# Patient Record
Sex: Male | Born: 1965
Health system: Southern US, Community
[De-identification: ages and names within clinical notes are randomized; demographics above are authoritative.]

## PROBLEM LIST (undated history)

## (undated) DIAGNOSIS — E785 Hyperlipidemia, unspecified: Secondary | ICD-10-CM

## (undated) DIAGNOSIS — E291 Testicular hypofunction: Secondary | ICD-10-CM

## (undated) DIAGNOSIS — M5090 Cervical disc disorder, unspecified, unspecified cervical region: Secondary | ICD-10-CM

## (undated) DIAGNOSIS — I209 Angina pectoris, unspecified: Secondary | ICD-10-CM

## (undated) DIAGNOSIS — R519 Headache, unspecified: Secondary | ICD-10-CM

## (undated) DIAGNOSIS — K219 Gastro-esophageal reflux disease without esophagitis: Secondary | ICD-10-CM

## (undated) DIAGNOSIS — R51 Headache: Secondary | ICD-10-CM

## (undated) DIAGNOSIS — M1990 Unspecified osteoarthritis, unspecified site: Secondary | ICD-10-CM

## (undated) DIAGNOSIS — I499 Cardiac arrhythmia, unspecified: Secondary | ICD-10-CM

## (undated) DIAGNOSIS — G43909 Migraine, unspecified, not intractable, without status migrainosus: Secondary | ICD-10-CM

## (undated) DIAGNOSIS — I10 Essential (primary) hypertension: Secondary | ICD-10-CM

## (undated) DIAGNOSIS — J449 Chronic obstructive pulmonary disease, unspecified: Secondary | ICD-10-CM

## (undated) DIAGNOSIS — R0602 Shortness of breath: Secondary | ICD-10-CM

## (undated) DIAGNOSIS — J45909 Unspecified asthma, uncomplicated: Secondary | ICD-10-CM

## (undated) DIAGNOSIS — E119 Type 2 diabetes mellitus without complications: Secondary | ICD-10-CM

## (undated) DIAGNOSIS — I4892 Unspecified atrial flutter: Secondary | ICD-10-CM

## (undated) HISTORY — DX: Essential (primary) hypertension: I10

## (undated) HISTORY — PX: WISDOM TOOTH EXTRACTION: SHX21

## (undated) HISTORY — DX: Hyperlipidemia, unspecified: E78.5

## (undated) HISTORY — PX: TREATMENT FISTULA ANAL: SUR1390

## (undated) HISTORY — DX: Testicular hypofunction: E29.1

## (undated) HISTORY — DX: Unspecified atrial flutter: I48.92

## (undated) HISTORY — DX: Type 2 diabetes mellitus without complications: E11.9

---
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
## 2000-10-09 ENCOUNTER — Ambulatory Visit (HOSPITAL_COMMUNITY): Admission: RE | Admit: 2000-10-09 | Discharge: 2000-10-09 | Payer: Self-pay | Admitting: *Deleted

## 2000-10-09 ENCOUNTER — Encounter: Payer: Self-pay | Admitting: *Deleted

## 2001-02-22 ENCOUNTER — Ambulatory Visit (HOSPITAL_COMMUNITY): Admission: RE | Admit: 2001-02-22 | Discharge: 2001-02-22 | Payer: Self-pay | Admitting: Family Medicine

## 2001-02-22 ENCOUNTER — Encounter: Payer: Self-pay | Admitting: Family Medicine

## 2003-10-11 ENCOUNTER — Ambulatory Visit (HOSPITAL_COMMUNITY): Admission: RE | Admit: 2003-10-11 | Discharge: 2003-10-11 | Payer: Self-pay | Admitting: Gastroenterology

## 2003-10-27 ENCOUNTER — Ambulatory Visit (HOSPITAL_BASED_OUTPATIENT_CLINIC_OR_DEPARTMENT_OTHER): Admission: RE | Admit: 2003-10-27 | Discharge: 2003-10-27 | Payer: Self-pay | Admitting: Surgery

## 2012-08-17 ENCOUNTER — Encounter (HOSPITAL_COMMUNITY): Payer: Self-pay | Admitting: Pharmacy Technician

## 2012-08-17 ENCOUNTER — Other Ambulatory Visit: Payer: Self-pay | Admitting: Neurosurgery

## 2012-08-23 ENCOUNTER — Encounter (HOSPITAL_COMMUNITY)
Admission: RE | Admit: 2012-08-23 | Discharge: 2012-08-23 | Disposition: A | Discharge status: Home / Self Care | Payer: 59 | Source: Ambulatory Visit | Attending: Neurosurgery | Admitting: Neurosurgery

## 2012-08-25 ENCOUNTER — Encounter (HOSPITAL_COMMUNITY): Admission: RE | Payer: Self-pay | Source: Ambulatory Visit

## 2012-08-25 ENCOUNTER — Ambulatory Visit (HOSPITAL_COMMUNITY): Admission: RE | Admit: 2012-08-25 | Payer: 59 | Source: Ambulatory Visit | Admitting: Neurosurgery

## 2012-08-25 SURGERY — ANTERIOR CERVICAL DECOMPRESSION/DISCECTOMY FUSION 2 LEVELS
Anesthesia: General | Site: Neck

## 2012-08-27 ENCOUNTER — Other Ambulatory Visit: Payer: Self-pay | Admitting: Neurosurgery

## 2012-09-07 ENCOUNTER — Encounter (HOSPITAL_COMMUNITY): Payer: Self-pay | Admitting: Respiratory Therapy

## 2012-09-15 ENCOUNTER — Encounter (HOSPITAL_COMMUNITY): Payer: Self-pay

## 2012-09-15 ENCOUNTER — Encounter (HOSPITAL_COMMUNITY)
Admission: RE | Admit: 2012-09-15 | Discharge: 2012-09-15 | Disposition: A | Discharge status: Home / Self Care | Payer: 59 | Source: Ambulatory Visit | Attending: Neurosurgery | Admitting: Neurosurgery

## 2012-09-15 ENCOUNTER — Ambulatory Visit (HOSPITAL_COMMUNITY)
Admission: RE | Admit: 2012-09-15 | Discharge: 2012-09-15 | Disposition: A | Discharge status: Home / Self Care | Payer: 59 | Source: Ambulatory Visit | Attending: Anesthesiology | Admitting: Anesthesiology

## 2012-09-15 DIAGNOSIS — R9431 Abnormal electrocardiogram [ECG] [EKG]: Secondary | ICD-10-CM | POA: Insufficient documentation

## 2012-09-15 DIAGNOSIS — J4489 Other specified chronic obstructive pulmonary disease: Secondary | ICD-10-CM | POA: Insufficient documentation

## 2012-09-15 DIAGNOSIS — J449 Chronic obstructive pulmonary disease, unspecified: Secondary | ICD-10-CM | POA: Insufficient documentation

## 2012-09-15 DIAGNOSIS — Z01812 Encounter for preprocedural laboratory examination: Secondary | ICD-10-CM | POA: Insufficient documentation

## 2012-09-15 DIAGNOSIS — Z01810 Encounter for preprocedural cardiovascular examination: Secondary | ICD-10-CM | POA: Insufficient documentation

## 2012-09-15 DIAGNOSIS — Z01818 Encounter for other preprocedural examination: Secondary | ICD-10-CM | POA: Insufficient documentation

## 2012-09-15 HISTORY — DX: Chronic obstructive pulmonary disease, unspecified: J44.9

## 2012-09-15 HISTORY — DX: Gastro-esophageal reflux disease without esophagitis: K21.9

## 2012-09-15 LAB — BASIC METABOLIC PANEL
BUN: 14 mg/dL (ref 6–23)
Calcium: 8.8 mg/dL (ref 8.4–10.5)
GFR calc Af Amer: >90 mL/min (ref >90)
GFR calc non Af Amer: >90 mL/min (ref >90)
Potassium: 4 mEq/L (ref 3.5–5.1)

## 2012-09-15 LAB — CBC
Hemoglobin: 15.6 g/dL (ref 13.0–17.0)
MCH: 29.9 pg (ref 26.0–34.0)
MCHC: 37.3 g/dL — ABNORMAL HIGH (ref 30.0–36.0)
Platelets: 228 10*3/uL (ref 150–400)
RDW: 12.4 % (ref 11.5–15.5)

## 2012-09-15 LAB — SURGICAL PCR SCREEN: MRSA, PCR: NEGATIVE

## 2012-09-15 NOTE — Pre-Procedure Instructions (Signed)
BRADIE LACOCK  09/15/2012   Your procedure is scheduled on:  Wednesday, April 9th   Report to [facility] Stay Center at  6:30 AM.  Call this number if you have problems the morning of surgery: [phone]   Remember:   Do not eat food or drink liquids after midnight Tuesday.   Take these medicines the morning of surgery with A SIP OF WATER:  Omeprazole, Inhaler   Do not wear jewelry, no rings, watches, piercings  Do not wear lotions, powders, or colognes. You may NOT wear deodorant.   Men may shave face and neck.   Do not bring valuables to the hospital.  Contacts, dentures or bridgework may not be worn into surgery.   Leave suitcase in the car. After surgery it may be brought to your room.  For patients admitted to the hospital, checkout time is 11:00 AM the day of discharge.   Name and phone number of your driver:    Special Instructions: Shower using CHG 2 nights before surgery and the night before surgery.  If you shower the day of surgery use CHG.  Use special wash - you have one bottle of CHG for all showers.  You should use approximately 1/3 of the bottle for each shower.   Please read over the following fact sheets that you were given: Pain Booklet, Coughing and Deep Breathing, MRSA Information and Surgical Site Infection Prevention

## 2012-09-15 NOTE — Progress Notes (Signed)
Primary Physician - [facility] Does not have a cardiologist  No recent cardiac testing

## 2012-09-15 NOTE — Progress Notes (Signed)
1715  Spoke with Dr. Michelle Piper concerning pt's blood sugar being 212 and no hx of dm...."its ok..we'll check it in the am and run with it....."da

## 2012-09-16 NOTE — Progress Notes (Signed)
Anesthesia chart review: Patient is a 47 year old male scheduled for C5-6, C6-7 ACDF by Dr. Jeral Fruit on 09/22/2012.  History includes obesity, former smoker, COPD, GERD, headaches, anal fistula, wisdom teeth extraction. BP was elevated at PAT (168/98), but there is no documented history of HTN--it was not rechecked at PAT. PCP is with [facility].  EKG on 09/15/12 showed NSR, non-specific T wave abnormality.  CXR on 09/15/12 showed no active cardiopulmonary disease.  Preoperative labs noted.  Non-fasting glucose is 212.  There is no documented history of DM.  Anesthesiologist Dr. Michelle Piper already notified by PAT RN.  Plan to check fasting CBG on arrival and treat if indicated.    Velna Ochs [facility] Stay [facility]/Anesthesiology Phone [phone] 09/16/2012 1:50 PM

## 2012-09-21 MED ORDER — CEFAZOLIN SODIUM-DEXTROSE 2-3 GM-% IV SOLR
2.0000 g | INTRAVENOUS | Status: AC
  Administered 2012-09-22: 2 g via INTRAVENOUS
  Filled 2012-09-21: qty 50

## 2012-09-22 ENCOUNTER — Encounter (HOSPITAL_COMMUNITY): Payer: Self-pay | Admitting: Certified Registered Nurse Anesthetist

## 2012-09-22 ENCOUNTER — Other Ambulatory Visit: Payer: Self-pay | Admitting: Neurosurgery

## 2012-09-22 ENCOUNTER — Encounter (HOSPITAL_COMMUNITY): Payer: Self-pay | Admitting: Vascular Surgery

## 2012-09-22 ENCOUNTER — Ambulatory Visit (HOSPITAL_COMMUNITY): Payer: 59

## 2012-09-22 ENCOUNTER — Observation Stay (HOSPITAL_COMMUNITY)
Admission: RE | Admit: 2012-09-22 | Discharge: 2012-09-23 | DRG: 472 | Disposition: A | Discharge status: Skilled Nursing Facility | Payer: 59 | Source: Ambulatory Visit | Attending: Neurosurgery | Admitting: Neurosurgery

## 2012-09-22 ENCOUNTER — Ambulatory Visit (HOSPITAL_COMMUNITY): Payer: 59 | Admitting: Certified Registered Nurse Anesthetist

## 2012-09-22 ENCOUNTER — Encounter (HOSPITAL_COMMUNITY)
Admission: RE | Disposition: A | Discharge status: Skilled Nursing Facility | Payer: Self-pay | Source: Ambulatory Visit | Attending: Neurosurgery

## 2012-09-22 DIAGNOSIS — Y9210 Unspecified residential institution as the place of occurrence of the external cause: Secondary | ICD-10-CM | POA: Insufficient documentation

## 2012-09-22 DIAGNOSIS — J449 Chronic obstructive pulmonary disease, unspecified: Secondary | ICD-10-CM | POA: Insufficient documentation

## 2012-09-22 DIAGNOSIS — J4489 Other specified chronic obstructive pulmonary disease: Secondary | ICD-10-CM | POA: Insufficient documentation

## 2012-09-22 DIAGNOSIS — Y832 Surgical operation with anastomosis, bypass or graft as the cause of abnormal reaction of the patient, or of later complication, without mention of misadventure at the time of the procedure: Secondary | ICD-10-CM | POA: Insufficient documentation

## 2012-09-22 DIAGNOSIS — M47812 Spondylosis without myelopathy or radiculopathy, cervical region: Principal | ICD-10-CM | POA: Insufficient documentation

## 2012-09-22 DIAGNOSIS — I4892 Unspecified atrial flutter: Secondary | ICD-10-CM

## 2012-09-22 DIAGNOSIS — Z87891 Personal history of nicotine dependence: Secondary | ICD-10-CM | POA: Insufficient documentation

## 2012-09-22 DIAGNOSIS — M4802 Spinal stenosis, cervical region: Secondary | ICD-10-CM | POA: Insufficient documentation

## 2012-09-22 DIAGNOSIS — G43909 Migraine, unspecified, not intractable, without status migrainosus: Secondary | ICD-10-CM | POA: Insufficient documentation

## 2012-09-22 DIAGNOSIS — K219 Gastro-esophageal reflux disease without esophagitis: Secondary | ICD-10-CM | POA: Insufficient documentation

## 2012-09-22 DIAGNOSIS — I519 Heart disease, unspecified: Secondary | ICD-10-CM | POA: Insufficient documentation

## 2012-09-22 DIAGNOSIS — M5020 Other cervical disc displacement, unspecified cervical region: Secondary | ICD-10-CM | POA: Insufficient documentation

## 2012-09-22 HISTORY — DX: Cervical disc disorder, unspecified, unspecified cervical region: M50.90

## 2012-09-22 HISTORY — PX: ANTERIOR CERVICAL DECOMP/DISCECTOMY FUSION: SHX1161

## 2012-09-22 LAB — BASIC METABOLIC PANEL
CO2: 25 mEq/L (ref 19–32)
Calcium: 8.8 mg/dL (ref 8.4–10.5)
GFR calc Af Amer: >90 mL/min (ref >90)
Sodium: 135 mEq/L (ref 135–145)

## 2012-09-22 LAB — GLUCOSE, CAPILLARY: Glucose-Capillary: 172 mg/dL — ABNORMAL HIGH (ref 70–99)

## 2012-09-22 LAB — MAGNESIUM: Magnesium: 1.2 mg/dL — ABNORMAL LOW (ref 1.5–2.5)

## 2012-09-22 LAB — TSH: TSH: 0.51 u[IU]/mL (ref 0.350–4.500)

## 2012-09-22 SURGERY — ANTERIOR CERVICAL DECOMPRESSION/DISCECTOMY FUSION 2 LEVELS
Anesthesia: General | Wound class: Clean

## 2012-09-22 MED ORDER — THROMBIN 20000 UNITS EX SOLR
CUTANEOUS | Status: DC | PRN
  Administered 2012-09-22: 08:00:00 via TOPICAL

## 2012-09-22 MED ORDER — SODIUM CHLORIDE 0.9 % IV SOLN
INTRAVENOUS | Status: DC
  Administered 2012-09-22: 17:00:00 via INTRAVENOUS

## 2012-09-22 MED ORDER — METOPROLOL TARTRATE 25 MG PO TABS
25.0000 mg | ORAL_TABLET | Freq: Four times a day (QID) | ORAL | Status: DC
  Administered 2012-09-22 – 2012-09-23 (×2): 25 mg via ORAL
  Filled 2012-09-22 (×6): qty 1

## 2012-09-22 MED ORDER — MAGNESIUM SULFATE 40 MG/ML IJ SOLN
2.0000 g | Freq: Once | INTRAMUSCULAR | Status: AC
  Administered 2012-09-22: 2 g via INTRAVENOUS
  Filled 2012-09-22: qty 50

## 2012-09-22 MED ORDER — SODIUM CHLORIDE 0.9 % IV SOLN: 250.0000 mL | INTRAVENOUS | Status: DC

## 2012-09-22 MED ORDER — NEOSTIGMINE METHYLSULFATE 1 MG/ML IJ SOLN
INTRAMUSCULAR | Status: DC | PRN
  Administered 2012-09-22: 4 mg via INTRAVENOUS

## 2012-09-22 MED ORDER — PROPOFOL 10 MG/ML IV BOLUS
INTRAVENOUS | Status: DC | PRN
  Administered 2012-09-22: 200 mg via INTRAVENOUS

## 2012-09-22 MED ORDER — MENTHOL 3 MG MT LOZG
1.0000 | LOZENGE | OROMUCOSAL | Status: DC | PRN
  Filled 2012-09-22: qty 9

## 2012-09-22 MED ORDER — MORPHINE SULFATE 2 MG/ML IJ SOLN: 1.0000 mg | INTRAMUSCULAR | Status: DC | PRN

## 2012-09-22 MED ORDER — ESMOLOL HCL 10 MG/ML IV SOLN
INTRAVENOUS | Status: DC | PRN
  Administered 2012-09-22: 30 mg via INTRAVENOUS

## 2012-09-22 MED ORDER — LABETALOL HCL 5 MG/ML IV SOLN
INTRAVENOUS | Status: AC
  Administered 2012-09-22: 10 mg
  Filled 2012-09-22: qty 4

## 2012-09-22 MED ORDER — CEFAZOLIN SODIUM 1-5 GM-% IV SOLN
1.0000 g | Freq: Three times a day (TID) | INTRAVENOUS | Status: AC
  Administered 2012-09-22 – 2012-09-23 (×2): 1 g via INTRAVENOUS
  Filled 2012-09-22 (×2): qty 50

## 2012-09-22 MED ORDER — DILTIAZEM LOAD VIA INFUSION
10.0000 mg | Freq: Once | INTRAVENOUS | Status: AC
  Administered 2012-09-22: 10 mg via INTRAVENOUS

## 2012-09-22 MED ORDER — HYDROMORPHONE HCL PF 1 MG/ML IJ SOLN: 0.2500 mg | INTRAMUSCULAR | Status: DC | PRN

## 2012-09-22 MED ORDER — LABETALOL HCL 5 MG/ML IV SOLN
5.0000 mg | INTRAVENOUS | Status: DC | PRN
  Administered 2012-09-22: 20 mg via INTRAVENOUS
  Administered 2012-09-22 (×2): 10 mg via INTRAVENOUS

## 2012-09-22 MED ORDER — OXYCODONE HCL 5 MG/5ML PO SOLN: 5.0000 mg | Freq: Once | ORAL | Status: DC | PRN

## 2012-09-22 MED ORDER — LIDOCAINE HCL 4 % MT SOLN
OROMUCOSAL | Status: DC | PRN
  Administered 2012-09-22: 4 mL via TOPICAL

## 2012-09-22 MED ORDER — LIDOCAINE HCL (CARDIAC) 20 MG/ML IV SOLN
INTRAVENOUS | Status: DC | PRN
  Administered 2012-09-22: 100 mg via INTRAVENOUS

## 2012-09-22 MED ORDER — DIAZEPAM 5 MG PO TABS
5.0000 mg | ORAL_TABLET | Freq: Four times a day (QID) | ORAL | Status: DC | PRN
  Administered 2012-09-22 (×2): 5 mg via ORAL
  Filled 2012-09-22: qty 1

## 2012-09-22 MED ORDER — FENTANYL CITRATE 0.05 MG/ML IJ SOLN
INTRAMUSCULAR | Status: DC | PRN
  Administered 2012-09-22 (×6): 50 ug via INTRAVENOUS

## 2012-09-22 MED ORDER — ACETAMINOPHEN 325 MG PO TABS: 650.0000 mg | ORAL_TABLET | ORAL | Status: DC | PRN

## 2012-09-22 MED ORDER — DEXAMETHASONE SODIUM PHOSPHATE 4 MG/ML IJ SOLN
4.0000 mg | Freq: Four times a day (QID) | INTRAMUSCULAR | Status: DC
  Filled 2012-09-22 (×7): qty 1

## 2012-09-22 MED ORDER — PHENOL 1.4 % MT LIQD
1.0000 | OROMUCOSAL | Status: DC | PRN
  Filled 2012-09-22: qty 177

## 2012-09-22 MED ORDER — 0.9 % SODIUM CHLORIDE (POUR BTL) OPTIME
TOPICAL | Status: DC | PRN
  Administered 2012-09-22: 1000 mL

## 2012-09-22 MED ORDER — OXYCODONE-ACETAMINOPHEN 5-325 MG PO TABS
ORAL_TABLET | ORAL | Status: AC
  Filled 2012-09-22: qty 2

## 2012-09-22 MED ORDER — SODIUM CHLORIDE 0.9 % IJ SOLN: 3.0000 mL | Freq: Two times a day (BID) | INTRAMUSCULAR | Status: DC

## 2012-09-22 MED ORDER — ONDANSETRON HCL 4 MG/2ML IJ SOLN: 4.0000 mg | INTRAMUSCULAR | Status: DC | PRN

## 2012-09-22 MED ORDER — DIAZEPAM 5 MG PO TABS
ORAL_TABLET | ORAL | Status: AC
  Filled 2012-09-22: qty 1

## 2012-09-22 MED ORDER — DEXAMETHASONE 4 MG PO TABS
4.0000 mg | ORAL_TABLET | Freq: Four times a day (QID) | ORAL | Status: DC
  Administered 2012-09-22 – 2012-09-23 (×4): 4 mg via ORAL
  Filled 2012-09-22 (×7): qty 1

## 2012-09-22 MED ORDER — ROCURONIUM BROMIDE 100 MG/10ML IV SOLN
INTRAVENOUS | Status: DC | PRN
  Administered 2012-09-22: 50 mg via INTRAVENOUS

## 2012-09-22 MED ORDER — OXYCODONE-ACETAMINOPHEN 5-325 MG PO TABS
1.0000 | ORAL_TABLET | ORAL | Status: DC | PRN
  Administered 2012-09-22 (×3): 2 via ORAL
  Filled 2012-09-22 (×2): qty 1
  Filled 2012-09-22: qty 2

## 2012-09-22 MED ORDER — LACTATED RINGERS IV SOLN
INTRAVENOUS | Status: DC | PRN
  Administered 2012-09-22 (×2): via INTRAVENOUS

## 2012-09-22 MED ORDER — METOCLOPRAMIDE HCL 5 MG/ML IJ SOLN: 10.0000 mg | Freq: Once | INTRAMUSCULAR | Status: DC | PRN

## 2012-09-22 MED ORDER — SODIUM CHLORIDE 0.9 % IJ SOLN: 3.0000 mL | INTRAMUSCULAR | Status: DC | PRN

## 2012-09-22 MED ORDER — HEMOSTATIC AGENTS (NO CHARGE) OPTIME
TOPICAL | Status: DC | PRN
  Administered 2012-09-22: 1 via TOPICAL

## 2012-09-22 MED ORDER — ONDANSETRON HCL 4 MG/2ML IJ SOLN
INTRAMUSCULAR | Status: DC | PRN
  Administered 2012-09-22: 4 mg via INTRAVENOUS

## 2012-09-22 MED ORDER — DILTIAZEM HCL 100 MG IV SOLR
5.0000 mg/h | INTRAVENOUS | Status: DC
  Administered 2012-09-22: 5 mg/h via INTRAVENOUS
  Administered 2012-09-22 – 2012-09-23 (×3): 15 mg/h via INTRAVENOUS
  Filled 2012-09-22 (×3): qty 100

## 2012-09-22 MED ORDER — GLYCOPYRROLATE 0.2 MG/ML IJ SOLN
INTRAMUSCULAR | Status: DC | PRN
  Administered 2012-09-22: .6 mg via INTRAVENOUS

## 2012-09-22 MED ORDER — MIDAZOLAM HCL 5 MG/5ML IJ SOLN
INTRAMUSCULAR | Status: DC | PRN
  Administered 2012-09-22: 2 mg via INTRAVENOUS

## 2012-09-22 MED ORDER — THROMBIN 5000 UNITS EX SOLR
CUTANEOUS | Status: DC | PRN
  Administered 2012-09-22: 5000 [IU] via TOPICAL

## 2012-09-22 MED ORDER — VECURONIUM BROMIDE 10 MG IV SOLR
INTRAVENOUS | Status: DC | PRN
  Administered 2012-09-22 (×2): 2 mg via INTRAVENOUS

## 2012-09-22 MED ORDER — ACETAMINOPHEN 650 MG RE SUPP: 650.0000 mg | RECTAL | Status: DC | PRN

## 2012-09-22 MED ORDER — OXYCODONE HCL 5 MG PO TABS: 5.0000 mg | ORAL_TABLET | Freq: Once | ORAL | Status: DC | PRN

## 2012-09-22 SURGICAL SUPPLY — 53 items
BANDAGE GAUZE ELAST BULKY 4 IN (GAUZE/BANDAGES/DRESSINGS) ×4 IMPLANT
BENZOIN TINCTURE PRP APPL 2/3 (GAUZE/BANDAGES/DRESSINGS) ×2 IMPLANT
BIT DRILL SM SPINE QC 14 (BIT) ×2 IMPLANT
BLADE ULTRA TIP 2M (BLADE) ×2 IMPLANT
BUR BARREL STRAIGHT FLUTE 4.0 (BURR) IMPLANT
BUR MATCHSTICK NEURO 3.0 LAGG (BURR) ×2 IMPLANT
CANISTER SUCTION 2500CC (MISCELLANEOUS) ×2 IMPLANT
CLOTH BEACON ORANGE TIMEOUT ST (SAFETY) ×2 IMPLANT
CONT SPEC 4OZ CLIKSEAL STRL BL (MISCELLANEOUS) ×2 IMPLANT
COVER MAYO STAND STRL (DRAPES) ×2 IMPLANT
DRAPE C-ARM 42X72 X-RAY (DRAPES) ×4 IMPLANT
DRAPE LAPAROTOMY 100X72 PEDS (DRAPES) ×2 IMPLANT
DRAPE MICROSCOPE LEICA (MISCELLANEOUS) ×2 IMPLANT
DRAPE POUCH INSTRU U-SHP 10X18 (DRAPES) ×2 IMPLANT
DRAPE PROXIMA HALF (DRAPES) ×2 IMPLANT
DURAPREP 6ML APPLICATOR 50/CS (WOUND CARE) ×2 IMPLANT
ELECT BLADE 4.0 EZ CLEAN MEGAD (MISCELLANEOUS) ×2
ELECT REM PT RETURN 9FT ADLT (ELECTROSURGICAL) ×2
ELECTRODE BLDE 4.0 EZ CLN MEGD (MISCELLANEOUS) ×1 IMPLANT
ELECTRODE REM PT RTRN 9FT ADLT (ELECTROSURGICAL) ×1 IMPLANT
GAUZE SPONGE 4X4 16PLY XRAY LF (GAUZE/BANDAGES/DRESSINGS) IMPLANT
GLOVE BIOGEL M 8.0 STRL (GLOVE) ×2 IMPLANT
GLOVE ECLIPSE 6.5 STRL STRAW (GLOVE) ×2 IMPLANT
GLOVE ECLIPSE 7.5 STRL STRAW (GLOVE) ×6 IMPLANT
GLOVE EXAM NITRILE LRG STRL (GLOVE) IMPLANT
GLOVE EXAM NITRILE MD LF STRL (GLOVE) IMPLANT
GLOVE EXAM NITRILE XL STR (GLOVE) IMPLANT
GLOVE EXAM NITRILE XS STR PU (GLOVE) IMPLANT
GLOVE INDICATOR 8.0 STRL GRN (GLOVE) ×2 IMPLANT
GOWN BRE IMP SLV AUR LG STRL (GOWN DISPOSABLE) ×2 IMPLANT
GOWN BRE IMP SLV AUR XL STRL (GOWN DISPOSABLE) ×2 IMPLANT
GOWN STRL REIN 2XL LVL4 (GOWN DISPOSABLE) IMPLANT
HEAD HALTER (SOFTGOODS) ×2 IMPLANT
HEMOSTAT POWDER KIT SURGIFOAM (HEMOSTASIS) ×2 IMPLANT
KIT BASIN OR (CUSTOM PROCEDURE TRAY) ×2 IMPLANT
KIT ROOM TURNOVER OR (KITS) ×2 IMPLANT
NEEDLE SPNL 22GX3.5 QUINCKE BK (NEEDLE) ×4 IMPLANT
NS IRRIG 1000ML POUR BTL (IV SOLUTION) ×2 IMPLANT
PACK LAMINECTOMY NEURO (CUSTOM PROCEDURE TRAY) ×2 IMPLANT
PATTIES SURGICAL .5 X1 (DISPOSABLE) ×2 IMPLANT
PUTTY BONE GRAFT KIT 2.5ML (Bone Implant) ×2 IMPLANT
RUBBERBAND STERILE (MISCELLANEOUS) ×4 IMPLANT
SCREW XTD VAR 4.2 SELF TAP (Screw) ×12 IMPLANT
SPACER ACDF SM LORDOTIC 7 (Spacer) ×4 IMPLANT
SPONGE GAUZE 4X4 12PLY (GAUZE/BANDAGES/DRESSINGS) ×2 IMPLANT
SPONGE INTESTINAL PEANUT (DISPOSABLE) ×2 IMPLANT
SPONGE SURGIFOAM ABS GEL SZ50 (HEMOSTASIS) ×2 IMPLANT
STRIP CLOSURE SKIN 1/2X4 (GAUZE/BANDAGES/DRESSINGS) ×2 IMPLANT
SUT VIC AB 3-0 SH 8-18 (SUTURE) ×4 IMPLANT
SYR 20ML ECCENTRIC (SYRINGE) ×2 IMPLANT
TOWEL OR 17X24 6PK STRL BLUE (TOWEL DISPOSABLE) ×2 IMPLANT
TOWEL OR 17X26 10 PK STRL BLUE (TOWEL DISPOSABLE) ×2 IMPLANT
WATER STERILE IRR 1000ML POUR (IV SOLUTION) ×2 IMPLANT

## 2012-09-22 NOTE — Transfer of Care (Signed)
Immediate Anesthesia Transfer of Care Note  Patient: [name]  Procedure(s) Performed: Procedure(s) with comments: ANTERIOR CERVICAL DECOMPRESSION/DISCECTOMY FUSION 2 LEVELS (N/A) - Cervical five-six Cervical six-seven Anterior cervical decompression/diskectomy/fusion  Patient Location: PACU  Anesthesia Type:General  Level of Consciousness: awake, alert  and patient cooperative, denies pain.   Airway & Oxygen Therapy: Patient Spontanous Breathing and Patient connected to nasal cannula oxygen  Post-op Assessment: Report given to PACU RN, Post -op Vital signs reviewed and stable and Patient moving all extremities X 4  Post vital signs: Reviewed and stable, pt in stable SVT 120's.  Complications: No apparent anesthesia complications

## 2012-09-22 NOTE — Anesthesia Preprocedure Evaluation (Signed)
Anesthesia Evaluation  Patient identified by MRN, date of birth, ID band Patient awake    Reviewed: Allergy & Precautions, H&P , NPO status , Patient's Chart, lab work & pertinent test results, reviewed documented beta blocker date and time   Airway Mallampati: II TM Distance: >3 FB Neck ROM: full    Dental   Pulmonary COPD COPD inhaler, former smoker,  breath sounds clear to auscultation        Cardiovascular negative cardio ROS  Rhythm:regular     Neuro/Psych  Headaches, negative psych ROS   GI/Hepatic Neg liver ROS, GERD-  Medicated and Controlled,  Endo/Other  negative endocrine ROS  Renal/GU negative Renal ROS  negative genitourinary   Musculoskeletal   Abdominal   Peds  Hematology negative hematology ROS (+)   Anesthesia Other Findings See surgeon's H&P   Reproductive/Obstetrics negative OB ROS                           Anesthesia Physical Anesthesia Plan  ASA: II  Anesthesia Plan: General   Post-op Pain Management:    Induction: Intravenous  Airway Management Planned: Oral ETT  Additional Equipment:   Intra-op Plan:   Post-operative Plan: Extubation in OR  Informed Consent: I have reviewed the patients History and Physical, chart, labs and discussed the procedure including the risks, benefits and alternatives for the proposed anesthesia with the patient or authorized representative who has indicated his/her understanding and acceptance.   Dental Advisory Given  Plan Discussed with: CRNA and Surgeon  Anesthesia Plan Comments:         Anesthesia Quick Evaluation

## 2012-09-22 NOTE — H&P (Signed)
Benjamin Hale is an 47 y.o. male.   Chief Complaint: pain and tingling in both hands as well as migraines.neurontin has beenome help. Mri showed a large spondylosis at c67 with cord displacement and foraminal narrowing at c56. HPI: in view of no improvement he wants to go ahead with surgery. emg was negative.  Past Medical History  Diagnosis Date  . COPD (chronic obstructive pulmonary disease)   . GERD (gastroesophageal reflux disease)   . Headache     migraines    Past Surgical History  Procedure Laterality Date  . Treatment fistula anal    . Wisdom tooth extraction      No family history on file. Social History:  reports that he has quit smoking. He does not have any smokeless tobacco history on file. He reports that he does not drink alcohol or use illicit drugs.  Allergies: No Known Allergies  Medications Prior to Admission  Medication Sig Dispense Refill  . albuterol (PROVENTIL HFA;VENTOLIN HFA) 108 (90 BASE) MCG/ACT inhaler Inhale 2 puffs into the lungs every 6 (six) hours as needed for wheezing.      Marland Kitchen ibuprofen (ADVIL,MOTRIN) 200 MG tablet Take 800 mg by mouth every 6 (six) hours as needed for pain.      Marland Kitchen omeprazole (PRILOSEC) 20 MG capsule Take 20 mg by mouth daily as needed. Acid reflux.        Results for orders placed during the hospital encounter of 09/22/12 (from the past 48 hour(s))  GLUCOSE, CAPILLARY     Status: Abnormal   Collection Time    09/22/12  7:45 AM      Result Value Range   Glucose-Capillary 172 (*) 70 - 99 mg/dL   No results found.  Review of Systems  Constitutional: Negative.   HENT: Positive for neck pain.   Eyes: Negative.   Respiratory: Negative.   Cardiovascular: Negative.        Cancer of lung  Gastrointestinal: Negative.   Genitourinary: Negative.   Skin: Negative.   Neurological: Positive for sensory change and focal weakness.  Endo/Heme/Allergies: Negative.   Psychiatric/Behavioral: Negative.     Blood pressure 149/89,  pulse 73, temperature 97.8 F (36.6 C), temperature source Oral, resp. rate 20, SpO2 98.00%. Physical Exam hent, nl. Neck, pain with mobility.cv, nl. Lungs, clear. Abdomen,soft. extremies nl NEUROWEAKNESS OF RIGHT biceps and wrist extensor.also weakness of hypothenar. tenderneassin elbow with sensory changes in the 4 -5 fingers. Assessment/Plan Patient to have anterior cervical decompression and fusion at 56, 67. Aware of risks and benefits [name] M 09/22/2012, 8:10 AM

## 2012-09-22 NOTE — Anesthesia Procedure Notes (Signed)
Procedure Name: Intubation Date/Time: 09/22/2012 6:39 PM Performed by: Angelica Pou Pre-anesthesia Checklist: Patient identified, Timeout performed, Emergency Drugs available, Suction available and Patient being monitored Patient Re-evaluated:Patient Re-evaluated prior to inductionOxygen Delivery Method: Circle system utilized Preoxygenation: Pre-oxygenation with 100% oxygen Intubation Type: IV induction Ventilation: Mask ventilation without difficulty and Oral airway inserted - appropriate to patient size Laryngoscope Size: Mac and 3 Grade View: Grade I Tube type: Oral Tube size: 7.5 mm Number of attempts: 1 Airway Equipment and Method: Stylet and Oral airway Placement Confirmation: ETT inserted through vocal cords under direct vision,  breath sounds checked- equal and bilateral and positive ETCO2 Secured at: 23 cm Tube secured with: Tape Dental Injury: Teeth and Oropharynx as per pre-operative assessment

## 2012-09-22 NOTE — Anesthesia Postprocedure Evaluation (Signed)
Anesthesia Post Note  Patient: Benjamin Hale  Procedure(s) Performed: Procedure(s) (LRB): ANTERIOR CERVICAL DECOMPRESSION/DISCECTOMY FUSION 2 LEVELS (N/A)  Anesthesia type: General  Patient location: PACU  Post pain: Pain level controlled  Post assessment: Patient's Cardiovascular Status- see below  Last Vitals:  Filed Vitals:   09/22/12 1310  BP: 136/81  Pulse: 125  Temp:   Resp: 17    Post vital signs: Reviewed, Patient referred for treatment of persistent atrial flutter. Post-op follow up per cardiology.  Level of consciousness: alert  Complications: No apparent anesthesia complications

## 2012-09-22 NOTE — Progress Notes (Signed)
Utilization review completed.  

## 2012-09-22 NOTE — Preoperative (Signed)
Beta Blockers   Reason not to administer Beta Blockers:Not Applicable 

## 2012-09-22 NOTE — Progress Notes (Signed)
Dr Justin Mend at bedside, aware of pts HRate  Up and in SVT, will watch pt. For 30 minutes per order

## 2012-09-22 NOTE — Progress Notes (Signed)
Dr.Fredrick aware pt still with increased heart rate , Dr. Jens Som paged to see pt from cardiology

## 2012-09-22 NOTE — Consult Note (Signed)
CARDIOLOGY CONSULT NOTE  Patient ID: Benjamin Hale MRN: [id number], DOB/AGE: 11-29-1965   Admit date: 09/22/2012 Date of Consult: 09/22/2012  Primary Physician: D. Christell Constant, MD - [place] Primary Cardiologist: New to [facility] - seen by B. Crenshaw, MD - pt lives in [place]  Pt. Profile  47 y/o male without prior cardiac history whom we've been asked to eval 2/2 aflutter with rvr.  Problem List  Past Medical History  Diagnosis Date  . COPD (chronic obstructive pulmonary disease)   . GERD (gastroesophageal reflux disease)   . Headache     migraines  . Cervical disc disease     Past Surgical History  Procedure Laterality Date  . Treatment fistula anal    . Wisdom tooth extraction    . Anterior cervical decompression/discectomy fusion 2 levels  09/2012     Allergies  No Known Allergies  HPI   47 y/o male with the above problem list.  He has no cardiac history.  He previously smoked and drank heavily but quit both within the past 7-8 yrs.  He has been told that his BP has been running on the high side by his PCP but has not been started on medication.  He has no h/o chest pain, doe, pnd, orthopnea, palpitations, presyncope, or syncope.  He does have a h/o neck pain and has been eval by Dr. Jeral Fruit for cervical spondylosis.  Pt underwent cervical decompression and discectomy this AM and post-op was noted to be tachycardic on the monitor.  He was asymptomatic.  12 lead ecg shows 2:1 atrial flutter.  Labs are pending.  Inpatient Medications  . diazepam      . oxyCODONE-acetaminophen       Family History Family History  Problem Relation Age of Onset  . Lung cancer Mother     died @ 98  . Brain cancer Father     died @ 50    Social History History   Social History  . Marital Status: Married    Spouse Name: N/A    Number of Children: N/A  . Years of Education: N/A   Occupational History  . Not on file.   Social History Main Topics  . Smoking status: Former Smoker --  2.00 packs/day for 20 years    Quit date: 06/16/2005  . Smokeless tobacco: Not on file  . Alcohol Use: No     Comment: previously drank heavily - quit 8 yrs ago.  . Drug Use: No  . Sexually Active: Yes   Other Topics Concern  . Not on file   Social History Narrative   Lives in [place] with his wife and 73 yr old son.  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    Review of Systems  General:  No chills, fever, night sweats or weight changes.  Cardiovascular:  No chest pain, dyspnea on exertion, edema, orthopnea, palpitations, paroxysmal nocturnal dyspnea. Dermatological: No rash, lesions/masses Respiratory: No cough, dyspnea Urologic: No hematuria, dysuria Abdominal:   No nausea, vomiting, diarrhea, bright red blood per rectum, melena, or hematemesis Neurologic:  In setting of disc dzs, he has had migraines and paresthesias in his hands.  No visual changes, wkns, changes in mental status. MSK:  Acutely, neck hurts/"feels stretched" in post-op setting. All other systems reviewed and are otherwise negative except as noted above.  Physical Exam  Blood pressure 136/81, pulse 125, temperature 98 F (36.7 C), temperature source Oral, resp. rate 17, SpO2 95.00%.  General: Pleasant,  NAD Psych: Normal affect. Neuro: Alert and oriented X 3. Moves all extremities spontaneously. HEENT: Normal  Neck: Supple without bruits or JVD. Lungs:  Resp regular and unlabored, CTA. Heart: RRR, tachy, no s3, s4, or murmurs. Abdomen: Soft, non-tender, non-distended, BS + x 4.  Extremities: No clubbing, cyanosis or edema. DP/PT/Radials 2+ and equal bilaterally.  Labs  Bmet, Mg, TSH pending.  Radiology/Studies  Dg Chest 2 View  09/15/2012  *RADIOLOGY REPORT*  Clinical Data: Preop neck surgery  CHEST - 2 VIEW   IMPRESSION: No active cardiopulmonary disease.   Original Report Authenticated By: Janeece Riggers, M.D.    Dg Cervical Spine Complete  09/22/2012  *RADIOLOGY REPORT*  Clinical  Data: ACDF C5 - C6; C6 - C7  CERVICAL SPINE - COMPLETE 4+ VIEW  Comparison: Cervical spine radiographs - 07/26/2012; cervical spine MRI - 08/05/2012  Findings:   IMPRESSION: Post C5 - C6 and C6 - C7 ACDF and intervertebral disc space replacement.   Original Report Authenticated By: Tacey Ruiz, MD    ECG  Aflutter, 126, no acute st/t changes.  ASSESSMENT AND PLAN  1.  Atrial flutter w/ rvr:  Currently asymptomatic with rate of 126 in post-op setting.  He will require a tele bed and we will check lytes, Mg, TSH, and echo.  Add IV dilt for rate control and hopefully he will convert this afternoon.  If not, we will pursue cardioversion in the AM.  Being s/p cervical disc surgery, he is not an ideal candidate for anticoagulation at this time.  CHA2DS2VASc = 0 (though he says BP has been trending up recently).  2.  Cervical disc dzs s/p decompression/discectomy:  Per NSU.  Signed, Nicolasa Ducking, NP 09/22/2012, 1:45 PM  As above, patient seen and examined. Briefly he is a 47 year old male with past medical history of borderline hypertension, COPD and now status post neck surgery who I last evaluated for new-onset atrial flutter. No cardiac history. Had cervical disc surgery today and postoperatively developed atrial flutter with a rapid ventricular response. Patient denies chest pain, palpitations or dyspnea. Plan echocardiogram and TSH. Atrial flutter most likely related to hyperadrenergic state postoperatively. Begin Cardizem for rate control. Hopefully he will convert on his own. If he doesn't we will most likely proceed with cardioversion tomorrow morning. I will not add anticoagulant at this point given his recent surgery. If he converts and holds sinus rhythm I do not think he will require aspirin or anticoagulation long-term.  Olga Millers 2:09 PM

## 2012-09-22 NOTE — Progress Notes (Unsigned)
Op note [phone]

## 2012-09-23 ENCOUNTER — Encounter (HOSPITAL_COMMUNITY): Payer: Self-pay | Admitting: Anesthesiology

## 2012-09-23 ENCOUNTER — Inpatient Hospital Stay (HOSPITAL_COMMUNITY): Payer: 59 | Admitting: Anesthesiology

## 2012-09-23 ENCOUNTER — Encounter (HOSPITAL_COMMUNITY)
Admission: RE | Disposition: A | Discharge status: Skilled Nursing Facility | Payer: Self-pay | Source: Ambulatory Visit | Attending: Neurosurgery

## 2012-09-23 DIAGNOSIS — I517 Cardiomegaly: Secondary | ICD-10-CM

## 2012-09-23 DIAGNOSIS — I4892 Unspecified atrial flutter: Secondary | ICD-10-CM

## 2012-09-23 HISTORY — PX: CARDIOVERSION: SHX1299

## 2012-09-23 LAB — MAGNESIUM: Magnesium: 2.1 mg/dL (ref 1.5–2.5)

## 2012-09-23 SURGERY — CARDIOVERSION
Anesthesia: General | Wound class: Clean

## 2012-09-23 MED ORDER — SODIUM CHLORIDE 0.9 % IJ SOLN: 3.0000 mL | INTRAMUSCULAR | Status: DC | PRN

## 2012-09-23 MED ORDER — PROPOFOL 10 MG/ML IV BOLUS
INTRAVENOUS | Status: DC | PRN
  Administered 2012-09-23: 100 mg via INTRAVENOUS

## 2012-09-23 MED ORDER — DILTIAZEM HCL ER COATED BEADS 180 MG PO CP24
180.0000 mg | ORAL_CAPSULE | Freq: Every day | ORAL | Status: DC
  Administered 2012-09-23: 180 mg via ORAL
  Filled 2012-09-23: qty 1

## 2012-09-23 MED ORDER — SODIUM CHLORIDE 0.9 % IV SOLN
250.0000 mL | INTRAVENOUS | Status: DC
  Administered 2012-09-23: 250 mL via INTRAVENOUS

## 2012-09-23 MED ORDER — OFF THE BEAT BOOK
Freq: Once | Status: AC
  Administered 2012-09-23: 08:00:00
  Filled 2012-09-23: qty 1

## 2012-09-23 MED ORDER — SODIUM CHLORIDE 0.9 % IJ SOLN: 3.0000 mL | Freq: Two times a day (BID) | INTRAMUSCULAR | Status: DC

## 2012-09-23 MED ORDER — DILTIAZEM HCL ER COATED BEADS 180 MG PO CP24: 180.0000 mg | ORAL_CAPSULE | Freq: Every day | ORAL | Status: DC

## 2012-09-23 NOTE — Progress Notes (Signed)
Reviewed discharge instructions with patient and wife, they stated their understanding.  Patient discharged home with wife.  Benjamin Hale Danielle  

## 2012-09-23 NOTE — Progress Notes (Signed)
Patient ID: Benjamin Hale, male   DOB: 12-Jun-1966, 47 y.o.   MRN: [id number] Doing wel,l from the standpoint of cervical spine. No weakness. Wound dry. For cardioversion this am

## 2012-09-23 NOTE — Progress Notes (Signed)
Pt. HR sustaining Aflutter 130's-140's. Hurman Horn PA notified.  Order received for 10 mg IV cardizem bolus and to continue cardizem drip @15mg /hr.  Pt. Mg. level also noted to be 1.2 and PA notified of this as well.  Order received for IV magnesium.  BP 152/62 HR 132 O2 sat 94%RA RR 20.  Pt. Denies any SOB, CP or palpitations at this time.  After cardizem bolus patient's HR decreased to 90's.  Will continue to monitor patient.

## 2012-09-23 NOTE — Progress Notes (Signed)
Pt HR maintaining 90-100's after metoprolol given.  BP 143/72 O2 92%RA RR 18.  Will continue to monitor.

## 2012-09-23 NOTE — Brief Op Note (Signed)
09/22/2012 - 09/23/2012  12:20 PM  PATIENT:  Benjamin Hale  47 y.o. male  PRE-OPERATIVE DIAGNOSIS:  Atrial flutter   Patient sedated by anesthesia with propofol  With pads in AP position patient cardioverted to SR with 150J synchronized biphasic energy. Procedure without complication.

## 2012-09-23 NOTE — Progress Notes (Signed)
  Echocardiogram 2D Echocardiogram has been performed.  Benjamin Hale FRANCES 09/23/2012, 3:00 PM

## 2012-09-23 NOTE — Op Note (Signed)
Benjamin Hale, Benjamin Hale NO.:  [id number]  MEDICAL RECORD NO.:  [id number]  LOCATION:  3W22C                        FACILITY:  [facility]  PHYSICIAN:  Hilda Lias, M.D.   DATE OF BIRTH:  03/26/1966  DATE OF PROCEDURE:  09/22/2012 DATE OF DISCHARGE:                              OPERATIVE REPORT   PREOPERATIVE DIAGNOSIS:  C5-6, C6-7 spondylosis with radiculopathy going to the left arm associated with weakness.  POSTOPERATIVE DIAGNOSIS:  C5-6, C6-7 spondylosis with radiculopathy going to the left arm associated with weakness.  PROCEDURE:  Anterior 5-6, 6-7 diskectomy, decompression of the spinal cord, bilateral foraminotomy, interbody fusion with cages, plate, microscope.  SURGEON:  Hilda Lias, M.D.  ASSISTANT:  _cABBELL_________  CLINICAL HISTORY:  The patient is a 47 year old gentleman complaining of neck pain worsened to the left upper extremity.  He has failed conservative treatment.  X-rays showed spondylosis with a herniated disk at L5-6, 6-7 with displacement of spinal cord.  Surgery was advised. The risks were fully explained to him and his family.  PROCEDURE:  The patient was taken to the OR, and after intubation, the left side of the neck was cleaned with DuraPrep and drapes were applied. Then, midline incision was made through the skin, subcutaneous tissue, platysma, straight down to the cervical spine.  X-rays showed that indeed we were right at the level of 5-6.  From then on, we found that the patient has quite a bit of soft anterior ligament, which were removed at those 2 levels.  We entered the disk space and the disk was quite soft and the bone also were quite soft and quite vascularized. Total diskectomy was accomplished with opening of the posterior ligament and decompression of not only the cord, but both C6 nerve root.  At the level of 5-6, 6-7, what we found mostly in the left side was fracture of the endplate with compromise of the  lateral aspect of the cord as well as the nerve root.  Decompression was achieved at those 2 levels.  Then, the endplate were drilled and 2 cages of 7 mm, lordotic with autograft and bone extensor were inserted followed by a plate using 6 screws. Lateral C-spine showed good position of the cages and the plate.  The area was irrigated.  We waited 10 minutes just to be sure that we had good hemostasis.  Once this was accomplished, it was closed with Vicryl.         ______________________________ Hilda Lias, M.D.    EB/MEDQ  D:  09/22/2012  T:  09/22/2012  Job:  [id number]

## 2012-09-23 NOTE — Progress Notes (Signed)
Referral received today for SNF placement. Full assessment to follow.   Sherald Barge, LCSW-A Clinical Social Worker [phone]

## 2012-09-23 NOTE — Progress Notes (Signed)
Pt c/o difficulty voiding.  Pt. Voided 50cc urine and c/o feeling urinary retention.  Pt. Bladder scanned after voiding and noted patient to have 200 cc urine in bladder.  Pt. In and out cath'd per order using aseptic technique.  Pt. Tolerated well.  950cc straw colored urine obtained.  Will continue to monitor at this time.

## 2012-09-23 NOTE — Anesthesia Postprocedure Evaluation (Signed)
  Anesthesia Post-op Note  Patient: Benjamin Hale  Procedure(s) Performed: Procedure(s): CARDIOVERSION (N/A)  Patient Location: PACU and Nursing Unit  Anesthesia Type:General  Level of Consciousness: awake and alert   Airway and Oxygen Therapy: Patient Spontanous Breathing and Patient connected to nasal cannula oxygen  Post-op Pain: none  Post-op Assessment: Post-op Vital signs reviewed, Patient's Cardiovascular Status Stable and Respiratory Function Stable  Post-op Vital Signs: Reviewed and stable  Complications: No apparent anesthesia complications

## 2012-09-23 NOTE — Progress Notes (Addendum)
OK for discharge from cardiology standpoint - Dr. Cassandria Santee office made aware.   - Would recommend to continue diltiazem at discharge as he is on now (was switched this afternoon - does not need both metoprolol and diltiazem so we discontinued metoprolol) - Follow up 10/07/12 at 9:50am Tereso Newcomer PA-C (appt put in Epic) - please call with questions  Jamaurie Bernier PA-C

## 2012-09-23 NOTE — Discharge Summary (Signed)
Physician Discharge Summary  Patient ID: Benjamin Hale MRN: [id number] DOB/AGE: Apr 16, 1966 47 y.o.  Admit date: 09/22/2012 Discharge date: 09/23/2012  Admission Diagnoses:cervical 56,67 stenosis  Discharge Diagnoses: same, plus Active Problems:   Atrial flutter   Discharged Condition:stable  Hospital Course: cervical fusion,developed atrial flutter postop  Consultscardiloly  Significant Diagnostic Studies:mri   Treatments:surgical fusion. cardioversion  Discharge Exam: Blood pressure 133/67, pulse 82, temperature 97.9 F (36.6 C), temperature source Oral, resp. rate 16, height 5\' 10"  (1.778 m), weight 112.946 kg (249 lb), SpO2 95.00%. No weakness. Wound dry  Dispositionok to be dc as per cardiology   Future Appointments Provider Department Dept Phone   10/07/2012 9:50 AM Beatrice Lecher, PA-C [facility] Main [facility] [place]) [phone]       Medication List    ASK your doctor about these medications       albuterol 108 (90 BASE) MCG/ACT inhaler  Commonly known as:  PROVENTIL HFA;VENTOLIN HFA  Inhale 2 puffs into the lungs every 6 (six) hours as needed for wheezing.     ibuprofen 200 MG tablet  Commonly known as:  ADVIL,MOTRIN  Take 800 mg by mouth every 6 (six) hours as needed for pain.     omeprazole 20 MG capsule  Commonly known as:  PRILOSEC  Take 20 mg by mouth daily as needed. Acid reflux.           Follow-up Information   Follow up with Tereso Newcomer, PA-C. (10/07/12 at 9:50am)    Contact information:   [address] [place] [address] [phone] [facility]       Signed: Karn Cassis 09/23/2012, 4:14 PM

## 2012-09-23 NOTE — Anesthesia Preprocedure Evaluation (Addendum)
Anesthesia Evaluation  Patient identified by MRN, date of birth, ID band Patient awake    Reviewed: Allergy & Precautions, H&P , NPO status , Patient's Chart, lab work & pertinent test results  Airway Mallampati: II TM Distance: >3 FB Neck ROM: Limited   Comment: S/p ACDF yesterday Dental  (+) Teeth Intact   Pulmonary COPDformer smoker,          Cardiovascular + dysrhythmias Atrial Fibrillation Rhythm:Irregular     Neuro/Psych    GI/Hepatic GERD-  Controlled,  Endo/Other    Renal/GU      Musculoskeletal   Abdominal   Peds  Hematology   Anesthesia Other Findings   Reproductive/Obstetrics                           Anesthesia Physical Anesthesia Plan  ASA: II  Anesthesia Plan: General   Post-op Pain Management:    Induction: Intravenous  Airway Management Planned: Mask  Additional Equipment:   Intra-op Plan:   Post-operative Plan:   Informed Consent:   Plan Discussed with:   Anesthesia Plan Comments:         Anesthesia Quick Evaluation

## 2012-09-23 NOTE — Transfer of Care (Signed)
Immediate Anesthesia Transfer of Care Note  Patient: Benjamin Hale  Procedure(s) Performed: Procedure(s): CARDIOVERSION (N/A)  Patient Location: PACU and Nursing Unit  Anesthesia Type:General  Level of Consciousness: awake and alert   Airway & Oxygen Therapy: Patient connected to nasal cannula oxygen  Post-op Assessment: Report given to PACU RN  Post vital signs: Reviewed and stable  Complications: No apparent anesthesia complications

## 2012-09-23 NOTE — Progress Notes (Signed)
Referral received today for SNF placement. Met with pt to discuss this referral and the pt and his wife stated that he will not be going to a SNF, he will be going home. No further CSW needs.  Sherald Barge, LCSW-A Clinical Social Worker [phone]

## 2012-09-23 NOTE — Evaluation (Addendum)
Occupational Therapy Evaluation Patient Details Name: Benjamin Hale MRN: [id number] DOB: 1965-07-21 Today's Date: 09/23/2012 Time: 6440-3474 OT Time Calculation (min): 11 min  OT Assessment / Plan / Recommendation Clinical Impression  47yo male s/p ACDF that does not require OT at this time. OT to sign off    OT Assessment  Patient does not need any further OT services    Follow Up Recommendations  No OT follow up    Barriers to Discharge      Equipment Recommendations  None recommended by OT    Recommendations for Other Services    Frequency       Precautions / Restrictions Precautions Precautions: Cervical Precaution Comments: handout provided to wife   Pertinent Vitals/Pain No pain reported    ADL  Grooming: Wash/dry hands;Independent Where Assessed - Grooming: Unsupported standing Lower Body Dressing: Independent Where Assessed - Lower Body Dressing: Unsupported sit to stand Toilet Transfer: Independent Toilet Transfer Method: Sit to stand Toilet Transfer Equipment: Regular height toilet Transfers/Ambulation Related to ADLs: Pt ambulated independent ADL Comments: pt with no numbness or tingling in bil Ue. Pt educated on daily task and rountines that could be completed that improves hand strength and coordination. Pt is near or close to baseline.    OT Diagnosis:    OT Problem List:   OT Treatment Interventions:     OT Goals    Visit Information  Last OT Received On: 09/23/12 Assistance Needed: +1    Subjective Data  Subjective: " I was hoping someone come talk to me about this" Patient Stated Goal: to go home soon   Prior Functioning     Home Living Lives With: Spouse;Other (Comment) (kids) Available Help at Discharge: Family Type of Home: House Bathroom Toilet: Standard Prior Function Level of Independence: Independent Able to Take Stairs?: Yes Communication Communication: No difficulties Dominant Hand: Right          Vision/Perception Vision - History Baseline Vision: No visual deficits Patient Visual Report: No change from baseline   Cognition  Cognition Overall Cognitive Status: Appears within functional limits for tasks assessed/performed Arousal/Alertness: Awake/alert Orientation Level: Appears intact for tasks assessed Behavior During Session: [facility] for tasks performed    Extremity/Trunk Assessment Right Upper Extremity Assessment RUE ROM/Strength/Tone: Within functional levels RUE Sensation: WFL - Light Touch RUE Coordination: WFL - gross/fine motor Left Upper Extremity Assessment LUE ROM/Strength/Tone: Within functional levels LUE Sensation: WFL - Light Touch LUE Coordination: WFL - gross/fine motor Trunk Assessment Trunk Assessment: Normal     Mobility Bed Mobility Bed Mobility: Supine to Sit;Sitting - Scoot to Edge of Bed;Sit to Supine Supine to Sit: 7: Independent Sitting - Scoot to Edge of Bed: 7: Independent Sit to Supine: 7: Independent Details for Bed Mobility Assistance: educated on log rolling Transfers Transfers: Sit to Stand;Stand to Sit Sit to Stand: 7: Independent Stand to Sit: 7: Independent     Exercise     Balance     End of Session OT - End of Session Activity Tolerance: Patient tolerated treatment well Patient left: Other (comment) (w/c to transport to echo) Nurse Communication: Mobility status;Precautions  GO     Lucile Shutters 09/23/2012, 4:41 PM Pager: [phone]

## 2012-09-23 NOTE — Progress Notes (Signed)
PT Evaluation Cancellation Note--@@@Sign  off from PT@@@  Patient Details Name: Benjamin Hale MRN: [id number] DOB: 20-Sep-1965   Cancelled Treatment:    Reason Eval/Treat Not Completed: Other (comment) (All education completed by OT, no needs for PT) 09/23/2012  [place] Bing, PT [phone] (pager)  Veleta Yamamoto, Eliseo Gum 09/23/2012, 4:20 PM

## 2012-09-23 NOTE — Preoperative (Signed)
Beta Blockers   Reason not to administer Beta Blockers:Not Applicable 

## 2012-09-23 NOTE — Progress Notes (Signed)
Inpatient Diabetes Program Recommendations  AACE/ADA: New Consensus Statement on Inpatient Glycemic Control (2013)  Target Ranges:  Prepandial:   less than 140 mg/dL      Peak postprandial:   less than 180 mg/dL (1-2 hours)      Critically ill patients:  140 - 180 mg/dL    Results for BERNON, ARVISO (MRN [id number]) as of 09/23/2012 11:14  Ref. Range 09/22/2012 14:10  Glucose Latest Range: 70-99 mg/dL 409 (H)    Lab glucose elevated.  Patient receiving Decadron steroids.  MD- Please check CBGs and cover with Novolog Sensitive correction scale (SSI) tid ac + HS if elevated.  Will follow. Ambrose Finland RN, MSN, CDE Diabetes Coordinator Inpatient Diabetes Program [phone]

## 2012-09-23 NOTE — Progress Notes (Signed)
Occupational Therapy Discharge Patient Details Name: Benjamin Hale MRN: [id number] DOB: 05-25-1966 Today's Date: 09/23/2012 Time:  -     Patient discharged from OT services secondary to goals met and no further OT needs identified.  Please see latest therapy progress note for current level of functioning and progress toward goals.    Progress and discharge plan discussed with patient and/or caregiver: Patient/Caregiver agrees with plan  GO    Lucile Shutters   OTR/L Pager: [phone] Office: [phone] .   Harrel Carina [facility] 09/23/2012, 4:18 PM

## 2012-09-23 NOTE — Progress Notes (Signed)
   Subjective:  Denies CP or dyspnea   Objective:  Filed Vitals:   09/22/12 2102 09/22/12 2336 09/23/12 0000 09/23/12 0400  BP: 154/92 152/90 156/86 117/76  Pulse: 111 130 136 84  Temp: 99.2 F (37.3 C)  98.9 F (37.2 C) 98.5 F (36.9 C)  TempSrc: Oral   Oral  Resp:   16   Height:      Weight:    249 lb (112.946 kg)  SpO2: 95%  94% 92%    Intake/Output from previous day:  Intake/Output Summary (Last 24 hours) at 09/23/12 0711 Last data filed at 09/22/12 1734  Gross per 24 hour  Intake   1880 ml  Output    150 ml  Net   1730 ml    Physical Exam: Physical exam: Well-developed well-nourished in no acute distress.  Skin is warm and dry.  HEENT is normal.  Neck cervical collar in place; s/p surgery Chest is clear to auscultation with normal expansion.  Cardiovascular exam is irregular Abdominal exam nontender or distended. No masses palpated. Extremities show no edema. neuro grossly intact    Lab Results: Basic Metabolic Panel:  Recent Labs  16/10/96 1410 09/22/12 1716 09/23/12 0529  NA 135  --   --   K 3.8  --   --   CL 97  --   --   CO2 25  --   --   GLUCOSE 246*  --   --   BUN 14  --   --   CREATININE 0.84  --   --   CALCIUM 8.8  --   --   MG  --  1.2* 2.1     Assessment/Plan:  1 postoperative atrial flutter-patient remains in flutter this a.m. Duration is less than 24 hours. Plan to proceed with cardioversion. TSH is normal. Await echocardiogram. Patient can be discharged following cardioversion if he holds sinus rhythm. Would not anticoagulate given recent surgery. Patient's Chads score question 1 for borderline hypertension. 2 status post cervical disc surgery-management per surgery.  Olga Millers 09/23/2012, 7:11 AM

## 2012-09-23 NOTE — Progress Notes (Signed)
Pt. HR sustaining 140's.  BP 158/72 O2 93% RA RR 18.  Pt. Denies SOB, CP, palpitations.  States "I'm tired."  MD notified.  Order received for metoprolol 25 mg q 6 hrs. Dose given per order.  Will continue to monitor patient

## 2012-09-24 ENCOUNTER — Encounter (HOSPITAL_COMMUNITY): Payer: Self-pay | Admitting: Cardiology

## 2012-09-24 NOTE — Care Management Note (Signed)
    Page 1 of 1   09/24/2012     10:14:26 AM   CARE MANAGEMENT NOTE 09/24/2012  Patient:  Benjamin Hale, Benjamin Hale   Account Number:  [id number]  Date Initiated:  09/23/2012  Documentation initiated by:  Donn Pierini  Subjective/Objective Assessment:   Pt admitted s/p cervical surg. and afib post op- now s/p cardioversion     Action/Plan:   PTA pt lived at home   Anticipated DC Date:  09/23/2012   Anticipated DC Plan:  HOME/SELF CARE         Choice offered to / List presented to:             Status of service:  Completed, signed off Medicare Important Message given?   (If response is "NO", the following Medicare IM given date fields will be blank) Date Medicare IM given:   Date Additional Medicare IM given:    Discharge Disposition:  HOME/SELF CARE  Per UR Regulation:  Reviewed for med. necessity/level of care/duration of stay  If discussed at Long Length of Stay Meetings, dates discussed:    Comments:  09/23/12 1700- Donn Pierini RN, BSN [phone] Referral received for [facility] needs- pt to d/c home today with no HH needs noted. Per OT notes no recommendations made and pt states that he does not need any f/u at home at discharge.

## 2012-09-27 ENCOUNTER — Telehealth: Payer: Self-pay | Admitting: *Deleted

## 2012-09-27 NOTE — Telephone Encounter (Signed)
TCM Patient:  Pt states he is doing fine, picked up his meds.  Aware of follow up appt.

## 2012-10-07 ENCOUNTER — Ambulatory Visit (INDEPENDENT_AMBULATORY_CARE_PROVIDER_SITE_OTHER): Payer: 59 | Admitting: Physician Assistant

## 2012-10-07 ENCOUNTER — Encounter: Payer: Self-pay | Admitting: Physician Assistant

## 2012-10-07 VITALS — BP 152/90 | HR 57 | Ht 70.0 in | Wt 232.8 lb

## 2012-10-07 DIAGNOSIS — I4892 Unspecified atrial flutter: Secondary | ICD-10-CM

## 2012-10-07 DIAGNOSIS — I10 Essential (primary) hypertension: Secondary | ICD-10-CM

## 2012-10-07 DIAGNOSIS — M47812 Spondylosis without myelopathy or radiculopathy, cervical region: Secondary | ICD-10-CM | POA: Insufficient documentation

## 2012-10-07 MED ORDER — ASPIRIN EC 81 MG PO TBEC: 81.0000 mg | DELAYED_RELEASE_TABLET | Freq: Every day | ORAL | Status: DC

## 2012-10-07 MED ORDER — LISINOPRIL 10 MG PO TABS: 10.0000 mg | ORAL_TABLET | Freq: Every day | ORAL | Status: DC

## 2012-10-07 NOTE — Progress Notes (Signed)
[address] [place], [place]  [address] Phone: [phone] Fax:  [phone]  Date:  10/07/2012   ID:  Benjamin Hale, DOB 10-09-1965, MRN [id number]  PCP:  Pcp Not In System  Primary Cardiologist:  Dr. Olga Millers     History of Present Illness: Benjamin Hale is a 47 y.o. male who returns for f/u after a recent admission to the hospital.  He has a hx of COPD and GERD. He developed AFlutter with RVR after cervical decompression and discectomy for cervical spondylosis.  He was rate controlled with IV diltiazem.  CHADS2-VASc=0.  He did not convert on his own and underwent DCCV within 24 hours of onset.  Echo 09/2012:  Mild LVH, EF 60-65%, Gr 2 DD.  Since d/c, he is doing well.  His neck and upper chest is sore since his surgery.  He sees the neurosurgeon in 2 weeks.  No significant dyspnea.  No syncope.  No palpitations.  No orthopnea, PND, edema.  BPs at home have been ranging 140-160s systolic.  He notes this was what his BP was prior to his surgery as well.    Labs (4/14):  K 3.8, Cr 0.84, Hgb 15.6, TSH 0.510  Wt Readings from Last 3 Encounters:  10/07/12 232 lb 12.8 oz (105.597 kg)  09/23/12 249 lb (112.946 kg)  09/23/12 249 lb (112.946 kg)     Past Medical History  Diagnosis Date  . COPD (chronic obstructive pulmonary disease)   . GERD (gastroesophageal reflux disease)   . Headache     migraines  . Cervical disc disease     s/p cervical spine surgery 4/14  . Atrial flutter     a. s/p cervical spine surgery 4/14 => s/p DCCV;  b.  Echo 09/2012:  Mild LVH, EF 60-65%, Gr 2 DD.  Marland Kitchen HTN (hypertension)     Current Outpatient Prescriptions  Medication Sig Dispense Refill  . albuterol (PROVENTIL HFA;VENTOLIN HFA) 108 (90 BASE) MCG/ACT inhaler Inhale 2 puffs into the lungs every 6 (six) hours as needed for wheezing.      . diazepam (VALIUM) 5 MG tablet Take 5 mg by mouth every 6 (six) hours as needed.       . diltiazem (CARDIZEM CD) 180 MG 24 hr  capsule Take 1 capsule (180 mg total) by mouth daily.  30 capsule  6  . ENDOCET 7.5-325 MG per tablet Take 1 tablet by mouth at bedtime.       Marland Kitchen omeprazole (PRILOSEC) 20 MG capsule Take 20 mg by mouth daily as needed. Acid reflux.       No current facility-administered medications for this visit.    Allergies:   No Known Allergies  Social History:  The patient  reports that he quit smoking about 7 years ago. He does not have any smokeless tobacco history on file. He reports that he does not drink alcohol or use illicit drugs.   ROS:  Please see the history of present illness.  Denies snoring.   All other systems reviewed and negative.   PHYSICAL EXAM: VS:  BP 152/90  Pulse 57  Ht 5\' 10"  (1.778 m)  Wt 232 lb 12.8 oz (105.597 kg)  BMI 33.4 kg/m2 Well nourished, well developed, in no acute distress HEENT: normal Neck: C-collar in place Cardiac:  normal S1, S2; RRR; no murmur Lungs:  clear to auscultation bilaterally, no wheezing, rhonchi or rales Abd: soft, nontender, no hepatomegaly Ext: no edema Skin: warm and dry  Neuro:  CNs 2-12 intact, no focal abnormalities noted  EKG:  Sinus brady, HR 56, NSSTTW changes     ASSESSMENT AND PLAN:  1. Atrial Flutter:  Maintaining NSR.  BPs have been consistently high.  He has LVH and diastolic dysfunction on echo.  He has HTN.  CHADS2=1.  But his AFlutter was in the setting of surgery.  Would not start chronic anticoagulation unless he has recurrent atrial arrhythmias.  He sees neurosurgery in 2 weeks.  If OK with NS at that time, start ASA 81 mg QD. 2. Hypertension:  Start Lisinopril 10 mg QD.  Check BMET in 1 week. 3. Cervical Spondylosis, s/p Surgery:  F/u with NS as planned. 4. Disposition:  F/u with Dr. Olga Millers 2 mos.  Signed, Tereso Newcomer, PA-C  10:02 AM 10/07/2012

## 2012-10-07 NOTE — Patient Instructions (Addendum)
START LISINOPRIL 10 MG DAILY; RX SENT IN TODAY  ASK SURGEON IF OK TO START ASPIRIN 81 MG IF OK PLEASE START  LAB BMET IN 1 WEEK 10/14/12  PLEASE FOLLOW UP WITH DR. CRENSHAW IN 2 MONTHS

## 2012-10-14 ENCOUNTER — Other Ambulatory Visit (INDEPENDENT_AMBULATORY_CARE_PROVIDER_SITE_OTHER): Payer: 59

## 2012-10-14 DIAGNOSIS — I10 Essential (primary) hypertension: Secondary | ICD-10-CM

## 2012-10-14 DIAGNOSIS — I4892 Unspecified atrial flutter: Secondary | ICD-10-CM

## 2012-10-14 LAB — BASIC METABOLIC PANEL
GFR: 104.3 mL/min (ref >60.00)
Glucose, Bld: 272 mg/dL — ABNORMAL HIGH (ref 70–99)
Potassium: 3.9 mEq/L (ref 3.5–5.1)
Sodium: 132 mEq/L — ABNORMAL LOW (ref 135–145)

## 2012-10-18 ENCOUNTER — Telehealth: Payer: Self-pay | Admitting: *Deleted

## 2012-10-18 NOTE — Telephone Encounter (Signed)
pt notified about lab results w/verbal understanding today and asked for copy of lab to be mailed to him. I said not a problem and I will put in the mail today, pt said thank you

## 2012-11-10 ENCOUNTER — Emergency Department (HOSPITAL_COMMUNITY)
Admission: EM | Admit: 2012-11-10 | Discharge: 2012-11-10 | Discharge status: Against Medical Advice | Payer: 59 | Attending: Emergency Medicine | Admitting: Emergency Medicine

## 2012-11-10 ENCOUNTER — Encounter (HOSPITAL_COMMUNITY): Payer: Self-pay | Admitting: *Deleted

## 2012-11-10 ENCOUNTER — Other Ambulatory Visit: Payer: Self-pay

## 2012-11-10 ENCOUNTER — Ambulatory Visit (INDEPENDENT_AMBULATORY_CARE_PROVIDER_SITE_OTHER): Payer: 59 | Admitting: Nurse Practitioner

## 2012-11-10 ENCOUNTER — Encounter: Payer: Self-pay | Admitting: Nurse Practitioner

## 2012-11-10 VITALS — BP 137/78 | HR 56 | Temp 99.2°F | Ht 70.0 in | Wt 232.0 lb

## 2012-11-10 DIAGNOSIS — R7309 Other abnormal glucose: Secondary | ICD-10-CM

## 2012-11-10 DIAGNOSIS — J4489 Other specified chronic obstructive pulmonary disease: Secondary | ICD-10-CM | POA: Insufficient documentation

## 2012-11-10 DIAGNOSIS — I10 Essential (primary) hypertension: Secondary | ICD-10-CM | POA: Insufficient documentation

## 2012-11-10 DIAGNOSIS — I4892 Unspecified atrial flutter: Secondary | ICD-10-CM

## 2012-11-10 DIAGNOSIS — J019 Acute sinusitis, unspecified: Secondary | ICD-10-CM

## 2012-11-10 DIAGNOSIS — R002 Palpitations: Secondary | ICD-10-CM | POA: Insufficient documentation

## 2012-11-10 DIAGNOSIS — E785 Hyperlipidemia, unspecified: Secondary | ICD-10-CM

## 2012-11-10 DIAGNOSIS — J449 Chronic obstructive pulmonary disease, unspecified: Secondary | ICD-10-CM | POA: Insufficient documentation

## 2012-11-10 DIAGNOSIS — R509 Fever, unspecified: Secondary | ICD-10-CM

## 2012-11-10 DIAGNOSIS — R7303 Prediabetes: Secondary | ICD-10-CM

## 2012-11-10 LAB — POCT CBC
Hemoglobin: 15.6 g/dL (ref 14.1–18.1)
Lymph, poc: 2.2 (ref 0.6–3.4)
MCH, POC: 30 pg (ref 27–31.2)
MCHC: 35.1 g/dL (ref 31.8–35.4)
MPV: 7.9 fL (ref 0–99.8)
POC Granulocyte: 6 (ref 2–6.9)
POC LYMPH PERCENT: 24.9 %L (ref 10–50)
Platelet Count, POC: 216 10*3/uL (ref 142–424)
RDW, POC: 12.6 %
WBC: 8.9 10*3/uL (ref 4.6–10.2)

## 2012-11-10 LAB — COMPLETE METABOLIC PANEL WITH GFR
ALT: 71 U/L — ABNORMAL HIGH (ref 0–53)
AST: 50 U/L — ABNORMAL HIGH (ref 0–37)
Alkaline Phosphatase: 89 U/L (ref 39–117)
BUN: 13 mg/dL (ref 6–23)
Creat: 0.88 mg/dL (ref 0.50–1.35)
Total Bilirubin: 0.9 mg/dL (ref 0.3–1.2)

## 2012-11-10 LAB — POCT GLYCOSYLATED HEMOGLOBIN (HGB A1C): Hemoglobin A1C: 6.9

## 2012-11-10 MED ORDER — FLUTICASONE PROPIONATE 50 MCG/ACT NA SUSP: 2.0000 | Freq: Every day | NASAL | Status: DC

## 2012-11-10 MED ORDER — AZITHROMYCIN 250 MG PO TABS: ORAL_TABLET | ORAL | Status: DC

## 2012-11-10 NOTE — Patient Instructions (Signed)

## 2012-11-10 NOTE — Progress Notes (Signed)
Subjective:    Patient ID: Benjamin Hale, male    DOB: 18-Sep-1965, 47 y.o.   MRN: [id number]  Hyperlipidemia This is a chronic problem. The current episode started more than 1 year ago. The problem is controlled. Recent lipid tests were reviewed and are normal. Pertinent negatives include no chest pain. Treatments tried: on no meds. The current treatment provides moderate improvement of lipids. There are no compliance problems.  Risk factors for coronary artery disease include hypertension.  Sinusitis This is a new problem. The current episode started in the past 7 days. The problem is unchanged. There has been no fever. Associated symptoms include congestion and sinus pressure. Pertinent negatives include no coughing or ear pain. Past treatments include nothing. The treatment provided mild relief.  Atrial flutter Started after having Cervical spine surgery. Was cardioverted and is now on cardizem. No symptoms- no compliant of palpitations. GERD Omeprazole  Working well for symptoms. Asthma Albuterol HFA- but only uses 1-2X/month    Review of Systems  HENT: Positive for congestion and sinus pressure. Negative for ear pain.   Respiratory: Negative for cough.   Cardiovascular: Negative for chest pain and palpitations.  All other systems reviewed and are negative.       Objective:   Physical Exam  Constitutional: He is oriented to person, place, and time. He appears well-developed and well-nourished.  HENT:  Head: Normocephalic.  Right Ear: Hearing, tympanic membrane, external ear and ear canal normal.  Left Ear: Hearing, tympanic membrane, external ear and ear canal normal.  Nose: Right sinus exhibits maxillary sinus tenderness. Right sinus exhibits no frontal sinus tenderness. Left sinus exhibits maxillary sinus tenderness. Left sinus exhibits no frontal sinus tenderness.  Mouth/Throat: Posterior oropharyngeal erythema present.  Eyes: EOM are normal. Pupils are equal, round,  and reactive to light.  Neck: Normal range of motion. Neck supple. No thyromegaly present.  Cardiovascular: Normal rate, regular rhythm, normal heart sounds and intact distal pulses.   No murmur heard. Pulmonary/Chest: Effort normal and breath sounds normal. He has no wheezes. He has no rales.  Abdominal: Soft. Bowel sounds are normal.  Genitourinary: Prostate normal and penis normal.  Musculoskeletal: Normal range of motion.  Neurological: He is alert and oriented to person, place, and time.  Skin: Skin is warm and dry.  Psychiatric: He has a normal mood and affect. His behavior is normal. Judgment and thought content normal.   BP 137/78  Pulse 56  Temp(Src) 99.2 F (37.3 C) (Oral)  Ht 5\' 10"  (1.778 m)  Wt 232 lb (105.235 kg)  BMI 33.29 kg/m2 Results for orders placed in visit on 11/10/12  POCT CBC      Result Value Range   WBC 8.9  4.6 - 10.2 K/uL   Lymph, poc 2.2  0.6 - 3.4   POC LYMPH PERCENT 24.9  10 - 50 %L   POC Granulocyte 6.0  2 - 6.9   Granulocyte percent 67.8  37 - 80 %G   RBC 5.2  4.69 - 6.13 M/uL   Hemoglobin 15.6  14.1 - 18.1 g/dL   HCT, POC 78.2  95.6 - 53.7 %   MCV 85.4  80 - 97 fL   MCH, POC 30.0  27 - 31.2 pg   MCHC 35.1  31.8 - 35.4 g/dL   RDW, POC 21.3     Platelet Count, POC 216.0  142 - 424 K/uL   MPV 7.9  0 - 99.8 fL  POCT GLYCOSYLATED HEMOGLOBIN (HGB A1C)  Result Value Range   Hemoglobin A1C 6.9%            Assessment & Plan:   1. Pre-diabetes   2. Other and unspecified hyperlipidemia   3. Atrial flutter   4. Fever, unspecified   5. HTN (hypertension)   6. Acute sinusitis    Orders Placed This Encounter  Procedures  . COMPLETE METABOLIC PANEL WITH GFR  . NMR Lipoprofile with Lipids  . POCT CBC  . POCT glycosylated hemoglobin (Hb A1C)   Meds ordered this encounter  Medications  . fluticasone (FLONASE) 50 MCG/ACT nasal spray    Sig: Place 2 sprays into the nose daily.    Dispense:  16 g    Refill:  6    Order Specific  Question:  Supervising Provider    Answer:  Ernestina Penna [[id number]]  . azithromycin (ZITHROMAX Z-PAK) 250 MG tablet    Sig: As directed    Dispense:  6 each    Refill:  0    Order Specific Question:  Supervising Provider    Answer:  Deborra Medina   Force fluids Avoid allergens Low carb diet- Limit of 50carbs per meal and 15 carbs per snack Exercise F/U in 3 months  Mary-Margaret Daphine Deutscher, [name]

## 2012-11-10 NOTE — ED Notes (Addendum)
Pt c/o feeling like his heart is racing. Pt unsure if he took his Cardizem this morning. Heart rate is 82 in triage. So he took another cardizem around 6:30 pm

## 2012-11-11 ENCOUNTER — Telehealth: Payer: Self-pay | Admitting: Nurse Practitioner

## 2012-11-11 ENCOUNTER — Telehealth: Payer: Self-pay | Admitting: Cardiology

## 2012-11-11 LAB — NMR LIPOPROFILE WITH LIPIDS
Cholesterol, Total: 110 mg/dL (ref <200)
HDL Particle Number: 19 umol/L — ABNORMAL LOW (ref >=30.5)
HDL-C: 22 mg/dL — ABNORMAL LOW (ref >=40)
LDL (calc): 38 mg/dL (ref <100)
LDL Particle Number: 986 nmol/L (ref <1000)
LDL Size: 19.7 nm — ABNORMAL LOW (ref >20.5)
LP-IR Score: 61 — ABNORMAL HIGH (ref <=45)
Small LDL Particle Number: 800 nmol/L — ABNORMAL HIGH (ref <=527)
VLDL Size: 51.2 nm — ABNORMAL HIGH (ref <=46.6)

## 2012-11-11 NOTE — Telephone Encounter (Signed)
Continue cardizem and schedule fu ov Benjamin Hale

## 2012-11-11 NOTE — Telephone Encounter (Signed)
Spoke with pt, he frogot to take his cardizem yesterday morning, about 6 pm he went out of rhythm and his heart rate was 140. He took the cardizem and then about 8 pm went to the ER. By the time he got to the ER he was back in sinus rhythm. He also took a baby asa 81 mg last night. He has an appt with the surgeon today to ask about taking asa everyday. This is the first episode he has had since discharge. He has a follow up appt with dr Jens Som 12-13-12 but is concerned with the fact that just missing one dose of med he went out of rhythm. He feels fine today and is back in rhythm. He questions if he needs to be seen sooner. Will forward for dr Jens Som review

## 2012-11-11 NOTE — Telephone Encounter (Signed)
FYI to MMM

## 2012-11-11 NOTE — Telephone Encounter (Signed)
New problem   Pt's went into aflutter last night and went to ER. He want to know if he need to come in today or this week. Please call pt.

## 2012-11-11 NOTE — Telephone Encounter (Signed)
Spoke with pt, he will see dr Jens Som tomorrow.

## 2012-11-11 NOTE — Telephone Encounter (Signed)
ok 

## 2012-11-12 ENCOUNTER — Ambulatory Visit (INDEPENDENT_AMBULATORY_CARE_PROVIDER_SITE_OTHER): Payer: 59 | Admitting: Cardiology

## 2012-11-12 ENCOUNTER — Encounter: Payer: Self-pay | Admitting: Cardiology

## 2012-11-12 VITALS — BP 110/62 | HR 60 | Ht 70.0 in | Wt 231.4 lb

## 2012-11-12 DIAGNOSIS — I4892 Unspecified atrial flutter: Secondary | ICD-10-CM

## 2012-11-12 DIAGNOSIS — I10 Essential (primary) hypertension: Secondary | ICD-10-CM

## 2012-11-12 NOTE — Assessment & Plan Note (Signed)
Patient had atrial flutter following cervical disc surgery. He required cardioversion but we felt this was most likely postoperative atrial arrhythmia. LV function was normal as was TSH. He had recurrent symptoms 2 days ago. He checked his heart rate and it was 140. He did go to the hospital but his symptoms resolved prior to arriving. Although we have no rhythm strip or electrocardiogram to document his most recent episode he did state these were the exact symptoms he had when he had his previous atrial flutter. I will continue his Cardizem. He has embolic risk factors of hypertension. His hemoglobin A1c was recently noted to be 6.9. I will ask one of the electrophysiologists to evaluate him for consideration of atrial flutter ablation as this would potentially decrease the risk of an embolic event in the future. I will continue aspirin for now.

## 2012-11-12 NOTE — Progress Notes (Signed)
HPI: Pleasant male for fu of atrial flutter. He developed AFlutter with RVR after cervical decompression and discectomy for cervical spondylosis in April 2014. He was rate controlled with IV diltiazem. He did not convert on his own and underwent DCCV within 24 hours of onset. Echo 09/2012: Mild LVH, EF 60-65%, Gr 2 DD. TSH 0.510. Patient did well until 2 days ago. He was sitting at home and suddenly felt like something wasn't right in his chest. He checked his pulse and blood pressure. His pulse was 140 and he noticed palpitations. This is similar to the symptoms which he had at the time of his previous atrial flutter. He had not had his Cardizem that day. He took his Cardizem and his heart rate slowed and he ultimately converted prior to arriving to the hospital. He did not have chest pain or dizziness. He did have residual chest soreness. He otherwise denies dyspnea on exertion, orthopnea, PND, pedal edema or exertional chest pain.   Current Outpatient Prescriptions  Medication Sig Dispense Refill  . albuterol (PROVENTIL HFA;VENTOLIN HFA) 108 (90 BASE) MCG/ACT inhaler Inhale 2 puffs into the lungs every 6 (six) hours as needed for wheezing.      Marland Kitchen azithromycin (ZITHROMAX Z-PAK) 250 MG tablet As directed  6 each  0  . diltiazem (CARDIZEM CD) 180 MG 24 hr capsule Take 1 capsule (180 mg total) by mouth daily.  30 capsule  6  . fluticasone (FLONASE) 50 MCG/ACT nasal spray Place 2 sprays into the nose daily.  16 g  6  . lisinopril (PRINIVIL,ZESTRIL) 10 MG tablet Take 1 tablet (10 mg total) by mouth daily.  90 tablet  3  . omeprazole (PRILOSEC) 20 MG capsule Take 20 mg by mouth daily as needed. Acid reflux.       No current facility-administered medications for this visit.     Past Medical History  Diagnosis Date  . COPD (chronic obstructive pulmonary disease)   . GERD (gastroesophageal reflux disease)   . Headache(784.0)     migraines  . Cervical disc disease     s/p cervical spine surgery  4/14  . Atrial flutter     a. s/p cervical spine surgery 4/14 => s/p DCCV;  b.  Echo 09/2012:  Mild LVH, EF 60-65%, Gr 2 DD.  Marland Kitchen HTN (hypertension)   . Hyperlipidemia   . Hypogonadism male     Past Surgical History  Procedure Laterality Date  . Treatment fistula anal    . Wisdom tooth extraction    . Anterior cervical decompression/discectomy fusion 2 levels  09/2012  . Anterior cervical decomp/discectomy fusion N/A 09/22/2012    Procedure: ANTERIOR CERVICAL DECOMPRESSION/DISCECTOMY FUSION 2 LEVELS;  Surgeon: Karn Cassis, MD;  Location: [facility];  Service: Neurosurgery;  Laterality: N/A;  Cervical five-six Cervical six-seven Anterior cervical decompression/diskectomy/fusion  . Cardioversion N/A 09/23/2012    Procedure: CARDIOVERSION;  Surgeon: Gaylord Shih, MD;  Location: [facility] OR;  Service: Cardiovascular;  Laterality: N/A;    History   Social History  . Marital Status: Married    Spouse Name: N/A    Number of Children: N/A  . Years of Education: N/A   Occupational History  . Not on file.   Social History Main Topics  . Smoking status: Former Smoker -- 20 years    Types: Cigarettes    Quit date: 06/16/2005  . Smokeless tobacco: Not on file  . Alcohol Use: No     Comment: previously drank heavily - quit  8 yrs ago.  . Drug Use: No  . Sexually Active: Yes   Other Topics Concern  . Not on file   Social History Narrative   Lives in [place] with his wife and 101 yr old son.  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    ROS: no fevers or chills, productive cough, hemoptysis, dysphasia, odynophagia, melena, hematochezia, dysuria, hematuria, rash, seizure activity, orthopnea, PND, pedal edema, claudication. Remaining systems are negative.  Physical Exam: Well-developed well-nourished in no acute distress.  Skin is warm and dry.  HEENT is normal.  Neck is supple.  Chest is clear to auscultation with normal expansion.  Cardiovascular exam is regular rate  and rhythm.  Abdominal exam nontender or distended. No masses palpated. Extremities show no edema. neuro grossly intact  ECG 11/10/2012-sinus rhythm.

## 2012-11-12 NOTE — Assessment & Plan Note (Signed)
Continue present medications. 

## 2012-11-12 NOTE — Patient Instructions (Addendum)
REFERRAL TO EP FOR CONSIDERATION OF ATRIAL FLUTTER ABLATION= DR Hillis Range 11-26-12 @ 12 NOON  Your physician wants you to follow-up in: 6 MONTHS WITH DR Jens Som You will receive a reminder letter in the mail two months in advance. If you don't receive a letter, please call our office to schedule the follow-up appointment.

## 2012-11-26 ENCOUNTER — Ambulatory Visit (INDEPENDENT_AMBULATORY_CARE_PROVIDER_SITE_OTHER): Payer: 59 | Admitting: Internal Medicine

## 2012-11-26 ENCOUNTER — Encounter: Payer: Self-pay | Admitting: Internal Medicine

## 2012-11-26 ENCOUNTER — Encounter: Payer: Self-pay | Admitting: *Deleted

## 2012-11-26 VITALS — BP 128/80 | HR 58 | Ht 70.0 in | Wt 232.6 lb

## 2012-11-26 DIAGNOSIS — I4892 Unspecified atrial flutter: Secondary | ICD-10-CM

## 2012-11-26 NOTE — Progress Notes (Signed)
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  He reports initially being diagnosed with atrial flutter 09/2012 following cervical surgery.  He was successfully cardioverted.  He did well and was treated with cardizem.  He reports that 2 weeks ago, he developed recurrent tachypalpitations with an uneasiness.  His HR was 140s at that time.  By the time that he arrived at [facility], his symptoms had resolved.  He has been seen by Dr Jens Som and is referred for consideration of ablation. Today, he denies symptoms of chest pain, shortness of breath, orthopnea, PND, lower extremity edema, dizziness, presyncope, syncope, or neurologic sequela. The patient is tolerating medications without difficulties and is otherwise without complaint today.   Past Medical History  Diagnosis Date  . COPD (chronic obstructive pulmonary disease)   . GERD (gastroesophageal reflux disease)   . Headache(784.0)     migraines  . Cervical disc disease     s/p cervical spine surgery 4/14  . Atrial flutter     a. s/p cervical spine surgery 4/14 => s/p DCCV;  b.  Echo 09/2012:  Mild LVH, EF 60-65%, Gr 2 DD.  Marland Kitchen HTN (hypertension)   . Hyperlipidemia   . Hypogonadism male    Past Surgical History  Procedure Laterality Date  . Treatment fistula anal    . Wisdom tooth extraction    . Anterior cervical decompression/discectomy fusion 2 levels  09/2012  . Anterior cervical decomp/discectomy fusion N/A 09/22/2012    Procedure: ANTERIOR CERVICAL DECOMPRESSION/DISCECTOMY FUSION 2 LEVELS;  Surgeon: Karn Cassis, MD;  Location: [facility];  Service: Neurosurgery;  Laterality: N/A;  Cervical five-six Cervical six-seven Anterior cervical decompression/diskectomy/fusion  . Cardioversion N/A 09/23/2012    Procedure: CARDIOVERSION;  Surgeon: Gaylord Shih, MD;  Location: [facility] OR;   Service: Cardiovascular;  Laterality: N/A;    Current Outpatient Prescriptions  Medication Sig Dispense Refill  . albuterol (PROVENTIL HFA;VENTOLIN HFA) 108 (90 BASE) MCG/ACT inhaler Inhale 2 puffs into the lungs every 6 (six) hours as needed for wheezing.      Marland Kitchen aspirin 81 MG tablet Take 81 mg by mouth daily.      Marland Kitchen diltiazem (CARDIZEM CD) 180 MG 24 hr capsule Take 1 capsule (180 mg total) by mouth daily.  30 capsule  6  . fluticasone (FLONASE) 50 MCG/ACT nasal spray Place 2 sprays into the nose daily.  16 g  6  . lisinopril (PRINIVIL,ZESTRIL) 10 MG tablet Take 1 tablet (10 mg total) by mouth daily.  90 tablet  3  . omeprazole (PRILOSEC) 20 MG capsule Take 20 mg by mouth daily as needed. Acid reflux.       No current facility-administered medications for this visit.    No Known Allergies  History   Social History  . Marital Status: Married    Spouse Name: N/A    Number of Children: N/A  . Years of Education: N/A   Occupational History  . Not on file.   Social History Main Topics  . Smoking status: Former Smoker -- 20 years    Types: Cigarettes    Quit date: 06/16/2005  . Smokeless tobacco: Not on file  . Alcohol Use: No     Comment: previously drank heavily - quit 8 yrs ago.  . Drug Use: No  . Sexually Active: Yes   Other Topics  Concern  . Not on file   Social History Narrative   Lives in [place] with his wife and 59 yr old son.  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    Family History  Problem Relation Age of Onset  . Lung cancer Mother     died @ 72  . Brain cancer Father     died @ 86    ROS- All systems are reviewed and negative except as per the HPI above  Physical Exam: Filed Vitals:   11/26/12 1206  BP: 128/80  Pulse: 58  Height: 5\' 10"  (1.778 m)  Weight: 232 lb 9.6 oz (105.507 kg)    GEN- The patient is well appearing, alert and oriented x 3 today.   Head- normocephalic, atraumatic Eyes-  Sclera clear, conjunctiva  pink Ears- hearing intact Oropharynx- clear Neck- supple, no JVP Lymph- no cervical lymphadenopathy Lungs- Clear to ausculation bilaterally, normal work of breathing Heart- Regular rate and rhythm, no murmurs, rubs or gallops, PMI not laterally displaced GI- soft, NT, ND, + BS Extremities- no clubbing, cyanosis, or edema MS- no significant deformity or atrophy Skin- no rash or lesion Psych- euthymic mood, full affect Neuro- strength and sensation are intact  EKG 09/23/12 reveals typical appearing atrial flutter ekg 11/26/12 reveals sinus rhythm 58 bpm, otherwise normal ekg Echo, hospital records, and Dr Ludwig Clarks notes are reviewed  Assessment and Plan:  1. Atrial flutter The patient has symptomatic recurrent typical appearing atrial flutter Therapeutic strategies for atrial flutter including medicine and ablation were discussed in detail with the patient today. Risk, benefits, and alternatives to EP study and radiofrequency ablation were also discussed in detail today. These risks include but are not limited to stroke, bleeding, vascular damage, tamponade, perforation, damage to the heart and other structures, AV block requiring pacemaker, worsening renal function, and death. The patient understands these risk and wishes to proceed.  We will therefore proceed with catheter ablation at the next available time.

## 2012-11-29 ENCOUNTER — Other Ambulatory Visit: Payer: Self-pay | Admitting: *Deleted

## 2012-12-09 ENCOUNTER — Encounter (HOSPITAL_COMMUNITY): Payer: Self-pay | Admitting: Pharmacy Technician

## 2012-12-13 ENCOUNTER — Ambulatory Visit: Payer: 59 | Admitting: Cardiology

## 2012-12-14 ENCOUNTER — Ambulatory Visit (INDEPENDENT_AMBULATORY_CARE_PROVIDER_SITE_OTHER): Payer: 59 | Admitting: *Deleted

## 2012-12-14 DIAGNOSIS — I4892 Unspecified atrial flutter: Secondary | ICD-10-CM

## 2012-12-14 LAB — BASIC METABOLIC PANEL
Chloride: 105 mEq/L (ref 96–112)
GFR: 96.25 mL/min (ref >60.00)
Potassium: 3.7 mEq/L (ref 3.5–5.1)
Sodium: 137 mEq/L (ref 135–145)

## 2012-12-14 LAB — CBC WITH DIFFERENTIAL/PLATELET
Basophils Absolute: 0.1 10*3/uL (ref 0.0–0.1)
Eosinophils Relative: 2.3 % (ref 0.0–5.0)
HCT: 45.4 % (ref 39.0–52.0)
Hemoglobin: 15.8 g/dL (ref 13.0–17.0)
Lymphocytes Relative: 29.5 % (ref 12.0–46.0)
Lymphs Abs: 2.8 10*3/uL (ref 0.7–4.0)
Monocytes Relative: 9 % (ref 3.0–12.0)
Platelets: 224 10*3/uL (ref 150.0–400.0)
RDW: 13.3 % (ref 11.5–14.6)
WBC: 9.4 10*3/uL (ref 4.5–10.5)

## 2012-12-14 LAB — PROTIME-INR: Prothrombin Time: 12.8 s — ABNORMAL HIGH (ref 10.2–12.4)

## 2012-12-20 ENCOUNTER — Encounter: Payer: Self-pay | Admitting: Internal Medicine

## 2012-12-20 NOTE — Telephone Encounter (Signed)
New problem ° ° °Pt is returning your call.  °

## 2012-12-20 NOTE — Telephone Encounter (Signed)
This encounter was created in error - please disregard.

## 2012-12-23 ENCOUNTER — Ambulatory Visit (HOSPITAL_COMMUNITY)
Admission: RE | Admit: 2012-12-23 | Discharge: 2012-12-24 | Disposition: A | Discharge status: Home / Self Care | Payer: 59 | Source: Ambulatory Visit | Attending: Internal Medicine | Admitting: Internal Medicine

## 2012-12-23 ENCOUNTER — Encounter (HOSPITAL_COMMUNITY): Payer: Self-pay | Admitting: Anesthesiology

## 2012-12-23 ENCOUNTER — Ambulatory Visit (HOSPITAL_COMMUNITY): Payer: 59 | Admitting: Anesthesiology

## 2012-12-23 ENCOUNTER — Encounter (HOSPITAL_COMMUNITY): Payer: Self-pay | Admitting: General Practice

## 2012-12-23 ENCOUNTER — Encounter (HOSPITAL_COMMUNITY)
Admission: RE | Disposition: A | Discharge status: Home / Self Care | Payer: Self-pay | Source: Ambulatory Visit | Attending: Internal Medicine

## 2012-12-23 DIAGNOSIS — I4892 Unspecified atrial flutter: Secondary | ICD-10-CM | POA: Diagnosis present

## 2012-12-23 DIAGNOSIS — J449 Chronic obstructive pulmonary disease, unspecified: Secondary | ICD-10-CM | POA: Insufficient documentation

## 2012-12-23 DIAGNOSIS — I10 Essential (primary) hypertension: Secondary | ICD-10-CM | POA: Insufficient documentation

## 2012-12-23 DIAGNOSIS — J4489 Other specified chronic obstructive pulmonary disease: Secondary | ICD-10-CM | POA: Insufficient documentation

## 2012-12-23 DIAGNOSIS — Z79899 Other long term (current) drug therapy: Secondary | ICD-10-CM | POA: Insufficient documentation

## 2012-12-23 HISTORY — DX: Headache: R51

## 2012-12-23 HISTORY — DX: Unspecified asthma, uncomplicated: J45.909

## 2012-12-23 HISTORY — DX: Angina pectoris, unspecified: I20.9

## 2012-12-23 HISTORY — DX: Unspecified osteoarthritis, unspecified site: M19.90

## 2012-12-23 HISTORY — DX: Headache, unspecified: R51.9

## 2012-12-23 HISTORY — PX: ATRIAL FLUTTER ABLATION: SHX5733

## 2012-12-23 HISTORY — DX: Migraine, unspecified, not intractable, without status migrainosus: G43.909

## 2012-12-23 HISTORY — DX: Shortness of breath: R06.02

## 2012-12-23 SURGERY — ATRIAL FLUTTER ABLATION
Anesthesia: General

## 2012-12-23 MED ORDER — FENTANYL CITRATE 0.05 MG/ML IJ SOLN
INTRAMUSCULAR | Status: DC | PRN
  Administered 2012-12-23: 25 ug via INTRAVENOUS
  Administered 2012-12-23 (×4): 50 ug via INTRAVENOUS
  Administered 2012-12-23: 25 ug via INTRAVENOUS

## 2012-12-23 MED ORDER — HYDROCODONE-ACETAMINOPHEN 5-325 MG PO TABS: 1.0000 | ORAL_TABLET | ORAL | Status: DC | PRN

## 2012-12-23 MED ORDER — ACETAMINOPHEN 325 MG PO TABS
650.0000 mg | ORAL_TABLET | ORAL | Status: DC | PRN
  Administered 2012-12-23: 650 mg via ORAL

## 2012-12-23 MED ORDER — ONDANSETRON HCL 4 MG/2ML IJ SOLN: 4.0000 mg | Freq: Once | INTRAMUSCULAR | Status: AC | PRN

## 2012-12-23 MED ORDER — PROPOFOL 10 MG/ML IV BOLUS
INTRAVENOUS | Status: DC | PRN
  Administered 2012-12-23: 30 mg via INTRAVENOUS
  Administered 2012-12-23: 200 mg via INTRAVENOUS

## 2012-12-23 MED ORDER — LISINOPRIL 10 MG PO TABS
10.0000 mg | ORAL_TABLET | Freq: Every day | ORAL | Status: DC
  Administered 2012-12-23: 10 mg via ORAL
  Filled 2012-12-23 (×2): qty 1

## 2012-12-23 MED ORDER — ONDANSETRON HCL 4 MG/2ML IJ SOLN
INTRAMUSCULAR | Status: AC
  Filled 2012-12-23: qty 2

## 2012-12-23 MED ORDER — ARTIFICIAL TEARS OP OINT
TOPICAL_OINTMENT | OPHTHALMIC | Status: DC | PRN
  Administered 2012-12-23: 1 via OPHTHALMIC

## 2012-12-23 MED ORDER — HYDROMORPHONE HCL PF 1 MG/ML IJ SOLN: 0.2500 mg | INTRAMUSCULAR | Status: DC | PRN

## 2012-12-23 MED ORDER — ALBUTEROL SULFATE HFA 108 (90 BASE) MCG/ACT IN AERS
2.0000 | INHALATION_SPRAY | Freq: Four times a day (QID) | RESPIRATORY_TRACT | Status: DC | PRN
  Filled 2012-12-23: qty 6.7

## 2012-12-23 MED ORDER — SODIUM CHLORIDE 0.9 % IV SOLN: 250.0000 mL | INTRAVENOUS | Status: DC | PRN

## 2012-12-23 MED ORDER — ONDANSETRON HCL 4 MG/2ML IJ SOLN
INTRAMUSCULAR | Status: DC | PRN
  Administered 2012-12-23: 4 mg via INTRAVENOUS

## 2012-12-23 MED ORDER — SODIUM CHLORIDE 0.9 % IJ SOLN: 3.0000 mL | INTRAMUSCULAR | Status: DC | PRN

## 2012-12-23 MED ORDER — ACETAMINOPHEN 10 MG/ML IV SOLN
1000.0000 mg | Freq: Once | INTRAVENOUS | Status: AC | PRN
  Filled 2012-12-23: qty 100

## 2012-12-23 MED ORDER — ASPIRIN 81 MG PO CHEW
81.0000 mg | CHEWABLE_TABLET | Freq: Every day | ORAL | Status: DC
  Administered 2012-12-23: 19:00:00 81 mg via ORAL
  Filled 2012-12-23 (×2): qty 1

## 2012-12-23 MED ORDER — PANTOPRAZOLE SODIUM 40 MG PO TBEC
40.0000 mg | DELAYED_RELEASE_TABLET | Freq: Every day | ORAL | Status: DC
  Administered 2012-12-23: 40 mg via ORAL

## 2012-12-23 MED ORDER — SODIUM CHLORIDE 0.9 % IV SOLN
INTRAVENOUS | Status: DC | PRN
  Administered 2012-12-23: 12:00:00 via INTRAVENOUS

## 2012-12-23 MED ORDER — SODIUM CHLORIDE 0.9 % IJ SOLN
3.0000 mL | Freq: Two times a day (BID) | INTRAMUSCULAR | Status: DC
  Administered 2012-12-23: 3 mL via INTRAVENOUS

## 2012-12-23 MED ORDER — MIDAZOLAM HCL 5 MG/5ML IJ SOLN
INTRAMUSCULAR | Status: DC | PRN
  Administered 2012-12-23: 2 mg via INTRAVENOUS

## 2012-12-23 MED ORDER — LIDOCAINE HCL (CARDIAC) 20 MG/ML IV SOLN
INTRAVENOUS | Status: DC | PRN
  Administered 2012-12-23: 4 mg
  Administered 2012-12-23: 60 mg via INTRAVENOUS

## 2012-12-23 MED ORDER — ONDANSETRON HCL 4 MG/2ML IJ SOLN: 4.0000 mg | Freq: Four times a day (QID) | INTRAMUSCULAR | Status: DC | PRN

## 2012-12-23 NOTE — Anesthesia Preprocedure Evaluation (Signed)
Anesthesia Evaluation  Patient identified by MRN, date of birth, ID band Patient awake    Reviewed: Allergy & Precautions, H&P , NPO status , Patient's Chart, lab work & pertinent test results  Airway Mallampati: II      Dental  (+) Teeth Intact and Dental Advisory Given   Pulmonary  breath sounds clear to auscultation        Cardiovascular Rhythm:Regular Rate:Normal     Neuro/Psych    GI/Hepatic   Endo/Other    Renal/GU      Musculoskeletal   Abdominal   Peds  Hematology   Anesthesia Other Findings   Reproductive/Obstetrics                           Anesthesia Physical Anesthesia Plan  ASA: III  Anesthesia Plan: General   Post-op Pain Management:    Induction: Intravenous  Airway Management Planned: LMA  Additional Equipment:   Intra-op Plan:   Post-operative Plan: Extubation in OR  Informed Consent: I have reviewed the patients History and Physical, chart, labs and discussed the procedure including the risks, benefits and alternatives for the proposed anesthesia with the patient or authorized representative who has indicated his/her understanding and acceptance.   Dental advisory given  Plan Discussed with: CRNA and Anesthesiologist  Anesthesia Plan Comments: (History of paroxysmal afib Htn GERD  Plan with LMA  Kipp Brood, MD)        Anesthesia Quick Evaluation

## 2012-12-23 NOTE — Anesthesia Postprocedure Evaluation (Signed)
  Anesthesia Post-op Note  Patient: Benjamin Hale  Procedure(s) Performed: Procedure(s): ATRIAL FLUTTER ABLATION (N/A)  Patient Location: PACU and Cath Lab  Anesthesia Type:General  Level of Consciousness: awake, alert  and oriented  Airway and Oxygen Therapy: Patient Spontanous Breathing and Patient connected to nasal cannula oxygen  Post-op Pain: mild  Post-op Assessment: Post-op Vital signs reviewed, Patient's Cardiovascular Status Stable, Respiratory Function Stable, Patent Airway and Pain level controlled  Post-op Vital Signs: stable  Complications: No apparent anesthesia complications

## 2012-12-23 NOTE — Anesthesia Procedure Notes (Signed)
Procedure Name: LMA Insertion Date/Time: 12/23/2012 12:18 PM Performed by: Elizbeth Squires R Pre-anesthesia Checklist: Patient identified, Emergency Drugs available, Suction available and Patient being monitored Patient Re-evaluated:Patient Re-evaluated prior to inductionOxygen Delivery Method: Circle system utilized Preoxygenation: Pre-oxygenation with 100% oxygen Intubation Type: IV induction Ventilation: Mask ventilation without difficulty LMA: LMA inserted LMA Size: 5.0 Number of attempts: 1 Placement Confirmation: positive ETCO2 and breath sounds checked- equal and bilateral Tube secured with: Tape Dental Injury: Teeth and Oropharynx as per pre-operative assessment

## 2012-12-23 NOTE — Interval H&P Note (Signed)
History and Physical Interval Note:  12/23/2012 11:09 AM  Benjamin Hale  has presented today for surgery, with the diagnosis of aflutter  The various methods of treatment have been discussed with the patient and family. After consideration of risks, benefits and other options for treatment, the patient has consented to  Procedure(s): ATRIAL FLUTTER ABLATION (N/A) as a surgical intervention .  The patient's history has been reviewed, patient examined, no change in status, stable for surgery.  I have reviewed the patient's chart and labs.  Questions were answered to the patient's satisfaction.     Hillis Range

## 2012-12-23 NOTE — Transfer of Care (Signed)
Immediate Anesthesia Transfer of Care Note  Patient: Benjamin Hale  Procedure(s) Performed: Procedure(s): ATRIAL FLUTTER ABLATION (N/A)  Patient Location: Cath Lab  Anesthesia Type:General  Level of Consciousness: awake, alert  and oriented  Airway & Oxygen Therapy: Patient Spontanous Breathing and Patient connected to nasal cannula oxygen  Post-op Assessment: Report given to PACU RN, Post -op Vital signs reviewed and stable and Patient moving all extremities  Post vital signs: Reviewed and stable  Complications: No apparent anesthesia complications

## 2012-12-23 NOTE — H&P (View-Only) (Signed)
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  He reports initially being diagnosed with atrial flutter 09/2012 following cervical surgery.  He was successfully cardioverted.  He did well and was treated with cardizem.  He reports that 2 weeks ago, he developed recurrent tachypalpitations with an uneasiness.  His HR was 140s at that time.  By the time that he arrived at [facility], his symptoms had resolved.  He has been seen by Dr Jens Som and is referred for consideration of ablation. Today, he denies symptoms of chest pain, shortness of breath, orthopnea, PND, lower extremity edema, dizziness, presyncope, syncope, or neurologic sequela. The patient is tolerating medications without difficulties and is otherwise without complaint today.   Past Medical History  Diagnosis Date  . COPD (chronic obstructive pulmonary disease)   . GERD (gastroesophageal reflux disease)   . Headache(784.0)     migraines  . Cervical disc disease     s/p cervical spine surgery 4/14  . Atrial flutter     a. s/p cervical spine surgery 4/14 => s/p DCCV;  b.  Echo 09/2012:  Mild LVH, EF 60-65%, Gr 2 DD.  Marland Kitchen HTN (hypertension)   . Hyperlipidemia   . Hypogonadism male    Past Surgical History  Procedure Laterality Date  . Treatment fistula anal    . Wisdom tooth extraction    . Anterior cervical decompression/discectomy fusion 2 levels  09/2012  . Anterior cervical decomp/discectomy fusion N/A 09/22/2012    Procedure: ANTERIOR CERVICAL DECOMPRESSION/DISCECTOMY FUSION 2 LEVELS;  Surgeon: Karn Cassis, MD;  Location: [facility];  Service: Neurosurgery;  Laterality: N/A;  Cervical five-six Cervical six-seven Anterior cervical decompression/diskectomy/fusion  . Cardioversion N/A 09/23/2012    Procedure: CARDIOVERSION;  Surgeon: Gaylord Shih, MD;  Location: [facility] OR;   Service: Cardiovascular;  Laterality: N/A;    Current Outpatient Prescriptions  Medication Sig Dispense Refill  . albuterol (PROVENTIL HFA;VENTOLIN HFA) 108 (90 BASE) MCG/ACT inhaler Inhale 2 puffs into the lungs every 6 (six) hours as needed for wheezing.      Marland Kitchen aspirin 81 MG tablet Take 81 mg by mouth daily.      Marland Kitchen diltiazem (CARDIZEM CD) 180 MG 24 hr capsule Take 1 capsule (180 mg total) by mouth daily.  30 capsule  6  . fluticasone (FLONASE) 50 MCG/ACT nasal spray Place 2 sprays into the nose daily.  16 g  6  . lisinopril (PRINIVIL,ZESTRIL) 10 MG tablet Take 1 tablet (10 mg total) by mouth daily.  90 tablet  3  . omeprazole (PRILOSEC) 20 MG capsule Take 20 mg by mouth daily as needed. Acid reflux.       No current facility-administered medications for this visit.    No Known Allergies  History   Social History  . Marital Status: Married    Spouse Name: N/A    Number of Children: N/A  . Years of Education: N/A   Occupational History  . Not on file.   Social History Main Topics  . Smoking status: Former Smoker -- 20 years    Types: Cigarettes    Quit date: 06/16/2005  . Smokeless tobacco: Not on file  . Alcohol Use: No     Comment: previously drank heavily - quit 8 yrs ago.  . Drug Use: No  . Sexually Active: Yes   Other Topics  Concern  . Not on file   Social History Narrative   Lives in [place] with his wife and 27 yr old son.  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    Family History  Problem Relation Age of Onset  . Lung cancer Mother     died @ 1  . Brain cancer Father     died @ 30    ROS- All systems are reviewed and negative except as per the HPI above  Physical Exam: Filed Vitals:   11/26/12 1206  BP: 128/80  Pulse: 58  Height: 5\' 10"  (1.778 m)  Weight: 232 lb 9.6 oz (105.507 kg)    GEN- The patient is well appearing, alert and oriented x 3 today.   Head- normocephalic, atraumatic Eyes-  Sclera clear, conjunctiva  pink Ears- hearing intact Oropharynx- clear Neck- supple, no JVP Lymph- no cervical lymphadenopathy Lungs- Clear to ausculation bilaterally, normal work of breathing Heart- Regular rate and rhythm, no murmurs, rubs or gallops, PMI not laterally displaced GI- soft, NT, ND, + BS Extremities- no clubbing, cyanosis, or edema MS- no significant deformity or atrophy Skin- no rash or lesion Psych- euthymic mood, full affect Neuro- strength and sensation are intact  EKG 09/23/12 reveals typical appearing atrial flutter ekg 11/26/12 reveals sinus rhythm 58 bpm, otherwise normal ekg Echo, hospital records, and Dr Ludwig Clarks notes are reviewed  Assessment and Plan:  1. Atrial flutter The patient has symptomatic recurrent typical appearing atrial flutter Therapeutic strategies for atrial flutter including medicine and ablation were discussed in detail with the patient today. Risk, benefits, and alternatives to EP study and radiofrequency ablation were also discussed in detail today. These risks include but are not limited to stroke, bleeding, vascular damage, tamponade, perforation, damage to the heart and other structures, AV block requiring pacemaker, worsening renal function, and death. The patient understands these risk and wishes to proceed.  We will therefore proceed with catheter ablation at the next available time.

## 2012-12-23 NOTE — Op Note (Signed)
PREPROCEDURE DIAGNOSIS: Atrial flutter.   POSTPROCEDURE DIAGNOSIS: Atrial flutter.   PROCEDURES:  1. Comprehensive EP study.  2. Coronary sinus pacing and recording.  3. Mapping of SVT.  4. Ablation of SVT.   INTRODUCTION:  Benjamin Hale is a 47 y.o. male with a history of symptomatic typical-appearing atrial flutter. He presents today for EP study and radiofrequency ablation.   DESCRIPTION OF THE PROCEDURE: Informed written consent was obtained, and the patient was brought to the electrophysiology lab in the fasting state. The patient was then adequately sedated with anesthesia as outlined in the anesthesia report. The patient's right groin was prepped and draped in the usual sterile fashion by the EP lab staff. Using a percutaneous Seldinger technique a 6, 7, and 8-French hemostasis sheaths were placed into the right common femoral vein. A 7- [employer] decapolar coronary sinus catheter was introduced through the right common femoral vein and advanced into the coronary sinus for recording and pacing from this location. A 6-French quadripolar Josephson catheter was introduced through the right common femoral vein and advanced into the right ventricle for recording and pacing. This catheter was then pulled back to the His bundle location. The patient presented to the electrophysiology lab in sinus rhythm.  His PR interval was 156 msec with a qrs duration of 88 msec and a QT interval of 444 msec.  His RR interval was 1124 msec.  His AH interval was 96 msec with an HV of 39 msec.    Ventricular pacing was performed which revealed midline concentric VA conduction with a VA WCL of .  No arrhythmias were induced. Rapid atrial pacing was performed which revealed decremental AV conduction with PR equal to but not greater than RR and no tachycardias induced.  The AV WCL was .   AEST was performed which revealed decremental AV conduction with a single AH jump but no echo beats  and no tachycardias observed. Multiple attempts were made to induce atrial flutter today, however it could not be induced. Given the clinical appearance of typical atrial flutter, I elected to perform cavotricuspid isthmus ablation.  A [employer] 7-French  10mm ablation catheter was introduced through the right common femoral vein and advanced into the right atrium. Mapping of the cavotricuspid isthmus was performed which revealed a standard isthmus. A series of two radiofrequency applications were delivered along the cavotricuspid isthmus with a target temperature of 60 degrees with power of 70 watts.  Complete bidirectional cavotricuspid isthmus block was achieved.  Following ablation, differential atrial pacing was performed from the low lateral right atrium which confirmed complete bidirectional cavotricuspid isthmus  block with a stimulus to earliest atrial activation recorded bidirectional across the isthmus measuring 160 msec. The patient was observed for 20 minutes without return of conduction through the isthmus.  Following ablation, the AH interval measured 80 msec with an HV interval of 42 msec. Rapid atrial pacing was performed, which revealed an AV Wenckebach cycle length of 310 msec with no evidence of PR greater than RR and no tachycardias induced when pacing down to a cycle length of 200 msec.  Though the patient did have dual AV nodal physiology, in the absence of clinical AVNRT and no inducible arrhythmias today, I felt it prudent to not perform slow pathway modification at this time.  The procedure was therefore considered completed. All catheters were removed, and the sheaths were aspirated and flushed. The sheaths were removed and hemostasis was assured. There were no early apparent complications.  CONCLUSIONS:  1. Sinus rhythm today, with no inducible arrhythmias  2. Empiric cavotricuspid isthmus ablation was performed given clinically documented isthmus dependant right  atrial flutter.  Complete bidirectional cavotricuspid isthmus block achieved.  3. No inducible arrhythmias following ablation.  4. No early apparent complications.  Fayrene Fearing Shyla Gayheart,MD 12/23/2012 1:42 PM

## 2012-12-24 ENCOUNTER — Encounter (HOSPITAL_COMMUNITY): Payer: Self-pay | Admitting: *Deleted

## 2012-12-24 DIAGNOSIS — I4892 Unspecified atrial flutter: Secondary | ICD-10-CM

## 2012-12-24 MED ORDER — OFF THE BEAT BOOK
Freq: Once | Status: AC
  Administered 2012-12-24: 01:00:00
  Filled 2012-12-24: qty 1

## 2012-12-24 NOTE — Progress Notes (Signed)
Utilization Review Completed.   Jeanell Mangan, RN, BSN Nurse Case Manager  [phone]  

## 2012-12-24 NOTE — Discharge Summary (Signed)
Physician Discharge Summary      Patient ID: Benjamin Hale MRN: [id number] DOB/AGE: [birth year] 47 y.o.  Admit date: 12/23/2012 Discharge date: 12/24/2012  Primary Discharge Diagnosis  Atrial flutter  Significant Diagnostic Studies: Atrial flutter ablation  Hospital Course:  The patient was admitted for elective atrial flutter ablation.  He was in sinus upon presentation.  No arrhythmias could be induced.  He did have dual av nodal physiology but no inducible AVNRT.  As he has not had clinical AVNRT, I did not perform slow pathway modification.  His clinical arrhythmia was isthmus dependant right atrial flutter.  I therefore performed empiric cavotricuspid isthmus ablation.  Complete bidirectional cavotricuspid isthmus block was achieved.  He was observed overnight without complication.  Discharge Exam: Blood pressure 138/77, pulse 62, temperature 97.6 F (36.4 C), temperature source Oral, resp. rate 20, height 5\' 10"  (1.778 m), weight 230 lb 9.6 oz (104.6 kg), SpO2 98.00%.   Physical Exam: Filed Vitals:   12/23/12 1630 12/23/12 2048 12/24/12 0037 12/24/12 0604  BP: 147/85 142/71 137/80 138/77  Pulse: 66 74 64 62  Temp:  98.6 F (37 C) 97.9 F (36.6 C) 97.6 F (36.4 C)  TempSrc:  Oral Oral Oral  Resp:  16 18 20   Height:      Weight:   230 lb 9.6 oz (104.6 kg)   SpO2: 96% 97% 96% 98%    GEN- The patient is well appearing, alert and oriented x 3 today.   Head- normocephalic, atraumatic Eyes-  Sclera clear, conjunctiva pink Ears- hearing intact Oropharynx- clear Neck- supple, no JVP Lymph- no cervical lymphadenopathy Lungs- Clear to ausculation bilaterally, normal work of breathing Heart- Regular rate and rhythm, no murmurs, rubs or gallops, PMI not laterally displaced GI- soft, NT, ND, + BS Extremities- no clubbing, cyanosis, or edema MS- no significant deformity or atrophy Skin- no rash or lesion Psych- euthymic mood, full affect Neuro- strength and sensation  are intact Labs:   Lab Results  Component Value Date   WBC 9.4 12/14/2012   HGB 15.8 12/14/2012   HCT 45.4 12/14/2012   MCV 86.7 12/14/2012   PLT 224.0 12/14/2012   No results found for this basename: NA, K, CL, CO2, BUN, CREATININE, CALCIUM, LABALBU, PROT, BILITOT, ALKPHOS, ALT, AST, GLUCOSE,  in the last 168 hours No results found for this basename: CKTOTAL, CKMB, CKMBINDEX, TROPONINI    No results found for this basename: CHOL   No results found for this basename: HDL   Lab Results  Component Value Date   LDLCALC 38 11/10/2012   Lab Results  Component Value Date   TRIG 251* 11/10/2012   No results found for this basename: CHOLHDL   No results found for this basename: LDLDIRECT      FOLLOW UP PLANS AND APPOINTMENTS  Future Appointments Provider Department Dept Phone   01/26/2013 3:30 PM Hillis Range, MD [facility] Main [facility] [place]) [phone]   02/21/2013 8:45 AM Mary-Margaret Daphine Deutscher, [name] [facility] [phone]       Medication List    STOP taking these medications       diltiazem 180 MG 24 hr capsule  Commonly known as:  CARDIZEM CD      TAKE these medications       albuterol 108 (90 BASE) MCG/ACT inhaler  Commonly known as:  PROVENTIL HFA;VENTOLIN HFA  Inhale 2 puffs into the lungs every 6 (six) hours as needed for wheezing.     aspirin 81 MG tablet  Take  81 mg by mouth daily.     fluticasone 50 MCG/ACT nasal spray  Commonly known as:  FLONASE  Place 2 sprays into the nose daily as needed for rhinitis or allergies.     lisinopril 10 MG tablet  Commonly known as:  PRINIVIL,ZESTRIL  Take 1 tablet (10 mg total) by mouth daily.     omeprazole 20 MG capsule  Commonly known as:  PRILOSEC  Take 20 mg by mouth daily as needed (for acid reflux). Acid reflux.         BRING ALL MEDICATIONS WITH YOU TO FOLLOW UP APPOINTMENTS  Time spent with patient to include physician time:30 minutes Signed: Hillis Range, MD 12/24/2012, 7:29  AM

## 2013-01-26 ENCOUNTER — Encounter: Payer: 59 | Admitting: Internal Medicine

## 2013-02-09 ENCOUNTER — Encounter: Payer: Self-pay | Admitting: Internal Medicine

## 2013-02-09 ENCOUNTER — Ambulatory Visit (INDEPENDENT_AMBULATORY_CARE_PROVIDER_SITE_OTHER): Payer: 59 | Admitting: Internal Medicine

## 2013-02-09 VITALS — BP 126/82 | HR 63 | Ht 70.0 in | Wt 235.6 lb

## 2013-02-09 DIAGNOSIS — I4892 Unspecified atrial flutter: Secondary | ICD-10-CM

## 2013-02-09 NOTE — Progress Notes (Signed)
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  Since his recent atrial flutter ablation, the patient reports doing very well.  Today, he denies symptoms of palpitations, chest pain, shortness of breath,  lower extremity edema, dizziness, presyncope, or syncope.  The patient is otherwise without complaint today.   Past Medical History  Diagnosis Date  . GERD (gastroesophageal reflux disease)   . Cervical disc disease     s/p cervical spine surgery 4/14  . HTN (hypertension)   . Hyperlipidemia   . Hypogonadism male   . Atrial flutter     a. s/p cervical spine surgery 4/14 => s/p DCCV;  b.  Echo 09/2012:  Mild LVH, EF 60-65%, Gr 2 DD.c. s/p atrial flutter ablation 12/23/2012 by Dr Johney Frame  . Anginal pain   . Asthma   . COPD (chronic obstructive pulmonary disease)   . Exertional shortness of breath   . Prediabetes     "no RX" (12/23/2012)  . Migraines     "once q 3-4 months" (12/23/2012)  . Headache     "weekly" (12/23/2012)  . Arthritis     "neck" (12/23/2012)   Past Surgical History  Procedure Laterality Date  . Treatment fistula anal  ~ 2007  . Wisdom tooth extraction    . Anterior cervical decomp/discectomy fusion N/A 09/22/2012    Procedure: ANTERIOR CERVICAL DECOMPRESSION/DISCECTOMY FUSION 2 LEVELS;  Surgeon: Karn Cassis, MD;  Location: [facility];  Service: Neurosurgery;  Laterality: N/A;  Cervical five-six Cervical six-seven Anterior cervical decompression/diskectomy/fusion  . Cardioversion N/A 09/23/2012    Procedure: CARDIOVERSION;  Surgeon: Gaylord Shih, MD;  Location: [facility];  Service: Cardiovascular;  Laterality: N/A;  . Atrial flutter ablation  12/23/2012    CTI ablation by Dr Johney Frame  . Tonsillectomy  1973    Current Outpatient Prescriptions  Medication Sig Dispense Refill  . albuterol (PROVENTIL HFA;VENTOLIN HFA) 108 (90 BASE) MCG/ACT inhaler Inhale 2 puffs  into the lungs every 6 (six) hours as needed for wheezing.      Marland Kitchen aspirin 81 MG tablet Take 81 mg by mouth daily.      . fluticasone (FLONASE) 50 MCG/ACT nasal spray Place 2 sprays into the nose daily as needed for rhinitis or allergies.      Marland Kitchen lisinopril (PRINIVIL,ZESTRIL) 10 MG tablet Take 1 tablet (10 mg total) by mouth daily.  90 tablet  3  . omeprazole (PRILOSEC) 20 MG capsule Take 20 mg by mouth daily as needed (for acid reflux). Acid reflux.       No current facility-administered medications for this visit.    Physical Exam: Filed Vitals:   02/09/13 1040  BP: 126/82  Pulse: 63  Height: 5\' 10"  (1.778 m)  Weight: 235 lb 9.6 oz (106.867 kg)    GEN- The patient is well appearing, alert and oriented x 3 today.   Head- normocephalic, atraumatic Eyes-  Sclera clear, conjunctiva pink Ears- hearing intact Oropharynx- clear Lungs- Clear to ausculation bilaterally, normal work of breathing Heart- Regular rate and rhythm, no murmurs, rubs or gallops, PMI not laterally displaced GI- soft, NT, ND, + BS Extremities- no clubbing, cyanosis, or edema  ekg today reveals sinus rhythm, incomplete RBBB, otherwise normal ekg  Assessment and Plan:  1. Atrial flutter Resolved s/p ablation No further EP workup planned I will see as needed going forward.

## 2013-02-09 NOTE — Patient Instructions (Signed)
**Note De-identified Arienne Gartin Obfuscation** Your physician recommends that you continue on your current medications as directed. Please refer to the Current Medication list given to you today.  Your physician recommends that you schedule a follow-up appointment in: as needed  

## 2013-02-21 ENCOUNTER — Encounter: Payer: Self-pay | Admitting: Nurse Practitioner

## 2013-02-21 ENCOUNTER — Ambulatory Visit (INDEPENDENT_AMBULATORY_CARE_PROVIDER_SITE_OTHER): Payer: 59 | Admitting: Nurse Practitioner

## 2013-02-21 VITALS — BP 164/84 | HR 51 | Temp 98.1°F | Ht 70.0 in | Wt 236.0 lb

## 2013-02-21 DIAGNOSIS — E785 Hyperlipidemia, unspecified: Secondary | ICD-10-CM

## 2013-02-21 DIAGNOSIS — I10 Essential (primary) hypertension: Secondary | ICD-10-CM

## 2013-02-21 DIAGNOSIS — R7309 Other abnormal glucose: Secondary | ICD-10-CM

## 2013-02-21 DIAGNOSIS — R7303 Prediabetes: Secondary | ICD-10-CM

## 2013-02-21 MED ORDER — LISINOPRIL 20 MG PO TABS: 20.0000 mg | ORAL_TABLET | Freq: Every day | ORAL | Status: DC

## 2013-02-21 NOTE — Progress Notes (Signed)
  Subjective:    Patient ID: Benjamin Hale, male    DOB: 1966/03/05, 47 y.o.   MRN: [id number]  Hyperlipidemia This is a chronic problem. The current episode started more than 1 year ago. The problem is controlled. Recent lipid tests were reviewed and are normal. Pertinent negatives include no chest pain. Treatments tried: on no meds. The current treatment provides moderate improvement of lipids. There are no compliance problems.  Risk factors for coronary artery disease include hypertension.  Hypertension This is a chronic problem. The current episode started more than 1 year ago. The problem has been gradually worsening since onset. The problem is uncontrolled. Pertinent negatives include no chest pain or palpitations. Risk factors for coronary artery disease include dyslipidemia, family history and male gender. Past treatments include ACE inhibitors. The current treatment provides mild improvement. There is no history of a thyroid problem.  Atrial flutter Started after having Cervical spine surgery. Was cardioverted and is now on cardizem. No symptoms- no compliant of palpitations. Pt had a atrial ablation two months ago. No problems at this time.  GERD Omeprazole  Working well for symptoms. Asthma Albuterol HFA- pt states he only uses it every 8-12 months-Usually during winter months    Review of Systems  Cardiovascular: Negative for chest pain and palpitations.  All other systems reviewed and are negative.       Objective:   Physical Exam  Constitutional: He is oriented to person, place, and time. He appears well-developed and well-nourished.  HENT:  Head: Normocephalic.  Right Ear: Hearing, tympanic membrane, external ear and ear canal normal.  Left Ear: Hearing, tympanic membrane, external ear and ear canal normal.  Nose: Right sinus exhibits maxillary sinus tenderness. Right sinus exhibits no frontal sinus tenderness. Left sinus exhibits maxillary sinus tenderness. Left  sinus exhibits no frontal sinus tenderness.  Mouth/Throat: Posterior oropharyngeal erythema present.  Eyes: EOM are normal. Pupils are equal, round, and reactive to light.  Neck: Normal range of motion. Neck supple. No thyromegaly present.  Cardiovascular: Normal rate, regular rhythm, normal heart sounds and intact distal pulses.   No murmur heard. Pulmonary/Chest: Effort normal and breath sounds normal. He has no wheezes. He has no rales.  Abdominal: Soft. Bowel sounds are normal.  Genitourinary: Prostate normal and penis normal.  Musculoskeletal: Normal range of motion.  Neurological: He is alert and oriented to person, place, and time.  Skin: Skin is warm and dry.  Psychiatric: He has a normal mood and affect. His behavior is normal. Judgment and thought content normal.   BP 154/85  Pulse 51  Temp(Src) 98.1 F (36.7 C) (Oral)  Ht 5\' 10"  (1.778 m)  Wt 236 lb (107.049 kg)  BMI 33.86 kg/m2     Assessment & Plan:  1. Pre-diabetes Continue to watch diet and exercsie  2. Other and unspecified hyperlipidemia Low fat diet - NMR, lipoprofile  3. HTN (hypertension) Law NA+ diet - CMP14+EGFR Increase lisinopril to 20 mg Qd  Follow up in 3 months  Mary-Margaret Daphine Deutscher, [name]

## 2013-02-21 NOTE — Patient Instructions (Signed)

## 2013-02-23 ENCOUNTER — Other Ambulatory Visit: Payer: Self-pay | Admitting: Nurse Practitioner

## 2013-02-23 DIAGNOSIS — E785 Hyperlipidemia, unspecified: Secondary | ICD-10-CM

## 2013-02-23 LAB — CMP14+EGFR
ALT: 48 IU/L — ABNORMAL HIGH (ref 0–44)
AST: 34 IU/L (ref 0–40)
Albumin: 4.6 g/dL (ref 3.5–5.5)
Alkaline Phosphatase: 108 IU/L (ref 39–117)
BUN/Creatinine Ratio: 15 (ref 9–20)
Chloride: 101 mmol/L (ref 97–108)
GFR calc Af Amer: 109 mL/min/{1.73_m2} (ref >59)
Potassium: 4.7 mmol/L (ref 3.5–5.2)
Sodium: 141 mmol/L (ref 134–144)
Total Bilirubin: 0.5 mg/dL (ref 0.0–1.2)

## 2013-02-23 LAB — NMR, LIPOPROFILE
Cholesterol: 117 mg/dL (ref <200)
LDL Particle Number: 1400 nmol/L — ABNORMAL HIGH (ref <1000)
LDL Size: 19.4 nm — ABNORMAL LOW (ref >20.5)
LP-IR Score: 83 — ABNORMAL HIGH (ref <=45)

## 2013-02-23 MED ORDER — ATORVASTATIN CALCIUM 40 MG PO TABS: 40.0000 mg | ORAL_TABLET | Freq: Every day | ORAL | Status: DC

## 2013-02-25 ENCOUNTER — Telehealth: Payer: Self-pay | Admitting: Nurse Practitioner

## 2013-02-28 ENCOUNTER — Telehealth: Payer: Self-pay | Admitting: Nurse Practitioner

## 2013-03-01 NOTE — Telephone Encounter (Signed)
Patient aware.

## 2013-03-01 NOTE — Telephone Encounter (Signed)
Copy of labs mailed lab did you get Hemoglobin alc

## 2013-03-02 ENCOUNTER — Ambulatory Visit (INDEPENDENT_AMBULATORY_CARE_PROVIDER_SITE_OTHER): Payer: 59 | Admitting: Family Medicine

## 2013-03-02 ENCOUNTER — Encounter: Payer: Self-pay | Admitting: Family Medicine

## 2013-03-02 VITALS — BP 145/93 | HR 69 | Temp 97.8°F | Ht 70.0 in | Wt 234.0 lb

## 2013-03-02 DIAGNOSIS — J209 Acute bronchitis, unspecified: Secondary | ICD-10-CM

## 2013-03-02 MED ORDER — METHYLPREDNISOLONE (PAK) 4 MG PO TABS: ORAL_TABLET | ORAL | Status: DC

## 2013-03-02 MED ORDER — AMOXICILLIN 875 MG PO TABS: 875.0000 mg | ORAL_TABLET | Freq: Two times a day (BID) | ORAL | Status: DC

## 2013-03-02 NOTE — Progress Notes (Signed)
  Subjective:    Patient ID: Benjamin Hale, male    DOB: Jan 12, 1966, 47 y.o.   MRN: [id number]  HPI This 47 y.o. male presents for evaluation of cough and congestion for over a week. He c/o persistent cough at night it is worse.  Review of Systems C/o cough and congestion No chest pain, SOB, HA, dizziness, vision change, N/V, diarrhea, constipation, dysuria, urinary urgency or frequency, myalgias, arthralgias or rash.     Objective:   Physical Exam Vital signs noted  Well developed well nourished male.  HEENT - Head atraumatic Normocephalic                Eyes - PERRLA, Conjuctiva - clear Sclera- Clear EOMI                Ears - EAC's Wnl TM's Wnl Gross Hearing WNL                Nose - Nares patent                 Throat - oropharanx injected w/o exudates Respiratory - Lungs CTA bilateral Cardiac - RRR S1 and S2 without murmur Neuro - Grossly intact.       Assessment & Plan:  Acute bronchitis - Plan: amoxicillin (AMOXIL) 875 MG tablet, methylPREDNIsolone (MEDROL DOSPACK) 4 MG tablet Take otc cough and cold medicine.  Push po fluids, rest, and take tylenol and motrin otc prn as directed

## 2013-03-02 NOTE — Patient Instructions (Signed)

## 2013-04-20 ENCOUNTER — Ambulatory Visit (INDEPENDENT_AMBULATORY_CARE_PROVIDER_SITE_OTHER): Payer: 59

## 2013-04-20 DIAGNOSIS — Z23 Encounter for immunization: Secondary | ICD-10-CM

## 2013-09-26 LAB — HM DIABETES EYE EXAM

## 2014-01-26 ENCOUNTER — Other Ambulatory Visit: Payer: Self-pay | Admitting: Nurse Practitioner

## 2014-01-27 NOTE — Telephone Encounter (Signed)
no more refills without being seen  

## 2014-01-27 NOTE — Telephone Encounter (Signed)
Last seen 9.17.14  MMM

## 2014-03-04 ENCOUNTER — Other Ambulatory Visit: Payer: Self-pay | Admitting: Nurse Practitioner

## 2014-03-06 NOTE — Telephone Encounter (Signed)
Last ov 9/15 for URI but last checkup for B/P 9/14. ntbs

## 2014-03-06 NOTE — Telephone Encounter (Signed)
no more refills without being seen  

## 2014-03-23 ENCOUNTER — Telehealth: Payer: Self-pay | Admitting: Nurse Practitioner

## 2014-03-23 NOTE — Telephone Encounter (Signed)
[facility]

## 2014-03-27 ENCOUNTER — Ambulatory Visit (INDEPENDENT_AMBULATORY_CARE_PROVIDER_SITE_OTHER): Payer: 59 | Admitting: Nurse Practitioner

## 2014-03-27 ENCOUNTER — Encounter: Payer: Self-pay | Admitting: Nurse Practitioner

## 2014-03-27 VITALS — BP 156/92 | HR 64 | Temp 97.7°F | Ht 70.0 in | Wt 233.8 lb

## 2014-03-27 DIAGNOSIS — Z125 Encounter for screening for malignant neoplasm of prostate: Secondary | ICD-10-CM

## 2014-03-27 DIAGNOSIS — J45909 Unspecified asthma, uncomplicated: Secondary | ICD-10-CM | POA: Insufficient documentation

## 2014-03-27 DIAGNOSIS — J4520 Mild intermittent asthma, uncomplicated: Secondary | ICD-10-CM

## 2014-03-27 DIAGNOSIS — R7309 Other abnormal glucose: Secondary | ICD-10-CM

## 2014-03-27 DIAGNOSIS — E785 Hyperlipidemia, unspecified: Secondary | ICD-10-CM

## 2014-03-27 DIAGNOSIS — E1169 Type 2 diabetes mellitus with other specified complication: Secondary | ICD-10-CM | POA: Insufficient documentation

## 2014-03-27 DIAGNOSIS — R739 Hyperglycemia, unspecified: Secondary | ICD-10-CM

## 2014-03-27 DIAGNOSIS — K219 Gastro-esophageal reflux disease without esophagitis: Secondary | ICD-10-CM | POA: Insufficient documentation

## 2014-03-27 DIAGNOSIS — I484 Atypical atrial flutter: Secondary | ICD-10-CM

## 2014-03-27 DIAGNOSIS — I10 Essential (primary) hypertension: Secondary | ICD-10-CM

## 2014-03-27 DIAGNOSIS — Z23 Encounter for immunization: Secondary | ICD-10-CM

## 2014-03-27 MED ORDER — LISINOPRIL 20 MG PO TABS: ORAL_TABLET | ORAL | Status: DC

## 2014-03-27 MED ORDER — OMEPRAZOLE 20 MG PO CPDR: 20.0000 mg | DELAYED_RELEASE_CAPSULE | Freq: Every day | ORAL | Status: DC | PRN

## 2014-03-27 NOTE — Patient Instructions (Signed)

## 2014-03-27 NOTE — Progress Notes (Signed)
Subjective:    Patient ID: Benjamin Hale, male    DOB: Aug 29, 1965, 48 y.o.   MRN: [id number]  Patient here today for follow up of chronic medical problems.  Hyperlipidemia This is a chronic problem. The current episode started more than 1 year ago. The problem is controlled. Recent lipid tests were reviewed and are normal. Pertinent negatives include no chest pain. Treatments tried: Was put on lipitor at last visit but forgot all about it so he has not been taking. The current treatment provides moderate improvement of lipids. There are no compliance problems.  Risk factors for coronary artery disease include hypertension.  Hypertension This is a chronic problem. The current episode started more than 1 year ago. The problem has been gradually worsening since onset. The problem is uncontrolled. Pertinent negatives include no chest pain or palpitations. Risk factors for coronary artery disease include dyslipidemia, family history and male gender. Past treatments include ACE inhibitors. The current treatment provides mild improvement. There is no history of a thyroid problem.  Atrial flutter Started after having Cervical spine surgery. Was cardioverted and is now on cardizem. No symptoms- no compliant of palpitations. Pt had a atrial ablation two months ago. No problems at this time.  GERD Omeprazole  Working well for symptoms. Asthma Albuterol HFA- He has not needed to use in over a year.    Review of Systems  Cardiovascular: Negative for chest pain and palpitations.  All other systems reviewed and are negative.      Objective:   Physical Exam  Constitutional: He is oriented to person, place, and time. He appears well-developed and well-nourished.  HENT:  Head: Normocephalic.  Right Ear: Hearing, tympanic membrane, external ear and ear canal normal.  Left Ear: Hearing, tympanic membrane, external ear and ear canal normal.  Nose: Right sinus exhibits maxillary sinus tenderness.  Right sinus exhibits no frontal sinus tenderness. Left sinus exhibits maxillary sinus tenderness. Left sinus exhibits no frontal sinus tenderness.  Mouth/Throat: Posterior oropharyngeal erythema present.  Eyes: EOM are normal. Pupils are equal, round, and reactive to light.  Neck: Normal range of motion. Neck supple. No thyromegaly present.  Cardiovascular: Normal rate, regular rhythm, normal heart sounds and intact distal pulses.   No murmur heard. Pulmonary/Chest: Effort normal and breath sounds normal. He has no wheezes. He has no rales.  Abdominal: Soft. Bowel sounds are normal.  Genitourinary: Prostate normal and penis normal.  Musculoskeletal: Normal range of motion.  Neurological: He is alert and oriented to person, place, and time.  Skin: Skin is warm and dry.  Psychiatric: He has a normal mood and affect. His behavior is normal. Judgment and thought content normal.   BP 156/92  Pulse 64  Temp(Src) 97.7 F (36.5 C) (Oral)  Ht 5\' 10"  (1.778 m)  Wt 233 lb 12.8 oz (106.051 kg)  BMI 33.55 kg/m2     Assessment & Plan:  1. Hyperlipidemia with target LDL less than 100 Low fat diet - NMR, lipoprofile  2. Asthma, mild intermittent, uncomplicated  3. Essential hypertension, benign Low NA+ diet - lisinopril (PRINIVIL,ZESTRIL) 20 MG tablet; TAKE ONE (1) TABLET EACH DAY  Dispense: 30 tablet; Refill: 5 - CMP14+EGFR  4. Atypical atrial flutter  5. Gastroesophageal reflux disease without esophagitis Avoid spicy and fatty foods - omeprazole (PRILOSEC) 20 MG capsule; Take 1 capsule (20 mg total) by mouth daily as needed (for acid reflux). Acid reflux.  Dispense: 30 capsule; Refill: 5  6. Prostate cancer screening - PSA, total and free  Labs pending- will decide on lipitor once we get labs back Health maintenance reviewed Diet and exercise encouraged Continue all meds Follow up  In 6 months   [place], [name]

## 2014-03-28 ENCOUNTER — Other Ambulatory Visit: Payer: Self-pay | Admitting: Nurse Practitioner

## 2014-03-28 LAB — CMP14+EGFR
ALT: 60 IU/L — AB (ref 0–44)
AST: 37 IU/L (ref 0–40)
Albumin/Globulin Ratio: 1.7 (ref 1.1–2.5)
Albumin: 4.5 g/dL (ref 3.5–5.5)
Alkaline Phosphatase: 85 IU/L (ref 39–117)
BUN/Creatinine Ratio: 14 (ref 9–20)
BUN: 14 mg/dL (ref 6–24)
CALCIUM: 9.2 mg/dL (ref 8.7–10.2)
CO2: 22 mmol/L (ref 18–29)
Chloride: 100 mmol/L (ref 97–108)
Creatinine, Ser: 1 mg/dL (ref 0.76–1.27)
GFR calc Af Amer: 102 mL/min/{1.73_m2} (ref >59)
GFR calc non Af Amer: 89 mL/min/{1.73_m2} (ref >59)
GLUCOSE: 155 mg/dL — AB (ref 65–99)
Globulin, Total: 2.6 g/dL (ref 1.5–4.5)
POTASSIUM: 4.1 mmol/L (ref 3.5–5.2)
SODIUM: 140 mmol/L (ref 134–144)
Total Bilirubin: 0.6 mg/dL (ref 0.0–1.2)
Total Protein: 7.1 g/dL (ref 6.0–8.5)

## 2014-03-28 LAB — NMR, LIPOPROFILE
Cholesterol: 114 mg/dL (ref 100–199)
HDL Cholesterol by NMR: 18 mg/dL — ABNORMAL LOW (ref >39)
HDL Particle Number: 21.8 umol/L — ABNORMAL LOW (ref >=30.5)
LDL PARTICLE NUMBER: 799 nmol/L (ref <1000)
LDL Size: 19.4 nm (ref >20.5)
LDLC SERPL CALC-MCNC: 37 mg/dL (ref 0–99)
LP-IR Score: 74 — ABNORMAL HIGH (ref <=45)
Small LDL Particle Number: 699 nmol/L — ABNORMAL HIGH (ref <=527)
Triglycerides by NMR: 295 mg/dL — ABNORMAL HIGH (ref 0–149)

## 2014-03-28 LAB — PSA, TOTAL AND FREE
PSA FREE PCT: 16.7 %
PSA FREE: 0.1 ng/mL
PSA: 0.6 ng/mL (ref 0.0–4.0)

## 2014-03-28 LAB — POCT GLYCOSYLATED HEMOGLOBIN (HGB A1C): HEMOGLOBIN A1C: 6.7

## 2014-03-28 MED ORDER — METFORMIN HCL 500 MG PO TABS: 500.0000 mg | ORAL_TABLET | Freq: Two times a day (BID) | ORAL | Status: DC

## 2014-03-28 NOTE — Addendum Note (Signed)
Addended by: Bennie PieriniMARTIN, MARY-MARGARET on: 03/28/2014 12:30 PM   Modules accepted: Orders

## 2014-03-29 ENCOUNTER — Telehealth: Payer: Self-pay | Admitting: Family Medicine

## 2014-03-29 NOTE — Telephone Encounter (Signed)
Message copied by Azalee CourseFULP, ASHLEY on Wed Mar 29, 2014 11:20 AM ------      Message from: Bennie PieriniMARTIN, MARY-MARGARET      Created: Tue Mar 28, 2014  1:55 PM       Diabetic- added metformin to meds- needs to take BID- rx snet to pharmacy- needs appointment with clinical pharmacist to discuss diabetes and to get blood sugar meter. ------

## 2014-03-29 NOTE — Telephone Encounter (Signed)
Pt aware of lab results.  He is aware of Rx at pharmacy.  He will call back to make diabetic appointment with Pharm-D.  rs

## 2014-03-29 NOTE — Telephone Encounter (Signed)
Message copied by Azalee CourseFULP, ASHLEY on Wed Mar 29, 2014 11:20 AM ------      Message from: Bennie PieriniMARTIN, MARY-MARGARET      Created: Tue Mar 28, 2014  9:50 AM       Fasting blood sugar elevated and has been - please add HGBA!C- probable diabetes      Cholesterol looks good except trig are elevated but that is probably related to diabetes as well      PSA looks great      Continue current meds- low fat diet and exercise and recheck in 3 months             ------

## 2014-04-07 ENCOUNTER — Other Ambulatory Visit: Payer: Self-pay | Admitting: Nurse Practitioner

## 2014-04-24 ENCOUNTER — Ambulatory Visit: Payer: 59

## 2014-05-01 ENCOUNTER — Encounter: Payer: Self-pay | Admitting: Pharmacist

## 2014-05-01 ENCOUNTER — Ambulatory Visit (INDEPENDENT_AMBULATORY_CARE_PROVIDER_SITE_OTHER): Payer: 59 | Admitting: Pharmacist

## 2014-05-01 VITALS — BP 150/92 | HR 78 | Ht 70.0 in | Wt 236.0 lb

## 2014-05-01 DIAGNOSIS — E785 Hyperlipidemia, unspecified: Secondary | ICD-10-CM

## 2014-05-01 DIAGNOSIS — E119 Type 2 diabetes mellitus without complications: Secondary | ICD-10-CM

## 2014-05-01 DIAGNOSIS — Z7985 Long-term (current) use of injectable non-insulin antidiabetic drugs: Secondary | ICD-10-CM | POA: Insufficient documentation

## 2014-05-01 DIAGNOSIS — E1169 Type 2 diabetes mellitus with other specified complication: Secondary | ICD-10-CM

## 2014-05-01 DIAGNOSIS — I10 Essential (primary) hypertension: Secondary | ICD-10-CM

## 2014-05-01 MED ORDER — ONETOUCH DELICA LANCETS 33G MISC: Status: DC

## 2014-05-01 MED ORDER — GLUCOSE BLOOD VI STRP: ORAL_STRIP | Status: DC

## 2014-05-01 MED ORDER — LISINOPRIL 40 MG PO TABS: 40.0000 mg | ORAL_TABLET | Freq: Every day | ORAL | Status: DC

## 2014-05-01 NOTE — Progress Notes (Signed)
Subjective:    Benjamin Hale is a 48 y.o. male who presents for an initial evaluation of Type 2 diabetes mellitus.  Current symptoms/problems include none  The patient was initially diagnosed with Type 2 diabetes mellitus 03/28/2014 based on A1c of 6.7% and elevated BG of 155.  Known diabetic complications: none Cardiovascular risk factors: diabetes mellitus, dyslipidemia, hypertension, male gender, obesity (BMI >= 30 kg/m2) and sedentary lifestyle Current diabetic medications include none. Has been prescribed metformin but has not started yet.  Eye exam current (within one year): yes Weight trend: stable Prior visit with dietician: no Current diet: in general, an "unhealthy" diet Current exercise: none  Current monitoring regimen: none Home blood sugar records: n/a Any episodes of hypoglycemia? no  Is He on ACE inhibitor or angiotensin II receptor blocker?  Yes  lisinopril (Prinivil) 20mg  1 tablet daily    The following portions of the patient's history were reviewed and updated as appropriate: allergies, current medications, past family history, past medical history, past social history, past surgical history and problem list.     Objective:    BP 150/92 mmHg  Pulse 78  Ht 5\' 10"  (1.778 m)  Wt 236 lb (107.049 kg)  BMI 33.86 kg/m2  General:  alert, cooperative, no distress and mildly obese  Oropharynx: normal findings: lips normal without lesions and gums healthy   Eyes:  negative findings: lids and lashes normal   Lab Review GLUCOSE (mg/dL)  Date Value  16/10/960410/05/2014 155*  02/21/2013 134*   GLUCOSE, BLD (mg/dL)  Date Value  54/09/811907/06/2012 167*  11/10/2012 147*  10/14/2012 272*   CO2  Date Value  03/27/2014 22 mmol/L  02/21/2013 24 mmol/L  12/14/2012 26 mEq/L   BUN (mg/dL)  Date Value  14/78/295610/05/2014 14  02/21/2013 14  12/14/2012 16  11/10/2012 13  10/14/2012 15   CREAT (mg/dL)  Date Value  21/30/865705/28/2014 0.88   CREATININE, SER (mg/dL)  Date Value   84/69/629510/05/2014 1.00  02/21/2013 0.96  12/14/2012 0.9   A1c = 6.7% (03/28/2014)   Assessment:    Diabetes Mellitus type II, under inadequate control.   HTN - elevated today and at last visit Hyperlipidemia - LDL at goal with atorvastatin but HDL low and Tg elevated.   Plan:    1.  Rx changes:   start Metfromin 500mg  1 tablet daily for 1 weeks then increase to 1 tablet bid - take with food  Increase lisinopril to 40mg  1 tablet daily 2.  Education: Reviewed 'ABCs' of diabetes management (respective goals in parentheses):  A1C (<6.5%), blood pressure (<140/90), and cholesterol (LDL <100).  3.   dietary modifications: Discussed CHO counting, CHO serving size recommendations and grams of CHO per meal ( 50 to 60 grams) and sncacks (20 grams).   Increase non-starchy vegetables - carrots, green bean, squash, zucchini, tomatoes, onions, peppers, spinach and other green leafy vegetables, cabbage, lettuce, cucumbers, asparagus, okra (not fried), eggplant limit sugar and processed foods (cakes, cookies, ice cream, crackers and chips) Increase fresh fruit but limit serving sizes 1/2 cup or about the size of tennis or baseball limit red meat to no more than 1-2 times per week (serving size about the size of your palm)  Choose whole grains / lean proteins - whole wheat bread, quinoa, whole grain rice (1/2 cup), fish, chicken, [place] 4.  increased exercise - goal of 150minutes or more per week. 5.  Patient given glucometer - one touch verio in office today.  Recommend he check qd to qod.  Taught to use glucometer and patient demonstrated understanding by check BG in office today.  Rx for test strips and lancet sent to his pharmacy. 6.  Follow up: 3 months    Henrene Pastorammy Tedford Berg, PharmD, CPP, CDE

## 2014-05-01 NOTE — Patient Instructions (Signed)
Diabetes and Standards of Medical Care   Diabetes is complicated. You may find that your diabetes team includes a dietitian, nurse, diabetes educator, eye doctor, and more. To help everyone know what is going on and to help you get the care you deserve, the following schedule of care was developed to help keep you on track. Below are the tests, exams, vaccines, medicines, education, and plans you will need.  Blood Glucose Goals Prior to meals = 80 - 130 Within 2 hours of the start of a meal = less than 180  HbA1c test (goal is less than 6.5% - your last value was 6.7%) This test shows how well you have controlled your glucose over the past 2 to 3 months. It is used to see if your diabetes management plan needs to be adjusted.   It is performed at least 2 times a year if you are meeting treatment goals.  It is performed 4 times a year if therapy has changed or if you are not meeting treatment goals.  Blood pressure test  This test is performed at every routine medical visit. The goal is less than 140/90 mmHg for most people, but 130/80 mmHg in some cases. Ask your health care provider about your goal.  Dental exam  Follow up with the dentist regularly.  Eye exam  If you are diagnosed with type 1 diabetes as a child, get an exam upon reaching the age of 10 years or older and have had diabetes for 3 to 5 years. Yearly eye exams are recommended after that initial eye exam.  If you are diagnosed with type 1 diabetes as an adult, get an exam within 5 years of diagnosis and then yearly.  If you are diagnosed with type 2 diabetes, get an exam as soon as possible after the diagnosis and then yearly.  Foot care exam  Visual foot exams are performed at every routine medical visit. The exams check for cuts, injuries, or other problems with the feet.  A comprehensive foot exam should be done yearly. This includes visual inspection as well as assessing foot pulses and testing for loss of  sensation.  Check your feet nightly for cuts, injuries, or other problems with your feet. Tell your health care provider if anything is not healing.  Kidney function test (urine microalbumin)  This test is performed once a year.  Type 1 diabetes: The first test is performed 5 years after diagnosis.  Type 2 diabetes: The first test is performed at the time of diagnosis.  A serum creatinine and estimated glomerular filtration rate (eGFR) test is done once a year to assess the level of chronic kidney disease (CKD), if present.  Lipid profile (cholesterol, HDL, LDL, triglycerides)  Performed every 5 years for most people.  The goal for LDL is less than 100 mg/dL. If you are at high risk, the goal is less than 70 mg/dL.  The goal for HDL is 40 mg/dL to 50 mg/dL for men and 50 mg/dL to 60 mg/dL for women. An HDL cholesterol of 60 mg/dL or higher gives some protection against heart disease.  The goal for triglycerides is less than 150 mg/dL.  Influenza vaccine, pneumococcal vaccine, and hepatitis B vaccine  The influenza vaccine is recommended yearly.  The pneumococcal vaccine is generally given once in a lifetime. However, there are some instances when another vaccination is recommended. Check with your health care provider.  The hepatitis B vaccine is also recommended for adults with diabetes.    Diabetes self-management education  Education is recommended at diagnosis and ongoing as needed.  Treatment plan  Your treatment plan is reviewed at every medical visit.  Document Released: 03/30/2009 Document Revised: 02/02/2013 Document Reviewed: 11/02/2012 [employer] Patient Information 2014 [employer].   

## 2014-05-09 IMAGING — CR DG CERVICAL SPINE COMPLETE 4+V
1 series · 1 of 1 positions shown · non-contrast
Comparison: Cervical spine radiographs - 07/26/2012; cervical spine
MRI - 08/05/2012

CLINICAL DATA: ACDF C5 - C6; C6 - C7

CERVICAL SPINE - COMPLETE 4+ VIEW

[view not recorded]
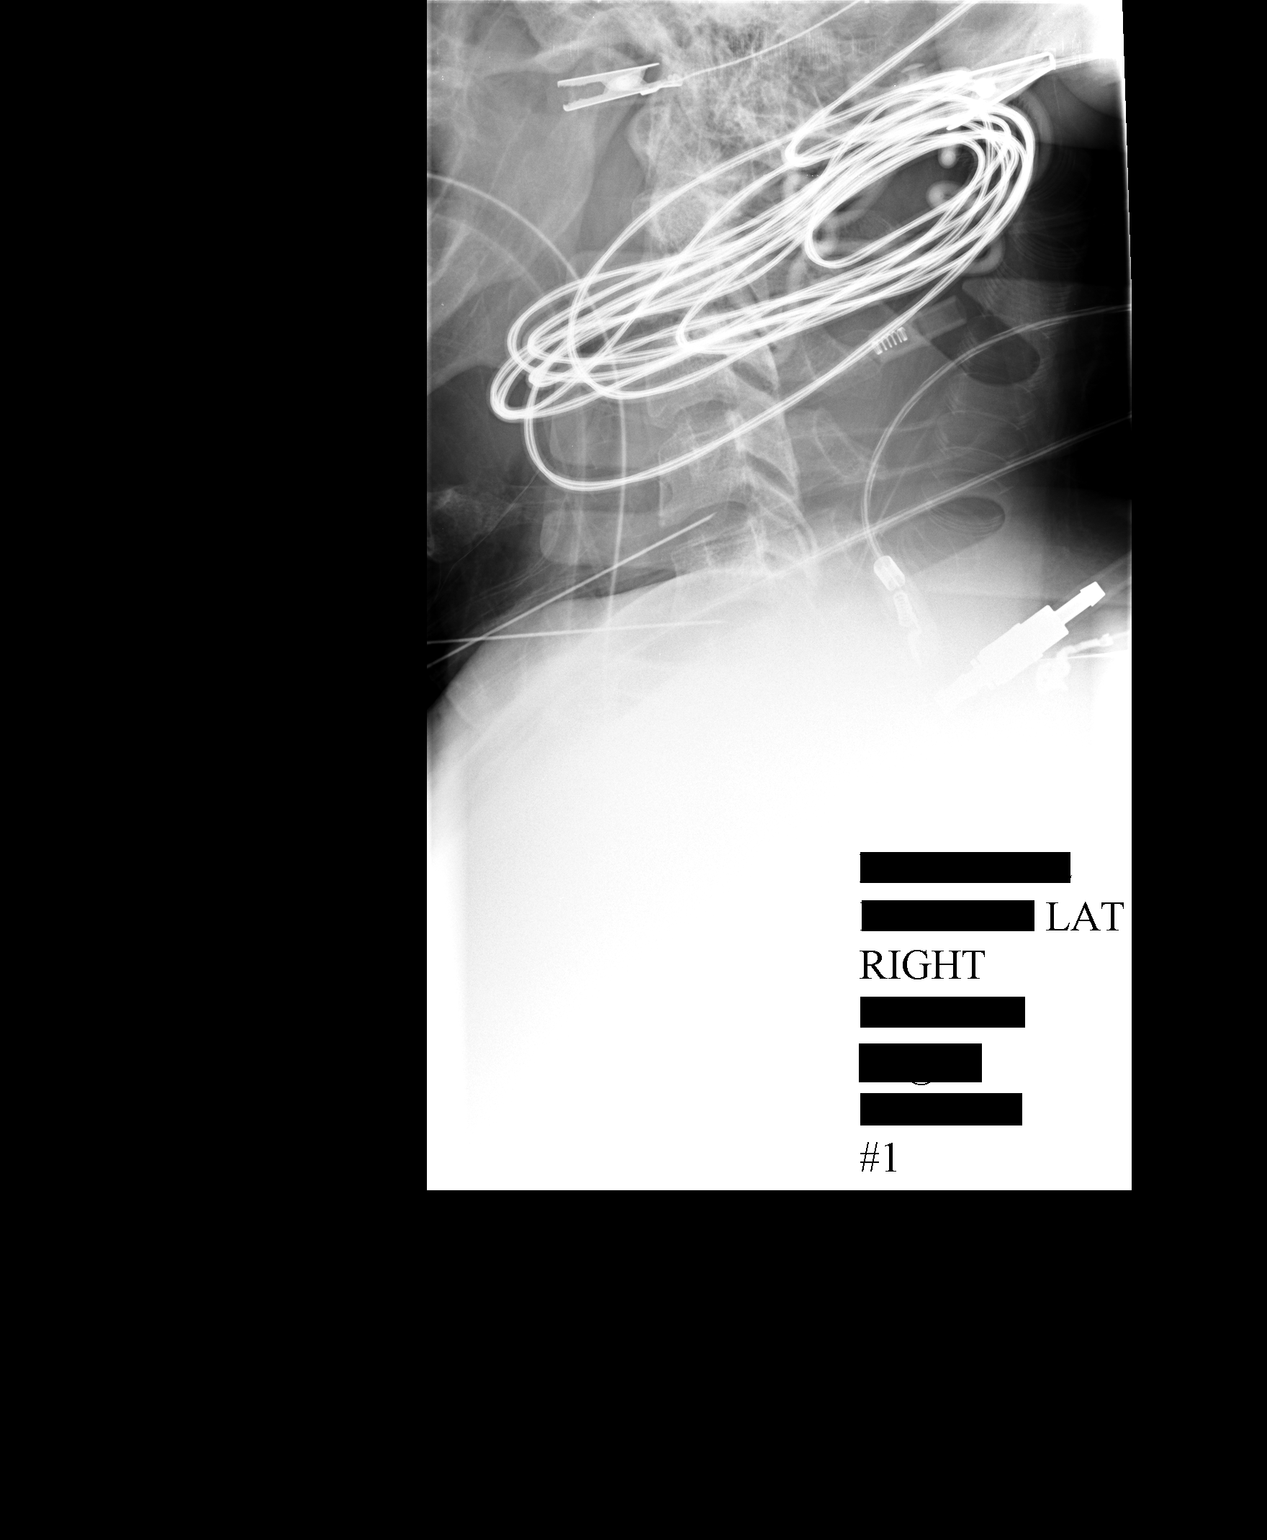

[1 of 1 positions shown; findings below may reference images not displayed]

FINDINGS: Four spot lateral intraoperative radiographs of the cervical spine
are provided for review.

Radiograph labeled #1 demonstrates radiopaque needle tips overlying
the anterior aspect of the C4 - C5 and C5 - C6 intervertebral disc
spaces.  An endotracheal tube overlies the tracheal air column with
tip excluded from view.

Radiograph labeled #2 demonstrates a radiopaque needle tip
overlying the soft tissues anterior to the C5 - C6 intervertebral
disc space.

Radiographs labeled #3 and #4 demonstrate the sequela of interval
C5 - C6 and C6 - C7 ACDF and intervertebral disc space replacement,
though note, evaluation of the inferior aspect of the hardware is
degraded secondary to overlying osseous and soft tissue structures.
A surgical drain overlies the operative site.  There is a minimal
amount of expected prevertebral soft tissue swelling at this level.
IMPRESSION: Post C5 - C6 and C6 - C7 ACDF and intervertebral disc space
replacement.

## 2014-05-25 ENCOUNTER — Encounter (HOSPITAL_COMMUNITY): Payer: Self-pay | Admitting: Internal Medicine

## 2014-06-06 ENCOUNTER — Encounter: Payer: Self-pay | Admitting: *Deleted

## 2014-08-10 ENCOUNTER — Ambulatory Visit (INDEPENDENT_AMBULATORY_CARE_PROVIDER_SITE_OTHER): Payer: 59 | Admitting: Nurse Practitioner

## 2014-08-10 ENCOUNTER — Encounter: Payer: Self-pay | Admitting: Nurse Practitioner

## 2014-08-10 VITALS — BP 143/91 | HR 63 | Temp 97.7°F | Ht 70.0 in | Wt 227.2 lb

## 2014-08-10 DIAGNOSIS — K219 Gastro-esophageal reflux disease without esophagitis: Secondary | ICD-10-CM

## 2014-08-10 DIAGNOSIS — E119 Type 2 diabetes mellitus without complications: Secondary | ICD-10-CM

## 2014-08-10 DIAGNOSIS — E1169 Type 2 diabetes mellitus with other specified complication: Secondary | ICD-10-CM

## 2014-08-10 DIAGNOSIS — I10 Essential (primary) hypertension: Secondary | ICD-10-CM

## 2014-08-10 DIAGNOSIS — E785 Hyperlipidemia, unspecified: Secondary | ICD-10-CM

## 2014-08-10 LAB — POCT GLYCOSYLATED HEMOGLOBIN (HGB A1C): HEMOGLOBIN A1C: 5.8

## 2014-08-10 MED ORDER — ATORVASTATIN CALCIUM 40 MG PO TABS: 40.0000 mg | ORAL_TABLET | Freq: Every day | ORAL | Status: DC

## 2014-08-10 MED ORDER — LISINOPRIL 40 MG PO TABS: 40.0000 mg | ORAL_TABLET | Freq: Every day | ORAL | Status: DC

## 2014-08-10 MED ORDER — ESOMEPRAZOLE MAGNESIUM 40 MG PO CPDR: DELAYED_RELEASE_CAPSULE | ORAL | Status: DC

## 2014-08-10 MED ORDER — HYDROCHLOROTHIAZIDE 12.5 MG PO CAPS: 12.5000 mg | ORAL_CAPSULE | Freq: Every day | ORAL | Status: DC

## 2014-08-10 MED ORDER — OMEPRAZOLE 20 MG PO CPDR: 20.0000 mg | DELAYED_RELEASE_CAPSULE | Freq: Every day | ORAL | Status: DC | PRN

## 2014-08-10 NOTE — Patient Instructions (Signed)
Exercise to Stay Healthy Exercise helps you become and stay healthy. EXERCISE IDEAS AND TIPS Choose exercises that:  You enjoy.  Fit into your day. You do not need to exercise really hard to be healthy. You can do exercises at a slow or medium level and stay healthy. You can:  Stretch before and after working out.  Try yoga, Pilates, or tai chi.  Lift weights.  Walk fast, swim, jog, run, climb stairs, bicycle, dance, or rollerskate.  Take aerobic classes. Exercises that burn about 150 calories:  Running 1  miles in 15 minutes.  Playing volleyball for 45 to 60 minutes.  Washing and waxing a car for 45 to 60 minutes.  Playing touch football for 45 minutes.  Walking 1  miles in 35 minutes.  Pushing a stroller 1  miles in 30 minutes.  Playing basketball for 30 minutes.  Raking leaves for 30 minutes.  Bicycling 5 miles in 30 minutes.  Walking 2 miles in 30 minutes.  Dancing for 30 minutes.  Shoveling snow for 15 minutes.  Swimming laps for 20 minutes.  Walking up stairs for 15 minutes.  Bicycling 4 miles in 15 minutes.  Gardening for 30 to 45 minutes.  Jumping rope for 15 minutes.  Washing windows or floors for 45 to 60 minutes. Document Released: 07/05/2010 Document Revised: 08/25/2011 Document Reviewed: 07/05/2010 [employer] Patient Information 2015 [employer]. This information is not intended to replace advice given to you by your health care provider. Make sure you discuss any questions you have with your health care provider.  

## 2014-08-10 NOTE — Progress Notes (Signed)
Subjective:    Patient ID: Benjamin Hale, male    DOB: Dec 08, 1965, 49 y.o.   MRN: [id number]  Patient here today for follow up of chronic medical problems. Only complaint today is a slight burning sensation side of left foot.  Diabetes He presents for his follow-up diabetic visit. He has type 2 diabetes mellitus. No MedicAlert identification noted. The initial diagnosis of diabetes was made 4 months ago. His disease course has been improving. There are no hypoglycemic associated symptoms. Pertinent negatives for diabetes include no chest pain, no foot paresthesias, no polydipsia, no polyphagia, no polyuria and no weight loss. There are no hypoglycemic complications. Symptoms are worsening. There are no diabetic complications. Risk factors for coronary artery disease include dyslipidemia, diabetes mellitus, family history, hypertension and male sex. Current diabetic treatment includes oral agent (monotherapy). He is compliant with treatment most of the time. His weight is stable. He is following a diabetic diet. When asked about meal planning, he reported none. He has not had a previous visit with a dietitian. He rarely participates in exercise. His home blood glucose trend is fluctuating minimally. His breakfast blood glucose is taken between 9-10 am. His breakfast blood glucose range is generally 130-140 mg/dl. His overall blood glucose range is 130-140 mg/dl. An ACE inhibitor/angiotensin II receptor blocker is being taken. He does not see a podiatrist.Eye exam is not current.  Hypertension This is a chronic problem. The current episode started more than 1 year ago. The problem has been gradually worsening since onset. The problem is uncontrolled. Pertinent negatives include no chest pain or palpitations. Risk factors for coronary artery disease include dyslipidemia, family history and male gender. Past treatments include ACE inhibitors. The current treatment provides mild improvement. There is no  history of a thyroid problem.  Hyperlipidemia This is a chronic problem. The current episode started more than 1 year ago. The problem is controlled. Recent lipid tests were reviewed and are normal. Pertinent negatives include no chest pain. Treatments tried: Was put on lipitor at last visit but forgot all about it so he has not been taking. The current treatment provides moderate improvement of lipids. There are no compliance problems.  Risk factors for coronary artery disease include hypertension, dyslipidemia and male sex.  Atrial flutter Started after having Cervical spine surgery. Was cardioverted and is now on cardizem. No symptoms- no compliant of palpitations. Pt had a atrial ablation two months ago. No problems at this time.  GERD Omeprazole  Working well for symptoms. Asthma Albuterol HFA- He has not needed to use in over a year.    Review of Systems  Constitutional: Negative for weight loss.  Cardiovascular: Negative for chest pain and palpitations.  Endocrine: Negative for polydipsia, polyphagia and polyuria.  All other systems reviewed and are negative.      Objective:   Physical Exam  Constitutional: He is oriented to person, place, and time. He appears well-developed and well-nourished.  HENT:  Head: Normocephalic.  Right Ear: Hearing, tympanic membrane, external ear and ear canal normal.  Left Ear: Hearing, tympanic membrane, external ear and ear canal normal.  Nose: Right sinus exhibits maxillary sinus tenderness. Right sinus exhibits no frontal sinus tenderness. Left sinus exhibits maxillary sinus tenderness. Left sinus exhibits no frontal sinus tenderness.  Mouth/Throat: Posterior oropharyngeal erythema present.  Eyes: EOM are normal. Pupils are equal, round, and reactive to light.  Neck: Normal range of motion. Neck supple. No thyromegaly present.  Cardiovascular: Normal rate, regular rhythm, normal heart sounds and  intact distal pulses.   No murmur  heard. Pulmonary/Chest: Effort normal and breath sounds normal. He has no wheezes. He has no rales.  Abdominal: Soft. Bowel sounds are normal.  Genitourinary: Prostate normal and penis normal.  Musculoskeletal: Normal range of motion.  Neurological: He is alert and oriented to person, place, and time.  Skin: Skin is warm and dry.  Psychiatric: He has a normal mood and affect. His behavior is normal. Judgment and thought content normal.   BP 143/91 mmHg  Pulse 63  Temp(Src) 97.7 F (36.5 C) (Oral)  Ht _0  (1.778 m)  Wt 227 lb 3.2 oz (103.057 kg)  BMI 32.60 kg/m2  Results for orders placed or performed in visit on 08/10/14  POCT glycosylated hemoglobin (Hb A1C)  Result Value Ref Range   Hemoglobin A1C 5.8%        Assessment & Plan:  1. Type 2 diabetes mellitus, controlled Continue to watch carbs - POCT glycosylated hemoglobin (Hb A1C) - Microalbumin, urine  2. Essential hypertension Do  Not add salt to diet - CMP14+EGFR - lisinopril (PRINIVIL,ZESTRIL) 40 MG tablet; Take 1 tablet (40 mg total) by mouth daily.  Dispense: 90 tablet; Refill: 3 - hydrochlorothiazide (MICROZIDE) 12.5 MG capsule; Take 1 capsule (12.5 mg total) by mouth daily.  Dispense: 30 capsule; Refill: 5  3. Hyperlipidemia associated with type 2 diabetes mellitus Low fat diet - NMR, lipoprofile - atorvastatin (LIPITOR) 40 MG tablet; Take 1 tablet (40 mg total) by mouth daily.  Dispense: 90 tablet; Refill: 3  4. Gastroesophageal reflux disease without esophagitis Avoid spicy foods Do not eat 2 hours prior to bedtime - omeprazole (PRILOSEC) 20 MG capsule; Take 1 capsule (20 mg total) by mouth daily as needed (for acid reflux). Acid reflux.  Dispense: 30 capsule; Refill: 5 - esomeprazole (NEXIUM) 40 MG capsule; TAKE ONE (1) CAPSULE EACH DAY  Dispense: 30 capsule; Refill: 5    Labs pending Health maintenance reviewed Diet and exercise encouraged Continue all meds Follow up  In 3 months   [place], [name]

## 2014-08-11 LAB — CMP14+EGFR
ALK PHOS: 72 IU/L (ref 39–117)
ALT: 38 IU/L (ref 0–44)
AST: 30 IU/L (ref 0–40)
Albumin/Globulin Ratio: 1.5 (ref 1.1–2.5)
Albumin: 4.4 g/dL (ref 3.5–5.5)
BUN/Creatinine Ratio: 14 (ref 9–20)
BUN: 13 mg/dL (ref 6–24)
Bilirubin Total: 0.3 mg/dL (ref 0.0–1.2)
CHLORIDE: 100 mmol/L (ref 97–108)
CO2: 20 mmol/L (ref 18–29)
Calcium: 9.3 mg/dL (ref 8.7–10.2)
Creatinine, Ser: 0.9 mg/dL (ref 0.76–1.27)
GFR calc Af Amer: 116 mL/min/{1.73_m2} (ref >59)
GFR, EST NON AFRICAN AMERICAN: 101 mL/min/{1.73_m2} (ref >59)
GLUCOSE: 119 mg/dL — AB (ref 65–99)
Globulin, Total: 2.9 g/dL (ref 1.5–4.5)
POTASSIUM: 4.3 mmol/L (ref 3.5–5.2)
SODIUM: 139 mmol/L (ref 134–144)
Total Protein: 7.3 g/dL (ref 6.0–8.5)

## 2014-08-11 LAB — NMR, LIPOPROFILE
Cholesterol: 127 mg/dL (ref 100–199)
HDL Cholesterol by NMR: 21 mg/dL — ABNORMAL LOW (ref >39)
HDL Particle Number: 21.2 umol/L — ABNORMAL LOW (ref >=30.5)
LDL PARTICLE NUMBER: 1146 nmol/L — AB (ref <1000)
LDL SIZE: 19.9 nm (ref >20.5)
LDL-C: 77 mg/dL (ref 0–99)
LP-IR Score: 71 — ABNORMAL HIGH (ref <=45)
Small LDL Particle Number: 831 nmol/L — ABNORMAL HIGH (ref <=527)
TRIGLYCERIDES BY NMR: 147 mg/dL (ref 0–149)

## 2014-08-11 LAB — MICROALBUMIN, URINE

## 2014-11-20 ENCOUNTER — Other Ambulatory Visit: Payer: Self-pay | Admitting: *Deleted

## 2014-11-20 ENCOUNTER — Telehealth: Payer: Self-pay | Admitting: Nurse Practitioner

## 2014-11-20 DIAGNOSIS — I10 Essential (primary) hypertension: Secondary | ICD-10-CM

## 2014-11-20 DIAGNOSIS — E1169 Type 2 diabetes mellitus with other specified complication: Secondary | ICD-10-CM

## 2014-11-20 DIAGNOSIS — E119 Type 2 diabetes mellitus without complications: Secondary | ICD-10-CM

## 2014-11-20 DIAGNOSIS — E785 Hyperlipidemia, unspecified: Secondary | ICD-10-CM

## 2014-11-20 NOTE — Telephone Encounter (Signed)
Orders placed. Patient notified.

## 2014-12-19 ENCOUNTER — Telehealth: Payer: Self-pay | Admitting: Nurse Practitioner

## 2014-12-22 ENCOUNTER — Ambulatory Visit (INDEPENDENT_AMBULATORY_CARE_PROVIDER_SITE_OTHER): Payer: 59 | Admitting: Nurse Practitioner

## 2014-12-22 ENCOUNTER — Encounter: Payer: Self-pay | Admitting: Nurse Practitioner

## 2014-12-22 VITALS — BP 104/71 | HR 82 | Temp 96.8°F | Ht 70.0 in | Wt 228.0 lb

## 2014-12-22 DIAGNOSIS — E119 Type 2 diabetes mellitus without complications: Secondary | ICD-10-CM

## 2014-12-22 DIAGNOSIS — E1169 Type 2 diabetes mellitus with other specified complication: Secondary | ICD-10-CM | POA: Diagnosis not present

## 2014-12-22 DIAGNOSIS — E785 Hyperlipidemia, unspecified: Secondary | ICD-10-CM | POA: Diagnosis not present

## 2014-12-22 DIAGNOSIS — I10 Essential (primary) hypertension: Secondary | ICD-10-CM | POA: Diagnosis not present

## 2014-12-22 DIAGNOSIS — J4520 Mild intermittent asthma, uncomplicated: Secondary | ICD-10-CM

## 2014-12-22 DIAGNOSIS — K219 Gastro-esophageal reflux disease without esophagitis: Secondary | ICD-10-CM | POA: Diagnosis not present

## 2014-12-22 LAB — POCT GLYCOSYLATED HEMOGLOBIN (HGB A1C): Hemoglobin A1C: 6.1

## 2014-12-22 MED ORDER — METFORMIN HCL 500 MG PO TABS: 500.0000 mg | ORAL_TABLET | Freq: Two times a day (BID) | ORAL | Status: DC

## 2014-12-22 MED ORDER — OMEPRAZOLE 20 MG PO CPDR: 20.0000 mg | DELAYED_RELEASE_CAPSULE | Freq: Every day | ORAL | Status: DC | PRN

## 2014-12-22 MED ORDER — HYDROCHLOROTHIAZIDE 12.5 MG PO CAPS: 12.5000 mg | ORAL_CAPSULE | Freq: Every day | ORAL | Status: DC

## 2014-12-22 NOTE — Progress Notes (Signed)
Subjective:    Patient ID: Benjamin Hale, male    DOB: Dec 08, 1965, 49 y.o.   MRN: [id number]  Patient here today for follow up of chronic medical problems. Only complaint today is a slight burning sensation side of left foot.  Diabetes He presents for his follow-up diabetic visit. He has type 2 diabetes mellitus. No MedicAlert identification noted. The initial diagnosis of diabetes was made 4 months ago. His disease course has been improving. There are no hypoglycemic associated symptoms. Pertinent negatives for diabetes include no chest pain, no foot paresthesias, no polydipsia, no polyphagia, no polyuria and no weight loss. There are no hypoglycemic complications. Symptoms are worsening. There are no diabetic complications. Risk factors for coronary artery disease include dyslipidemia, diabetes mellitus, family history, hypertension and male sex. Current diabetic treatment includes oral agent (monotherapy). He is compliant with treatment most of the time. His weight is stable. He is following a diabetic diet. When asked about meal planning, he reported none. He has not had a previous visit with a dietitian. He rarely participates in exercise. His home blood glucose trend is fluctuating minimally. His breakfast blood glucose is taken between 9-10 am. His breakfast blood glucose range is generally 130-140 mg/dl. His overall blood glucose range is 130-140 mg/dl. An ACE inhibitor/angiotensin II receptor blocker is being taken. He does not see a podiatrist.Eye exam is not current.  Hypertension This is a chronic problem. The current episode started more than 1 year ago. The problem has been gradually worsening since onset. The problem is uncontrolled. Pertinent negatives include no chest pain or palpitations. Risk factors for coronary artery disease include dyslipidemia, family history and male gender. Past treatments include ACE inhibitors. The current treatment provides mild improvement. There is no  history of a thyroid problem.  Hyperlipidemia This is a chronic problem. The current episode started more than 1 year ago. The problem is controlled. Recent lipid tests were reviewed and are normal. Pertinent negatives include no chest pain. Treatments tried: Was put on lipitor at last visit but forgot all about it so he has not been taking. The current treatment provides moderate improvement of lipids. There are no compliance problems.  Risk factors for coronary artery disease include hypertension, dyslipidemia and male sex.  Atrial flutter Started after having Cervical spine surgery. Was cardioverted and is now on cardizem. No symptoms- no compliant of palpitations. Pt had a atrial ablation two months ago. No problems at this time.  GERD Omeprazole  Working well for symptoms. Asthma Albuterol HFA- He has not needed to use in over a year.    Review of Systems  Constitutional: Negative for weight loss.  Cardiovascular: Negative for chest pain and palpitations.  Endocrine: Negative for polydipsia, polyphagia and polyuria.  All other systems reviewed and are negative.      Objective:   Physical Exam  Constitutional: He is oriented to person, place, and time. He appears well-developed and well-nourished.  HENT:  Head: Normocephalic.  Right Ear: Hearing, tympanic membrane, external ear and ear canal normal.  Left Ear: Hearing, tympanic membrane, external ear and ear canal normal.  Nose: Right sinus exhibits maxillary sinus tenderness. Right sinus exhibits no frontal sinus tenderness. Left sinus exhibits maxillary sinus tenderness. Left sinus exhibits no frontal sinus tenderness.  Mouth/Throat: Posterior oropharyngeal erythema present.  Eyes: EOM are normal. Pupils are equal, round, and reactive to light.  Neck: Normal range of motion. Neck supple. No thyromegaly present.  Cardiovascular: Normal rate, regular rhythm, normal heart sounds and  intact distal pulses.   No murmur  heard. Pulmonary/Chest: Effort normal and breath sounds normal. He has no wheezes. He has no rales.  Abdominal: Soft. Bowel sounds are normal.  Genitourinary: Prostate normal and penis normal.  Musculoskeletal: Normal range of motion.  Neurological: He is alert and oriented to person, place, and time.  Skin: Skin is warm and dry.  Psychiatric: He has a normal mood and affect. His behavior is normal. Judgment and thought content normal.   BP 104/71 mmHg  Pulse 82  Temp(Src) 96.8 F (36 C) (Oral)  Ht $R'5\' 10"'OM$  (1.778 m)  Wt 228 lb (103.42 kg)  BMI 32.71 kg/m2  Results for orders placed or performed in visit on 12/22/14  POCT glycosylated hemoglobin (Hb A1C)  Result Value Ref Range   Hemoglobin A1C 6.1        Assessment & Plan:  1. Type 2 diabetes mellitus, controlled Continue to watch carbs - POCT glycosylated hemoglobin (Hb A1C) - Microalbumin, urine  2. Essential hypertension Do  Not add salt to diet - CMP14+EGFR - lisinopril (PRINIVIL,ZESTRIL) 40 MG tablet; Take 1 tablet (40 mg total) by mouth daily.  Dispense: 90 tablet; Refill: 3 - hydrochlorothiazide (MICROZIDE) 12.5 MG capsule; Take 1 capsule (12.5 mg total) by mouth daily.  Dispense: 30 capsule; Refill: 5  3. Hyperlipidemia associated with type 2 diabetes mellitus Low fat diet - NMR, lipoprofile - atorvastatin (LIPITOR) 40 MG tablet; Take 1 tablet (40 mg total) by mouth daily.  Dispense: 90 tablet; Refill: 3  4. Gastroesophageal reflux disease without esophagitis Avoid spicy foods Do not eat 2 hours prior to bedtime - omeprazole (PRILOSEC) 20 MG capsule; Take 1 capsule (20 mg total) by mouth daily as needed (for acid reflux). Acid reflux.  Dispense: 30 capsule; Refill: 5 - esomeprazole (NEXIUM) 40 MG capsule; TAKE ONE (1) CAPSULE EACH DAY  Dispense: 30 capsule; Refill: 5    Labs pending Health maintenance reviewed Diet and exercise encouraged Continue all meds Follow up  In 3 months   [place],  [name]

## 2014-12-22 NOTE — Patient Instructions (Signed)
Exercise to Stay Healthy Exercise helps you become and stay healthy. EXERCISE IDEAS AND TIPS Choose exercises that:  You enjoy.  Fit into your day. You do not need to exercise really hard to be healthy. You can do exercises at a slow or medium level and stay healthy. You can:  Stretch before and after working out.  Try yoga, Pilates, or tai chi.  Lift weights.  Walk fast, swim, jog, run, climb stairs, bicycle, dance, or rollerskate.  Take aerobic classes. Exercises that burn about 150 calories:  Running 1  miles in 15 minutes.  Playing volleyball for 45 to 60 minutes.  Washing and waxing a car for 45 to 60 minutes.  Playing touch football for 45 minutes.  Walking 1  miles in 35 minutes.  Pushing a stroller 1  miles in 30 minutes.  Playing basketball for 30 minutes.  Raking leaves for 30 minutes.  Bicycling 5 miles in 30 minutes.  Walking 2 miles in 30 minutes.  Dancing for 30 minutes.  Shoveling snow for 15 minutes.  Swimming laps for 20 minutes.  Walking up stairs for 15 minutes.  Bicycling 4 miles in 15 minutes.  Gardening for 30 to 45 minutes.  Jumping rope for 15 minutes.  Washing windows or floors for 45 to 60 minutes. Document Released: 07/05/2010 Document Revised: 08/25/2011 Document Reviewed: 07/05/2010 [employer] Patient Information 2015 [employer]. This information is not intended to replace advice given to you by your health care provider. Make sure you discuss any questions you have with your health care provider.  

## 2014-12-23 LAB — CMP14+EGFR
ALT: 42 IU/L (ref 0–44)
AST: 27 IU/L (ref 0–40)
Albumin/Globulin Ratio: 1.6 (ref 1.1–2.5)
Albumin: 4.4 g/dL (ref 3.5–5.5)
Alkaline Phosphatase: 61 IU/L (ref 39–117)
BUN/Creatinine Ratio: 21 — ABNORMAL HIGH (ref 9–20)
BUN: 22 mg/dL (ref 6–24)
Bilirubin Total: 0.4 mg/dL (ref 0.0–1.2)
CO2: 24 mmol/L (ref 18–29)
Calcium: 9.3 mg/dL (ref 8.7–10.2)
Chloride: 102 mmol/L (ref 97–108)
Creatinine, Ser: 1.05 mg/dL (ref 0.76–1.27)
GFR calc Af Amer: 97 mL/min/1.73
GFR calc non Af Amer: 84 mL/min/1.73
Globulin, Total: 2.8 g/dL (ref 1.5–4.5)
Glucose: 151 mg/dL — ABNORMAL HIGH (ref 65–99)
Potassium: 4.7 mmol/L (ref 3.5–5.2)
Sodium: 142 mmol/L (ref 134–144)
Total Protein: 7.2 g/dL (ref 6.0–8.5)

## 2014-12-23 LAB — NMR, LIPOPROFILE
Cholesterol: 114 mg/dL (ref 100–199)
HDL Cholesterol by NMR: 16 mg/dL — ABNORMAL LOW (ref >39)
HDL PARTICLE NUMBER: 20.8 umol/L — AB (ref >=30.5)
LDL PARTICLE NUMBER: 735 nmol/L (ref <1000)
LDL Size: 19.5 nm (ref >20.5)
LDL-C: 19 mg/dL (ref 0–99)
LP-IR SCORE: 93 — AB (ref <=45)
Small LDL Particle Number: 640 nmol/L — ABNORMAL HIGH (ref <=527)
Triglycerides by NMR: 397 mg/dL — ABNORMAL HIGH (ref 0–149)

## 2014-12-26 ENCOUNTER — Ambulatory Visit: Payer: 59 | Admitting: Nurse Practitioner

## 2015-02-07 ENCOUNTER — Encounter: Payer: Self-pay | Admitting: Family Medicine

## 2015-02-07 ENCOUNTER — Ambulatory Visit (INDEPENDENT_AMBULATORY_CARE_PROVIDER_SITE_OTHER): Payer: 59 | Admitting: Family Medicine

## 2015-02-07 VITALS — BP 112/70 | HR 61 | Temp 98.1°F | Ht 70.0 in | Wt 231.0 lb

## 2015-02-07 DIAGNOSIS — R0981 Nasal congestion: Secondary | ICD-10-CM | POA: Insufficient documentation

## 2015-02-07 NOTE — Progress Notes (Signed)
BP 112/70 mmHg  Pulse 61  Temp(Src) 98.1 F (36.7 C) (Oral)  Ht $R'5\' 10"'eZ$  (1.778 m)  Wt 231 lb (104.781 kg)  BMI 33.15 kg/m2   Subjective:    Patient ID: Benjamin Hale, male    DOB: 06-06-1966, 49 y.o.   MRN: [id number]  HPI: Benjamin Hale is a 49 y.o. male presenting on 02/07/2015 for Ear Pain   HPI Sinus congestion Patient presents today with worsening sinus congestion and pressure going up into his ears over the past week. He has had similar episodes before but this is the worst that he's had it in a long time. He has Flonase and has used it previously but did not start at this time yet. He also has Claritin but has not used it yet this time. He denies any fevers or chills. He denies any ear drainage but does have itching in his ears. He does have nasal congestion.  Relevant past medical, surgical, family and social history reviewed and updated as indicated. Interim medical history since our last visit reviewed. Allergies and medications reviewed and updated.  Review of Systems  Constitutional: Negative for fever and chills.  HENT: Positive for congestion, ear pain, postnasal drip, rhinorrhea, sinus pressure and sore throat. Negative for ear discharge and voice change.   Eyes: Positive for itching. Negative for pain, discharge, redness and visual disturbance.  Respiratory: Negative for chest tightness, shortness of breath and wheezing.   Cardiovascular: Negative for chest pain and leg swelling.  Gastrointestinal: Negative for abdominal pain, diarrhea and constipation.  Genitourinary: Negative for difficulty urinating.  Musculoskeletal: Negative for back pain and gait problem.  Skin: Negative for rash.  Neurological: Negative for syncope, light-headedness and headaches.  All other systems reviewed and are negative.   Per HPI unless specifically indicated above     Medication List       This list is accurate as of: 02/07/15  5:09 PM.  Always use your most recent med  list.               albuterol 108 (90 BASE) MCG/ACT inhaler  Commonly known as:  PROVENTIL HFA;VENTOLIN HFA  Inhale 2 puffs into the lungs every 6 (six) hours as needed for wheezing.     aspirin 81 MG tablet  Take 81 mg by mouth daily.     esomeprazole 40 MG capsule  Commonly known as:  NEXIUM  TAKE ONE (1) CAPSULE EACH DAY     fluticasone 50 MCG/ACT nasal spray  Commonly known as:  FLONASE  Place 2 sprays into the nose daily as needed for rhinitis or allergies.     glucose blood test strip  Commonly known as:  ONETOUCH VERIO  Use to check BG up to once daily.  DX: type 2 DM E11.9     hydrochlorothiazide 12.5 MG capsule  Commonly known as:  MICROZIDE  Take 1 capsule (12.5 mg total) by mouth daily.     lisinopril 40 MG tablet  Commonly known as:  PRINIVIL,ZESTRIL  Take 1 tablet (40 mg total) by mouth daily.     metFORMIN 500 MG tablet  Commonly known as:  GLUCOPHAGE  Take 1 tablet (500 mg total) by mouth 2 (two) times daily with a meal.     omeprazole 20 MG capsule  Commonly known as:  PRILOSEC  Take 1 capsule (20 mg total) by mouth daily as needed (for acid reflux). Acid reflux.     ONETOUCH DELICA LANCETS 17E Misc  Use  to check BG up to once daily           Objective:    BP 112/70 mmHg  Pulse 61  Temp(Src) 98.1 F (36.7 C) (Oral)  Ht $R'5\' 10"'NR$  (1.778 m)  Wt 231 lb (104.781 kg)  BMI 33.15 kg/m2  Wt Readings from Last 3 Encounters:  02/07/15 231 lb (104.781 kg)  12/22/14 228 lb (103.42 kg)  08/10/14 227 lb 3.2 oz (103.057 kg)    Physical Exam  Constitutional: He is oriented to person, place, and time. He appears well-developed and well-nourished. No distress.  HENT:  Right Ear: External ear and ear canal normal. No tenderness. Tympanic membrane is bulging. Tympanic membrane is not injected and not erythematous. No middle ear effusion.  Left Ear: External ear and ear canal normal. No tenderness. Tympanic membrane is bulging. Tympanic membrane is not  injected and not erythematous.  No middle ear effusion.  Nose: Mucosal edema present. No rhinorrhea. Right sinus exhibits no maxillary sinus tenderness and no frontal sinus tenderness. Left sinus exhibits no maxillary sinus tenderness and no frontal sinus tenderness.  Mouth/Throat: Uvula is midline and mucous membranes are normal. Posterior oropharyngeal edema and posterior oropharyngeal erythema present. No oropharyngeal exudate or tonsillar abscesses.  Eyes: Conjunctivae and EOM are normal. Pupils are equal, round, and reactive to light. Right eye exhibits no discharge. No scleral icterus.  Cardiovascular: Normal rate, regular rhythm, normal heart sounds and intact distal pulses.   No murmur heard. Pulmonary/Chest: Effort normal and breath sounds normal. No respiratory distress. He has no wheezes.  Abdominal: He exhibits no distension.  Musculoskeletal: Normal range of motion. He exhibits no edema.  Neurological: He is alert and oriented to person, place, and time. Coordination normal.  Skin: Skin is warm and dry. No rash noted. He is not diaphoretic.  Psychiatric: He has a normal mood and affect. His behavior is normal.  Vitals reviewed.   Results for orders placed or performed in visit on 12/22/14  CMP14+EGFR  Result Value Ref Range   Glucose 151 (H) 65 - 99 mg/dL   BUN 22 6 - 24 mg/dL   Creatinine, Ser 1.05 0.76 - 1.27 mg/dL   GFR calc non Af Amer 84 >59 mL/min/1.73   GFR calc Af Amer 97 >59 mL/min/1.73   BUN/Creatinine Ratio 21 (H) 9 - 20   Sodium 142 134 - 144 mmol/L   Potassium 4.7 3.5 - 5.2 mmol/L   Chloride 102 97 - 108 mmol/L   CO2 24 18 - 29 mmol/L   Calcium 9.3 8.7 - 10.2 mg/dL   Total Protein 7.2 6.0 - 8.5 g/dL   Albumin 4.4 3.5 - 5.5 g/dL   Globulin, Total 2.8 1.5 - 4.5 g/dL   Albumin/Globulin Ratio 1.6 1.1 - 2.5   Bilirubin Total 0.4 0.0 - 1.2 mg/dL   Alkaline Phosphatase 61 39 - 117 IU/L   AST 27 0 - 40 IU/L   ALT 42 0 - 44 IU/L  NMR, lipoprofile  Result Value  Ref Range   LDL Particle Number 735 <1000 nmol/L   LDL-C 19 0 - 99 mg/dL   HDL Cholesterol by NMR 16 (L) >39 mg/dL   Triglycerides by NMR 397 (H) 0 - 149 mg/dL   Cholesterol 114 100 - 199 mg/dL   HDL Particle Number 20.8 (L) >=30.5 umol/L   Small LDL Particle Number 640 (H) <=527 nmol/L   LDL Size 19.5 >20.5 nm   LP-IR Score 93 (H) <=45  POCT glycosylated hemoglobin (Hb  A1C)  Result Value Ref Range   Hemoglobin A1C 6.1       Assessment & Plan:       Problem List Items Addressed This Visit      Respiratory   Sinus congestion - Primary    Patient has sinus congestion. Recommended to use Flonase twice a day and an antihistamine. To do a solid week of this. He should get better in 3-4 days. If not improved in that time. Call me and I will send azithromycin.          Follow up plan: Return if symptoms worsen or fail to improve.  Caryl Pina, MD [facility] 02/07/2015, 5:09 PM

## 2015-02-07 NOTE — Assessment & Plan Note (Signed)
Patient has sinus congestion. Recommended to use Flonase twice a day and an antihistamine. To do a solid week of this. He should get better in 3-4 days. If not improved in that time. Call me and I will send azithromycin.

## 2015-02-07 NOTE — Patient Instructions (Signed)

## 2015-04-06 ENCOUNTER — Encounter: Payer: Self-pay | Admitting: Nurse Practitioner

## 2015-04-06 ENCOUNTER — Ambulatory Visit (INDEPENDENT_AMBULATORY_CARE_PROVIDER_SITE_OTHER): Payer: 59 | Admitting: Nurse Practitioner

## 2015-04-06 VITALS — BP 132/82 | HR 53 | Temp 97.4°F | Ht 70.0 in | Wt 234.0 lb

## 2015-04-06 DIAGNOSIS — J4520 Mild intermittent asthma, uncomplicated: Secondary | ICD-10-CM | POA: Diagnosis not present

## 2015-04-06 DIAGNOSIS — E785 Hyperlipidemia, unspecified: Secondary | ICD-10-CM

## 2015-04-06 DIAGNOSIS — K219 Gastro-esophageal reflux disease without esophagitis: Secondary | ICD-10-CM | POA: Diagnosis not present

## 2015-04-06 DIAGNOSIS — I10 Essential (primary) hypertension: Secondary | ICD-10-CM | POA: Diagnosis not present

## 2015-04-06 DIAGNOSIS — E119 Type 2 diabetes mellitus without complications: Secondary | ICD-10-CM | POA: Diagnosis not present

## 2015-04-06 DIAGNOSIS — E1169 Type 2 diabetes mellitus with other specified complication: Secondary | ICD-10-CM

## 2015-04-06 DIAGNOSIS — I484 Atypical atrial flutter: Secondary | ICD-10-CM | POA: Diagnosis not present

## 2015-04-06 LAB — POCT GLYCOSYLATED HEMOGLOBIN (HGB A1C): HEMOGLOBIN A1C: 5.8

## 2015-04-06 NOTE — Patient Instructions (Signed)
Diabetes and Foot Care Diabetes may cause you to have problems because of poor blood supply (circulation) to your feet and legs. This may cause the skin on your feet to become thinner, break easier, and heal more slowly. Your skin may become dry, and the skin may peel and crack. You may also have nerve damage in your legs and feet causing decreased feeling in them. You may not notice minor injuries to your feet that could lead to infections or more serious problems. Taking care of your feet is one of the most important things you can do for yourself.  HOME CARE INSTRUCTIONS  Wear shoes at all times, even in the house. Do not go barefoot. Bare feet are easily injured.  Check your feet daily for blisters, cuts, and redness. If you cannot see the bottom of your feet, use a mirror or ask someone for help.  Wash your feet with warm water (do not use hot water) and mild soap. Then pat your feet and the areas between your toes until they are completely dry. Do not soak your feet as this can dry your skin.  Apply a moisturizing lotion or petroleum jelly (that does not contain alcohol and is unscented) to the skin on your feet and to dry, brittle toenails. Do not apply lotion between your toes.  Trim your toenails straight across. Do not dig under them or around the cuticle. File the edges of your nails with an emery board or nail file.  Do not cut corns or calluses or try to remove them with medicine.  Wear clean socks or stockings every day. Make sure they are not too tight. Do not wear knee-high stockings since they may decrease blood flow to your legs.  Wear shoes that fit properly and have enough cushioning. To break in new shoes, wear them for just a few hours a day. This prevents you from injuring your feet. Always look in your shoes before you put them on to be sure there are no objects inside.  Do not cross your legs. This may decrease the blood flow to your feet.  If you find a minor scrape,  cut, or break in the skin on your feet, keep it and the skin around it clean and dry. These areas may be cleansed with mild soap and water. Do not cleanse the area with peroxide, alcohol, or iodine.  When you remove an adhesive bandage, be sure not to damage the skin around it.  If you have a wound, look at it several times a day to make sure it is healing.  Do not use heating pads or hot water bottles. They may burn your skin. If you have lost feeling in your feet or legs, you may not know it is happening until it is too late.  Make sure your health care provider performs a complete foot exam at least annually or more often if you have foot problems. Report any cuts, sores, or bruises to your health care provider immediately. SEEK MEDICAL CARE IF:   You have an injury that is not healing.  You have cuts or breaks in the skin.  You have an ingrown nail.  You notice redness on your legs or feet.  You feel burning or tingling in your legs or feet.  You have pain or cramps in your legs and feet.  Your legs or feet are numb.  Your feet always feel cold. SEEK IMMEDIATE MEDICAL CARE IF:   There is increasing redness,   swelling, or pain in or around a wound.  There is a red line that goes up your leg.  Pus is coming from a wound.  You develop a fever or as directed by your health care provider.  You notice a bad smell coming from an ulcer or wound.   This information is not intended to replace advice given to you by your health care provider. Make sure you discuss any questions you have with your health care provider.   Document Released: 05/30/2000 Document Revised: 02/02/2013 Document Reviewed: 11/09/2012 Elsevier Interactive Patient Education 2016 [employer]  

## 2015-04-06 NOTE — Progress Notes (Signed)
Subjective:    Patient ID: Benjamin Hale, male    DOB: [birth year], 49 y.o.   MRN: [id number]  Patient here today for follow up of chronic medical problems. Only complaint today is a slight burning sensation side of left foot.  Diabetes He presents for his follow-up diabetic visit. He has type 2 diabetes mellitus. No MedicAlert identification noted. The initial diagnosis of diabetes was made 4 months ago. His disease course has been improving. There are no hypoglycemic associated symptoms. Pertinent negatives for diabetes include no chest pain, no foot paresthesias, no polydipsia, no polyphagia, no polyuria and no weight loss. There are no hypoglycemic complications. Symptoms are worsening. There are no diabetic complications. Risk factors for coronary artery disease include dyslipidemia, diabetes mellitus, family history, hypertension and male sex. Current diabetic treatment includes oral agent (monotherapy). He is compliant with treatment most of the time. His weight is stable. He is following a diabetic diet. When asked about meal planning, he reported none. He has not had a previous visit with a dietitian. He rarely participates in exercise. His home blood glucose trend is fluctuating minimally. His breakfast blood glucose is taken between 9-10 am. His breakfast blood glucose range is generally 130-140 mg/dl. His overall blood glucose range is 130-140 mg/dl. An ACE inhibitor/angiotensin II receptor blocker is being taken. He does not see a podiatrist.Eye exam is not current.  Hypertension This is a chronic problem. The current episode started more than 1 year ago. The problem has been gradually worsening since onset. The problem is uncontrolled. Pertinent negatives include no chest pain or palpitations. Risk factors for coronary artery disease include dyslipidemia, family history and male gender. Past treatments include ACE inhibitors. The current treatment provides mild improvement. There is no  history of a thyroid problem.  Hyperlipidemia This is a chronic problem. The current episode started more than 1 year ago. The problem is controlled. Recent lipid tests were reviewed and are normal. Pertinent negatives include no chest pain. Treatments tried: Was put on lipitor at last visit but forgot all about it so he has not been taking. The current treatment provides moderate improvement of lipids. There are no compliance problems.  Risk factors for coronary artery disease include hypertension, dyslipidemia and male sex.  Atrial flutter Started after having Cervical spine surgery. Was cardioverted and is now on cardizem. No symptoms- no compliant of palpitations. Pt had a atrial ablation two months ago. No problems at this time.  GERD Omeprazole  Working well for symptoms. Asthma Albuterol HFA- He has not needed to use in over a year.    Review of Systems  Constitutional: Negative.  Negative for weight loss.  Cardiovascular: Negative for chest pain and palpitations.  Endocrine: Negative for polydipsia, polyphagia and polyuria.  Neurological: Negative.   Psychiatric/Behavioral: Negative.   All other systems reviewed and are negative.      Objective:   Physical Exam  Constitutional: He is oriented to person, place, and time. He appears well-developed and well-nourished.  HENT:  Head: Normocephalic.  Right Ear: Hearing, tympanic membrane, external ear and ear canal normal.  Left Ear: Hearing, tympanic membrane, external ear and ear canal normal.  Nose: Right sinus exhibits maxillary sinus tenderness. Right sinus exhibits no frontal sinus tenderness. Left sinus exhibits maxillary sinus tenderness. Left sinus exhibits no frontal sinus tenderness.  Mouth/Throat: Posterior oropharyngeal erythema present.  Eyes: EOM are normal. Pupils are equal, round, and reactive to light.  Neck: Normal range of motion. Neck supple. No thyromegaly present.  Cardiovascular: Normal rate, regular rhythm,  normal heart sounds and intact distal pulses.   No murmur heard. Pulmonary/Chest: Effort normal and breath sounds normal. He has no wheezes. He has no rales.  Abdominal: Soft. Bowel sounds are normal.  Genitourinary: Prostate normal and penis normal.  Musculoskeletal: Normal range of motion.  Neurological: He is alert and oriented to person, place, and time.  Skin: Skin is warm and dry.  Psychiatric: He has a normal mood and affect. His behavior is normal. Judgment and thought content normal.    BP 132/82 mmHg  Pulse 53  Temp(Src) 97.4 F (36.3 C) (Oral)  Ht 5' 10" (1.778 m)  Wt 234 lb (106.142 kg)  BMI 33.58 kg/m2  Results for orders placed or performed in visit on 04/06/15  POCT glycosylated hemoglobin (Hb A1C)  Result Value Ref Range   Hemoglobin A1C 5.8        Assessment & Plan:   1. Essential hypertension, benign Do not add salt to diet - CMP14+EGFR  2. Hyperlipidemia associated with type 2 diabetes mellitus (HCC) Low fat diet - Lipid panel  3. Controlled type 2 diabetes mellitus without complication, without long-term current use of insulin (HCC) Continue to watch carbs - POCT glycosylated hemoglobin (Hb A1C)   4. Gastroesophageal reflux disease without esophagitis Avoid spicy foods Do not eat 2 hours prior to bedtime   5. Atypical atrial flutter (HCC) Avoid caffeine  6. Asthma, mild intermittent, uncomplicated Avoid cigarette smoke Albuterol as needed    Labs pending Health maintenance reviewed Diet and exercise encouraged Continue all meds Follow up  In 3 month   [place], [name]

## 2015-04-07 LAB — CMP14+EGFR
ALBUMIN: 4.4 g/dL (ref 3.5–5.5)
ALT: 50 IU/L — AB (ref 0–44)
AST: 33 IU/L (ref 0–40)
Albumin/Globulin Ratio: 1.8 (ref 1.1–2.5)
Alkaline Phosphatase: 59 IU/L (ref 39–117)
BILIRUBIN TOTAL: 0.8 mg/dL (ref 0.0–1.2)
BUN/Creatinine Ratio: 22 — ABNORMAL HIGH (ref 9–20)
BUN: 20 mg/dL (ref 6–24)
CALCIUM: 9.1 mg/dL (ref 8.7–10.2)
CHLORIDE: 99 mmol/L (ref 97–106)
CO2: 23 mmol/L (ref 18–29)
CREATININE: 0.9 mg/dL (ref 0.76–1.27)
GFR calc non Af Amer: 100 mL/min/{1.73_m2} (ref >59)
GFR, EST AFRICAN AMERICAN: 116 mL/min/{1.73_m2} (ref >59)
GLUCOSE: 132 mg/dL — AB (ref 65–99)
Globulin, Total: 2.4 g/dL (ref 1.5–4.5)
Potassium: 4.2 mmol/L (ref 3.5–5.2)
Sodium: 138 mmol/L (ref 136–144)
Total Protein: 6.8 g/dL (ref 6.0–8.5)

## 2015-04-07 LAB — LIPID PANEL
Chol/HDL Ratio: 6.5 ratio units — ABNORMAL HIGH (ref 0.0–5.0)
Cholesterol, Total: 117 mg/dL (ref 100–199)
HDL: 18 mg/dL — AB (ref >39)
LDL CALC: 37 mg/dL (ref 0–99)
Triglycerides: 309 mg/dL — ABNORMAL HIGH (ref 0–149)
VLDL Cholesterol Cal: 62 mg/dL — ABNORMAL HIGH (ref 5–40)

## 2015-04-09 ENCOUNTER — Other Ambulatory Visit: Payer: Self-pay | Admitting: Nurse Practitioner

## 2015-04-09 MED ORDER — FENOFIBRATE 145 MG PO TABS: 145.0000 mg | ORAL_TABLET | Freq: Every day | ORAL | Status: DC

## 2015-05-02 ENCOUNTER — Ambulatory Visit (INDEPENDENT_AMBULATORY_CARE_PROVIDER_SITE_OTHER): Payer: 59

## 2015-05-02 DIAGNOSIS — Z23 Encounter for immunization: Secondary | ICD-10-CM | POA: Diagnosis not present

## 2015-07-20 ENCOUNTER — Encounter: Payer: Self-pay | Admitting: Nurse Practitioner

## 2015-07-20 ENCOUNTER — Ambulatory Visit (INDEPENDENT_AMBULATORY_CARE_PROVIDER_SITE_OTHER): Payer: Managed Care, Other (non HMO) | Admitting: Nurse Practitioner

## 2015-07-20 VITALS — BP 127/81 | HR 54 | Temp 97.0°F | Ht 70.0 in | Wt 229.0 lb

## 2015-07-20 DIAGNOSIS — E785 Hyperlipidemia, unspecified: Secondary | ICD-10-CM | POA: Diagnosis not present

## 2015-07-20 DIAGNOSIS — I10 Essential (primary) hypertension: Secondary | ICD-10-CM

## 2015-07-20 DIAGNOSIS — K219 Gastro-esophageal reflux disease without esophagitis: Secondary | ICD-10-CM | POA: Diagnosis not present

## 2015-07-20 DIAGNOSIS — E1169 Type 2 diabetes mellitus with other specified complication: Secondary | ICD-10-CM

## 2015-07-20 DIAGNOSIS — E119 Type 2 diabetes mellitus without complications: Secondary | ICD-10-CM | POA: Diagnosis not present

## 2015-07-20 LAB — POCT GLYCOSYLATED HEMOGLOBIN (HGB A1C): Hemoglobin A1C: 6

## 2015-07-20 MED ORDER — METFORMIN HCL 500 MG PO TABS: 500.0000 mg | ORAL_TABLET | Freq: Two times a day (BID) | ORAL | Status: DC

## 2015-07-20 MED ORDER — OMEPRAZOLE 20 MG PO CPDR: 20.0000 mg | DELAYED_RELEASE_CAPSULE | Freq: Every day | ORAL | Status: DC | PRN

## 2015-07-20 MED ORDER — LISINOPRIL 40 MG PO TABS: 40.0000 mg | ORAL_TABLET | Freq: Every day | ORAL | Status: DC

## 2015-07-20 MED ORDER — FENOFIBRATE 145 MG PO TABS: 145.0000 mg | ORAL_TABLET | Freq: Every day | ORAL | Status: DC

## 2015-07-20 MED ORDER — HYDROCHLOROTHIAZIDE 12.5 MG PO CAPS: 12.5000 mg | ORAL_CAPSULE | Freq: Every day | ORAL | Status: DC

## 2015-07-20 NOTE — Patient Instructions (Signed)

## 2015-07-20 NOTE — Progress Notes (Signed)
Subjective:    Patient ID: Benjamin Hale, male    DOB: 1966-04-21, 50 y.o.   MRN: [id number]  Patient here today for follow up of chronic medical problems.  Outpatient Encounter Prescriptions as of 07/20/2015  Medication Sig  . albuterol (PROVENTIL HFA;VENTOLIN HFA) 108 (90 BASE) MCG/ACT inhaler Inhale 2 puffs into the lungs every 6 (six) hours as needed for wheezing.  Marland Kitchen aspirin 81 MG tablet Take 81 mg by mouth daily.  . fenofibrate (TRICOR) 145 MG tablet Take 1 tablet (145 mg total) by mouth daily.  . fluticasone (FLONASE) 50 MCG/ACT nasal spray Place 2 sprays into the nose daily as needed for rhinitis or allergies.  Marland Kitchen glucose blood (ONETOUCH VERIO) test strip Use to check BG up to once daily.  DX: type 2 DM E11.9  . hydrochlorothiazide (MICROZIDE) 12.5 MG capsule Take 1 capsule (12.5 mg total) by mouth daily.  Marland Kitchen lisinopril (PRINIVIL,ZESTRIL) 40 MG tablet Take 1 tablet (40 mg total) by mouth daily.  . metFORMIN (GLUCOPHAGE) 500 MG tablet Take 1 tablet (500 mg total) by mouth 2 (two) times daily with a meal.  . omeprazole (PRILOSEC) 20 MG capsule Take 1 capsule (20 mg total) by mouth daily as needed (for acid reflux). Acid reflux.  Glory Rosebush DELICA LANCETS 80D MISC Use to check BG up to once daily   No facility-administered encounter medications on file as of 07/20/2015.      Diabetes He presents for his follow-up diabetic visit. He has type 2 diabetes mellitus. No MedicAlert identification noted. The initial diagnosis of diabetes was made 4 months ago. His disease course has been improving. There are no hypoglycemic associated symptoms. Pertinent negatives for diabetes include no chest pain, no foot paresthesias, no polydipsia, no polyphagia, no polyuria and no weight loss. There are no hypoglycemic complications. Symptoms are worsening. There are no diabetic complications. Risk factors for coronary artery disease include dyslipidemia, diabetes mellitus, family history, hypertension and  male sex. Current diabetic treatment includes oral agent (monotherapy). He is compliant with treatment most of the time. His weight is stable. He is following a diabetic diet. When asked about meal planning, he reported none. He has not had a previous visit with a dietitian. He rarely participates in exercise. His home blood glucose trend is fluctuating minimally. His breakfast blood glucose is taken between 9-10 am. His breakfast blood glucose range is generally 130-140 mg/dl. His overall blood glucose range is 130-140 mg/dl. An ACE inhibitor/angiotensin II receptor blocker is being taken. He does not see a podiatrist.Eye exam is not current.  Hypertension This is a chronic problem. The current episode started more than 1 year ago. The problem has been gradually worsening since onset. The problem is uncontrolled. Pertinent negatives include no chest pain or palpitations. Risk factors for coronary artery disease include dyslipidemia, family history and male gender. Past treatments include ACE inhibitors. The current treatment provides mild improvement. There is no history of a thyroid problem.  Hyperlipidemia This is a chronic problem. The current episode started more than 1 year ago. The problem is controlled. Recent lipid tests were reviewed and are normal. Pertinent negatives include no chest pain. Treatments tried: Was put on lipitor at last visit but forgot all about it so he has not been taking. The current treatment provides moderate improvement of lipids. There are no compliance problems.  Risk factors for coronary artery disease include hypertension, dyslipidemia and male sex.  Atrial flutter Started after having Cervical spine surgery. Was cardioverted and is  now on cardizem. No symptoms- no compliant of palpitations. Pt had a atrial ablation two months ago. No problems at this time.  GERD Omeprazole  Working well for symptoms. Asthma Albuterol HFA- He has not needed to use in over a  year.    Review of Systems  Constitutional: Negative.  Negative for weight loss.  Cardiovascular: Negative for chest pain and palpitations.  Endocrine: Negative for polydipsia, polyphagia and polyuria.  Neurological: Negative.   Psychiatric/Behavioral: Negative.   All other systems reviewed and are negative.      Objective:   Physical Exam  Constitutional: He is oriented to person, place, and time. He appears well-developed and well-nourished.  HENT:  Head: Normocephalic.  Right Ear: Hearing, tympanic membrane, external ear and ear canal normal.  Left Ear: Hearing, tympanic membrane, external ear and ear canal normal.  Nose: Right sinus exhibits maxillary sinus tenderness. Right sinus exhibits no frontal sinus tenderness. Left sinus exhibits maxillary sinus tenderness. Left sinus exhibits no frontal sinus tenderness.  Mouth/Throat: Posterior oropharyngeal erythema present.  Eyes: EOM are normal. Pupils are equal, round, and reactive to light.  Neck: Normal range of motion. Neck supple. No thyromegaly present.  Cardiovascular: Normal rate, regular rhythm, normal heart sounds and intact distal pulses.   No murmur heard. Pulmonary/Chest: Effort normal and breath sounds normal. He has no wheezes. He has no rales.  Abdominal: Soft. Bowel sounds are normal.  Genitourinary: Prostate normal and penis normal.  Musculoskeletal: Normal range of motion.  Neurological: He is alert and oriented to person, place, and time.  Skin: Skin is warm and dry.  Psychiatric: He has a normal mood and affect. His behavior is normal. Judgment and thought content normal.    BP 127/81 mmHg  Pulse 54  Temp(Src) 97 F (36.1 C) (Oral)  Ht '5\' 10"'$  (1.778 m)  Wt 229 lb (103.874 kg)  BMI 32.86 kg/m2   Results for orders placed or performed in visit on 07/20/15  POCT glycosylated hemoglobin (Hb A1C)  Result Value Ref Range   Hemoglobin A1C 6.0          Assessment & Plan:   1. Essential  hypertension, benign Do not add salt to diet - CMP14+EGFR- hydrochlorothiazide (MICROZIDE) 12.5 MG capsule; Take 1 capsule (12.5 mg total) by mouth daily.  Dispense: 30 capsule; Refill: 5 - lisinopril (PRINIVIL,ZESTRIL) 40 MG tablet; Take 1 tablet (40 m total) by mouth daily.  Dispense: 90 tablet; Refill: 3    2. Hyperlipidemia associated with type 2 diabetes mellitus (HCC) Low fat diet - Lipid panel - fenofibrate (TRICOR) 145 MG tablet; Take 1 tablet (145 mg total) by mouth daily.  Dispense: 30 tablet; Refill: 5  3. Controlled type 2 diabetes mellitus without complication, without long-term current use of insulin (HCC) Continue ti watch carbs in diet - POCT glycosylated hemoglobin (Hb A1C) - metFORMIN (GLUCOPHAGE) 500 MG tablet; Take 1 tablet (500 mg total) by mouth 2 (two) times daily with a meal.  Dispense: 180 tablet; Refill: 1   4. Gastroesophageal reflux disease without esophagitis Avoid spicy foods Do not eat 2 hours prior to bedtime - omeprazole (PRILOSEC) 20 MG capsule; Take 1 capsule (20 mg total) by mouth daily as needed (for acid reflux). Acid reflux.  Dispense: 30 capsule; Refill: [place], [name]

## 2015-07-21 LAB — LIPID PANEL
CHOLESTEROL TOTAL: 106 mg/dL (ref 100–199)
Chol/HDL Ratio: 6.6 ratio units — ABNORMAL HIGH (ref 0.0–5.0)
HDL: 16 mg/dL — ABNORMAL LOW (ref >39)
LDL CALC: 51 mg/dL (ref 0–99)
TRIGLYCERIDES: 193 mg/dL — AB (ref 0–149)
VLDL CHOLESTEROL CAL: 39 mg/dL (ref 5–40)

## 2015-07-21 LAB — CMP14+EGFR
ALBUMIN: 4.2 g/dL (ref 3.5–5.5)
ALK PHOS: 53 IU/L (ref 39–117)
ALT: 51 IU/L — AB (ref 0–44)
AST: 32 IU/L (ref 0–40)
Albumin/Globulin Ratio: 1.6 (ref 1.1–2.5)
BUN/Creatinine Ratio: 16 (ref 9–20)
BUN: 16 mg/dL (ref 6–24)
Bilirubin Total: 0.6 mg/dL (ref 0.0–1.2)
CO2: 25 mmol/L (ref 18–29)
CREATININE: 1.03 mg/dL (ref 0.76–1.27)
Calcium: 9 mg/dL (ref 8.7–10.2)
Chloride: 100 mmol/L (ref 96–106)
GFR calc Af Amer: 98 mL/min/{1.73_m2} (ref >59)
GFR calc non Af Amer: 85 mL/min/{1.73_m2} (ref >59)
GLUCOSE: 129 mg/dL — AB (ref 65–99)
Globulin, Total: 2.7 g/dL (ref 1.5–4.5)
Potassium: 3.8 mmol/L (ref 3.5–5.2)
Sodium: 141 mmol/L (ref 134–144)
Total Protein: 6.9 g/dL (ref 6.0–8.5)

## 2015-08-09 LAB — HM DIABETES EYE EXAM

## 2015-09-14 NOTE — Progress Notes (Signed)
HPI: FU atrial flutter. He developed AFlutter with RVR after cervical decompression and discectomy for cervical spondylosis in April 2014. He was rate controlled with IV diltiazem. He did not convert on his own and underwent DCCV within 24 hours of onset. Echo 09/2012: Mild LVH, EF 60-65%, Gr 2 DD. Patient subsequently underwent atrial flutter ablation by Dr. Johney FrameAllred. Patient has not been seen since 2014.  Current Outpatient Prescriptions  Medication Sig Dispense Refill  . albuterol (PROVENTIL HFA;VENTOLIN HFA) 108 (90 BASE) MCG/ACT inhaler Inhale 2 puffs into the lungs every 6 (six) hours as needed for wheezing.    Marland Kitchen. aspirin 81 MG tablet Take 81 mg by mouth daily.    . fenofibrate (TRICOR) 145 MG tablet Take 1 tablet (145 mg total) by mouth daily. 30 tablet 5  . fluticasone (FLONASE) 50 MCG/ACT nasal spray Place 2 sprays into the nose daily as needed for rhinitis or allergies.    Marland Kitchen. glucose blood (ONETOUCH VERIO) test strip Use to check BG up to once daily.  DX: type 2 DM E11.9 100 each 2  . hydrochlorothiazide (MICROZIDE) 12.5 MG capsule Take 1 capsule (12.5 mg total) by mouth daily. 30 capsule 5  . lisinopril (PRINIVIL,ZESTRIL) 40 MG tablet Take 1 tablet (40 mg total) by mouth daily. 90 tablet 3  . metFORMIN (GLUCOPHAGE) 500 MG tablet Take 1 tablet (500 mg total) by mouth 2 (two) times daily with a meal. 180 tablet 1  . omeprazole (PRILOSEC) 20 MG capsule Take 1 capsule (20 mg total) by mouth daily as needed (for acid reflux). Acid reflux. 30 capsule 5  . ONETOUCH DELICA LANCETS 33G MISC Use to check BG up to once daily 100 each 2   No current facility-administered medications for this visit.     Past Medical History  Diagnosis Date  . GERD (gastroesophageal reflux disease)   . Cervical disc disease     s/p cervical spine surgery 4/14  . HTN (hypertension)   . Hyperlipidemia   . Hypogonadism male   . Atrial flutter (HCC)     a. s/p cervical spine surgery 4/14 => s/p DCCV;  b.   Echo 09/2012:  Mild LVH, EF 60-65%, Gr 2 DD.c. s/p atrial flutter ablation 12/23/2012 by Dr Johney FrameAllred  . Anginal pain (HCC)   . Asthma   . COPD (chronic obstructive pulmonary disease) (HCC)   . Exertional shortness of breath   . Migraines     "once q 3-4 months" (12/23/2012)  . Headache     "weekly" (12/23/2012)  . Arthritis     "neck" (12/23/2012)  . Diabetes mellitus without complication [facility](HCC)     Past Surgical History  Procedure Laterality Date  . Treatment fistula anal  ~ 2007  . Wisdom tooth extraction    . Anterior cervical decomp/discectomy fusion N/A 09/22/2012    Procedure: ANTERIOR CERVICAL DECOMPRESSION/DISCECTOMY FUSION 2 LEVELS;  Surgeon: Karn CassisErnesto M Botero, MD;  Location: [facility];  Service: Neurosurgery;  Laterality: N/A;  Cervical five-six Cervical six-seven Anterior cervical decompression/diskectomy/fusion  . Cardioversion N/A 09/23/2012    Procedure: CARDIOVERSION;  Surgeon: Gaylord Shihhomas C Wall, MD;  [facility];  Service: Cardiovascular;  Laterality: N/A;  . Atrial flutter ablation  12/23/2012    CTI ablation by Dr Johney FrameAllred  . Tonsillectomy  1973  . Atrial flutter ablation N/A 12/23/2012    Procedure: ATRIAL FLUTTER ABLATION;  Surgeon: Hillis RangeJames Allred, MD;  [facility];  Service: Cardiovascular;  Laterality: N/A;  Social History   Social History  . Marital Status: Married    Spouse Name: N/A  . Number of Children: N/A  . Years of Education: N/A   Occupational History  . Not on file.   Social History Main Topics  . Smoking status: Former Smoker -- 1.50 packs/day for 22 years    Types: Cigarettes    Quit date: 06/16/2005  . Smokeless tobacco: Never Used  . Alcohol Use: Yes     Comment: 12/23/2012 previously drank heavily - quit that 8 yrs ago; might have a beer q 5 yr"  . Drug Use: No  . Sexual Activity: Yes   Other Topics Concern  . Not on file   Social History Narrative   Lives in [place] with his wife and 79 yr old son.  Works as copy Optometrist.   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    Family History  Problem Relation Age of Onset  . Lung cancer Mother     died @ 59  . Brain cancer Father     died @ 58  . Diabetes Maternal Uncle   . Diabetes Maternal Grandfather     ROS: no fevers or chills, productive cough, hemoptysis, dysphasia, odynophagia, melena, hematochezia, dysuria, hematuria, rash, seizure activity, orthopnea, PND, pedal edema, claudication. Remaining systems are negative.  Physical Exam: Well-developed well-nourished in no acute distress.  Skin is warm and dry.  HEENT is normal.  Neck is supple.  Chest is clear to auscultation with normal expansion.  Cardiovascular exam is regular rate and rhythm.  Abdominal exam nontender or distended. No masses palpated. Extremities show no edema. neuro grossly intact  ECG     This encounter was created in error - please disregard.

## 2015-09-20 ENCOUNTER — Encounter: Payer: Self-pay | Admitting: Cardiology

## 2015-09-26 ENCOUNTER — Other Ambulatory Visit: Payer: Self-pay | Admitting: Nurse Practitioner

## 2015-10-17 NOTE — Progress Notes (Signed)
HPI: FU atrial flutter. He developed AFlutter with RVR after cervical decompression and discectomy for cervical spondylosis in April 2014. He was rate controlled with IV diltiazem. He did not convert on his own and underwent DCCV within 24 hours of onset. Echo 09/2012: Mild LVH, EF 60-65%, Gr 2 DD. TSH 0.510. Patient had recurrent atrial flutt and underwent ablation in July 2014.  Since last seen, He notes some dyspnea on exertion. Occasional orthopnea but no pedal edema. He has developed a chest discomfort that occurs only with stress. It is substernal and there is a cramping sensation in his back. No associated dyspnea or diaphoresis but occasional nausea. Note he does not have chest pain with exertion. His pain is not pleuritic or positional. No palpitations or syncope.  Current Outpatient Prescriptions  Medication Sig Dispense Refill  . albuterol (PROVENTIL HFA;VENTOLIN HFA) 108 (90 BASE) MCG/ACT inhaler Inhale 2 puffs into the lungs every 6 (six) hours as needed for wheezing.    Marland Kitchen. aspirin 81 MG tablet Take 81 mg by mouth daily.    . fenofibrate (TRICOR) 145 MG tablet Take 1 tablet (145 mg total) by mouth daily. 30 tablet 5  . fenofibrate 160 MG tablet TAKE ONE (1) TABLET EACH DAY 30 tablet 3  . fluticasone (FLONASE) 50 MCG/ACT nasal spray Place 2 sprays into the nose daily as needed for rhinitis or allergies.    Marland Kitchen. glucose blood (ONETOUCH VERIO) test strip Use to check BG up to once daily.  DX: type 2 DM E11.9 100 each 2  . hydrochlorothiazide (MICROZIDE) 12.5 MG capsule Take 1 capsule (12.5 mg total) by mouth daily. 30 capsule 5  . lisinopril (PRINIVIL,ZESTRIL) 40 MG tablet Take 1 tablet (40 mg total) by mouth daily. 90 tablet 3  . metFORMIN (GLUCOPHAGE) 500 MG tablet Take 1 tablet (500 mg total) by mouth 2 (two) times daily with a meal. 180 tablet 1  . omeprazole (PRILOSEC) 20 MG capsule Take 1 capsule (20 mg total) by mouth daily as needed (for acid reflux). Acid reflux. 30 capsule 5  .  ONETOUCH DELICA LANCETS 33G MISC Use to check BG up to once daily 100 each 2   No current facility-administered medications for this visit.     Past Medical History  Diagnosis Date  . GERD (gastroesophageal reflux disease)   . Cervical disc disease     s/p cervical spine surgery 4/14  . HTN (hypertension)   . Hyperlipidemia   . Hypogonadism male   . Atrial flutter (HCC)     a. s/p cervical spine surgery 4/14 => s/p DCCV;  b.  Echo 09/2012:  Mild LVH, EF 60-65%, Gr 2 DD.c. s/p atrial flutter ablation 12/23/2012 by Dr Johney FrameAllred  . Anginal pain (HCC)   . Asthma   . COPD (chronic obstructive pulmonary disease) (HCC)   . Exertional shortness of breath   . Migraines     "once q 3-4 months" (12/23/2012)  . Headache     "weekly" (12/23/2012)  . Arthritis     "neck" (12/23/2012)  . Diabetes mellitus without complication [facility](HCC)     Past Surgical History  Procedure Laterality Date  . Treatment fistula anal  ~ 2007  . Wisdom tooth extraction    . Anterior cervical decomp/discectomy fusion N/A 09/22/2012    Procedure: ANTERIOR CERVICAL DECOMPRESSION/DISCECTOMY FUSION 2 LEVELS;  Surgeon: Karn CassisErnesto M Botero, MD;  Location: [facility]S;  Service: Neurosurgery;  Laterality: N/A;  Cervical five-six Cervical six-seven Anterior cervical decompression/diskectomy/fusion  .  Cardioversion N/A 09/23/2012    Procedure: CARDIOVERSION;  Surgeon: Gaylord Shih, MD;  Location: [facility] OR;  Service: Cardiovascular;  Laterality: N/A;  . Atrial flutter ablation  12/23/2012    CTI ablation by Dr Johney Frame  . Tonsillectomy  1973  . Atrial flutter ablation N/A 12/23/2012    Procedure: ATRIAL FLUTTER ABLATION;  Surgeon: Hillis Range, MD;  Location: [facility] LAB;  Service: Cardiovascular;  Laterality: N/A;    Social History   Social History  . Marital Status: Married    Spouse Name: N/A  . Number of Children: N/A  . Years of Education: N/A   Occupational History  . Not on file.   Social History Main Topics  . Smoking  status: Former Smoker -- 1.50 packs/day for 22 years    Types: Cigarettes    Quit date: 06/16/2005  . Smokeless tobacco: Never Used  . Alcohol Use: Yes     Comment: 12/23/2012 previously drank heavily - quit that 8 yrs ago; might have a beer q 5 yr"  . Drug Use: No  . Sexual Activity: Yes   Other Topics Concern  . Not on file   Social History Narrative   Lives in [place] with his wife and 78 yr old son.  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    Family History  Problem Relation Age of Onset  . Lung cancer Mother     died @ 76  . Brain cancer Father     died @ 26  . Diabetes Maternal Uncle   . Diabetes Maternal Grandfather     ROS: no fevers or chills, productive cough, hemoptysis, dysphasia, odynophagia, melena, hematochezia, dysuria, hematuria, rash, seizure activity, orthopnea, PND, pedal edema, claudication. Remaining systems are negative.  Physical Exam: Well-developed well-nourished in no acute distress.  Skin is warm and dry.  HEENT is normal.  Neck is supple.  Chest is clear to auscultation with normal expansion.  Cardiovascular exam is regular rate and rhythm.  Abdominal exam nontender or distended. No masses palpated. Extremities show no edema. neuro grossly intact  ECG Sinus rhythm at a rate of 59. No ST changes.

## 2015-10-18 ENCOUNTER — Ambulatory Visit (INDEPENDENT_AMBULATORY_CARE_PROVIDER_SITE_OTHER): Payer: Managed Care, Other (non HMO) | Admitting: Nurse Practitioner

## 2015-10-18 ENCOUNTER — Encounter: Payer: Self-pay | Admitting: Nurse Practitioner

## 2015-10-18 VITALS — BP 124/78 | HR 61 | Temp 99.0°F | Ht 70.0 in | Wt 234.0 lb

## 2015-10-18 DIAGNOSIS — I484 Atypical atrial flutter: Secondary | ICD-10-CM

## 2015-10-18 DIAGNOSIS — E785 Hyperlipidemia, unspecified: Secondary | ICD-10-CM

## 2015-10-18 DIAGNOSIS — R079 Chest pain, unspecified: Secondary | ICD-10-CM | POA: Diagnosis not present

## 2015-10-18 DIAGNOSIS — J4520 Mild intermittent asthma, uncomplicated: Secondary | ICD-10-CM | POA: Diagnosis not present

## 2015-10-18 DIAGNOSIS — K219 Gastro-esophageal reflux disease without esophagitis: Secondary | ICD-10-CM | POA: Diagnosis not present

## 2015-10-18 DIAGNOSIS — I10 Essential (primary) hypertension: Secondary | ICD-10-CM

## 2015-10-18 DIAGNOSIS — E119 Type 2 diabetes mellitus without complications: Secondary | ICD-10-CM

## 2015-10-18 LAB — BAYER DCA HB A1C WAIVED: HB A1C: 6.1 % (ref <7.0)

## 2015-10-18 NOTE — Progress Notes (Signed)
Subjective:    Patient ID: Benjamin Hale, male    DOB: 1965-07-29, 50 y.o.   MRN: [id number]  Patient here today for follow up of chronic medical problems.  Outpatient Encounter Prescriptions as of 10/18/2015  Medication Sig  . albuterol (PROVENTIL HFA;VENTOLIN HFA) 108 (90 BASE) MCG/ACT inhaler Inhale 2 puffs into the lungs every 6 (six) hours as needed for wheezing.  Marland Kitchen aspirin 81 MG tablet Take 81 mg by mouth daily.  . fenofibrate (TRICOR) 145 MG tablet Take 1 tablet (145 mg total) by mouth daily.  . fenofibrate 160 MG tablet TAKE ONE (1) TABLET EACH DAY  . fluticasone (FLONASE) 50 MCG/ACT nasal spray Place 2 sprays into the nose daily as needed for rhinitis or allergies.  Marland Kitchen glucose blood (ONETOUCH VERIO) test strip Use to check BG up to once daily.  DX: type 2 DM E11.9  . hydrochlorothiazide (MICROZIDE) 12.5 MG capsule Take 1 capsule (12.5 mg total) by mouth daily.  Marland Kitchen lisinopril (PRINIVIL,ZESTRIL) 40 MG tablet Take 1 tablet (40 mg total) by mouth daily.  . metFORMIN (GLUCOPHAGE) 500 MG tablet Take 1 tablet (500 mg total) by mouth 2 (two) times daily with a meal.  . omeprazole (PRILOSEC) 20 MG capsule Take 1 capsule (20 mg total) by mouth daily as needed (for acid reflux). Acid reflux.  Letta Pate DELICA LANCETS 33G MISC Use to check BG up to once daily   No facility-administered encounter medications on file as of 10/18/2015.   * he says that he has been having "Chest Discomfort" says that occurs most often when he is stressed or blood pressure is up. He describes it as a soreness and never occurs with exertion.   Diabetes He presents for his follow-up diabetic visit. He has type 2 diabetes mellitus. No MedicAlert identification noted. The initial diagnosis of diabetes was made 4 months ago. His disease course has been improving. There are no hypoglycemic associated symptoms. Pertinent negatives for diabetes include no chest pain, no foot paresthesias, no polydipsia, no polyphagia, no  polyuria and no weight loss. There are no hypoglycemic complications. Symptoms are worsening. There are no diabetic complications. Risk factors for coronary artery disease include dyslipidemia, diabetes mellitus, family history, hypertension and male sex. Current diabetic treatment includes oral agent (monotherapy). He is compliant with treatment most of the time. His weight is stable. He is following a diabetic diet. When asked about meal planning, he reported none. He has not had a previous visit with a dietitian. He rarely participates in exercise. His home blood glucose trend is fluctuating minimally. His breakfast blood glucose is taken between 9-10 am. His breakfast blood glucose range is generally 130-140 mg/dl. His overall blood glucose range is 130-140 mg/dl. An ACE inhibitor/angiotensin II receptor blocker is being taken. He does not see a podiatrist.Eye exam is not current.  Hypertension This is a chronic problem. The current episode started more than 1 year ago. The problem has been gradually worsening since onset. The problem is uncontrolled. Pertinent negatives include no chest pain or palpitations. Risk factors for coronary artery disease include dyslipidemia, family history and male gender. Past treatments include ACE inhibitors. The current treatment provides mild improvement. There is no history of a thyroid problem.  Hyperlipidemia This is a chronic problem. The current episode started more than 1 year ago. The problem is controlled. Recent lipid tests were reviewed and are normal. Pertinent negatives include no chest pain. Treatments tried: Was put on lipitor at last visit but forgot all about  it so he has not been taking. The current treatment provides moderate improvement of lipids. There are no compliance problems.  Risk factors for coronary artery disease include hypertension, dyslipidemia and male sex.  Atrial flutter Started after having Cervical spine surgery. Was cardioverted and is  now on cardizem. No symptoms- no compliant of palpitations. Pt had a atrial ablation two months ago. No problems at this time. GERD Omeprazole  Working well for symptoms. Asthma Albuterol HFA- He has not needed to use in over a year. insomnia Sleeps only a couple of hours a night- use to be on [place] but did not like the way it made him feel.  Review of Systems  Constitutional: Negative.  Negative for weight loss.  Cardiovascular: Negative for chest pain and palpitations.  Endocrine: Negative for polydipsia, polyphagia and polyuria.  Neurological: Negative.   Psychiatric/Behavioral: Negative.   All other systems reviewed and are negative.      Objective:   Physical Exam  Constitutional: He is oriented to person, place, and time. He appears well-developed and well-nourished.  HENT:  Head: Normocephalic.  Right Ear: Hearing, tympanic membrane, external ear and ear canal normal.  Left Ear: Hearing, tympanic membrane, external ear and ear canal normal.  Nose: Right sinus exhibits maxillary sinus tenderness. Right sinus exhibits no frontal sinus tenderness. Left sinus exhibits maxillary sinus tenderness. Left sinus exhibits no frontal sinus tenderness.  Mouth/Throat: Posterior oropharyngeal erythema present.  Eyes: EOM are normal. Pupils are equal, round, and reactive to light.  Neck: Normal range of motion. Neck supple. No thyromegaly present.  Cardiovascular: Normal rate, regular rhythm, normal heart sounds and intact distal pulses.   No murmur heard. Pulmonary/Chest: Effort normal and breath sounds normal. He has no wheezes. He has no rales.  Abdominal: Soft. Bowel sounds are normal.  Genitourinary: Prostate normal and penis normal.  Musculoskeletal: Normal range of motion.  Neurological: He is alert and oriented to person, place, and time.  Skin: Skin is warm and dry.  Psychiatric: He has a normal mood and affect. His behavior is normal. Judgment and thought content normal.     BP 124/78 mmHg  Pulse 61  Temp(Src) 99 F (37.2 C) (Oral)  Ht '5\' 10"'$  (1.778 m)  Wt 234 lb (106.142 kg)  BMI 33.58 kg/m2   HGBA1c 6.1%  EKG- Kerry Hough, [name]     Assessment & Plan:   1. Hyperlipidemia with target LDL less than 100 Low fat diet - CMP14+EGFR - Lipid panel  2. Controlled type 2 diabetes mellitus without complication, unspecified long term insulin use status (HCC) contiue to watch carbs in diet - Bayer DCA Hb A1c Waived - CMP14+EGFR  3. Essential hypertension Do not add salt to diet - CMP14+EGFR  4. Gastroesophageal reflux disease without esophagitis Avoid spicy foods Do not eat 2 hours prior to bedtime  5. Atypical atrial flutter (HCC) Need to follow up with cardiologist  6. Asthma, mild intermittent, uncomplicated Avoid cigarette smoke  7. Chest pain, unspecified chest pain type Will follow up with cardiology - EKG 12-Lead    Labs pending Health maintenance reviewed Diet and exercise encouraged Continue all meds Follow up  In 3 months   [place], [name]

## 2015-10-18 NOTE — Patient Instructions (Signed)

## 2015-10-19 LAB — LIPID PANEL
CHOLESTEROL TOTAL: 119 mg/dL (ref 100–199)
Chol/HDL Ratio: 6.6 ratio units — ABNORMAL HIGH (ref 0.0–5.0)
HDL: 18 mg/dL — ABNORMAL LOW (ref >39)
LDL CALC: 55 mg/dL (ref 0–99)
Triglycerides: 232 mg/dL — ABNORMAL HIGH (ref 0–149)
VLDL CHOLESTEROL CAL: 46 mg/dL — AB (ref 5–40)

## 2015-10-19 LAB — CMP14+EGFR
A/G RATIO: 1.7 (ref 1.2–2.2)
ALBUMIN: 4.5 g/dL (ref 3.5–5.5)
ALT: 50 IU/L — ABNORMAL HIGH (ref 0–44)
AST: 39 IU/L (ref 0–40)
Alkaline Phosphatase: 51 IU/L (ref 39–117)
BILIRUBIN TOTAL: 0.7 mg/dL (ref 0.0–1.2)
BUN/Creatinine Ratio: 26 — ABNORMAL HIGH (ref 9–20)
BUN: 27 mg/dL — AB (ref 6–24)
CALCIUM: 9.3 mg/dL (ref 8.7–10.2)
CO2: 24 mmol/L (ref 18–29)
CREATININE: 1.03 mg/dL (ref 0.76–1.27)
Chloride: 97 mmol/L (ref 96–106)
GFR calc Af Amer: 98 mL/min/{1.73_m2} (ref >59)
GFR calc non Af Amer: 85 mL/min/{1.73_m2} (ref >59)
Globulin, Total: 2.7 g/dL (ref 1.5–4.5)
Glucose: 137 mg/dL — ABNORMAL HIGH (ref 65–99)
Potassium: 4.3 mmol/L (ref 3.5–5.2)
SODIUM: 138 mmol/L (ref 134–144)
TOTAL PROTEIN: 7.2 g/dL (ref 6.0–8.5)

## 2015-10-22 ENCOUNTER — Ambulatory Visit (INDEPENDENT_AMBULATORY_CARE_PROVIDER_SITE_OTHER): Payer: Managed Care, Other (non HMO) | Admitting: Cardiology

## 2015-10-22 ENCOUNTER — Encounter: Payer: Self-pay | Admitting: Cardiology

## 2015-10-22 VITALS — BP 142/76 | HR 59 | Ht 70.0 in | Wt 232.0 lb

## 2015-10-22 DIAGNOSIS — I4892 Unspecified atrial flutter: Secondary | ICD-10-CM | POA: Diagnosis not present

## 2015-10-22 DIAGNOSIS — R0600 Dyspnea, unspecified: Secondary | ICD-10-CM | POA: Diagnosis not present

## 2015-10-22 DIAGNOSIS — I10 Essential (primary) hypertension: Secondary | ICD-10-CM

## 2015-10-22 DIAGNOSIS — E785 Hyperlipidemia, unspecified: Secondary | ICD-10-CM

## 2015-10-22 DIAGNOSIS — R079 Chest pain, unspecified: Secondary | ICD-10-CM

## 2015-10-22 NOTE — Assessment & Plan Note (Signed)
Status post ablation. Remains in sinus today on exam.

## 2015-10-22 NOTE — Assessment & Plan Note (Signed)
Continue present medications. Management per primary care. 

## 2015-10-22 NOTE — Patient Instructions (Signed)
Medication Instructions:   NO CHANGE  Testing/Procedures:  Your physician has requested that you have en exercise stress myoview. For further information please visit [email or web address]. Please follow instruction sheet, as given.   Your physician has requested that you have an echocardiogram. Echocardiography is a painless test that uses sound waves to create images of your heart. It provides your doctor with information about the size and shape of your heart and how well your heart's chambers and valves are working. This procedure takes approximately one hour. There are no restrictions for this procedure.    Follow-Up:  Your physician recommends that you schedule a follow-up appointment in: 3-4 WEEKS WITH APP  Your physician recommends that you schedule a follow-up appointment in: 3 MONTHS WITH DR [name]

## 2015-10-22 NOTE — Assessment & Plan Note (Signed)
Arrange echocardiogram to further assess.Nuclear study to screen for ischemia.

## 2015-10-22 NOTE — Assessment & Plan Note (Signed)
Patient's blood pressure is mildly elevated today. However follow this at home and states recently it is controlled. I've asked him to follow this and keep records. At next office visit he will bring his cuff for correlation with ours. Further adjustment based on follow-up readings.

## 2015-10-22 NOTE — Assessment & Plan Note (Signed)
Symptoms are somewhat atypical in that he does not notice these with exertion but only with stress. However he has multiple risk factors including diabetes mellitus, hypertension, hyperlipidemia and prior tobacco use. We will plan to proceed with a stress nuclear study. He will need nuclear imaging due to baseline nonspecific ST changes. Would have low threshold for cardiac catheterization if abnormalities noted.

## 2015-11-01 ENCOUNTER — Telehealth (HOSPITAL_COMMUNITY): Payer: Self-pay

## 2015-11-01 NOTE — Telephone Encounter (Signed)
Encounter complete. 

## 2015-11-06 ENCOUNTER — Ambulatory Visit (HOSPITAL_COMMUNITY)
Admission: RE | Admit: 2015-11-06 | Discharge: 2015-11-06 | Disposition: A | Discharge status: Home / Self Care | Payer: Managed Care, Other (non HMO) | Source: Ambulatory Visit | Attending: Cardiology | Admitting: Cardiology

## 2015-11-06 ENCOUNTER — Ambulatory Visit (HOSPITAL_BASED_OUTPATIENT_CLINIC_OR_DEPARTMENT_OTHER)
Admission: RE | Admit: 2015-11-06 | Discharge: 2015-11-06 | Disposition: A | Discharge status: Home / Self Care | Payer: Managed Care, Other (non HMO) | Source: Ambulatory Visit | Attending: Cardiology | Admitting: Cardiology

## 2015-11-06 DIAGNOSIS — J449 Chronic obstructive pulmonary disease, unspecified: Secondary | ICD-10-CM | POA: Insufficient documentation

## 2015-11-06 DIAGNOSIS — R5383 Other fatigue: Secondary | ICD-10-CM | POA: Insufficient documentation

## 2015-11-06 DIAGNOSIS — I371 Nonrheumatic pulmonary valve insufficiency: Secondary | ICD-10-CM | POA: Diagnosis not present

## 2015-11-06 DIAGNOSIS — R079 Chest pain, unspecified: Secondary | ICD-10-CM | POA: Insufficient documentation

## 2015-11-06 DIAGNOSIS — E119 Type 2 diabetes mellitus without complications: Secondary | ICD-10-CM | POA: Insufficient documentation

## 2015-11-06 DIAGNOSIS — R9439 Abnormal result of other cardiovascular function study: Secondary | ICD-10-CM | POA: Insufficient documentation

## 2015-11-06 DIAGNOSIS — R0609 Other forms of dyspnea: Secondary | ICD-10-CM | POA: Diagnosis not present

## 2015-11-06 DIAGNOSIS — Z87891 Personal history of nicotine dependence: Secondary | ICD-10-CM | POA: Diagnosis not present

## 2015-11-06 DIAGNOSIS — R42 Dizziness and giddiness: Secondary | ICD-10-CM | POA: Insufficient documentation

## 2015-11-06 DIAGNOSIS — R110 Nausea: Secondary | ICD-10-CM | POA: Diagnosis not present

## 2015-11-06 DIAGNOSIS — E785 Hyperlipidemia, unspecified: Secondary | ICD-10-CM | POA: Diagnosis not present

## 2015-11-06 DIAGNOSIS — I10 Essential (primary) hypertension: Secondary | ICD-10-CM | POA: Insufficient documentation

## 2015-11-06 LAB — ECHOCARDIOGRAM COMPLETE
Height: 70 in
Weight: 3712 oz

## 2015-11-06 LAB — MYOCARDIAL PERFUSION IMAGING
CHL CUP MPHR: 171 {beats}/min
CHL CUP NUCLEAR SDS: 3
CHL CUP NUCLEAR SRS: 2
CHL CUP NUCLEAR SSS: 5
CSEPED: 9 min
Estimated workload: 10.1 METS
LV dias vol: 123 mL (ref 62–150)
LV sys vol: 53 mL
NUC STRESS TID: 0.87
Peak HR: 151 {beats}/min
Percent HR: 88 %
RPE: 16
Rest HR: 59 {beats}/min

## 2015-11-06 MED ORDER — TECHNETIUM TC 99M TETROFOSMIN IV KIT
10.8000 | PACK | Freq: Once | INTRAVENOUS | Status: AC | PRN
  Administered 2015-11-06: 10.8 via INTRAVENOUS
  Filled 2015-11-06: qty 11

## 2015-11-06 MED ORDER — TECHNETIUM TC 99M TETROFOSMIN IV KIT
30.2000 | PACK | Freq: Once | INTRAVENOUS | Status: AC | PRN
  Administered 2015-11-06: 30.2 via INTRAVENOUS
  Filled 2015-11-06: qty 30

## 2015-11-13 ENCOUNTER — Encounter: Payer: Self-pay | Admitting: Cardiology

## 2015-11-13 ENCOUNTER — Ambulatory Visit
Admission: RE | Admit: 2015-11-13 | Discharge: 2015-11-13 | Disposition: A | Discharge status: Home / Self Care | Payer: Managed Care, Other (non HMO) | Source: Ambulatory Visit | Attending: Cardiology | Admitting: Cardiology

## 2015-11-13 ENCOUNTER — Ambulatory Visit (INDEPENDENT_AMBULATORY_CARE_PROVIDER_SITE_OTHER): Payer: Managed Care, Other (non HMO) | Admitting: Cardiology

## 2015-11-13 VITALS — BP 128/70 | HR 66 | Ht 70.0 in | Wt 232.6 lb

## 2015-11-13 DIAGNOSIS — Z01812 Encounter for preprocedural laboratory examination: Secondary | ICD-10-CM | POA: Diagnosis not present

## 2015-11-13 DIAGNOSIS — I10 Essential (primary) hypertension: Secondary | ICD-10-CM

## 2015-11-13 DIAGNOSIS — R9439 Abnormal result of other cardiovascular function study: Secondary | ICD-10-CM

## 2015-11-13 DIAGNOSIS — I483 Typical atrial flutter: Secondary | ICD-10-CM

## 2015-11-13 DIAGNOSIS — D689 Coagulation defect, unspecified: Secondary | ICD-10-CM

## 2015-11-13 DIAGNOSIS — R0789 Other chest pain: Secondary | ICD-10-CM

## 2015-11-13 DIAGNOSIS — R931 Abnormal findings on diagnostic imaging of heart and coronary circulation: Secondary | ICD-10-CM | POA: Insufficient documentation

## 2015-11-13 DIAGNOSIS — E785 Hyperlipidemia, unspecified: Secondary | ICD-10-CM

## 2015-11-13 DIAGNOSIS — E1121 Type 2 diabetes mellitus with diabetic nephropathy: Secondary | ICD-10-CM

## 2015-11-13 DIAGNOSIS — R5383 Other fatigue: Secondary | ICD-10-CM

## 2015-11-13 DIAGNOSIS — R079 Chest pain, unspecified: Secondary | ICD-10-CM

## 2015-11-13 LAB — CBC
HCT: 43.3 % (ref 38.5–50.0)
Hemoglobin: 15.1 g/dL (ref 13.2–17.1)
MCH: 30.4 pg (ref 27.0–33.0)
MCHC: 34.9 g/dL (ref 32.0–36.0)
MCV: 87.1 fL (ref 80.0–100.0)
MPV: 10.1 fL (ref 7.5–12.5)
Platelets: 232 10*3/uL (ref 140–400)
RBC: 4.97 MIL/uL (ref 4.20–5.80)
RDW: 13.5 % (ref 11.0–15.0)
WBC: 7 10*3/uL (ref 3.8–10.8)

## 2015-11-13 LAB — APTT: aPTT: 31 seconds (ref 24–37)

## 2015-11-13 LAB — BASIC METABOLIC PANEL
BUN: 20 mg/dL (ref 7–25)
CO2: 25 mmol/L (ref 20–31)
Calcium: 9.2 mg/dL (ref 8.6–10.3)
Chloride: 102 mmol/L (ref 98–110)
Creat: 1.04 mg/dL (ref 0.60–1.35)
Glucose, Bld: 212 mg/dL — ABNORMAL HIGH (ref 65–99)
Potassium: 3.9 mmol/L (ref 3.5–5.3)
Sodium: 138 mmol/L (ref 135–146)

## 2015-11-13 LAB — TSH: TSH: 0.99 mIU/L (ref 0.40–4.50)

## 2015-11-13 LAB — PROTIME-INR
INR: 1.05 (ref <1.50)
Prothrombin Time: 13.8 seconds (ref 11.6–15.2)

## 2015-11-13 MED ORDER — NITROGLYCERIN 0.4 MG SL SUBL: 0.4000 mg | SUBLINGUAL_TABLET | SUBLINGUAL | Status: AC | PRN

## 2015-11-13 NOTE — Assessment & Plan Note (Signed)
Followed PCP

## 2015-11-13 NOTE — Assessment & Plan Note (Signed)
On statin Rx, recent lipid profile favorable

## 2015-11-13 NOTE — Progress Notes (Signed)
11/13/2015 Benjamin Hale   [birth year]  [id number]  Primary Physician Bennie PieriniMARTIN,[name], [name] Primary Cardiologist: Dr [name]  HPI:  50 y/o married male with a history of atrial flutter in 2014, s/p successful RFA then. He has a history of NIDDM, HTN, and HLD. He is a former smoker. He saw Dr [name] on 10/17/15 and complained of chest pain and DOE worrisome for angina. The pt had an echo (normal) and an exercise Myoview which was abnormal. He is seen in the office today for a pre cath work up. He continues to have intermittent chest "burning"- not exertional, and some DOE if he exerts himself. He denies any rest symptoms.    Current Outpatient Prescriptions  Medication Sig Dispense Refill  . albuterol (PROVENTIL HFA;VENTOLIN HFA) 108 (90 BASE) MCG/ACT inhaler Inhale 2 puffs into the lungs every 6 (six) hours as needed for wheezing.    Marland Kitchen. aspirin 81 MG tablet Take 81 mg by mouth daily.    . fenofibrate (TRICOR) 145 MG tablet Take 1 tablet (145 mg total) by mouth daily. 30 tablet 5  . fenofibrate 160 MG tablet TAKE ONE (1) TABLET EACH DAY 30 tablet 3  . fluticasone (FLONASE) 50 MCG/ACT nasal spray Place 2 sprays into the nose daily as needed for rhinitis or allergies.    Marland Kitchen. glucose blood (ONETOUCH VERIO) test strip Use to check BG up to once daily.  DX: type 2 DM E11.9 100 each 2  . hydrochlorothiazide (MICROZIDE) 12.5 MG capsule Take 1 capsule (12.5 mg total) by mouth daily. 30 capsule 5  . lisinopril (PRINIVIL,ZESTRIL) 40 MG tablet Take 1 tablet (40 mg total) by mouth daily. 90 tablet 3  . metFORMIN (GLUCOPHAGE) 500 MG tablet Take 1 tablet (500 mg total) by mouth 2 (two) times daily with a meal. 180 tablet 1  . omeprazole (PRILOSEC) 20 MG capsule Take 1 capsule (20 mg total) by mouth daily as needed (for acid reflux). Acid reflux. 30 capsule 5  . ONETOUCH DELICA LANCETS 33G MISC Use to check BG up to once daily 100 each 2   No current facility-administered medications for this  visit.    No Known Allergies   PMH- HTN NIDDM HLD Migraines GERD DJD- s/p C-spine surgery 2014  FM Hx-remarkable for DM and cancer  Social History   Social History  . Marital Status: Married    Spouse Name: N/A  . Number of Children: N/A  . Years of Education: N/A   Occupational History  . Not on file.   Social History Main Topics  . Smoking status: Former Smoker -- 1.50 packs/day for 22 years    Types: Cigarettes    Quit date: 06/16/2005  . Smokeless tobacco: Never Used  . Alcohol Use: Yes     Comment: 12/23/2012 previously drank heavily - quit that 8 yrs ago; might have a beer q 5 yr"  . Drug Use: No  . Sexual Activity: Yes   Other Topics Concern  . Not on file   Social History Narrative   Lives in [place] with his wife and 95 yr old son.  Works as copy Optometristmachine repairman.  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     Review of Systems: General: negative for chills, fever, night sweats or weight changes.  Cardiovascular: negative for chest pain, dyspnea on exertion, edema, orthopnea, palpitations, paroxysmal nocturnal dyspnea or shortness of breath Dermatological: negative for rash Respiratory: negative for cough or wheezing Urologic: negative for hematuria Abdominal: negative for nausea, vomiting, diarrhea,  bright red blood per rectum, melena, or hematemesis Neurologic: negative for visual changes, syncope, or dizziness All other systems reviewed and are otherwise negative except as noted above.    Blood pressure 128/70, pulse 66, height  (1.778 m), weight 232 lb 9.6 oz (105.507 kg).  General appearance: alert, cooperative and no distress Neck: no carotid bruit and no JVD Lungs: clear to auscultation bilaterally Heart: regular rate and rhythm Abdomen: soft, non-tender; bowel sounds normal; no masses,  no organomegaly Extremities: extremities normal, atraumatic, no cyanosis or edema Pulses: 2+ and symmetric Skin: Skin color, texture, turgor normal. No rashes or  lesions Neurologic: Grossly normal  EKG NSR, SB 58- on 10/22/15  ASSESSMENT AND PLAN:   Abnormal nuclear cardiac imaging test Abnormal Myoview 11/06/15- Findings consistent with prior Inferior wall myocardial infarction with peri-infarct ischemia  Chest pain with moderate risk of acute coronary syndrome Seen 10/22/15 for history of exertional dyspnea and chest burning  Atrial flutter (HCC) S/P RFA in 2014-holding NSR  Type 2 diabetes mellitus, controlled Followed PCP  Hyperlipidemia with target LDL less than 100 On statin Rx, recent lipid profile favorable  Essential hypertension Controlled, I don't think his home cuff is accurate (too high)   PLAN  Plan OP cath- The patient understands that risks included but are not limited to stroke (1 in 1000), death (1 in 1000), kidney failure [usually temporary] (1 in 500), bleeding (1 in 200), allergic reaction [possibly serious] (1 in 200).  The patient understands and agrees to proceed.   I provided new Rx for SL NTG  [name] PA-C 11/13/2015 8:51 AM

## 2015-11-13 NOTE — Assessment & Plan Note (Signed)
Seen 10/22/15 for history of exertional dyspnea and chest burning

## 2015-11-13 NOTE — Assessment & Plan Note (Signed)
Controlled, I don't think his home cuff is accurate (too high)

## 2015-11-13 NOTE — Patient Instructions (Signed)
Your physician has requested that you have a left cardiac catheterization. Cardiac catheterization is used to diagnose and/or treat various heart conditions. Doctors may recommend this procedure for a number of different reasons. The most common reason is to evaluate chest pain. Chest pain can be a symptom of coronary artery disease (CAD), and cardiac catheterization can show whether plaque is narrowing or blocking your heart's arteries. This procedure is also used to evaluate the valves, as well as measure the blood flow and oxygen levels in different parts of your heart. For further information please visit [email or web address].   Following your catheterization, you will not be allowed to drive for 3 days. No lifting, pushing, or pulling greater that 10 pounds is allowed for 1 week.  You will be required to have the following tests prior to the procedure:  1. Blood work - the blood work can be done no more than 7 days prior to the procedure. It can be done at any [facility] lab. There is one downstairs on the first floor of this building and one in the [facility] building [phone]([address], [address]).  2. Chest Xray - the chest xray order has already been placed at [facility] at the [facility].

## 2015-11-13 NOTE — Assessment & Plan Note (Signed)
S/P RFA in 2014-holding NSR

## 2015-11-13 NOTE — Assessment & Plan Note (Signed)
Abnormal Myoview 11/06/15- Findings consistent with prior Inferior wall myocardial infarction with peri-infarct ischemia

## 2015-11-14 ENCOUNTER — Other Ambulatory Visit: Payer: Self-pay | Admitting: Cardiovascular Disease

## 2015-11-14 DIAGNOSIS — R9439 Abnormal result of other cardiovascular function study: Secondary | ICD-10-CM

## 2015-11-14 DIAGNOSIS — R0789 Other chest pain: Secondary | ICD-10-CM

## 2015-11-16 ENCOUNTER — Encounter (HOSPITAL_COMMUNITY)
Admission: RE | Disposition: A | Discharge status: Home / Self Care | Payer: Self-pay | Source: Ambulatory Visit | Attending: Cardiology

## 2015-11-16 ENCOUNTER — Encounter (HOSPITAL_COMMUNITY): Payer: Self-pay | Admitting: Cardiology

## 2015-11-16 ENCOUNTER — Ambulatory Visit (HOSPITAL_COMMUNITY)
Admission: RE | Admit: 2015-11-16 | Discharge: 2015-11-16 | Disposition: A | Discharge status: Home / Self Care | Payer: Managed Care, Other (non HMO) | Source: Ambulatory Visit | Attending: Cardiology | Admitting: Cardiology

## 2015-11-16 DIAGNOSIS — I10 Essential (primary) hypertension: Secondary | ICD-10-CM | POA: Diagnosis not present

## 2015-11-16 DIAGNOSIS — Z7985 Long-term (current) use of injectable non-insulin antidiabetic drugs: Secondary | ICD-10-CM

## 2015-11-16 DIAGNOSIS — R931 Abnormal findings on diagnostic imaging of heart and coronary circulation: Secondary | ICD-10-CM | POA: Diagnosis present

## 2015-11-16 DIAGNOSIS — E1169 Type 2 diabetes mellitus with other specified complication: Secondary | ICD-10-CM | POA: Diagnosis present

## 2015-11-16 DIAGNOSIS — Z7982 Long term (current) use of aspirin: Secondary | ICD-10-CM | POA: Diagnosis not present

## 2015-11-16 DIAGNOSIS — H5989 Other postprocedural complications and disorders of eye and adnexa, not elsewhere classified: Secondary | ICD-10-CM | POA: Diagnosis not present

## 2015-11-16 DIAGNOSIS — Z7984 Long term (current) use of oral hypoglycemic drugs: Secondary | ICD-10-CM | POA: Insufficient documentation

## 2015-11-16 DIAGNOSIS — R9439 Abnormal result of other cardiovascular function study: Secondary | ICD-10-CM | POA: Diagnosis present

## 2015-11-16 DIAGNOSIS — E785 Hyperlipidemia, unspecified: Secondary | ICD-10-CM | POA: Diagnosis present

## 2015-11-16 DIAGNOSIS — E119 Type 2 diabetes mellitus without complications: Secondary | ICD-10-CM | POA: Diagnosis not present

## 2015-11-16 DIAGNOSIS — K219 Gastro-esophageal reflux disease without esophagitis: Secondary | ICD-10-CM | POA: Insufficient documentation

## 2015-11-16 DIAGNOSIS — Z87891 Personal history of nicotine dependence: Secondary | ICD-10-CM | POA: Diagnosis not present

## 2015-11-16 DIAGNOSIS — R079 Chest pain, unspecified: Secondary | ICD-10-CM | POA: Diagnosis present

## 2015-11-16 DIAGNOSIS — R0789 Other chest pain: Secondary | ICD-10-CM

## 2015-11-16 HISTORY — PX: CARDIAC CATHETERIZATION: SHX172

## 2015-11-16 LAB — GLUCOSE, CAPILLARY
Glucose-Capillary: 138 mg/dL — ABNORMAL HIGH (ref 65–99)
Glucose-Capillary: 132 mg/dL — ABNORMAL HIGH (ref 65–99)

## 2015-11-16 SURGERY — LEFT HEART CATH AND CORONARY ANGIOGRAPHY

## 2015-11-16 MED ORDER — LIDOCAINE HCL (PF) 1 % IJ SOLN
INTRAMUSCULAR | Status: AC
  Filled 2015-11-16: qty 30

## 2015-11-16 MED ORDER — SODIUM CHLORIDE 0.9 % IV SOLN: 250.0000 mL | INTRAVENOUS | Status: DC | PRN

## 2015-11-16 MED ORDER — MIDAZOLAM HCL 2 MG/2ML IJ SOLN
INTRAMUSCULAR | Status: AC
  Filled 2015-11-16: qty 2

## 2015-11-16 MED ORDER — FENTANYL CITRATE (PF) 100 MCG/2ML IJ SOLN
INTRAMUSCULAR | Status: DC | PRN
  Administered 2015-11-16: 50 ug via INTRAVENOUS

## 2015-11-16 MED ORDER — IOPAMIDOL (ISOVUE-370) INJECTION 76%
INTRAVENOUS | Status: DC | PRN
  Administered 2015-11-16: 85 mL via INTRA_ARTERIAL

## 2015-11-16 MED ORDER — MIDAZOLAM HCL 2 MG/2ML IJ SOLN
INTRAMUSCULAR | Status: DC | PRN
  Administered 2015-11-16: 2 mg via INTRAVENOUS

## 2015-11-16 MED ORDER — LIDOCAINE HCL (PF) 1 % IJ SOLN
INTRAMUSCULAR | Status: DC | PRN
  Administered 2015-11-16: 10:00:00

## 2015-11-16 MED ORDER — VERAPAMIL HCL 2.5 MG/ML IV SOLN
INTRAVENOUS | Status: AC
  Filled 2015-11-16: qty 2

## 2015-11-16 MED ORDER — ONDANSETRON HCL 4 MG/2ML IJ SOLN: 4.0000 mg | Freq: Four times a day (QID) | INTRAMUSCULAR | Status: DC | PRN

## 2015-11-16 MED ORDER — SODIUM CHLORIDE 0.9% FLUSH: 3.0000 mL | INTRAVENOUS | Status: DC | PRN

## 2015-11-16 MED ORDER — IOPAMIDOL (ISOVUE-370) INJECTION 76%
INTRAVENOUS | Status: AC
  Filled 2015-11-16: qty 100

## 2015-11-16 MED ORDER — FENTANYL CITRATE (PF) 100 MCG/2ML IJ SOLN
INTRAMUSCULAR | Status: AC
  Filled 2015-11-16: qty 2

## 2015-11-16 MED ORDER — HEPARIN SODIUM (PORCINE) 1000 UNIT/ML IJ SOLN
INTRAMUSCULAR | Status: DC | PRN
  Administered 2015-11-16: 5500 [IU] via INTRAVENOUS

## 2015-11-16 MED ORDER — SODIUM CHLORIDE 0.9% FLUSH: 3.0000 mL | Freq: Two times a day (BID) | INTRAVENOUS | Status: DC

## 2015-11-16 MED ORDER — ACETAMINOPHEN 325 MG PO TABS: 650.0000 mg | ORAL_TABLET | ORAL | Status: DC | PRN

## 2015-11-16 MED ORDER — SODIUM CHLORIDE 0.9 % WEIGHT BASED INFUSION
3.0000 mL/kg/h | INTRAVENOUS | Status: DC
  Administered 2015-11-16: 3 mL/kg/h via INTRAVENOUS

## 2015-11-16 MED ORDER — SODIUM CHLORIDE 0.9 % WEIGHT BASED INFUSION: 1.0000 mL/kg/h | INTRAVENOUS | Status: DC

## 2015-11-16 MED ORDER — ASPIRIN 81 MG PO CHEW
81.0000 mg | CHEWABLE_TABLET | ORAL | Status: DC
Start: 2015-11-17 — End: 2015-11-16

## 2015-11-16 MED ORDER — MORPHINE SULFATE (PF) 2 MG/ML IV SOLN: 2.0000 mg | INTRAVENOUS | Status: DC | PRN

## 2015-11-16 MED ORDER — HEPARIN (PORCINE) IN NACL 2-0.9 UNIT/ML-% IJ SOLN
INTRAMUSCULAR | Status: AC
  Filled 2015-11-16: qty 1000

## 2015-11-16 MED ORDER — SODIUM CHLORIDE 0.9 % WEIGHT BASED INFUSION: 3.0000 mL/kg/h | INTRAVENOUS | Status: DC

## 2015-11-16 MED ORDER — HEPARIN (PORCINE) IN NACL 2-0.9 UNIT/ML-% IJ SOLN
INTRAMUSCULAR | Status: DC | PRN
  Administered 2015-11-16: 09:00:00 via INTRA_ARTERIAL

## 2015-11-16 SURGICAL SUPPLY — 9 items
CATH INFINITI 5 FR JL3.5 (CATHETERS) ×2 IMPLANT
CATH INFINITI JR4 5F (CATHETERS) ×2 IMPLANT
DEVICE RAD COMP TR BAND LRG (VASCULAR PRODUCTS) ×2 IMPLANT
GLIDESHEATH SLEND A-KIT 6F 22G (SHEATH) ×2 IMPLANT
KIT HEART LEFT (KITS) ×2 IMPLANT
PACK CARDIAC CATHETERIZATION (CUSTOM PROCEDURE TRAY) ×2 IMPLANT
TRANSDUCER W/STOPCOCK (MISCELLANEOUS) ×4 IMPLANT
TUBING CIL FLEX 10 FLL-RA (TUBING) ×2 IMPLANT
WIRE SAFE-T 1.5MM-J .035X260CM (WIRE) ×2 IMPLANT

## 2015-11-16 NOTE — Discharge Instructions (Signed)
Radial Site Care °Refer to this sheet in the next few weeks. These instructions provide you with information about caring for yourself after your procedure. Your health care provider may also give you more specific instructions. Your treatment has been planned according to current medical practices, but problems sometimes occur. Call your health care provider if you have any problems or questions after your procedure. °WHAT TO EXPECT AFTER THE PROCEDURE °After your procedure, it is typical to have the following: °· Bruising at the radial site that usually fades within 1-2 weeks. °· Blood collecting in the tissue (hematoma) that may be painful to the touch. It should usually decrease in size and tenderness within 1-2 weeks. °HOME CARE INSTRUCTIONS °· Take medicines only as directed by your health care provider. °· You may shower 24-48 hours after the procedure or as directed by your health care provider. Remove the bandage (dressing) and gently wash the site with plain soap and water. Pat the area dry with a clean towel. Do not rub the site, because this may cause bleeding. °· Do not take baths, swim, or use a hot tub until your health care provider approves. °· Check your insertion site every day for redness, swelling, or drainage. °· Do not apply powder or lotion to the site. °· Do not flex or bend the affected arm for 24 hours or as directed by your health care provider. °· Do not push or pull heavy objects with the affected arm for 24 hours or as directed by your health care provider. °· Do not lift over 10 lb (4.5 kg) for 5 days after your procedure or as directed by your health care provider. °· Ask your health care provider when it is okay to: °¨ Return to work or school. °¨ Resume usual physical activities or sports. °¨ Resume sexual activity. °· Do not drive home if you are discharged the same day as the procedure. Have someone else drive you. °· You may drive 24 hours after the procedure unless otherwise  instructed by your health care provider. °· Do not operate machinery or power tools for 24 hours after the procedure. °· If your procedure was done as an outpatient procedure, which means that you went home the same day as your procedure, a responsible adult should be with you for the first 24 hours after you arrive home. °· Keep all follow-up visits as directed by your health care provider. This is important. °SEEK MEDICAL CARE IF: °· You have a fever. °· You have chills. °· You have increased bleeding from the radial site. Hold pressure on the site. °SEEK IMMEDIATE MEDICAL CARE IF: °· You have unusual pain at the radial site. °· You have redness, warmth, or swelling at the radial site. °· You have drainage (other than a small amount of blood on the dressing) from the radial site. °· The radial site is bleeding, and the bleeding does not stop after 30 minutes of holding steady pressure on the site. °· Your arm or hand becomes pale, cool, tingly, or numb. °  °This information is not intended to replace advice given to you by your health care provider. Make sure you discuss any questions you have with your health care provider. °  °Document Released: 07/05/2010 Document Revised: 06/23/2014 Document Reviewed: 12/19/2013 °Elsevier Interactive Patient Education ©2016 Elsevier Inc. ° °

## 2015-11-16 NOTE — Progress Notes (Signed)
Pt c/o vision changes "spots in left eye" .  Nada BoozerLaura Ingold PA in to see pt.  Episode lasted 30 seconds, eye dilation bilateral equal, grips equal bilateral, feet pressure on command good.  Vital signs remained stable and unchanged.

## 2015-11-16 NOTE — Progress Notes (Signed)
Called to see pt after he complained of blurred vision in left eye lasting 30 sec.  He denies this happening before - he does have a history of migraines but no visual disturbances.  He was sitting in chair.  On exam, face symmetrical with movement, tongue midline.  He follows commands, oriented to person, place and time. Pupils reacted,  Upper ext grips equal and lower ext.  Heart RRR   Discussed with Dr. Herbie BaltimoreHarding.  No further testing at this time though he is instructed to call if further abnormalities.

## 2015-11-16 NOTE — Progress Notes (Signed)
Benjamin BoozerLaura Ingold Pa called to state Dr. Herbie BaltimoreHarding did not feet evaluation was needed at this time. Pt instructed to alert nurse if further visual changes occurred.

## 2015-11-16 NOTE — Interval H&P Note (Signed)
History and Physical Interval Note:  11/16/2015 6:51 AM  Benjamin Hale  has presented today for surgery, with the diagnosis of abnormal stress test for chest pain with high risk for cardiac etiology/angina class III. The various methods of treatment have been discussed with the patient and family. After consideration of risks, benefits and other options for treatment, the patient has consented to  Procedure(s): Left Heart Cath and Coronary Angiography (N/A) with possible percutaneous coronary mention as a surgical intervention .  The patient's history has been reviewed, patient examined, no change in status, stable for surgery.  I have reviewed the patient's chart and labs.  Questions were answered to the patient's satisfaction.    Cath Lab Visit (complete for each Cath Lab visit)  Clinical Evaluation Leading to the Procedure:   ACS: No.  Non-ACS:    Anginal Classification: CCS III  Anti-ischemic medical therapy: Minimal Therapy (1 class of medications)  Non-Invasive Test Results: Low-risk stress test findings: cardiac mortality <1%/year; however over read of this suggested intermediate risk  Prior CABG: No previous CABG  Ischemic Symptoms? CCS III (Marked limitation of ordinary activity) Anti-ischemic Medical Therapy? Minimal Therapy (1 class of medications) Non-invasive Test Results? Low-risk stress test findings: cardiac mortality <1%/year Prior CABG? No Previous CABG   Patient Information:   1-2V CAD, no prox LAD  U (5)  Indication: 14; Score: 5   Patient Information:   CTO of 1 vessel, no other CAD  I (3)  Indication: 24; Score: 3   Patient Information:   1V CAD with prox LAD  A (7)  Indication: 30; Score: 7   Patient Information:   2V-CAD with prox LAD  A (7)  Indication: 36; Score: 7   Patient Information:   3V-CAD without LMCA  A (7)  Indication: 42; Score: 7   Patient Information:   3V-CAD without LMCA With Abnormal LV systolic  function  A (9)  Indication: 48; Score: 9   Patient Information:   LMCA-CAD  A (9)  Indication: 49; Score: 9   Patient Information:   2V-CAD with prox LAD PCI  A (7)  Indication: 62; Score: 7   Patient Information:   2V-CAD with prox LAD CABG  A (8)  Indication: 62; Score: 8   Patient Information:   3V-CAD without LMCA With Low CAD burden(i.e., 3 focal stenoses, low SYNTAX score) PCI  A (7)  Indication: 63; Score: 7   Patient Information:   3V-CAD without LMCA With Low CAD burden(i.e., 3 focal stenoses, low SYNTAX score) CABG  A (9)  Indication: 63; Score: 9   Patient Information:   3V-CAD without LMCA E06c - Intermediate-high CAD burden (i.e., multiple diffuse lesions, presence of CTO, or high SYNTAX score) PCI  U (4)  Indication: 64; Score: 4   Patient Information:   3V-CAD without LMCA E06c - Intermediate-high CAD burden (i.e., multiple diffuse lesions, presence of CTO, or high SYNTAX score) CABG  A (9)  Indication: 64; Score: 9   Patient Information:   LMCA-CAD With Isolated LMCA stenosis  PCI  U (6)  Indication: 65; Score: 6   Patient Information:   LMCA-CAD With Isolated LMCA stenosis  CABG  A (9)  Indication: 65; Score: 9   Patient Information:   LMCA-CAD Additional CAD, low CAD burden (i.e., 1- to 2-vessel additional involvement, low SYNTAX score) PCI  U (5)  Indication: 66; Score: 5   Patient Information:   LMCA-CAD Additional CAD, low CAD burden (i.e., 1- to 2-vessel  additional involvement, low SYNTAX score) CABG  A (9)  Indication: 66; Score: 9   Patient Information:   LMCA-CAD Additional CAD, intermediate-high CAD burden (i.e., 3-vessel involvement, presence of CTO, or high SYNTAX score) PCI  I (3)  Indication: 67; Score: 3   Patient Information:   LMCA-CAD Additional CAD, intermediate-high CAD burden (i.e., 3-vessel involvement, presence of CTO, or high SYNTAX score) CABG  A (9)   Indication: 67; Score: 9    Bryan Lemma

## 2015-11-16 NOTE — H&P (View-Only) (Signed)
11/13/2015 [name]   24-Jan-1966  [id number]  Primary Physician [name], [name] Primary Cardiologist: Dr Jens Som  HPI:  50 y/o married male with a history of atrial flutter in 2014, s/p successful RFA then. He has a history of NIDDM, HTN, and HLD. He is a former smoker. He saw Dr Jens Som on 10/17/15 and complained of chest pain and DOE worrisome for angina. The pt had an echo (normal) and an exercise Myoview which was abnormal. He is seen in the office today for a pre cath work up. He continues to have intermittent chest "burning"- not exertional, and some DOE if he exerts himself. He denies any rest symptoms.    Current Outpatient Prescriptions  Medication Sig Dispense Refill  . albuterol (PROVENTIL HFA;VENTOLIN HFA) 108 (90 BASE) MCG/ACT inhaler Inhale 2 puffs into the lungs every 6 (six) hours as needed for wheezing.    Marland Kitchen aspirin 81 MG tablet Take 81 mg by mouth daily.    . fenofibrate (TRICOR) 145 MG tablet Take 1 tablet (145 mg total) by mouth daily. 30 tablet 5  . fenofibrate 160 MG tablet TAKE ONE (1) TABLET EACH DAY 30 tablet 3  . fluticasone (FLONASE) 50 MCG/ACT nasal spray Place 2 sprays into the nose daily as needed for rhinitis or allergies.    Marland Kitchen glucose blood (ONETOUCH VERIO) test strip Use to check BG up to once daily.  DX: type 2 DM E11.9 100 each 2  . hydrochlorothiazide (MICROZIDE) 12.5 MG capsule Take 1 capsule (12.5 mg total) by mouth daily. 30 capsule 5  . lisinopril (PRINIVIL,ZESTRIL) 40 MG tablet Take 1 tablet (40 mg total) by mouth daily. 90 tablet 3  . metFORMIN (GLUCOPHAGE) 500 MG tablet Take 1 tablet (500 mg total) by mouth 2 (two) times daily with a meal. 180 tablet 1  . omeprazole (PRILOSEC) 20 MG capsule Take 1 capsule (20 mg total) by mouth daily as needed (for acid reflux). Acid reflux. 30 capsule 5  . ONETOUCH DELICA LANCETS 33G MISC Use to check BG up to once daily 100 each 2   No current facility-administered medications for this  visit.    No Known Allergies   PMH- HTN NIDDM HLD Migraines GERD DJD- s/p C-spine surgery 2014  FM Hx-remarkable for DM and cancer  Social History   Social History  . Marital Status: Married    Spouse Name: N/A  . Number of Children: N/A  . Years of Education: N/A   Occupational History  . Not on file.   Social History Main Topics  . Smoking status: Former Smoker -- 1.50 packs/day for 22 years    Types: Cigarettes    Quit date: 06/16/2005  . Smokeless tobacco: Never Used  . Alcohol Use: Yes     Comment: 12/23/2012 previously drank heavily - quit that 8 yrs ago; might have a beer q 5 yr"  . Drug Use: No  . Sexual Activity: Yes   Other Topics Concern  . Not on file   Social History Narrative   Lives in [place] with his wife and 55 yr old son.  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     Review of Systems: General: negative for chills, fever, night sweats or weight changes.  Cardiovascular: negative for chest pain, dyspnea on exertion, edema, orthopnea, palpitations, paroxysmal nocturnal dyspnea or shortness of breath Dermatological: negative for rash Respiratory: negative for cough or wheezing Urologic: negative for hematuria Abdominal: negative for nausea, vomiting, diarrhea,  bright red blood per rectum, melena, or hematemesis Neurologic: negative for visual changes, syncope, or dizziness All other systems reviewed and are otherwise negative except as noted above.    Blood pressure 128/70, pulse 66, height 5' 10" (1.778 m), weight 232 lb 9.6 oz (105.507 kg).  General appearance: alert, cooperative and no distress Neck: no carotid bruit and no JVD Lungs: clear to auscultation bilaterally Heart: regular rate and rhythm Abdomen: soft, non-tender; bowel sounds normal; no masses,  no organomegaly Extremities: extremities normal, atraumatic, no cyanosis or edema Pulses: 2+ and symmetric Skin: Skin color, texture, turgor normal. No rashes or  lesions Neurologic: Grossly normal  EKG NSR, SB 58- on 10/22/15  ASSESSMENT AND PLAN:   Abnormal nuclear cardiac imaging test Abnormal Myoview 11/06/15- Findings consistent with prior Inferior wall myocardial infarction with peri-infarct ischemia  Chest pain with moderate risk of acute coronary syndrome Seen 10/22/15 for history of exertional dyspnea and chest burning  Atrial flutter (HCC) S/P RFA in 2014-holding NSR  Type 2 diabetes mellitus, controlled Followed PCP  Hyperlipidemia with target LDL less than 100 On statin Rx, recent lipid profile favorable  Essential hypertension Controlled, I don't think his home cuff is accurate (too high)   PLAN  Plan OP cath- The patient understands that risks included but are not limited to stroke (1 in 1000), death (1 in 1000), kidney failure [usually temporary] (1 in 500), bleeding (1 in 200), allergic reaction [possibly serious] (1 in 200).  The patient understands and agrees to proceed.   I provided new Rx for SL NTG  Jamaine Quintin PA-C 11/13/2015 8:51 AM 

## 2016-01-15 NOTE — Progress Notes (Signed)
HPI: FU atrial flutter. He developed AFlutter with RVR after cervical decompression and discectomy for cervical spondylosis in April 2014. He was rate controlled with IV diltiazem. He did not convert on his own and underwent DCCV within 24 hours of onset. Patient had recurrent atrial flutt and underwent ablation in July 2014. Nuclear study May 2017 showed prior inferior infarct with peri-infarct ischemia and ejection fraction 57%. Echocardiogram May 2017 showed normal LV function. Cardiac catheterization June 2017 showed normal coronary arteries and normal LV systolic function.Since last seen,   Current Outpatient Prescriptions  Medication Sig Dispense Refill  . albuterol (PROVENTIL HFA;VENTOLIN HFA) 108 (90 BASE) MCG/ACT inhaler Inhale 2 puffs into the lungs every 6 (six) hours as needed for wheezing.    Marland Kitchen aspirin EC 81 MG tablet Take 81 mg by mouth daily.    Marland Kitchen CINNAMON PO Take 1 capsule by mouth daily.    . fenofibrate 160 MG tablet TAKE ONE (1) TABLET EACH DAY 30 tablet 3  . fluticasone (FLONASE) 50 MCG/ACT nasal spray Place 2 sprays into the nose daily as needed for rhinitis or allergies.    Marland Kitchen glucose blood (ONETOUCH VERIO) test strip Use to check BG up to once daily.  DX: type 2 DM E11.9 100 each 2  . hydrochlorothiazide (MICROZIDE) 12.5 MG capsule Take 1 capsule (12.5 mg total) by mouth daily. 30 capsule 5  . ibuprofen (ADVIL,MOTRIN) 200 MG tablet Take 400-800 mg by mouth every 6 (six) hours as needed (For headache or pain.).    Marland Kitchen lisinopril (PRINIVIL,ZESTRIL) 40 MG tablet Take 1 tablet (40 mg total) by mouth daily. 90 tablet 3  . metFORMIN (GLUCOPHAGE) 500 MG tablet Take 1 tablet (500 mg total) by mouth 2 (two) times daily with a meal. (Patient taking differently: Take 500 mg by mouth at bedtime. ) 180 tablet 1  . Multiple Vitamin (MULTIVITAMIN WITH MINERALS) TABS tablet Take 1 tablet by mouth daily.    . nitroGLYCERIN (NITROSTAT) 0.4 MG SL tablet Place 1 tablet (0.4 mg total) under  the tongue every 5 (five) minutes as needed for chest pain. 25 tablet 3  . omeprazole (PRILOSEC) 20 MG capsule Take 1 capsule (20 mg total) by mouth daily as needed (for acid reflux). Acid reflux. 30 capsule 5  . ONETOUCH DELICA LANCETS 33G MISC Use to check BG up to once daily 100 each 2  . Probiotic Product (PROBIOTIC PO) Take 1 capsule by mouth daily.     No current facility-administered medications for this visit.      Past Medical History:  Diagnosis Date  . Anginal pain (HCC)   . Arthritis    "neck" (12/23/2012)  . Asthma   . Atrial flutter (HCC)    a. s/p cervical spine surgery 4/14 => s/p DCCV;  b.  Echo 09/2012:  Mild LVH, EF 60-65%, Gr 2 DD.c. s/p atrial flutter ablation 12/23/2012 by Dr Johney Frame  . Cervical disc disease    s/p cervical spine surgery 4/14  . COPD (chronic obstructive pulmonary disease) (HCC)   . Diabetes mellitus without complication (HCC)   . Exertional shortness of breath   . GERD (gastroesophageal reflux disease)   . Headache    "weekly" (12/23/2012)  . HTN (hypertension)   . Hyperlipidemia   . Hypogonadism male   . Migraines    "once q 3-4 months" (12/23/2012)    Past Surgical History:  Procedure Laterality Date  . ANTERIOR CERVICAL DECOMP/DISCECTOMY FUSION N/A 09/22/2012   Procedure: ANTERIOR CERVICAL  DECOMPRESSION/DISCECTOMY FUSION 2 LEVELS;  Surgeon: Karn Cassis, MD;  Location: [facility]UR[facility] ORS;  Service: Neurosurgery;  Laterality: N/A;  Cervical five-six Cervical six-seven Anterior cervical decompression/diskectomy/fusion  . ATRIAL FLUTTER ABLATION  12/23/2012   CTI ablation by Dr Johney Frame  . ATRIAL FLUTTER ABLATION N/A 12/23/2012   Procedure: ATRIAL FLUTTER ABLATION;  Surgeon: Hillis Range, MD;  Location: [facility] CATH LAB;  Service: Cardiovascular;  Laterality: N/A;  . CARDIAC CATHETERIZATION N/A 11/16/2015   Procedure: Left Heart Cath and Coronary Angiography;  Surgeon: Marykay Lex, MD;  Location: [facility] INVASIVE CV LAB;  Service: Cardiovascular;   Laterality: N/A;  . CARDIOVERSION N/A 09/23/2012   Procedure: CARDIOVERSION;  Surgeon: Gaylord Shih, MD;  Location: [facility] OR;  Service: Cardiovascular;  Laterality: N/A;  . TONSILLECTOMY  1973  . TREATMENT FISTULA ANAL  ~ 2007  . WISDOM TOOTH EXTRACTION      Social History   Social History  . Marital status: Married    Spouse name: N/A  . Number of children: N/A  . Years of education: N/A   Occupational History  . Not on file.   Social History Main Topics  . Smoking status: Former Smoker    Packs/day: 1.50    Years: 22.00    Types: Cigarettes    Quit date: 06/16/2005  . Smokeless tobacco: Never Used  . Alcohol use Yes     Comment: 12/23/2012 previously drank heavily - quit that 8 yrs ago; might have a beer q 5 yr"  . Drug use: No  . Sexual activity: Yes   Other Topics Concern  . Not on file   Social History Narrative   Lives in [place] with his wife and 13 yr old son.  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    Family History  Problem Relation Age of Onset  . Lung cancer Mother     died @ 12  . Brain cancer Father     died @ 64  . Diabetes Maternal Uncle   . Diabetes Maternal Grandfather     ROS: no fevers or chills, productive cough, hemoptysis, dysphasia, odynophagia, melena, hematochezia, dysuria, hematuria, rash, seizure activity, orthopnea, PND, pedal edema, claudication. Remaining systems are negative.  Physical Exam: Well-developed well-nourished in no acute distress.  Skin is warm and dry.  HEENT is normal.  Neck is supple.  Chest is clear to auscultation with normal expansion.  Cardiovascular exam is regular rate and rhythm.  Abdominal exam nontender or distended. No masses palpated. Extremities show no edema. neuro grossly intact  ECG    This encounter was created in error - please disregard.

## 2016-01-23 ENCOUNTER — Encounter: Payer: Managed Care, Other (non HMO) | Admitting: Cardiology

## 2016-01-23 DIAGNOSIS — R0989 Other specified symptoms and signs involving the circulatory and respiratory systems: Secondary | ICD-10-CM

## 2016-01-24 ENCOUNTER — Encounter: Payer: Self-pay | Admitting: *Deleted

## 2016-01-24 NOTE — Progress Notes (Signed)
Letter mailed

## 2016-01-28 ENCOUNTER — Encounter: Payer: Self-pay | Admitting: Pediatrics

## 2016-01-28 ENCOUNTER — Ambulatory Visit (INDEPENDENT_AMBULATORY_CARE_PROVIDER_SITE_OTHER): Payer: Managed Care, Other (non HMO) | Admitting: Pediatrics

## 2016-01-28 VITALS — BP 120/79 | HR 67 | Temp 98.1°F | Ht 70.0 in | Wt 234.6 lb

## 2016-01-28 DIAGNOSIS — E1121 Type 2 diabetes mellitus with diabetic nephropathy: Secondary | ICD-10-CM | POA: Diagnosis not present

## 2016-01-28 DIAGNOSIS — I10 Essential (primary) hypertension: Secondary | ICD-10-CM | POA: Diagnosis not present

## 2016-01-28 DIAGNOSIS — J069 Acute upper respiratory infection, unspecified: Secondary | ICD-10-CM

## 2016-01-28 DIAGNOSIS — R509 Fever, unspecified: Secondary | ICD-10-CM

## 2016-01-28 DIAGNOSIS — T148 Other injury of unspecified body region: Secondary | ICD-10-CM | POA: Diagnosis not present

## 2016-01-28 DIAGNOSIS — W57XXXA Bitten or stung by nonvenomous insect and other nonvenomous arthropods, initial encounter: Secondary | ICD-10-CM | POA: Diagnosis not present

## 2016-01-28 MED ORDER — DOXYCYCLINE HYCLATE 100 MG PO TABS: 100.0000 mg | ORAL_TABLET | Freq: Two times a day (BID) | ORAL | 0 refills | Status: DC

## 2016-01-28 NOTE — Progress Notes (Signed)
    Subjective:    Patient ID: Sharyl Nimroderrance D Dimaria, male    DOB: 26-May-1966, 50 y.o.   MRN: [id number]  CC: Generalized Body Aches; Cough; Sinus pressure; Headache; and Fever   HPI: Sharyl Nimroderrance D Chalfin is a 50 y.o. male presenting for Generalized Body Aches; Cough; Sinus pressure; Headache; and Fever  Two days ago was exhausted Woke up yesterday was hurting everywhere Hurt everywhere Neck hurt Temp to 100.5 Sinuses bothering him Chest is heavy, not SOB Minimal cough Non-productive  No appetite Energy level is better today Has stayed in bed most of today On nyquil Has had lots of tickbites recently mowing the lawn   Relevant past medical, surgical, family and social history reviewed. Interim medical history since our last visit reviewed. Allergies and medications reviewed and updated.  History  Smoking Status  . Former Smoker  . Packs/day: 1.50  . Years: 22.00  . Types: Cigarettes  . Quit date: 06/16/2005  Smokeless Tobacco  . Never Used    ROS: Per HPI      Objective:    BP 120/79   Pulse 67   Temp 98.1 F (36.7 C) (Oral)   Ht 5\' 10"  (1.778 m)   Wt 234 lb 9.6 oz (106.4 kg)   SpO2 96%   BMI 33.66 kg/m   Wt Readings from Last 3 Encounters:  01/28/16 234 lb 9.6 oz (106.4 kg)  11/16/15 234 lb (106.1 kg)  11/13/15 232 lb 9.6 oz (105.5 kg)     Gen: NAD, alert, cooperative with exam, NCAT EYES: EOMI, no conjunctival injection, or no icterus ENT:  TMs pearly gray b/l, OP without erythema LYMPH: no cervical LAD CV: NRRR, normal S1/S2, no murmur, distal pulses 2+ b/l Resp: CTABL, no wheezes, normal WOB Abd: +BS, soft, NTND. no guarding or organomegaly Ext: No edema, warm Neuro: Alert and oriented, strength equal b/l UE and LE, coordination grossly normal MSK: normal muscle bulk     Assessment & Plan:  Harriett Sineerrance was seen today for generalized body aches, some cough, fever. Given tick bite exposure, will treat with doxycycline for 10 days. Discussed symptomatic  care for URI symptoms.  Diagnoses and all orders for this visit:  Tick bite -     doxycycline (VIBRA-TABS) 100 MG tablet; Take 1 tablet (100 mg total) by mouth 2 (two) times daily.  Fever, unspecified -     doxycycline (VIBRA-TABS) 100 MG tablet; Take 1 tablet (100 mg total) by mouth 2 (two) times daily.  Controlled type 2 diabetes mellitus with diabetic nephropathy, without long-term current use of insulin (HCC) Blood sugars increasing, cont to decrease sugar in diet Has follow up appt in 10 days Taking metformin just once a day Last a1c was 6 six months ago  Essential hypertension Well controlled today, cont meds   Follow up plan: As scheduled  Rex Krasarol Vincent, MD [facility] 01/28/2016, 2:33 PM

## 2016-01-28 NOTE — Patient Instructions (Signed)
Netipot with distilled water 2-3 times a day to clear out sinuses Or Normal saline nasal spray Flonase steroid nasal spray Ibuprofen 600mg  three times a day as needed Lots of fluids

## 2016-02-07 ENCOUNTER — Encounter: Payer: Self-pay | Admitting: Nurse Practitioner

## 2016-02-07 ENCOUNTER — Ambulatory Visit (INDEPENDENT_AMBULATORY_CARE_PROVIDER_SITE_OTHER): Payer: Managed Care, Other (non HMO) | Admitting: Nurse Practitioner

## 2016-02-07 VITALS — BP 118/73 | HR 64 | Temp 97.4°F | Ht 70.0 in | Wt 238.0 lb

## 2016-02-07 DIAGNOSIS — E119 Type 2 diabetes mellitus without complications: Secondary | ICD-10-CM

## 2016-02-07 DIAGNOSIS — I483 Typical atrial flutter: Secondary | ICD-10-CM

## 2016-02-07 DIAGNOSIS — E1121 Type 2 diabetes mellitus with diabetic nephropathy: Secondary | ICD-10-CM

## 2016-02-07 DIAGNOSIS — I10 Essential (primary) hypertension: Secondary | ICD-10-CM | POA: Diagnosis not present

## 2016-02-07 DIAGNOSIS — E785 Hyperlipidemia, unspecified: Secondary | ICD-10-CM

## 2016-02-07 DIAGNOSIS — K219 Gastro-esophageal reflux disease without esophagitis: Secondary | ICD-10-CM

## 2016-02-07 LAB — BAYER DCA HB A1C WAIVED: HB A1C (BAYER DCA - WAIVED): 6.9 % (ref <7.0)

## 2016-02-07 MED ORDER — HYDROCHLOROTHIAZIDE 12.5 MG PO CAPS: 12.5000 mg | ORAL_CAPSULE | Freq: Every day | ORAL | 5 refills | Status: DC

## 2016-02-07 MED ORDER — OMEPRAZOLE 20 MG PO CPDR: 20.0000 mg | DELAYED_RELEASE_CAPSULE | Freq: Every day | ORAL | 5 refills | Status: DC | PRN

## 2016-02-07 MED ORDER — LISINOPRIL 40 MG PO TABS: 40.0000 mg | ORAL_TABLET | Freq: Every day | ORAL | 3 refills | Status: DC

## 2016-02-07 MED ORDER — FENOFIBRATE 160 MG PO TABS: 160.0000 mg | ORAL_TABLET | Freq: Every day | ORAL | 6 refills | Status: DC

## 2016-02-07 MED ORDER — METFORMIN HCL 500 MG PO TABS: 500.0000 mg | ORAL_TABLET | Freq: Two times a day (BID) | ORAL | 1 refills | Status: DC

## 2016-02-07 NOTE — Progress Notes (Signed)
Subjective:    Patient ID: Benjamin Hale, male    DOB: September 22, 1965, 50 y.o.   MRN: [id number]  Patient here today for follow up of chronic medical problems.  Outpatient Encounter Prescriptions as of 02/07/2016  Medication Sig  . albuterol (PROVENTIL HFA;VENTOLIN HFA) 108 (90 BASE) MCG/ACT inhaler Inhale 2 puffs into the lungs every 6 (six) hours as needed for wheezing.  Marland Kitchen. aspirin EC 81 MG tablet Take 81 mg by mouth daily.  Marland Kitchen. CINNAMON PO Take 1 capsule by mouth daily.  . fenofibrate 160 MG tablet TAKE ONE (1) TABLET EACH DAY  . fluticasone (FLONASE) 50 MCG/ACT nasal spray Place 2 sprays into the nose daily as needed for rhinitis or allergies.  Marland Kitchen. glucose blood (ONETOUCH VERIO) test strip Use to check BG up to once daily.  DX: type 2 DM E11.9  . hydrochlorothiazide (MICROZIDE) 12.5 MG capsule Take 1 capsule (12.5 mg total) by mouth daily.  Marland Kitchen. ibuprofen (ADVIL,MOTRIN) 200 MG tablet Take 400-800 mg by mouth every 6 (six) hours as needed (For headache or pain.).  Marland Kitchen. lisinopril (PRINIVIL,ZESTRIL) 40 MG tablet Take 1 tablet (40 mg total) by mouth daily.  . metFORMIN (GLUCOPHAGE) 500 MG tablet Take 1 tablet (500 mg total) by mouth 2 (two) times daily with a meal. (Patient taking differently: Take 500 mg by mouth at bedtime. )  . Multiple Vitamin (MULTIVITAMIN WITH MINERALS) TABS tablet Take 1 tablet by mouth daily.  . nitroGLYCERIN (NITROSTAT) 0.4 MG SL tablet Place 1 tablet (0.4 mg total) under the tongue every 5 (five) minutes as needed for chest pain.  Marland Kitchen. omeprazole (PRILOSEC) 20 MG capsule Take 1 capsule (20 mg total) by mouth daily as needed (for acid reflux). Acid reflux.  Letta Pate. ONETOUCH DELICA LANCETS 33G MISC Use to check BG up to once daily  . Probiotic Product (PROBIOTIC PO) Take 1 capsule by mouth daily.      Diabetes  He presents for his follow-up diabetic visit. He has type 2 diabetes mellitus. No MedicAlert identification noted. The initial diagnosis of diabetes was made 4 months ago. His  disease course has been improving. There are no hypoglycemic associated symptoms. Pertinent negatives for diabetes include no chest pain, no foot paresthesias, no polydipsia, no polyphagia, no polyuria and no weight loss. There are no hypoglycemic complications. Symptoms are worsening. There are no diabetic complications. Risk factors for coronary artery disease include dyslipidemia, diabetes mellitus, family history, hypertension and male sex. Current diabetic treatment includes oral agent (monotherapy). He is compliant with treatment most of the time. His weight is stable. He is following a diabetic diet. When asked about meal planning, he reported none. He has not had a previous visit with a dietitian. He rarely participates in exercise. His home blood glucose trend is fluctuating minimally. His breakfast blood glucose is taken between 9-10 am. His breakfast blood glucose range is generally 140-180 mg/dl. His overall blood glucose range is 140-180 mg/dl. (Does not check blood sugars very often) An ACE inhibitor/angiotensin II receptor blocker is being taken. He does not see a podiatrist.Eye exam is not current.  Hypertension  This is a chronic problem. The current episode started more than 1 year ago. The problem has been gradually worsening since onset. The problem is uncontrolled. Pertinent negatives include no chest pain or palpitations. Risk factors for coronary artery disease include dyslipidemia, family history and male gender. Past treatments include ACE inhibitors. The current treatment provides mild improvement. There is no history of a thyroid problem.  Hyperlipidemia  This is a chronic problem. The current episode started more than 1 year ago. The problem is controlled. Recent lipid tests were reviewed and are normal. Pertinent negatives include no chest pain. Treatments tried: Was put on lipitor at last visit but forgot all about it so he has not been taking. The current treatment provides  moderate improvement of lipids. There are no compliance problems.  Risk factors for coronary artery disease include hypertension, dyslipidemia and male sex.  Atrial flutter Started after having Cervical spine surgery. Was cardioverted and is now on cardizem. No symptoms- no compliant of palpitations. Pt had a atrial ablation two months ago. No problems at this time. GERD Omeprazole  Working well for symptoms. Asthma Albuterol HFA- He has not needed to use in over a year. insomnia Sleeps only a couple of hours a night- use to be on [place] but did not like the way it made him feel.  Review of Systems  Constitutional: Negative.  Negative for weight loss.  Cardiovascular: Negative for chest pain and palpitations.  Endocrine: Negative for polydipsia, polyphagia and polyuria.  Neurological: Negative.   Psychiatric/Behavioral: Negative.   All other systems reviewed and are negative.      Objective:   Physical Exam  Constitutional: He is oriented to person, place, and time. He appears well-developed and well-nourished.  HENT:  Head: Normocephalic.  Right Ear: Hearing, tympanic membrane, external ear and ear canal normal.  Left Ear: Hearing, tympanic membrane, external ear and ear canal normal.  Nose: Right sinus exhibits maxillary sinus tenderness. Right sinus exhibits no frontal sinus tenderness. Left sinus exhibits maxillary sinus tenderness. Left sinus exhibits no frontal sinus tenderness.  Mouth/Throat: Posterior oropharyngeal erythema present.  Eyes: EOM are normal. Pupils are equal, round, and reactive to light.  Neck: Normal range of motion. Neck supple. No thyromegaly present.  Cardiovascular: Normal rate, regular rhythm, normal heart sounds and intact distal pulses.   No murmur heard. Pulmonary/Chest: Effort normal and breath sounds normal. He has no wheezes. He has no rales.  Abdominal: Soft. Bowel sounds are normal.  Genitourinary: Prostate normal and penis normal.   Musculoskeletal: Normal range of motion.  Neurological: He is alert and oriented to person, place, and time.  Skin: Skin is warm and dry.  Psychiatric: He has a normal mood and affect. His behavior is normal. Judgment and thought content normal.    BP 118/73   Pulse 64   Temp 97.4 F (36.3 C) (Oral)   Ht 5\' 10"  (1.778 m)   Wt 238 lb (108 kg)   BMI 34.15 kg/m    HGBA1c 6.9% up from 6.1% at last visit      Assessment & Plan:   1. Typical atrial flutter (HCC) Avoid caffeine  2. Essential hypertension Do not add salt to diet - lisinopril (PRINIVIL,ZESTRIL) 40 MG tablet; Take 1 tablet (40 mg total) by mouth daily.  Dispense: 90 tablet; Refill: 3 - hydrochlorothiazide (MICROZIDE) 12.5 MG capsule; Take 1 capsule (12.5 mg total) by mouth daily.  Dispense: 30 capsule; Refill: 5  3. Gastroesophageal reflux disease without esophagitis Avoid spicy foods Do not eat 2 hours prior to bedtime - omeprazole (PRILOSEC) 20 MG capsule; Take 1 capsule (20 mg total) by mouth daily as needed (for acid reflux). Acid reflux.  Dispense: 30 capsule; Refill: 5  4. Controlled type 2 diabetes mellitus with diabetic nephropathy, without long-term current use of insulin (HCC) Stricter carbs counting  5. Hyperlipidemia with target LDL less than 100 Low fat diet -  fenofibrate 160 MG tablet; Take 1 tablet (160 mg total) by mouth daily.  Dispense: 30 tablet; Refill: 6  6. Controlled type 2 diabetes mellitus without complication, without long-term current use of insulin (HCC) Stricter carb counting - metFORMIN (GLUCOPHAGE) 500 MG tablet; Take 1 tablet (500 mg total) by mouth 2 (two) times daily with a meal.  Dispense: 180 tablet; Refill: 1    Labs pending Health maintenance reviewed Diet and exercise encouraged Continue all meds Follow up  In 3 month   Mary-Margaret Daphine Deutscher, [name]

## 2016-02-07 NOTE — Addendum Note (Signed)
Addended by: Bennie PieriniMARTIN, MARY-MARGARET on: 02/07/2016 05:03 PM   Modules accepted: Orders

## 2016-02-07 NOTE — Patient Instructions (Signed)
Diabetes and Foot Care Diabetes may cause you to have problems because of poor blood supply (circulation) to your feet and legs. This may cause the skin on your feet to become thinner, break easier, and heal more slowly. Your skin may become dry, and the skin may peel and crack. You may also have nerve damage in your legs and feet causing decreased feeling in them. You may not notice minor injuries to your feet that could lead to infections or more serious problems. Taking care of your feet is one of the most important things you can do for yourself.  HOME CARE INSTRUCTIONS  Wear shoes at all times, even in the house. Do not go barefoot. Bare feet are easily injured.  Check your feet daily for blisters, cuts, and redness. If you cannot see the bottom of your feet, use a mirror or ask someone for help.  Wash your feet with warm water (do not use hot water) and mild soap. Then pat your feet and the areas between your toes until they are completely dry. Do not soak your feet as this can dry your skin.  Apply a moisturizing lotion or petroleum jelly (that does not contain alcohol and is unscented) to the skin on your feet and to dry, brittle toenails. Do not apply lotion between your toes.  Trim your toenails straight across. Do not dig under them or around the cuticle. File the edges of your nails with an emery board or nail file.  Do not cut corns or calluses or try to remove them with medicine.  Wear clean socks or stockings every day. Make sure they are not too tight. Do not wear knee-high stockings since they may decrease blood flow to your legs.  Wear shoes that fit properly and have enough cushioning. To break in new shoes, wear them for just a few hours a day. This prevents you from injuring your feet. Always look in your shoes before you put them on to be sure there are no objects inside.  Do not cross your legs. This may decrease the blood flow to your feet.  If you find a minor scrape,  cut, or break in the skin on your feet, keep it and the skin around it clean and dry. These areas may be cleansed with mild soap and water. Do not cleanse the area with peroxide, alcohol, or iodine.  When you remove an adhesive bandage, be sure not to damage the skin around it.  If you have a wound, look at it several times a day to make sure it is healing.  Do not use heating pads or hot water bottles. They may burn your skin. If you have lost feeling in your feet or legs, you may not know it is happening until it is too late.  Make sure your health care provider performs a complete foot exam at least annually or more often if you have foot problems. Report any cuts, sores, or bruises to your health care provider immediately. SEEK MEDICAL CARE IF:   You have an injury that is not healing.  You have cuts or breaks in the skin.  You have an ingrown nail.  You notice redness on your legs or feet.  You feel burning or tingling in your legs or feet.  You have pain or cramps in your legs and feet.  Your legs or feet are numb.  Your feet always feel cold. SEEK IMMEDIATE MEDICAL CARE IF:   There is increasing redness,   swelling, or pain in or around a wound.  There is a red line that goes up your leg.  Pus is coming from a wound.  You develop a fever or as directed by your health care provider.  You notice a bad smell coming from an ulcer or wound.   This information is not intended to replace advice given to you by your health care provider. Make sure you discuss any questions you have with your health care provider.   Document Released: 05/30/2000 Document Revised: 02/02/2013 Document Reviewed: 11/09/2012 Elsevier Interactive Patient Education 2016 [employer]  

## 2016-02-08 LAB — LIPID PANEL
Chol/HDL Ratio: 7.6 ratio units — ABNORMAL HIGH (ref 0.0–5.0)
Cholesterol, Total: 122 mg/dL (ref 100–199)
HDL: 16 mg/dL — AB (ref >39)
LDL Calculated: 37 mg/dL (ref 0–99)
TRIGLYCERIDES: 346 mg/dL — AB (ref 0–149)
VLDL CHOLESTEROL CAL: 69 mg/dL — AB (ref 5–40)

## 2016-02-08 LAB — CMP14+EGFR
ALT: 70 IU/L — AB (ref 0–44)
AST: 48 IU/L — ABNORMAL HIGH (ref 0–40)
Albumin/Globulin Ratio: 1.5 (ref 1.2–2.2)
Albumin: 4.1 g/dL (ref 3.5–5.5)
Alkaline Phosphatase: 49 IU/L (ref 39–117)
BILIRUBIN TOTAL: 0.3 mg/dL (ref 0.0–1.2)
BUN/Creatinine Ratio: 21 — ABNORMAL HIGH (ref 9–20)
BUN: 19 mg/dL (ref 6–24)
CALCIUM: 9.1 mg/dL (ref 8.7–10.2)
CHLORIDE: 100 mmol/L (ref 96–106)
CO2: 25 mmol/L (ref 18–29)
Creatinine, Ser: 0.89 mg/dL (ref 0.76–1.27)
GFR, EST AFRICAN AMERICAN: 116 mL/min/{1.73_m2} (ref >59)
GFR, EST NON AFRICAN AMERICAN: 100 mL/min/{1.73_m2} (ref >59)
GLUCOSE: 155 mg/dL — AB (ref 65–99)
Globulin, Total: 2.7 g/dL (ref 1.5–4.5)
Potassium: 4.5 mmol/L (ref 3.5–5.2)
Sodium: 141 mmol/L (ref 134–144)
TOTAL PROTEIN: 6.8 g/dL (ref 6.0–8.5)

## 2016-04-21 ENCOUNTER — Other Ambulatory Visit: Payer: Self-pay | Admitting: Pharmacist

## 2016-05-21 ENCOUNTER — Ambulatory Visit (INDEPENDENT_AMBULATORY_CARE_PROVIDER_SITE_OTHER): Payer: Managed Care, Other (non HMO)

## 2016-05-21 DIAGNOSIS — Z23 Encounter for immunization: Secondary | ICD-10-CM | POA: Diagnosis not present

## 2016-06-18 ENCOUNTER — Ambulatory Visit (INDEPENDENT_AMBULATORY_CARE_PROVIDER_SITE_OTHER): Payer: 59 | Admitting: Nurse Practitioner

## 2016-06-18 ENCOUNTER — Encounter: Payer: Self-pay | Admitting: Nurse Practitioner

## 2016-06-18 VITALS — BP 134/80 | HR 53 | Temp 97.0°F | Ht 70.0 in | Wt 231.0 lb

## 2016-06-18 DIAGNOSIS — E785 Hyperlipidemia, unspecified: Secondary | ICD-10-CM | POA: Diagnosis not present

## 2016-06-18 DIAGNOSIS — I483 Typical atrial flutter: Secondary | ICD-10-CM | POA: Diagnosis not present

## 2016-06-18 DIAGNOSIS — E119 Type 2 diabetes mellitus without complications: Secondary | ICD-10-CM

## 2016-06-18 DIAGNOSIS — I10 Essential (primary) hypertension: Secondary | ICD-10-CM | POA: Diagnosis not present

## 2016-06-18 DIAGNOSIS — K219 Gastro-esophageal reflux disease without esophagitis: Secondary | ICD-10-CM

## 2016-06-18 DIAGNOSIS — J4540 Moderate persistent asthma, uncomplicated: Secondary | ICD-10-CM | POA: Diagnosis not present

## 2016-06-18 LAB — BAYER DCA HB A1C WAIVED: HB A1C (BAYER DCA - WAIVED): 6.6 % (ref <7.0)

## 2016-06-18 MED ORDER — LISINOPRIL 40 MG PO TABS: 40.0000 mg | ORAL_TABLET | Freq: Every day | ORAL | 3 refills | Status: DC

## 2016-06-18 MED ORDER — METFORMIN HCL 500 MG PO TABS: 500.0000 mg | ORAL_TABLET | Freq: Two times a day (BID) | ORAL | 1 refills | Status: DC

## 2016-06-18 MED ORDER — HYDROCHLOROTHIAZIDE 12.5 MG PO CAPS: 12.5000 mg | ORAL_CAPSULE | Freq: Every day | ORAL | 5 refills | Status: DC

## 2016-06-18 MED ORDER — OMEPRAZOLE 20 MG PO CPDR: 20.0000 mg | DELAYED_RELEASE_CAPSULE | Freq: Every day | ORAL | 5 refills | Status: DC | PRN

## 2016-06-18 MED ORDER — FENOFIBRATE 160 MG PO TABS: 160.0000 mg | ORAL_TABLET | Freq: Every day | ORAL | 6 refills | Status: DC

## 2016-06-18 NOTE — Progress Notes (Signed)
Subjective:    Patient ID: Benjamin Hale, male    DOB: 03-31-1966, 51 y.o.   MRN: [id number]  Patient here today for follow up of chronic medical problems. No complaints today.  Outpatient Encounter Prescriptions as of 02/07/2016  Medication Sig  . albuterol (PROVENTIL HFA;VENTOLIN HFA) 108 (90 BASE) MCG/ACT inhaler Inhale 2 puffs into the lungs every 6 (six) hours as needed for wheezing.  Marland Kitchen aspirin EC 81 MG tablet Take 81 mg by mouth daily.  Marland Kitchen CINNAMON PO Take 1 capsule by mouth daily.  . fenofibrate 160 MG tablet TAKE ONE (1) TABLET EACH DAY  . fluticasone (FLONASE) 50 MCG/ACT nasal spray Place 2 sprays into the nose daily as needed for rhinitis or allergies.  Marland Kitchen glucose blood (ONETOUCH VERIO) test strip Use to check BG up to once daily.  DX: type 2 DM E11.9  . hydrochlorothiazide (MICROZIDE) 12.5 MG capsule Take 1 capsule (12.5 mg total) by mouth daily.  Marland Kitchen ibuprofen (ADVIL,MOTRIN) 200 MG tablet Take 400-800 mg by mouth every 6 (six) hours as needed (For headache or pain.).  Marland Kitchen lisinopril (PRINIVIL,ZESTRIL) 40 MG tablet Take 1 tablet (40 mg total) by mouth daily.  . metFORMIN (GLUCOPHAGE) 500 MG tablet Take 1 tablet (500 mg total) by mouth 2 (two) times daily with a meal. (Patient taking differently: Take 500 mg by mouth at bedtime. )  . Multiple Vitamin (MULTIVITAMIN WITH MINERALS) TABS tablet Take 1 tablet by mouth daily.  . nitroGLYCERIN (NITROSTAT) 0.4 MG SL tablet Place 1 tablet (0.4 mg total) under the tongue every 5 (five) minutes as needed for chest pain.  Marland Kitchen omeprazole (PRILOSEC) 20 MG capsule Take 1 capsule (20 mg total) by mouth daily as needed (for acid reflux). Acid reflux.  Glory Rosebush DELICA LANCETS 67M MISC Use to check BG up to once daily  . Probiotic Product (PROBIOTIC PO) Take 1 capsule by mouth daily.      Diabetes  He presents for his follow-up diabetic visit. He has type 2 diabetes mellitus. No MedicAlert identification noted. The initial diagnosis of diabetes was  made 4 months ago. His disease course has been improving. There are no hypoglycemic associated symptoms. Pertinent negatives for diabetes include no chest pain, no foot paresthesias, no polydipsia, no polyphagia, no polyuria and no weight loss. There are no hypoglycemic complications. Symptoms are worsening. There are no diabetic complications. Risk factors for coronary artery disease include dyslipidemia, diabetes mellitus, family history, hypertension and male sex. Current diabetic treatment includes oral agent (monotherapy). He is compliant with treatment most of the time. His weight is stable. He is following a diabetic diet. When asked about meal planning, he reported none. He has not had a previous visit with a dietitian. He rarely participates in exercise. His home blood glucose trend is fluctuating minimally. His breakfast blood glucose is taken between 9-10 am. His breakfast blood glucose range is generally 140-180 mg/dl. His overall blood glucose range is 140-180 mg/dl. (Does not check blood sugars very often) An ACE inhibitor/angiotensin II receptor blocker is being taken. He does not see a podiatrist.Eye exam is not current.  Hypertension  This is a chronic problem. The current episode started more than 1 year ago. The problem has been gradually worsening since onset. The problem is uncontrolled. Pertinent negatives include no chest pain or palpitations. Risk factors for coronary artery disease include dyslipidemia, family history and male gender. Past treatments include ACE inhibitors. The current treatment provides mild improvement. There is no history of a thyroid  problem.  Hyperlipidemia  This is a chronic problem. The current episode started more than 1 year ago. The problem is controlled. Recent lipid tests were reviewed and are normal. Pertinent negatives include no chest pain. Treatments tried: Was put on lipitor at last visit but forgot all about it so he has not been taking. The current  treatment provides moderate improvement of lipids. There are no compliance problems.  Risk factors for coronary artery disease include hypertension, dyslipidemia and male sex.  Atrial flutter Started after having Cervical spine surgery. Was cardioverted and is now on cardizem. No symptoms- no compliant of palpitations. Pt had a atrial ablation two months ago. No problems at this time. GERD Omeprazole  Working well for symptoms. Asthma Albuterol HFA- He has not needed to use in over a year. insomnia Sleeps only a couple of hours a night- use to be on [place] but did not like the way it made him feel.   Review of Systems  Constitutional: Negative.  Negative for weight loss.  Cardiovascular: Negative for chest pain and palpitations.  Endocrine: Negative for polydipsia, polyphagia and polyuria.  Neurological: Negative.   Psychiatric/Behavioral: Negative.   All other systems reviewed and are negative.      Objective:   Physical Exam  Constitutional: He is oriented to person, place, and time. He appears well-developed and well-nourished.  HENT:  Head: Normocephalic.  Right Ear: Hearing, tympanic membrane, external ear and ear canal normal.  Left Ear: Hearing, tympanic membrane, external ear and ear canal normal.  Nose: Right sinus exhibits maxillary sinus tenderness. Right sinus exhibits no frontal sinus tenderness. Left sinus exhibits maxillary sinus tenderness. Left sinus exhibits no frontal sinus tenderness.  Mouth/Throat: Posterior oropharyngeal erythema present.  Eyes: EOM are normal. Pupils are equal, round, and reactive to light.  Neck: Normal range of motion. Neck supple. No thyromegaly present.  Cardiovascular: Normal rate, regular rhythm, normal heart sounds and intact distal pulses.   No murmur heard. Pulmonary/Chest: Effort normal and breath sounds normal. He has no wheezes. He has no rales.  Abdominal: Soft. Bowel sounds are normal.  Genitourinary: Prostate normal and penis  normal.  Musculoskeletal: Normal range of motion.  Neurological: He is alert and oriented to person, place, and time.  Skin: Skin is warm and dry.  Psychiatric: He has a normal mood and affect. His behavior is normal. Judgment and thought content normal.    BP 134/80   Pulse (!) 53   Temp 97 F (36.1 C) (Oral)   Ht '5\' 10"'$  (1.778 m)   Wt 231 lb (104.8 kg)   BMI 33.15 kg/m     HGBA1c 6.6% down from 6.9% at last visit      Assessment & Plan:   1. Hyperlipidemia with target LDL less than 100 Low fat diet - Lipid panel - fenofibrate 160 MG tablet; Take 1 tablet (160 mg total) by mouth daily.  Dispense: 30 tablet; Refill: 6  2. Essential hypertension Low sodium diet - CMP14+EGFR - lisinopril (PRINIVIL,ZESTRIL) 40 MG tablet; Take 1 tablet (40 mg total) by mouth daily.  Dispense: 90 tablet; Refill: 3 - hydrochlorothiazide (MICROZIDE) 12.5 MG capsule; Take 1 capsule (12.5 mg total) by mouth daily.  Dispense: 30 capsule; Refill: 5  3. Controlled type 2 diabetes mellitus without complication, unspecified long term insulin use status (HCC) Continue to watch carbs in diet - Bayer DCA Hb A1c Waived - metFORMIN (GLUCOPHAGE) 500 MG tablet; Take 1 tablet (500 mg total) by mouth 2 (two) times daily  with a meal.  Dispense: 180 tablet; Refill: 1   4. Typical atrial flutter (HCC) Avoid caffeine  5. Gastroesophageal reflux disease without esophagitis Avoid spicy foods Do not eat 2 hours prior to bedtime - omeprazole (PRILOSEC) 20 MG capsule; Take 1 capsule (20 mg total) by mouth daily as needed (for acid reflux). Acid reflux.  Dispense: 30 capsule; Refill: 5  6. Moderate persistent asthma without complication  - metFORMIN (GLUCOPHAGE) 500 MG tablet; Take 1 tablet (500 mg total) by mouth 2 (two) times daily with a meal.  Dispense: 180 tablet; Refill: 1    Labs pending Health maintenance reviewed Diet and exercise encouraged Continue all meds Follow up  In 3 months    [place], [name]

## 2016-06-18 NOTE — Patient Instructions (Signed)

## 2016-06-19 LAB — CMP14+EGFR
ALT: 41 [IU]/L (ref 0–44)
AST: 32 [IU]/L (ref 0–40)
Albumin/Globulin Ratio: 1.4 (ref 1.2–2.2)
Albumin: 4.3 g/dL (ref 3.5–5.5)
Alkaline Phosphatase: 53 [IU]/L (ref 39–117)
BUN/Creatinine Ratio: 16 (ref 9–20)
BUN: 17 mg/dL (ref 6–24)
Bilirubin Total: 0.6 mg/dL (ref 0.0–1.2)
CO2: 22 mmol/L (ref 18–29)
Calcium: 9.6 mg/dL (ref 8.7–10.2)
Chloride: 101 mmol/L (ref 96–106)
Creatinine, Ser: 1.08 mg/dL (ref 0.76–1.27)
GFR calc Af Amer: 92 mL/min/{1.73_m2}
GFR calc non Af Amer: 80 mL/min/{1.73_m2}
Globulin, Total: 3 g/dL (ref 1.5–4.5)
Glucose: 143 mg/dL — ABNORMAL HIGH (ref 65–99)
Potassium: 4.5 mmol/L (ref 3.5–5.2)
Sodium: 139 mmol/L (ref 134–144)
Total Protein: 7.3 g/dL (ref 6.0–8.5)

## 2016-06-19 LAB — LIPID PANEL
CHOL/HDL RATIO: 5.7 ratio — AB (ref 0.0–5.0)
Cholesterol, Total: 120 mg/dL (ref 100–199)
HDL: 21 mg/dL — ABNORMAL LOW (ref >39)
LDL CALC: 23 mg/dL (ref 0–99)
Triglycerides: 379 mg/dL — ABNORMAL HIGH (ref 0–149)
VLDL CHOLESTEROL CAL: 76 mg/dL — AB (ref 5–40)

## 2016-09-11 ENCOUNTER — Ambulatory Visit (INDEPENDENT_AMBULATORY_CARE_PROVIDER_SITE_OTHER): Payer: 59 | Admitting: Nurse Practitioner

## 2016-09-11 ENCOUNTER — Encounter: Payer: Self-pay | Admitting: Nurse Practitioner

## 2016-09-11 VITALS — BP 126/76 | HR 65 | Temp 97.6°F | Ht 70.0 in | Wt 230.0 lb

## 2016-09-11 DIAGNOSIS — I483 Typical atrial flutter: Secondary | ICD-10-CM

## 2016-09-11 DIAGNOSIS — I10 Essential (primary) hypertension: Secondary | ICD-10-CM

## 2016-09-11 DIAGNOSIS — E119 Type 2 diabetes mellitus without complications: Secondary | ICD-10-CM

## 2016-09-11 DIAGNOSIS — E785 Hyperlipidemia, unspecified: Secondary | ICD-10-CM | POA: Diagnosis not present

## 2016-09-11 DIAGNOSIS — K219 Gastro-esophageal reflux disease without esophagitis: Secondary | ICD-10-CM

## 2016-09-11 NOTE — Progress Notes (Addendum)
Subjective:    Patient ID: Benjamin Hale, male    DOB: 10-29-1965, 51 y.o.   MRN: [id number]  Patient here today for follow up of chronic medical problems. No complaints today.  Outpatient Encounter Prescriptions as of 02/07/2016  Medication Sig  . albuterol (PROVENTIL HFA;VENTOLIN HFA) 108 (90 BASE) MCG/ACT inhaler Inhale 2 puffs into the lungs every 6 (six) hours as needed for wheezing.  Marland Kitchen aspirin EC 81 MG tablet Take 81 mg by mouth daily.  Marland Kitchen CINNAMON PO Take 1 capsule by mouth daily.  . fenofibrate 160 MG tablet TAKE ONE (1) TABLET EACH DAY  . fluticasone (FLONASE) 50 MCG/ACT nasal spray Place 2 sprays into the nose daily as needed for rhinitis or allergies.  Marland Kitchen glucose blood (ONETOUCH VERIO) test strip Use to check BG up to once daily.  DX: type 2 DM E11.9  . hydrochlorothiazide (MICROZIDE) 12.5 MG capsule Take 1 capsule (12.5 mg total) by mouth daily.  Marland Kitchen ibuprofen (ADVIL,MOTRIN) 200 MG tablet Take 400-800 mg by mouth every 6 (six) hours as needed (For headache or pain.).  Marland Kitchen lisinopril (PRINIVIL,ZESTRIL) 40 MG tablet Take 1 tablet (40 mg total) by mouth daily.  . metFORMIN (GLUCOPHAGE) 500 MG tablet Take 1 tablet (500 mg total) by mouth 2 (two) times daily with a meal. (Patient taking differently: Take 500 mg by mouth at bedtime. )  . Multiple Vitamin (MULTIVITAMIN WITH MINERALS) TABS tablet Take 1 tablet by mouth daily.  . nitroGLYCERIN (NITROSTAT) 0.4 MG SL tablet Place 1 tablet (0.4 mg total) under the tongue every 5 (five) minutes as needed for chest pain.  Marland Kitchen omeprazole (PRILOSEC) 20 MG capsule Take 1 capsule (20 mg total) by mouth daily as needed (for acid reflux). Acid reflux.  Glory Rosebush DELICA LANCETS 71I MISC Use to check BG up to once daily  . Probiotic Product (PROBIOTIC PO) Take 1 capsule by mouth daily.      Hyperlipidemia  This is a chronic problem. The current episode started more than 1 year ago. The problem is controlled. Recent lipid tests were reviewed and are  normal. Pertinent negatives include no chest pain. Treatments tried: Was put on lipitor at last visit but forgot all about it so he has not been taking. The current treatment provides moderate improvement of lipids. There are no compliance problems.  Risk factors for coronary artery disease include hypertension, dyslipidemia and male sex.  Diabetes  He presents for his follow-up diabetic visit. He has type 2 diabetes mellitus. No MedicAlert identification noted. The initial diagnosis of diabetes was made 4 months ago. His disease course has been improving. There are no hypoglycemic associated symptoms. Pertinent negatives for diabetes include no chest pain, no foot paresthesias, no polydipsia, no polyphagia, no polyuria and no weight loss. There are no hypoglycemic complications. Symptoms are worsening. There are no diabetic complications. Risk factors for coronary artery disease include dyslipidemia, diabetes mellitus, family history, hypertension and male sex. Current diabetic treatment includes oral agent (monotherapy). He is compliant with treatment most of the time. His weight is stable. He is following a diabetic diet. When asked about meal planning, he reported none. He has not had a previous visit with a dietitian. He rarely participates in exercise. His home blood glucose trend is fluctuating minimally. His breakfast blood glucose is taken between 9-10 am. His breakfast blood glucose range is generally 140-180 mg/dl. His overall blood glucose range is 140-180 mg/dl. (Does not check blood sugars very often) An ACE inhibitor/angiotensin II receptor blocker  is being taken. He does not see a podiatrist.Eye exam is not current.  Hypertension  This is a chronic problem. The current episode started more than 1 year ago. The problem has been gradually worsening since onset. The problem is uncontrolled. Pertinent negatives include no chest pain or palpitations. Risk factors for coronary artery disease include  dyslipidemia, family history and male gender. Past treatments include ACE inhibitors. The current treatment provides mild improvement. There is no history of a thyroid problem.  Atrial flutter Started after having Cervical spine surgery. Was cardioverted and is now on cardizem. No symptoms- no compliant of palpitations. Pt had a atrial ablation six months ago.* Has been having episodes of attrial flutter for the last month. Says episodes will start with painin jaw and then he will break out in a sweat and feels like his heart beats fast then stops and starts again- Usually occurs when he is hot.  GERD Omeprazole  Working well for symptoms. Asthma Albuterol HFA- He has not needed to use in over a year. insomnia Sleeps only a couple of hours a night- use to be on [place] but did not like the way it made him feel.   Review of Systems  Constitutional: Negative.  Negative for weight loss.  Cardiovascular: Negative for chest pain and palpitations.  Endocrine: Negative for polydipsia, polyphagia and polyuria.  Neurological: Negative.   Psychiatric/Behavioral: Negative.   All other systems reviewed and are negative.      Objective:   Physical Exam  Constitutional: He is oriented to person, place, and time. He appears well-developed and well-nourished.  HENT:  Head: Normocephalic.  Right Ear: Hearing, tympanic membrane, external ear and ear canal normal.  Left Ear: Hearing, tympanic membrane, external ear and ear canal normal.  Nose: Right sinus exhibits maxillary sinus tenderness. Right sinus exhibits no frontal sinus tenderness. Left sinus exhibits maxillary sinus tenderness. Left sinus exhibits no frontal sinus tenderness.  Mouth/Throat: Posterior oropharyngeal erythema present.  Eyes: EOM are normal. Pupils are equal, round, and reactive to light.  Neck: Normal range of motion. Neck supple. No thyromegaly present.  Cardiovascular: Normal rate, regular rhythm, normal heart sounds and intact  distal pulses.   No murmur heard. Pulmonary/Chest: Effort normal and breath sounds normal. He has no wheezes. He has no rales.  Abdominal: Soft. Bowel sounds are normal.  Genitourinary: Prostate normal and penis normal.  Musculoskeletal: Normal range of motion.  Neurological: He is alert and oriented to person, place, and time.  Skin: Skin is warm and dry.  Psychiatric: He has a normal mood and affect. His behavior is normal. Judgment and thought content normal.    BP 126/76 (BP Location: Left Arm)   Pulse 65   Temp 97.6 F (36.4 C) (Oral)   Ht '5\' 10"'$  (1.778 m)   Wt 230 lb (104.3 kg)   BMI 33.00 kg/m     HGBA1c 6.6% down from 6.9% at last visit      Assessment & Plan:  1. Hyperlipidemia with target LDL less than 100 Continue low fta diet  2. Controlled type 2 diabetes mellitus without complication, unspecified long term insulin use status ([place]) Continue to watch carbsin diet  3. Essential hypertension Low sodium  4. Gastroesophageal reflux disease without esophagitis Avoid spicy foods Do not eat 2 hours prior to bedtime  5. Typical atrial flutter (HCC) Keep diary of episodes Let cardiology know about cramps - CMP14+EGFR - Ambulatory referral to Cardiology    Labs pending Health maintenance reviewed Diet  and exercise encouraged Continue all meds Follow up  In 3 months   [place], [name]

## 2016-09-11 NOTE — Patient Instructions (Signed)
Atrial Flutter  Atrial flutter is a type of abnormal heart rhythm (arrhythmia). In atrial flutter, the heartbeat is fast but regular. There are two types of atrial flutter:  · Paroxysmal atrial flutter. This type starts suddenly. It usually stops on its own soon after it starts.  · Permanent atrial flutter. This type does not go away.    What are the causes?  This condition may be caused by:  · A heart condition or problem, such as:  ? A heart attack.  ? Heart failure.  ? A heart valve problem.  · A lung problem, such as:  ? A blood clot in the lungs (pulmonary embolism, or PE).  ? Chronic obstructive pulmonary disease.  · Poorly controlled high blood pressure (hypertension).  · Hyperthyroidism.  · Caffeine.  · Some decongestant cold medicines.  · Low levels of minerals called electrolytes in the blood.  · Cocaine.    What increases the risk?  This condition is more likely to develop in:  · Elderly adults.  · Men.    What are the signs or symptoms?  Symptoms of this condition include:  · A feeling that your heart is pounding or racing (palpitations).  · Shortness of breath.  · Chest pain.  · Feeling light-headed.  · Dizziness.  · Fainting.    How is this diagnosed?  This condition may be diagnosed with tests, including:  · An electrocardiogram (ECG). This is a painless test that records electrical signals in the heart.  · Holter monitoring. For this test, you wear a device that records your heartbeat for 1-2 days.  · Cardiac event monitoring. For this test, you wear a device that records your heartbeat for up to 30 days.  · An echocardiogram. This is a painless test that uses sound waves to make a picture of your heart.  · Stress test. This test records your heartbeat while you exercise.  · Blood tests.    How is this treated?  This condition may be treated with:  · Treatment of any underlying conditions.  · Medicine to make your heart beat more slowly.  · Medicine to keep the condition from coming back.  · A  procedure to keep the condition under control. Some procedures to do this include:  ? Cardioversion. During this procedure, medicines or an electrical shock are given to make the heart beat normally.  ? Ablation. During this procedure, the heart tissue that is causing the problem is destroyed. This procedure may be done if atrial flutter lasts a long time or happens often.    Follow these instructions at home:  · Take over-the-counter and prescription medicines only as told by your health care provider.  · Do not take any new medicines without talking to your health care provider.  · Do not use tobacco products, including cigarettes, chewing tobacco, or e-cigarettes. If you need help quitting, ask your health care provider.  · Limit alcohol intake to no more than 1 drink per day for nonpregnant women and 2 drinks per day for men. One drink equals 12 oz of beer, 5 oz of wine, or 1½ oz of hard liquor.  · Try to reduce any stress. Stress can make your symptoms worse.  Contact a health care provider if:  · Your symptoms get worse.  Get help right away if:  · You are dizzy.  · You feel like fainting or you faint.  · You have shortness of breath.  · You feel pain   or pressure in your chest.  · You suddenly feel nauseous or you suddenly vomit.  · There is a sudden change in your ability to speak, eat, or move.  · You are sweating a lot for no reason.  This information is not intended to replace advice given to you by your health care provider. Make sure you discuss any questions you have with your health care provider.  Document Released: 10/19/2008 Document Revised: 10/10/2015 Document Reviewed: 12/15/2014  [employer] Interactive Patient Education © 2017 [employer]

## 2016-09-12 LAB — CMP14+EGFR
ALBUMIN: 4.5 g/dL (ref 3.5–5.5)
ALK PHOS: 59 IU/L (ref 39–117)
ALT: 42 IU/L (ref 0–44)
AST: 31 IU/L (ref 0–40)
Albumin/Globulin Ratio: 1.6 (ref 1.2–2.2)
BILIRUBIN TOTAL: 0.6 mg/dL (ref 0.0–1.2)
BUN / CREAT RATIO: 15 (ref 9–20)
BUN: 15 mg/dL (ref 6–24)
CHLORIDE: 100 mmol/L (ref 96–106)
CO2: 26 mmol/L (ref 18–29)
Calcium: 9.5 mg/dL (ref 8.7–10.2)
Creatinine, Ser: 0.97 mg/dL (ref 0.76–1.27)
GFR calc Af Amer: 105 mL/min/{1.73_m2} (ref >59)
GFR calc non Af Amer: 91 mL/min/{1.73_m2} (ref >59)
GLOBULIN, TOTAL: 2.8 g/dL (ref 1.5–4.5)
Glucose: 155 mg/dL — ABNORMAL HIGH (ref 65–99)
POTASSIUM: 3.9 mmol/L (ref 3.5–5.2)
SODIUM: 141 mmol/L (ref 134–144)
Total Protein: 7.3 g/dL (ref 6.0–8.5)

## 2016-09-26 ENCOUNTER — Encounter: Payer: Self-pay | Admitting: Physician Assistant

## 2016-09-26 ENCOUNTER — Ambulatory Visit (INDEPENDENT_AMBULATORY_CARE_PROVIDER_SITE_OTHER): Payer: No Typology Code available for payment source | Admitting: Physician Assistant

## 2016-09-26 VITALS — BP 130/81 | HR 64 | Ht 70.0 in | Wt 230.2 lb

## 2016-09-26 DIAGNOSIS — E119 Type 2 diabetes mellitus without complications: Secondary | ICD-10-CM | POA: Diagnosis not present

## 2016-09-26 DIAGNOSIS — Z79899 Other long term (current) drug therapy: Secondary | ICD-10-CM | POA: Diagnosis not present

## 2016-09-26 DIAGNOSIS — I483 Typical atrial flutter: Secondary | ICD-10-CM

## 2016-09-26 DIAGNOSIS — I10 Essential (primary) hypertension: Secondary | ICD-10-CM

## 2016-09-26 DIAGNOSIS — E785 Hyperlipidemia, unspecified: Secondary | ICD-10-CM

## 2016-09-26 MED ORDER — PRAVASTATIN SODIUM 40 MG PO TABS: 40.0000 mg | ORAL_TABLET | Freq: Every day | ORAL | 5 refills | Status: DC

## 2016-09-26 MED ORDER — DILTIAZEM HCL ER COATED BEADS 120 MG PO CP24: 120.0000 mg | ORAL_CAPSULE | Freq: Every day | ORAL | 5 refills | Status: DC

## 2016-09-26 NOTE — Progress Notes (Signed)
Cardiology Office Note    Date:  09/27/2016   ID:  Benjamin Hale, DOB [birth year], MRN [id number]  PCP:  [name], [name]  Cardiologist:  Dr. Jens Som  Chief Complaint  Patient presents with  . Follow-up    1 year; seen for Dr. Jens Som.   . Palpitations    History of Present Illness:  Benjamin Hale is a 51 y.o. male with PMH of atrial flutter in 2014 s/p RFA, NIDDM, HTN andd HLD. He is a former smoker. He was seen by Dr. Jens Som on 10/17/2015 and complained of chest pain and dyspnea on exertion worrisome for angina. Echocardiogram obtained on 11/06/2015 showed EF 55-60%, no regional wall motion abnormality. Myoview obtained on 11/06/2015 showed EF 57%, small defect of mild severity present in the basal inferior location consistent with prior inferior wall myocardial infarction with peri-infarct ischemia, overall considered low risk study. Cardiac catheterization performed on 11/16/2015 showed angiographically normal coronary arteries with a right dominant system. Likely false positive stress test.  He was also follow-up last year after cardiac catheterization, however he did not follow up until today. Recent laboratory work shows his cholesterol is largely uncontrolled. Lipid panel obtained on 06/18/2016 showed cholesterol 120, HDL 21, LDL 23, triglyceride 379. He is on fenofibrate. He had previous myalgia associated with Lipitor, however he is agreeable to try Pravachol 40 mg daily. He will need a repeat fasting lipid panel and LFTs in 6-8 weeks. Initially, I wanted to refer him to the lipid clinic, however his wife says he is very unlikely to follow-up with lipid clinic. Furthermore I discussed with him about his extremely low HDL level of 21, his wife does mention that his work does not allow him to have much free time. However they're trying to get more exercise. Since late last year, he has been having recurrent palpitation. He says this is the same feeling when he had atrial  flutter in the past. However he has not experienced atrial flutter since his ablation procedure back in 2014 until late last year. His wife also mentions, whenever he has recurrent palpitation, he appears to be very pale, she worries he might pass out. So far each episodes usually last from 8-10 minutes. He is not on any systemic anticoagulation since the ablation procedure. We discussed various options, I plan to obtain a 30 day event monitor. It sounds like he is having symptoms 3 times a week. I did tell him to hold off on diltiazem CD 120 mg daily for the first 15 days of the event monitor, he can start taking the medication for the last 15 days of the event monitor. If he does have recurrent atrial flutter, he will need to start on a NOAC as his CHA2DS2-Vasc score is 2 (HTN, DM II). Initially, we will try to rate control manage him, however if he continued to have breakthrough atrial flutter despite diltiazem, I think he would be a good candidate for multaq flecainide, given negative cath last year.   Past Medical History:  Diagnosis Date  . Anginal pain (HCC)   . Arthritis    "neck" (12/23/2012)  . Asthma   . Atrial flutter (HCC)    a. s/p cervical spine surgery 4/14 => s/p DCCV;  b.  Echo 09/2012:  Mild LVH, EF 60-65%, Gr 2 DD.c. s/p atrial flutter ablation 12/23/2012 by Dr Johney Frame  . Cervical disc disease    s/p cervical spine surgery 4/14  . COPD (chronic obstructive pulmonary disease) (HCC)   .  Diabetes mellitus without complication (HCC)   . Exertional shortness of breath   . GERD (gastroesophageal reflux disease)   . Headache    "weekly" (12/23/2012)  . HTN (hypertension)   . Hyperlipidemia   . Hypogonadism male   . Migraines    "once q 3-4 months" (12/23/2012)    Past Surgical History:  Procedure Laterality Date  . ANTERIOR CERVICAL DECOMP/DISCECTOMY FUSION N/A 09/22/2012   Procedure: ANTERIOR CERVICAL DECOMPRESSION/DISCECTOMY FUSION 2 LEVELS;  Surgeon: Karn Cassis, MD;   Location: [facility];  Service: Neurosurgery;  Laterality: N/A;  Cervical five-six Cervical six-seven Anterior cervical decompression/diskectomy/fusion  . ATRIAL FLUTTER ABLATION  12/23/2012   CTI ablation by Dr Johney Frame  . ATRIAL FLUTTER ABLATION N/A 12/23/2012   Procedure: ATRIAL FLUTTER ABLATION;  Surgeon: Hillis Range, MD;  Location: [facility] CATH LAB;  Service: Cardiovascular;  Laterality: N/A;  . CARDIAC CATHETERIZATION N/A 11/16/2015   Procedure: Left Heart Cath and Coronary Angiography;  Surgeon: Marykay Lex, MD;  Location: [facility] INVASIVE CV LAB;  Service: Cardiovascular;  Laterality: N/A;  . CARDIOVERSION N/A 09/23/2012   Procedure: CARDIOVERSION;  Surgeon: Gaylord Shih, MD;  Location: [facility] OR;  Service: Cardiovascular;  Laterality: N/A;  . TONSILLECTOMY  1973  . TREATMENT FISTULA ANAL  ~ 2007  . WISDOM TOOTH EXTRACTION      Current Medications: Outpatient Medications Prior to Visit  Medication Sig Dispense Refill  . albuterol (PROVENTIL HFA;VENTOLIN HFA) 108 (90 BASE) MCG/ACT inhaler Inhale 2 puffs into the lungs every 6 (six) hours as needed for wheezing.    Marland Kitchen aspirin EC 81 MG tablet Take 81 mg by mouth daily.    Marland Kitchen CINNAMON PO Take 1 capsule by mouth daily.    . fenofibrate 160 MG tablet Take 1 tablet (160 mg total) by mouth daily. 30 tablet 6  . fluticasone (FLONASE) 50 MCG/ACT nasal spray Place 2 sprays into the nose daily as needed for rhinitis or allergies.    . hydrochlorothiazide (MICROZIDE) 12.5 MG capsule Take 1 capsule (12.5 mg total) by mouth daily. 30 capsule 5  . ibuprofen (ADVIL,MOTRIN) 200 MG tablet Take 400-800 mg by mouth every 6 (six) hours as needed (For headache or pain.).    Marland Kitchen lisinopril (PRINIVIL,ZESTRIL) 40 MG tablet Take 1 tablet (40 mg total) by mouth daily. 90 tablet 3  . metFORMIN (GLUCOPHAGE) 500 MG tablet Take 1 tablet (500 mg total) by mouth 2 (two) times daily with a meal. 180 tablet 1  . Multiple Vitamin (MULTIVITAMIN WITH MINERALS) TABS tablet Take 1 tablet  by mouth daily.    . nitroGLYCERIN (NITROSTAT) 0.4 MG SL tablet Place 1 tablet (0.4 mg total) under the tongue every 5 (five) minutes as needed for chest pain. 25 tablet 3  . omeprazole (PRILOSEC) 20 MG capsule Take 1 capsule (20 mg total) by mouth daily as needed (for acid reflux). Acid reflux. 30 capsule 5  . ONETOUCH DELICA LANCETS 33G MISC EVERY DAY 100 each 11  . ONETOUCH VERIO test strip TWICE DAILY 100 each 11  . Probiotic Product (PROBIOTIC PO) Take 1 capsule by mouth daily.     No facility-administered medications prior to visit.      Allergies:   Patient has no known allergies.   Social History   Social History  . Marital status: Married    Spouse name: N/A  . Number of children: N/A  . Years of education: N/A   Social History Main Topics  . Smoking status: Former Smoker  Packs/day: 1.50    Years: 22.00    Types: Cigarettes    Quit date: 06/16/2005  . Smokeless tobacco: Never Used  . Alcohol use Yes     Comment: 12/23/2012 previously drank heavily - quit that 8 yrs ago; might have a beer q 5 yr"  . Drug use: No  . Sexual activity: Yes   Other Topics Concern  . None   Social History Narrative   Lives in [place] with his wife and 5 yr old son.  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     Family History:  The patient's family history includes Brain cancer in his father; Diabetes in his maternal grandfather and maternal uncle; Lung cancer in his mother.   ROS:   Please see the history of present illness.    ROS All other systems reviewed and are negative.   PHYSICAL EXAM:   VS:  BP 130/81   Pulse 64   Ht  (1.778 m)   Wt 230 lb 3.2 oz (104.4 kg)   BMI 33.03 kg/m    GEN: Well nourished, well developed, in no acute distress  HEENT: normal  Neck: no JVD, carotid bruits, or masses Cardiac: RRR; no murmurs, rubs, or gallops,no edema  Respiratory:  clear to auscultation bilaterally, normal work of breathing GI: soft, nontender,  nondistended, + BS MS: no deformity or atrophy  Skin: warm and dry, no rash Neuro:  Alert and Oriented x 3, Strength and sensation are intact Psych: euthymic mood, full affect  Wt Readings from Last 3 Encounters:  09/26/16 230 lb 3.2 oz (104.4 kg)  09/11/16 230 lb (104.3 kg)  06/18/16 231 lb (104.8 kg)      Studies/Labs Reviewed:   EKG:  EKG is ordered today.  The ekg ordered today demonstrates Normal sinus rhythm, nonspecific T-wave abnormalities.  Recent Labs: 11/13/2015: Hemoglobin 15.1; Platelets 232; TSH 0.99 09/11/2016: ALT 42; BUN 15; Creatinine, Ser 0.97; Potassium 3.9; Sodium 141   Lipid Panel    Component Value Date/Time   CHOL 120 06/18/2016 1158   CHOL 110 11/10/2012 0935   TRIG 379 (H) 06/18/2016 1158   TRIG 397 (H) 12/22/2014 0839   TRIG 251 (H) 11/10/2012 0935   HDL 21 (L) 06/18/2016 1158   HDL 16 (L) 12/22/2014 0839   HDL 22 (L) 11/10/2012 0935   CHOLHDL 5.7 (H) 06/18/2016 1158   LDLCALC 23 06/18/2016 1158   LDLCALC 37 03/27/2014 0849   LDLCALC 38 11/10/2012 0935    Additional studies/ records that were reviewed today include:   Echo 11/06/2015 LV EF: 55% -   60%  - Left ventricle: The cavity size was normal. Systolic function was   normal. The estimated ejection fraction was in the range of 55%   to 60%. Wall motion was normal; there were no regional wall   motion abnormalities. Left ventricular diastolic function   parameters were normal.   Myoview 11/06/2015 Study Highlights    Nuclear stress EF: 57%.  The left ventricular ejection fraction is normal (55-65%).  There was no ST segment deviation noted during stress.  Defect 1: There is a small defect of mild severity present in the basal inferior location.  Findings consistent with prior Inferior wall myocardial infarction with peri-infarct ischemia.  This is a low risk study.    Cath 11/16/2015 Conclusion    Angiographically normal coronary arteries with a right dominant  system.  The left ventricular systolic function is normal.   No  culprit lesion found to explain the patient's symptoms. Would suspect false-positive stress test.  Plan: Return to short stay for post catheter removal. Expected discharge later on today.  Follow-up with Dr. Jens Som.      ASSESSMENT:    1. Typical atrial flutter (HCC)   2. Medication management   3. Controlled type 2 diabetes mellitus without complication, without long-term current use of insulin (HCC)   4. Essential hypertension   5. Hyperlipidemia, unspecified hyperlipidemia type      PLAN:  In order of problems listed above:  1. Paroxysmal atrial flutter: CHA2DS2-Vasc score 2 (HTN, DM II). He is currently not on NOAC nor is he taking ASA. He has not had any recurrent paroxysmal atrial flutter since his radiofrequency ablation in 2014. Since October 2017, he has been noticing intermittent palpitation episode last 8-10 minutes each. Now it is occurring at least 3 times a week. He his wife mentions whenever he has to symptom, he appears to be very pale. She is worried he might pass out. I will start him on a 30 day monitor, he likely will have captured event by the end of the first week. I did ask him to hold off on diltiazem for the first 15 days of the event monitor, however he can start on the diltiazem for the next 15 days of the event monitor. This will also allow [place] to see the effectiveness of diltiazem on controlling his palpitation. I did ask him to manually trigger events in case he has any recurrent palpitation while wearing the event monitor. If it does show that he has recurrent atrial flutter, we will start on NOAC. If he continued to have breaks through atrial flutter despite diltiazem, he would actually be a very good candidate for multaq or flecainide given negative cath in 2017  2. Hypertension: Blood pressure well controlled. If need more blood pressure room later for rate control, we can discontinue  hydrochlorothiazide or cut back on lisinopril.  3. Hypertriglyceridemia: HDL was extremely low at 21. His wife mentioned that he is not exercising at all. They recently moved to a new location, they were trying to do more exercise. He has previous reaction with Lipitor. However he is willing to give Pravachol a try. We can also consider adding fish oil or even Vascepa at some point. He is currently on fenofibrate.  4. DM 2: Managed by primary care physician, on metformin.    Medication Adjustments/Labs and Tests Ordered: Current medicines are reviewed at length with the patient today.  Concerns regarding medicines are outlined above.  Medication changes, Labs and Tests ordered today are listed in the Patient Instructions below. Patient Instructions  Medication Instructions:  RE-START DILTIAZEM CD 120 MG DAILY START PRAVASTATIN  DAILY  If you need a refill on your cardiac medications before your next appointment, please call your pharmacy.  Testing/Procedures: Your physician has recommended that you wear an event monitor. Event monitors are medical devices that record the heart's electrical activity. Doctors most often [place] these monitors to diagnose arrhythmias. Arrhythmias are problems with the speed or rhythm of the heartbeat. The monitor is a small, portable device. You can wear one while you do your normal daily activities. This is usually used to diagnose what is causing palpitations/syncope (passing out).   Follow-Up: Your physician wants you to follow-up in: 2 MONTHS WITH Namon Villarin, PA-C, ON A DAY WHEN [facility] IS HERE.   Special Instructions: PLEASE WEAR MONITOR 15 DAYS WITHOUT MEDICATION (DILTIAZEM 120 CD) AND  THEN 15 DAYS WITH MEDICATION(DILTIAZEM 120 CD).    Thank you for choosing [facility] at [facility]!!    Collen Hostler, PA-C [place], LPN     Ramond Dial, [place]  09/27/2016 11:59 AM    [facility] [address] [place], [place], [place]   [address] Phone: [phone]; Fax: [phone]

## 2016-09-26 NOTE — Patient Instructions (Signed)
Medication Instructions:  RE-START DILTIAZEM CD 120 MG DAILY START PRAVASTATIN  DAILY  If you need a refill on your cardiac medications before your next appointment, please call your pharmacy.  Testing/Procedures: Your physician has recommended that you wear an event monitor. Event monitors are medical devices that record the heart's electrical activity. Doctors most often [place] these monitors to diagnose arrhythmias. Arrhythmias are problems with the speed or rhythm of the heartbeat. The monitor is a small, portable device. You can wear one while you do your normal daily activities. This is usually used to diagnose what is causing palpitations/syncope (passing out).   Follow-Up: Your physician wants you to follow-up in: 2 MONTHS WITH HAO MENG, PA-C, ON A DAY WHEN [facility] IS HERE.   Special Instructions: PLEASE WEAR MONITOR 15 DAYS WITHOUT MEDICATION (DILTIAZEM 120 CD) AND THEN 15 DAYS WITH MEDICATION(DILTIAZEM 120 CD).    Thank you for choosing [facility] at [place]!!    HAO MENG, PA-C [place], LPN

## 2016-09-27 ENCOUNTER — Encounter: Payer: Self-pay | Admitting: Physician Assistant

## 2016-10-06 ENCOUNTER — Ambulatory Visit (INDEPENDENT_AMBULATORY_CARE_PROVIDER_SITE_OTHER): Payer: No Typology Code available for payment source

## 2016-10-06 DIAGNOSIS — I483 Typical atrial flutter: Secondary | ICD-10-CM | POA: Diagnosis not present

## 2016-11-06 ENCOUNTER — Telehealth: Payer: Self-pay | Admitting: Physician Assistant

## 2016-11-06 NOTE — Telephone Encounter (Signed)
Patient calling, states that he did not see an order for his lab work in [employer]. Patient would like to have lab work completed tomorrow, please call to follow up,. Thanks.

## 2016-11-06 NOTE — Telephone Encounter (Signed)
Patient made aware that the labs have been ordered to [facility]. Informed that if he has a problem to call the clinic back and we can fax the orders if needed. He verbalized his understanding.

## 2016-11-07 LAB — HEPATIC FUNCTION PANEL
ALT: 40 U/L (ref 9–46)
AST: 31 U/L (ref 10–35)
Albumin: 4.1 g/dL (ref 3.6–5.1)
Alkaline Phosphatase: 50 U/L (ref 40–115)
BILIRUBIN DIRECT: 0.2 mg/dL (ref <=0.2)
BILIRUBIN TOTAL: 0.7 mg/dL (ref 0.2–1.2)
Indirect Bilirubin: 0.5 mg/dL (ref 0.2–1.2)
Total Protein: 6.7 g/dL (ref 6.1–8.1)

## 2016-11-07 LAB — LIPID PANEL
CHOL/HDL RATIO: 6.5 ratio — AB (ref <5.0)
CHOLESTEROL: 104 mg/dL (ref <200)
HDL: 16 mg/dL — ABNORMAL LOW (ref >40)
LDL Cholesterol: 32 mg/dL (ref <100)
Triglycerides: 279 mg/dL — ABNORMAL HIGH (ref <150)
VLDL: 56 mg/dL — AB (ref <30)

## 2016-11-12 ENCOUNTER — Encounter: Payer: Self-pay | Admitting: Physician Assistant

## 2016-11-12 ENCOUNTER — Ambulatory Visit (INDEPENDENT_AMBULATORY_CARE_PROVIDER_SITE_OTHER): Payer: No Typology Code available for payment source | Admitting: Physician Assistant

## 2016-11-12 VITALS — BP 122/80 | HR 67 | Ht 70.0 in | Wt 234.0 lb

## 2016-11-12 DIAGNOSIS — E119 Type 2 diabetes mellitus without complications: Secondary | ICD-10-CM | POA: Diagnosis not present

## 2016-11-12 DIAGNOSIS — I4892 Unspecified atrial flutter: Secondary | ICD-10-CM

## 2016-11-12 DIAGNOSIS — I10 Essential (primary) hypertension: Secondary | ICD-10-CM | POA: Diagnosis not present

## 2016-11-12 DIAGNOSIS — R42 Dizziness and giddiness: Secondary | ICD-10-CM

## 2016-11-12 DIAGNOSIS — R002 Palpitations: Secondary | ICD-10-CM

## 2016-11-12 DIAGNOSIS — I491 Atrial premature depolarization: Secondary | ICD-10-CM | POA: Diagnosis not present

## 2016-11-12 DIAGNOSIS — E785 Hyperlipidemia, unspecified: Secondary | ICD-10-CM

## 2016-11-12 MED ORDER — OMEGA-3-ACID ETHYL ESTERS 1 G PO CAPS: 1.0000 g | ORAL_CAPSULE | Freq: Two times a day (BID) | ORAL | 1 refills | Status: DC

## 2016-11-12 MED ORDER — METOPROLOL TARTRATE 25 MG PO TABS: 12.5000 mg | ORAL_TABLET | Freq: Two times a day (BID) | ORAL | 1 refills | Status: DC

## 2016-11-12 NOTE — Progress Notes (Signed)
Cardiology Office Note    Date:  11/13/2016   ID:  Benjamin Nimroderrance D Grabe, DOB 01/19/1966, MRN [id number]  PCP:  Bennie PieriniMartin, Mary-Margaret, [name]  Cardiologist:  Dr. [name]  Chief Complaint  Patient presents with  . Follow-up    Heart Monitor. Seen for Dr. [name]    History of Present Illness:  Benjamin Hale is a 51 y.o. male with PMH of atrial flutter in 2014 s/p RFA, NIDDM, HTN and HLD. He is a former smoker. He was seen by Dr. [name] on 10/17/2015 and complained of chest pain and dyspnea on exertion worrisome for angina. Echocardiogram obtained on 11/06/2015 showed EF 55-60%, no regional wall motion abnormality. Myoview obtained on 11/06/2015 showed EF 57%, small defect of mild severity present in the basal inferior location consistent with prior inferior wall myocardial infarction with peri-infarct ischemia, overall considered low risk study. Cardiac catheterization performed on 11/16/2015 showed angiographically normal coronary arteries with a right dominant system. Likely false positive stress test.  I saw the patient on 09/26/2016, her lipid panel shows uncontrolled triglyceride level. She is on fenofibrate. He has previous myalgia with Lipitor, however was agreeable to try Pravachol 40 mg daily. I repeated a fasting lipid panel, he his triglyceride level improved, however still elevated. Since late last year, he also has been having increasing have episodes of recurrent palpitation. He mentioned this is similar to when he had atrial flutter in the past. I did obtain a 30 day event monitor, and this did not reveal any atrial flutter. I also started him on Cardizem CD 120 mg daily.  He presented today for cardiology office visit. He continued to have intermittent palpitation. The diltiazem did not seems to help with his symptom. His wife also mentions he had an episode of presyncope this past Tuesday. Unfortunately, by the time this event occurred, he has already returned the heart monitor. His  wife works in Art therapistthe medical field, she measured his heart rate at the time, she says her heart rate was mildly tachycardic. Whenever he has symptom, His wife has never noticed any hypotensive episode. Review of the recent monitor did not show any significant arrhythmia except for some PACs. In fact, majority of the time when he triggered event, the monitor did not pick up any arrhythmia. He did have several episodes where he became bradycardic into the 40s, however this episodes many occur at night when he is asleep. I discussed the case with Dr. Rennis GoldenHilty, I'm not entirely sure what caused the most recent presyncopal episode, based on the recent monitor, it may not have been triggered by arrhythmia.   Past Medical History:  Diagnosis Date  . Anginal pain (HCC)   . Arthritis    "neck" (12/23/2012)  . Asthma   . Atrial flutter (HCC)    a. s/p cervical spine surgery 4/14 => s/p DCCV;  b.  Echo 09/2012:  Mild LVH, EF 60-65%, Gr 2 DD.c. s/p atrial flutter ablation 12/23/2012 by Dr Johney FrameAllred  . Cervical disc disease    s/p cervical spine surgery 4/14  . COPD (chronic obstructive pulmonary disease) (HCC)   . Diabetes mellitus without complication (HCC)   . Exertional shortness of breath   . GERD (gastroesophageal reflux disease)   . Headache    "weekly" (12/23/2012)  . HTN (hypertension)   . Hyperlipidemia   . Hypogonadism male   . Migraines    "once q 3-4 months" (12/23/2012)    Past Surgical History:  Procedure Laterality Date  . ANTERIOR  CERVICAL DECOMP/DISCECTOMY FUSION N/A 09/22/2012   Procedure: ANTERIOR CERVICAL DECOMPRESSION/DISCECTOMY FUSION 2 LEVELS;  Surgeon: Karn Cassis, MD;  Location: [facility];  Service: Neurosurgery;  Laterality: N/A;  Cervical five-six Cervical six-seven Anterior cervical decompression/diskectomy/fusion  . ATRIAL FLUTTER ABLATION  12/23/2012   CTI ablation by Dr Johney Frame  . ATRIAL FLUTTER ABLATION N/A 12/23/2012   Procedure: ATRIAL FLUTTER ABLATION;  Surgeon: Hillis Range, MD;  Location: [facility] CATH LAB;  Service: Cardiovascular;  Laterality: N/A;  . CARDIAC CATHETERIZATION N/A 11/16/2015   Procedure: Left Heart Cath and Coronary Angiography;  Surgeon: Marykay Lex, MD;  Location: [facility] INVASIVE CV LAB;  Service: Cardiovascular;  Laterality: N/A;  . CARDIOVERSION N/A 09/23/2012   Procedure: CARDIOVERSION;  Surgeon: Gaylord Shih, MD;  Location: [facility] OR;  Service: Cardiovascular;  Laterality: N/A;  . TONSILLECTOMY  1973  . TREATMENT FISTULA ANAL  ~ 2007  . WISDOM TOOTH EXTRACTION      Current Medications: Outpatient Medications Prior to Visit  Medication Sig Dispense Refill  . albuterol (PROVENTIL HFA;VENTOLIN HFA) 108 (90 BASE) MCG/ACT inhaler Inhale 2 puffs into the lungs every 6 (six) hours as needed for wheezing.    Marland Kitchen aspirin EC 81 MG tablet Take 81 mg by mouth daily.    Marland Kitchen CINNAMON PO Take 1 capsule by mouth daily.    . fenofibrate 160 MG tablet Take 1 tablet (160 mg total) by mouth daily. 30 tablet 6  . fluticasone (FLONASE) 50 MCG/ACT nasal spray Place 2 sprays into the nose daily as needed for rhinitis or allergies.    . hydrochlorothiazide (MICROZIDE) 12.5 MG capsule Take 1 capsule (12.5 mg total) by mouth daily. 30 capsule 5  . ibuprofen (ADVIL,MOTRIN) 200 MG tablet Take 400-800 mg by mouth every 6 (six) hours as needed (For headache or pain.).    Marland Kitchen lisinopril (PRINIVIL,ZESTRIL) 40 MG tablet Take 1 tablet (40 mg total) by mouth daily. 90 tablet 3  . metFORMIN (GLUCOPHAGE) 500 MG tablet Take 1 tablet (500 mg total) by mouth 2 (two) times daily with a meal. 180 tablet 1  . Multiple Vitamin (MULTIVITAMIN WITH MINERALS) TABS tablet Take 1 tablet by mouth daily.    . nitroGLYCERIN (NITROSTAT) 0.4 MG SL tablet Place 1 tablet (0.4 mg total) under the tongue every 5 (five) minutes as needed for chest pain. 25 tablet 3  . omeprazole (PRILOSEC) 20 MG capsule Take 1 capsule (20 mg total) by mouth daily as needed (for acid reflux). Acid reflux. 30 capsule 5  .  ONETOUCH DELICA LANCETS 33G MISC EVERY DAY 100 each 11  . ONETOUCH VERIO test strip TWICE DAILY 100 each 11  . pravastatin (PRAVACHOL) 40 MG tablet Take 1 tablet (40 mg total) by mouth daily. 30 tablet 5  . Probiotic Product (PROBIOTIC PO) Take 1 capsule by mouth daily.    Marland Kitchen diltiazem (CARDIZEM CD) 120 MG 24 hr capsule Take 1 capsule (120 mg total) by mouth daily. 30 capsule 5   No facility-administered medications prior to visit.      Allergies:   Patient has no known allergies.   Social History   Social History  . Marital status: Married    Spouse name: N/A  . Number of children: N/A  . Years of education: N/A   Social History Main Topics  . Smoking status: Former Smoker    Packs/day: 1.50    Years: 22.00    Types: Cigarettes    Quit date: 06/16/2005  . Smokeless tobacco: Never Used  .  Alcohol use Yes     Comment: 12/23/2012 previously drank heavily - quit that 8 yrs ago; might have a beer q 5 yr"  . Drug use: No  . Sexual activity: Yes   Other Topics Concern  . None   Social History Narrative   Lives in [place] with his wife and 5 yr old son.  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     Family History:  The patient's family history includes Brain cancer in his father; Diabetes in his maternal grandfather and maternal uncle; Lung cancer in his mother.   ROS:   Please see the history of present illness.    ROS All other systems reviewed and are negative.   PHYSICAL EXAM:   VS:  BP 122/80   Pulse 67   Ht 5\' 10"  (1.778 m)   Wt 234 lb (106.1 kg)   BMI 33.58 kg/m    GEN: Well nourished, well developed, in no acute distress  HEENT: normal  Neck: no JVD, carotid bruits, or masses Cardiac: RRR; no murmurs, rubs, or gallops,no edema  Respiratory:  clear to auscultation bilaterally, normal work of breathing GI: soft, nontender, nondistended, + BS MS: no deformity or atrophy  Skin: warm and dry, no rash Neuro:  Alert and Oriented x 3, Strength and  sensation are intact Psych: euthymic mood, full affect  Wt Readings from Last 3 Encounters:  11/12/16 234 lb (106.1 kg)  09/26/16 230 lb 3.2 oz (104.4 kg)  09/11/16 230 lb (104.3 kg)      Studies/Labs Reviewed:   EKG:  EKG is not ordered today.   Recent Labs: 09/11/2016: BUN 15; Creatinine, Ser 0.97; Potassium 3.9; Sodium 141 11/07/2016: ALT 40   Lipid Panel    Component Value Date/Time   CHOL 104 11/07/2016 0817   CHOL 120 06/18/2016 1158   CHOL 110 11/10/2012 0935   TRIG 279 (H) 11/07/2016 0817   TRIG 397 (H) 12/22/2014 0839   TRIG 251 (H) 11/10/2012 0935   HDL 16 (L) 11/07/2016 0817   HDL 21 (L) 06/18/2016 1158   HDL 16 (L) 12/22/2014 0839   HDL 22 (L) 11/10/2012 0935   CHOLHDL 6.5 (H) 11/07/2016 0817   VLDL 56 (H) 11/07/2016 0817   LDLCALC 32 11/07/2016 0817   LDLCALC 23 06/18/2016 1158   LDLCALC 37 03/27/2014 0849   LDLCALC 38 11/10/2012 0935    Additional studies/ records that were reviewed today include:   Echo 11/06/2015 LV EF: 55% -   60%  Study Conclusions  - Left ventricle: The cavity size was normal. Systolic function was   normal. The estimated ejection fraction was in the range of 55%   to 60%. Wall motion was normal; there were no regional wall   motion abnormalities. Left ventricular diastolic function   parameters were normal.   Myoview 11/06/2015 Study Highlights    Nuclear stress EF: 57%.  The left ventricular ejection fraction is normal (55-65%).  There was no ST segment deviation noted during stress.  Defect 1: There is a small defect of mild severity present in the basal inferior location.  Findings consistent with prior Inferior wall myocardial infarction with peri-infarct ischemia.  This is a low risk study.     Cath 11/16/2015 Conclusion    Angiographically normal coronary arteries with a right dominant system.  The left ventricular systolic function is normal.   No culprit lesion found to explain the patient's  symptoms. Would suspect false-positive stress test.  Plan: Return to short stay for post catheter removal. Expected discharge later on today.     30 day event monitor Sinus rhythm with PACs   ASSESSMENT:    1. Dizziness of unknown cause   2. Hyperlipidemia with target LDL less than 100   3. PAC (premature atrial contraction)   4. Palpitation   5. Hypertension, unspecified type   6. Atrial flutter, unspecified type (HCC)   7. Hyperlipidemia, unspecified hyperlipidemia type   8. Controlled type 2 diabetes mellitus without complication, without long-term current use of insulin (HCC)      PLAN:  In order of problems listed above:  1. Dizziness: He has been having frequent episodes of dizziness associated with palpitation. He attributed it to premature atrial contraction. On recent event monitor, there were moderate PACs, however most of the patient triggered events, there was actually no underlying arrhythmia. Although it is possible due to the transient nature of PACs that by the time he triggered event, the PACs are the past, however the auto trigger feature should still kick in. He has not felt any difference after starting on the diltiazem. I will switch her to metoprolol tartrate. I am hesitant to give him really high dose as his heart rate seems to dip down to the 40s whenever he is asleep. He had an episode of presyncope on Tuesday while working in the yard, his wife was concerned this was arrhythmia related, unfortunately by that time, he has already return the monitor. Retrospectively, I did not see anything on the monitor that could have triggered a presyncopal event. He had a false positive stress test with negative cath last year, no suspicion of ischemia.  2. HTN: Normal blood pressure 122/80. Will switch his diltiazem to metoprolol tartrate.  3. HLD: Recent fasting lipid panel continued to show high triglyceride, will add Lovaza. His HDL has been quite low for many years.  According to the patient, he does not exercise at all. He will try to improve his activity level by focusing on some family activity.  4. DM II: On fenofibrate. I started him on Pravachol, he does have very mild muscle aches, however he is able to tolerate it.   5. Atrial flutter s/p ablation: Recent heart monitor showed no evidence of recurrence.    Medication Adjustments/Labs and Tests Ordered: Current medicines are reviewed at length with the patient today.  Concerns regarding medicines are outlined above.  Medication changes, Labs and Tests ordered today are listed in the Patient Instructions below. Patient Instructions  Medication Instructions:   STOP Diltiazem  START Metoprolol Tartrate 12.5mg  twice daily START Lovaza 1 gram twice daily.  Labwork:  Fasting lipid panel in 3 months   Testing/Procedures: none  Follow-Up:  2 months with Dr. Jens Som   If you need a refill on your cardiac medications before your next appointment, please call your pharmacy.      Ramond Dial, [place]  11/13/2016 8:15 PM    [facility] [address] [place], [place], [place]  [address] Phone: [phone]; Fax: [phone]

## 2016-11-12 NOTE — Progress Notes (Signed)
Above result and Lovaza chance discussed with patient during office visit today

## 2016-11-12 NOTE — Patient Instructions (Signed)
Medication Instructions:   STOP Diltiazem  START Metoprolol Tartrate 12.5mg  twice daily START Lovaza 1 gram twice daily.  Labwork:  Fasting lipid panel in 3 months   Testing/Procedures: none  Follow-Up:  2 months with Dr. [name]   If you need a refill on your cardiac medications before your next appointment, please call your pharmacy.

## 2016-11-13 ENCOUNTER — Encounter: Payer: Self-pay | Admitting: Physician Assistant

## 2016-12-30 ENCOUNTER — Ambulatory Visit (INDEPENDENT_AMBULATORY_CARE_PROVIDER_SITE_OTHER): Payer: 59 | Admitting: Family

## 2016-12-30 ENCOUNTER — Encounter: Payer: Self-pay | Admitting: Family

## 2016-12-30 VITALS — BP 131/84 | HR 59 | Temp 98.5°F | Ht 70.0 in | Wt 230.0 lb

## 2016-12-30 DIAGNOSIS — J020 Streptococcal pharyngitis: Secondary | ICD-10-CM

## 2016-12-30 DIAGNOSIS — J029 Acute pharyngitis, unspecified: Secondary | ICD-10-CM

## 2016-12-30 LAB — RAPID STREP SCREEN (MED CTR MEBANE ONLY): STREP GP A AG, IA W/REFLEX: POSITIVE — AB

## 2016-12-30 MED ORDER — AMOXICILLIN 875 MG PO TABS: 875.0000 mg | ORAL_TABLET | Freq: Two times a day (BID) | ORAL | 0 refills | Status: DC

## 2016-12-30 NOTE — Progress Notes (Signed)
   Subjective:    Patient ID: Sharyl Nimroderrance D Quashie, male    DOB: [birth year], 51 y.o.   MRN: [id number]  Sore Throat   This is a new problem. The current episode started in the past 7 days. The problem has been waxing and waning. Maximum temperature: 99. The pain is at a severity of 2/10. The pain is mild. Associated symptoms include congestion, coughing, headaches, a hoarse voice, a plugged ear sensation, swollen glands and trouble swallowing. Pertinent negatives include no ear discharge or ear pain. He has had exposure to strep. Exposure to: wife and son are being treated for strep. He has tried acetaminophen for the symptoms. The treatment provided mild relief.      Review of Systems  HENT: Positive for congestion, hoarse voice and trouble swallowing. Negative for ear discharge and ear pain.   Respiratory: Positive for cough.   Neurological: Positive for headaches.  All other systems reviewed and are negative.      Objective:   Physical Exam  Constitutional: He is oriented to person, place, and time. He appears well-developed and well-nourished. No distress.  HENT:  Head: Normocephalic.  Right Ear: External ear normal.  Left Ear: External ear normal.  Nose: Mucosal edema and rhinorrhea present.  Mouth/Throat: Posterior oropharyngeal erythema present.  Eyes: Pupils are equal, round, and reactive to light. Right eye exhibits no discharge. Left eye exhibits no discharge.  Neck: Normal range of motion. Neck supple. No thyromegaly present.  Cardiovascular: Normal rate, regular rhythm, normal heart sounds and intact distal pulses.   No murmur heard. Pulmonary/Chest: Effort normal and breath sounds normal. No respiratory distress. He has no wheezes.  Abdominal: Soft. Bowel sounds are normal. He exhibits no distension. There is no tenderness.  Musculoskeletal: Normal range of motion. He exhibits no edema or tenderness.  Neurological: He is alert and oriented to person, place, and time. He  has normal reflexes. No cranial nerve deficit.  Skin: Skin is warm and dry. No rash noted. No erythema.  Psychiatric: He has a normal mood and affect. His behavior is normal. Judgment and thought content normal.  Vitals reviewed.     BP 131/84   Pulse (!) 59   Temp 98.5 F (36.9 C) (Oral)   Ht 5\' 10"  (1.778 m)   Wt 230 lb (104.3 kg)   BMI 33.00 kg/m      Assessment & Plan:  1. Sore throat - Rapid strep screen (not at [facility]) - amoxicillin (AMOXIL) 875 MG tablet; Take 1 tablet (875 mg total) by mouth 2 (two) times daily.  Dispense: 20 tablet; Refill: 0  2. Strep throat - Take meds as prescribed - Use a cool mist humidifier  -Use saline nose sprays frequently -Saline irrigations of the nose can be very helpful if done frequently.  * 4X daily for 1 week*  * Use of a nettie pot can be helpful with this. Follow directions with this* -Force fluids -For any cough or congestion  Use plain Mucinex- regular strength or max strength is fine -For fever or aces or pains- take tylenol or ibuprofen appropriate for age and weight.  * for fevers greater than 101 orally you may alternate ibuprofen and tylenol every  3 hours. -Throat lozenges if help -New toothbrush in 3 days - amoxicillin (AMOXIL) 875 MG tablet; Take 1 tablet (875 mg total) by mouth 2 (two) times daily.  Dispense: 20 tablet; Refill: 0    Jannifer Rodneyhristy Denessa Cavan, [name]

## 2016-12-30 NOTE — Patient Instructions (Signed)
Strep Throat Strep throat is a bacterial infection of the throat. Your health care provider may call the infection tonsillitis or pharyngitis, depending on whether there is swelling in the tonsils or at the back of the throat. Strep throat is most common during the cold months of the year in children who are 5-51 years of age, but it can happen during any season in people of any age. This infection is spread from person to person (contagious) through coughing, sneezing, or close contact. What are the causes? Strep throat is caused by the bacteria called Streptococcus pyogenes. What increases the risk? This condition is more likely to develop in:  People who spend time in crowded places where the infection can spread easily.  People who have close contact with someone who has strep throat.  What are the signs or symptoms? Symptoms of this condition include:  Fever or chills.  Redness, swelling, or pain in the tonsils or throat.  Pain or difficulty when swallowing.  White or yellow spots on the tonsils or throat.  Swollen, tender glands in the neck or under the jaw.  Red rash all over the body (rare).  How is this diagnosed? This condition is diagnosed by performing a rapid strep test or by taking a swab of your throat (throat culture test). Results from a rapid strep test are usually ready in a few minutes, but throat culture test results are available after one or two days. How is this treated? This condition is treated with antibiotic medicine. Follow these instructions at home: Medicines  Take over-the-counter and prescription medicines only as told by your health care provider.  Take your antibiotic as told by your health care provider. Do not stop taking the antibiotic even if you start to feel better.  Have family members who also have a sore throat or fever tested for strep throat. They may need antibiotics if they have the strep infection. Eating and drinking  Do not  share food, drinking cups, or personal items that could cause the infection to spread to other people.  If swallowing is difficult, try eating soft foods until your sore throat feels better.  Drink enough fluid to keep your urine clear or pale yellow. General instructions  Gargle with a salt-water mixture 3-4 times per day or as needed. To make a salt-water mixture, completely dissolve -1 tsp of salt in 1 cup of warm water.  Make sure that all household members wash their hands well.  Get plenty of rest.  Stay home from school or work until you have been taking antibiotics for 24 hours.  Keep all follow-up visits as told by your health care provider. This is important. Contact a health care provider if:  The glands in your neck continue to get bigger.  You develop a rash, cough, or earache.  You cough up a thick liquid that is green, yellow-brown, or bloody.  You have pain or discomfort that does not get better with medicine.  Your problems seem to be getting worse rather than better.  You have a fever. Get help right away if:  You have new symptoms, such as vomiting, severe headache, stiff or painful neck, chest pain, or shortness of breath.  You have severe throat pain, drooling, or changes in your voice.  You have swelling of the neck, or the skin on the neck becomes red and tender.  You have signs of dehydration, such as fatigue, dry mouth, and decreased urination.  You become increasingly sleepy, or   you cannot wake up completely.  Your joints become red or painful. This information is not intended to replace advice given to you by your health care provider. Make sure you discuss any questions you have with your health care provider. Document Released: 05/30/2000 Document Revised: 01/30/2016 Document Reviewed: 09/25/2014 Elsevier Interactive Patient Education  2017 [employer]  

## 2017-01-08 ENCOUNTER — Other Ambulatory Visit: Payer: Self-pay | Admitting: Family

## 2017-01-08 ENCOUNTER — Telehealth: Payer: Self-pay | Admitting: Family

## 2017-01-08 MED ORDER — AZITHROMYCIN 250 MG PO TABS: ORAL_TABLET | ORAL | 0 refills | Status: DC

## 2017-01-08 NOTE — Telephone Encounter (Signed)
Zpak Prescription sent to pharmacy, pt needs to get new toothbrush, no sharing drinks or food.

## 2017-01-08 NOTE — Telephone Encounter (Signed)
Pt aware of abx sent in & recommendations

## 2017-01-08 NOTE — Telephone Encounter (Signed)
What symptoms do you have? Still having systems of strep throat  How long have you been sick? 2 weeks  Have you been seen for this problem? Yes  If your provider decides to give you a prescription, which pharmacy would you like for it to be sent to? [facility] [place]   Patient informed that this information will be sent to the clinical staff for review and that they should receive a follow up call.

## 2017-01-28 ENCOUNTER — Encounter: Payer: Self-pay | Admitting: Cardiology

## 2017-02-09 NOTE — Progress Notes (Signed)
HPI: FU atrial flutter. He developed AFlutter with RVR after cervical decompression and discectomy for cervical spondylosis in April 2014. He was rate controlled with IV diltiazem. He did not convert on his own and underwent DCCV within 24 hours of onset. Patient had recurrent atrial flutter and underwent ablation in July 2014. Nuclear study May 2017 showed normal LV function. Cardiac catheterization June 2017 showed normal coronary arteries. Monitor April 2018 showed sinus with PACs. Since last seen, patient has occasional palpitations but improved on metoprolol. He does not have chest pain. No dyspnea on exertion. No syncope.  Current Outpatient Prescriptions  Medication Sig Dispense Refill  . aspirin EC 81 MG tablet Take 81 mg by mouth daily.    Marland Kitchen CINNAMON PO Take 1 capsule by mouth daily.    . fenofibrate 160 MG tablet Take 1 tablet (160 mg total) by mouth daily. 30 tablet 6  . fluticasone (FLONASE) 50 MCG/ACT nasal spray Place 2 sprays into the nose daily as needed for rhinitis or allergies.    . hydrochlorothiazide (MICROZIDE) 12.5 MG capsule Take 1 capsule (12.5 mg total) by mouth daily. 30 capsule 5  . ibuprofen (ADVIL,MOTRIN) 200 MG tablet Take 400-800 mg by mouth every 6 (six) hours as needed (For headache or pain.).    Marland Kitchen lisinopril (PRINIVIL,ZESTRIL) 40 MG tablet Take 1 tablet (40 mg total) by mouth daily. 90 tablet 3  . metFORMIN (GLUCOPHAGE) 500 MG tablet Take 1 tablet (500 mg total) by mouth 2 (two) times daily with a meal. 180 tablet 1  . Multiple Vitamin (MULTIVITAMIN WITH MINERALS) TABS tablet Take 1 tablet by mouth daily.    . nitroGLYCERIN (NITROSTAT) 0.4 MG SL tablet Place 1 tablet (0.4 mg total) under the tongue every 5 (five) minutes as needed for chest pain. 25 tablet 3  . omega-3 acid ethyl esters (LOVAZA) 1 g capsule Take 1 capsule (1 g total) by mouth 2 (two) times daily. 180 capsule 1  . omeprazole (PRILOSEC) 20 MG capsule Take 1 capsule (20 mg total) by mouth  daily as needed (for acid reflux). Acid reflux. 30 capsule 5  . ONETOUCH DELICA LANCETS 33G MISC EVERY DAY 100 each 11  . ONETOUCH VERIO test strip TWICE DAILY 100 each 11  . pravastatin (PRAVACHOL) 40 MG tablet Take 1 tablet (40 mg total) by mouth daily. 30 tablet 5  . Probiotic Product (PROBIOTIC PO) Take 1 capsule by mouth daily.    . VENTOLIN HFA 108 (90 Base) MCG/ACT inhaler USE 2 PUFFS 4 TIMES DAILY AS NEEDED 18 g 2  . metoprolol tartrate (LOPRESSOR) 25 MG tablet Take 0.5 tablets (12.5 mg total) by mouth 2 (two) times daily. 90 tablet 1   No current facility-administered medications for this visit.      Past Medical History:  Diagnosis Date  . Anginal pain (HCC)   . Arthritis    "neck" (12/23/2012)  . Asthma   . Atrial flutter (HCC)    a. s/p cervical spine surgery 4/14 => s/p DCCV;  b.  Echo 09/2012:  Mild LVH, EF 60-65%, Gr 2 DD.c. s/p atrial flutter ablation 12/23/2012 by Dr Johney Frame  . Cervical disc disease    s/p cervical spine surgery 4/14  . COPD (chronic obstructive pulmonary disease) (HCC)   . Diabetes mellitus without complication (HCC)   . Exertional shortness of breath   . GERD (gastroesophageal reflux disease)   . Headache    "weekly" (12/23/2012)  . HTN (hypertension)   . Hyperlipidemia   .  Hypogonadism male   . Migraines    "once q 3-4 months" (12/23/2012)    Past Surgical History:  Procedure Laterality Date  . ANTERIOR CERVICAL DECOMP/DISCECTOMY FUSION N/A 09/22/2012   Procedure: ANTERIOR CERVICAL DECOMPRESSION/DISCECTOMY FUSION 2 LEVELS;  Surgeon: Karn Cassis, MD;  Location: [facility];  Service: Neurosurgery;  Laterality: N/A;  Cervical five-six Cervical six-seven Anterior cervical decompression/diskectomy/fusion  . ATRIAL FLUTTER ABLATION  12/23/2012   CTI ablation by Dr Johney Frame  . ATRIAL FLUTTER ABLATION N/A 12/23/2012   Procedure: ATRIAL FLUTTER ABLATION;  Surgeon: Hillis Range, MD;  Location: [facility] CATH LAB;  Service: Cardiovascular;  Laterality: N/A;  .  CARDIAC CATHETERIZATION N/A 11/16/2015   Procedure: Left Heart Cath and Coronary Angiography;  Surgeon: Marykay Lex, MD;  Location: [facility] INVASIVE CV LAB;  Service: Cardiovascular;  Laterality: N/A;  . CARDIOVERSION N/A 09/23/2012   Procedure: CARDIOVERSION;  Surgeon: Gaylord Shih, MD;  Location: [facility] OR;  Service: Cardiovascular;  Laterality: N/A;  . TONSILLECTOMY  1973  . TREATMENT FISTULA ANAL  ~ 2007  . WISDOM TOOTH EXTRACTION      Social History   Social History  . Marital status: Married    Spouse name: N/A  . Number of children: N/A  . Years of education: N/A   Occupational History  . Not on file.   Social History Main Topics  . Smoking status: Former Smoker    Packs/day: 1.50    Years: 22.00    Types: Cigarettes    Quit date: 06/16/2005  . Smokeless tobacco: Never Used  . Alcohol use Yes     Comment: 12/23/2012 previously drank heavily - quit that 8 yrs ago; might have a beer q 5 yr"  . Drug use: No  . Sexual activity: Yes   Other Topics Concern  . Not on file   Social History Narrative   Lives in [place] with his wife and 20 yr old son.  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    Family History  Problem Relation Age of Onset  . Lung cancer Mother        died @ 65  . Brain cancer Father        died @ 51  . Diabetes Maternal Uncle   . Diabetes Maternal Grandfather     ROS: Some muscle aches but no fevers or chills, productive cough, hemoptysis, dysphasia, odynophagia, melena, hematochezia, dysuria, hematuria, rash, seizure activity, orthopnea, PND, pedal edema, claudication. Remaining systems are negative.  Physical Exam: Well-developed well-nourished in no acute distress.  Skin is warm and dry.  HEENT is normal.  Neck is supple.  Chest is clear to auscultation with normal expansion.  Cardiovascular exam is regular rate and rhythm.  Abdominal exam nontender or distended. No masses palpated. Extremities show no edema. neuro grossly  intact  ECG- 09/26/2016-sinus rhythm with nonspecific ST changes. personally reviewed  A/P  1 atrial flutter-status post ablation. No recurrent documented arrhythmias.   2 palpitations-he had palpitations with a monitor in place which documented PACs. They have improved on higher dose metoprolol but persist. Increase metoprolol to 25 mg twice a day and follow.  3 Hypertension-blood pressure is mildly elevated. Increase metoprolol to 25 mg twice a day. If blood pressure remains elevated I will add amlodipine 5 mg daily. He will follow his blood pressure at home and contact us.  4 hyperlipidemia-  continue present medications. Lipids and liver pending.   Olga Millers, MD

## 2017-02-18 ENCOUNTER — Other Ambulatory Visit: Payer: 59

## 2017-02-18 ENCOUNTER — Other Ambulatory Visit: Payer: Self-pay | Admitting: *Deleted

## 2017-02-18 ENCOUNTER — Telehealth: Payer: Self-pay | Admitting: Cardiology

## 2017-02-18 DIAGNOSIS — E785 Hyperlipidemia, unspecified: Secondary | ICD-10-CM

## 2017-02-18 NOTE — Telephone Encounter (Signed)
Received notification that patient needs lab orders faxed [facility].    Lab order faxed to [phone].

## 2017-02-19 ENCOUNTER — Ambulatory Visit (INDEPENDENT_AMBULATORY_CARE_PROVIDER_SITE_OTHER): Payer: No Typology Code available for payment source | Admitting: Cardiology

## 2017-02-19 ENCOUNTER — Encounter: Payer: Self-pay | Admitting: Cardiology

## 2017-02-19 VITALS — BP 140/68 | HR 60 | Ht 70.0 in | Wt 232.6 lb

## 2017-02-19 DIAGNOSIS — I483 Typical atrial flutter: Secondary | ICD-10-CM | POA: Diagnosis not present

## 2017-02-19 DIAGNOSIS — R002 Palpitations: Secondary | ICD-10-CM

## 2017-02-19 DIAGNOSIS — I10 Essential (primary) hypertension: Secondary | ICD-10-CM | POA: Diagnosis not present

## 2017-02-19 DIAGNOSIS — E7800 Pure hypercholesterolemia, unspecified: Secondary | ICD-10-CM | POA: Diagnosis not present

## 2017-02-19 LAB — LIPID PANEL
CHOL/HDL RATIO: 6.1 ratio — AB (ref 0.0–5.0)
CHOLESTEROL TOTAL: 110 mg/dL (ref 100–199)
HDL: 18 mg/dL — AB (ref >39)
LDL CALC: 45 mg/dL (ref 0–99)
TRIGLYCERIDES: 233 mg/dL — AB (ref 0–149)
VLDL CHOLESTEROL CAL: 47 mg/dL — AB (ref 5–40)

## 2017-02-19 MED ORDER — METOPROLOL TARTRATE 25 MG PO TABS: 25.0000 mg | ORAL_TABLET | Freq: Two times a day (BID) | ORAL | 3 refills | Status: DC

## 2017-02-19 NOTE — Patient Instructions (Signed)
Medication Instructions:   INCREASE METOPROLOL TO 25 MG TWICE DAILY= 1 WHOLE TABLET TWICE DAILY  Follow-Up:  Your physician wants you to follow-up in: 6 MONTHS WITH DR [name] You will receive a reminder letter in the mail two months in advance. If you don't receive a letter, please call our office to schedule the follow-up appointment.   If you need a refill on your cardiac medications before your next appointment, please call your pharmacy.   TRACK BLOOD PRESSURE AND CALL WITH CONCERNS

## 2017-03-02 ENCOUNTER — Encounter: Payer: Self-pay | Admitting: Family Medicine

## 2017-03-02 ENCOUNTER — Ambulatory Visit (INDEPENDENT_AMBULATORY_CARE_PROVIDER_SITE_OTHER): Payer: 59 | Admitting: Family Medicine

## 2017-03-02 VITALS — BP 134/86 | HR 50 | Temp 98.0°F | Ht 70.0 in | Wt 228.6 lb

## 2017-03-02 DIAGNOSIS — R5383 Other fatigue: Secondary | ICD-10-CM | POA: Diagnosis not present

## 2017-03-02 DIAGNOSIS — W57XXXA Bitten or stung by nonvenomous insect and other nonvenomous arthropods, initial encounter: Secondary | ICD-10-CM | POA: Diagnosis not present

## 2017-03-02 DIAGNOSIS — E1121 Type 2 diabetes mellitus with diabetic nephropathy: Secondary | ICD-10-CM

## 2017-03-02 LAB — BAYER DCA HB A1C WAIVED: HB A1C (BAYER DCA - WAIVED): 6.9 % (ref <7.0)

## 2017-03-02 MED ORDER — DOXYCYCLINE HYCLATE 100 MG PO TABS: 100.0000 mg | ORAL_TABLET | Freq: Two times a day (BID) | ORAL | 0 refills | Status: DC

## 2017-03-02 NOTE — Progress Notes (Signed)
   HPI  Patient presents today here to discuss fatigue intake bite.  Fatigue Patient complains of one month of slowly worsening fatigue and joint pain. Joint pain started on the left hip, left shoulder, left knee and has spread throughout his body.  He had multiple insect bites over the last 4 or 5 months. The last bite was about 4-6 weeks ago and is still healing.  Denies fever, chills, sweats. He is tolerating food fluids like usual. He's had approximately one week of nausea off and on with slightly decreased appetite which is very unusual for him  Patient has recently been treated with beta blocker by cardiology  PMH: Smoking status noted ROS: Per HPI  Objective: BP 134/86   Pulse (!) 50   Temp 98 F (36.7 C) (Oral)   Ht '5\' 10"'$  (1.778 m)   Wt 228 lb 9.6 oz (103.7 kg)   BMI 32.80 kg/m  Gen: NAD, alert, cooperative with exam HEENT: NCAT, EOMI, PERRL CV: RRR, good S1/S2, no murmur Resp: CTABL, no wheezes, non-labored Ext: No edema, warm Neuro: Alert and oriented, No gross deficits Skin:  Small red erythematous lesion around 6 inches above the umbilicus just right of midline  Assessment and plan:  # Tick bite 4-5 tick bites, on the right abdomen he has a resolving tick bite. Lyme and RMSF titers Treat with  doxycycline  # Type 2 diabetes Patient reports decrease in control, fasting blood sugar this morning was in the 180s. Continue metformin twice daily Repeat A1c  # Fatigue Possibly multifactorial, possibly due to tick bites, possible Lyme disease. Also possibly due to new treatment with beta blocker, this should improve gradually as he continues. Heart rate is low today likely due to beta blocker, there are no red flags, no changes   Orders Placed This Encounter  Procedures  . [place] spotted fvr abs pnl(IgG+IgM)  . Lyme Ab/Western Blot Reflex  . CBC with Differential/Platelet  . CMP14+EGFR  . Bayer DCA Hb A1c Waived    Meds ordered this encounter    Medications  . doxycycline (VIBRA-TABS) 100 MG tablet    Sig: Take 1 tablet (100 mg total) by mouth 2 (two) times daily. 1 po bid    Dispense:  28 tablet    Refill:  0    Laroy Apple, MD [facility] 03/02/2017, 9:23 AM

## 2017-03-02 NOTE — Patient Instructions (Signed)
Great to meet you!  Come back to see your PCP in  3 months unless you need [place] sooner.   We will cal with labs or send them on mychart within 1 week.

## 2017-03-04 LAB — LYME AB/WESTERN BLOT REFLEX: LYME DISEASE AB, QUANT, IGM: <0.8 index (ref 0.00–0.79)

## 2017-03-04 LAB — CBC WITH DIFFERENTIAL/PLATELET
BASOS: 0 %
Basophils Absolute: 0 10*3/uL (ref 0.0–0.2)
EOS (ABSOLUTE): 0.2 10*3/uL (ref 0.0–0.4)
EOS: 3 %
HEMOGLOBIN: 14.8 g/dL (ref 13.0–17.7)
Hematocrit: 42.4 % (ref 37.5–51.0)
IMMATURE GRANS (ABS): 0 10*3/uL (ref 0.0–0.1)
IMMATURE GRANULOCYTES: 0 %
LYMPHS: 32 %
Lymphocytes Absolute: 2.4 10*3/uL (ref 0.7–3.1)
MCH: 29.8 pg (ref 26.6–33.0)
MCHC: 34.9 g/dL (ref 31.5–35.7)
MCV: 85 fL (ref 79–97)
MONOCYTES: 7 %
Monocytes Absolute: 0.5 10*3/uL (ref 0.1–0.9)
NEUTROS ABS: 4.3 10*3/uL (ref 1.4–7.0)
NEUTROS PCT: 58 %
PLATELETS: 247 10*3/uL (ref 150–379)
RBC: 4.97 x10E6/uL (ref 4.14–5.80)
RDW: 13.9 % (ref 12.3–15.4)
WBC: 7.4 10*3/uL (ref 3.4–10.8)

## 2017-03-04 LAB — CMP14+EGFR
ALBUMIN: 4.6 g/dL (ref 3.5–5.5)
ALT: 62 IU/L — AB (ref 0–44)
AST: 46 IU/L — ABNORMAL HIGH (ref 0–40)
Albumin/Globulin Ratio: 1.9 (ref 1.2–2.2)
Alkaline Phosphatase: 56 IU/L (ref 39–117)
BILIRUBIN TOTAL: 0.7 mg/dL (ref 0.0–1.2)
BUN / CREAT RATIO: 20 (ref 9–20)
BUN: 18 mg/dL (ref 6–24)
CO2: 24 mmol/L (ref 20–29)
CREATININE: 0.91 mg/dL (ref 0.76–1.27)
Calcium: 9.5 mg/dL (ref 8.7–10.2)
Chloride: 102 mmol/L (ref 96–106)
GFR calc Af Amer: 113 mL/min/{1.73_m2} (ref >59)
GFR calc non Af Amer: 98 mL/min/{1.73_m2} (ref >59)
GLUCOSE: 168 mg/dL — AB (ref 65–99)
Globulin, Total: 2.4 g/dL (ref 1.5–4.5)
Potassium: 4.6 mmol/L (ref 3.5–5.2)
Sodium: 141 mmol/L (ref 134–144)
TOTAL PROTEIN: 7 g/dL (ref 6.0–8.5)

## 2017-03-04 LAB — RMSF, IGG, IFA

## 2017-03-04 LAB — ROCKY MTN SPOTTED FVR ABS PNL(IGG+IGM)
RMSF IgG: UNDETERMINED
RMSF IgM: 0.65 index (ref 0.00–0.89)

## 2017-03-05 ENCOUNTER — Encounter: Payer: Self-pay | Admitting: Family Medicine

## 2017-03-12 ENCOUNTER — Other Ambulatory Visit: Payer: Self-pay | Admitting: Physician Assistant

## 2017-03-13 NOTE — Telephone Encounter (Signed)
REFILL 

## 2017-03-13 NOTE — Telephone Encounter (Signed)
Please review for refill, thanks ! 

## 2017-03-30 ENCOUNTER — Encounter: Payer: Self-pay | Admitting: Family Medicine

## 2017-03-30 ENCOUNTER — Ambulatory Visit (INDEPENDENT_AMBULATORY_CARE_PROVIDER_SITE_OTHER): Payer: 59 | Admitting: Family Medicine

## 2017-03-30 VITALS — BP 125/81 | HR 51 | Temp 98.0°F | Ht 70.0 in | Wt 229.0 lb

## 2017-03-30 DIAGNOSIS — M542 Cervicalgia: Secondary | ICD-10-CM | POA: Diagnosis not present

## 2017-03-30 DIAGNOSIS — R531 Weakness: Secondary | ICD-10-CM

## 2017-03-30 DIAGNOSIS — M5090 Cervical disc disorder, unspecified, unspecified cervical region: Secondary | ICD-10-CM

## 2017-03-30 DIAGNOSIS — R202 Paresthesia of skin: Secondary | ICD-10-CM

## 2017-03-30 DIAGNOSIS — R200 Anesthesia of skin: Secondary | ICD-10-CM | POA: Diagnosis not present

## 2017-03-30 MED ORDER — PREDNISONE 20 MG PO TABS: ORAL_TABLET | ORAL | 0 refills | Status: DC

## 2017-03-30 MED ORDER — DIAZEPAM 5 MG PO TABS: 5.0000 mg | ORAL_TABLET | Freq: Two times a day (BID) | ORAL | 1 refills | Status: DC | PRN

## 2017-03-30 NOTE — Progress Notes (Signed)
BP 125/81   Pulse (!) 51   Temp 98 F (36.7 C) (Oral)   Ht  (1.778 m)   Wt 229 lb (103.9 kg)   BMI 32.86 kg/m    Subjective:    Patient ID: Benjamin Hale, male    DOB: 06/08/1966, 51 y.o.   MRN: [id number]  HPI: Benjamin Hale is a 51 y.o. male presenting on 03/30/2017 for Neck Pain (began about 1 monh ago; pain, stiffness, seems to be losing mobility, numbness in fingertips, painful to raise arms; history of neck surgery, tried Robaxin & Lyrica left over from surgery, heat and ice)   HPI Neck pain with numbness and weakness Patient has been having neck pain for the past month. He started out with just the stiffness and the pain but then has developed into having numbness in his fingertips and pain with raising arms. He does have a history of a neck surgery. He's been trying Robaxin and Lyrica and heat and ice which have helped some but not enough. He says the numbness is worse in his right hand than his left. He rates the pain as a 7 out of 10 but is more concerned with the numbness and weakness that he started to develop in his hand. He denies any fevers or chills or redness or warmth.  Relevant past medical, surgical, family and social history reviewed and updated as indicated. Interim medical history since our last visit reviewed. Allergies and medications reviewed and updated.  Review of Systems  Constitutional: Negative for chills and fever.  Respiratory: Negative for shortness of breath and wheezing.   Cardiovascular: Negative for chest pain and leg swelling.  Musculoskeletal: Positive for neck pain and neck stiffness. Negative for back pain and gait problem.  Skin: Negative for rash.  Neurological: Positive for weakness and numbness.  All other systems reviewed and are negative.   Per HPI unless specifically indicated above     Objective:    BP 125/81   Pulse (!) 51   Temp 98 F (36.7 C) (Oral)   Ht  (1.778 m)   Wt 229 lb (103.9 kg)   BMI  32.86 kg/m   Wt Readings from Last 3 Encounters:  03/30/17 229 lb (103.9 kg)  03/02/17 228 lb 9.6 oz (103.7 kg)  02/19/17 232 lb 9.6 oz (105.5 kg)    Physical Exam  Constitutional: He is oriented to person, place, and time. He appears well-developed and well-nourished. No distress.  Eyes: Conjunctivae are normal. No scleral icterus.  Cardiovascular: Normal rate, regular rhythm, normal heart sounds and intact distal pulses.   No murmur heard. Pulmonary/Chest: Effort normal and breath sounds normal. No respiratory distress. He has no wheezes. He has no rales.  Musculoskeletal: Normal range of motion. He exhibits no edema.       Cervical back: He exhibits tenderness (Midline neck tenderness and bilateral paraspinal muscular tenderness, patient also complains of numbness and weakness in his right hand specifically on the fourth and fifth digits and also has it sometimes on his left hand) and bony tenderness.  Neurological: He is alert and oriented to person, place, and time. Coordination normal.  Skin: Skin is warm and dry. No rash noted. He is not diaphoretic.  Psychiatric: He has a normal mood and affect. His behavior is normal.  Nursing note and vitals reviewed.       Assessment & Plan:   Problem List Items Addressed This Visit    None  Visit Diagnoses    Cervical neck pain with evidence of disc disease    -  Primary   Relevant Medications   predniSONE (DELTASONE) 20 MG tablet   diazepam (VALIUM) 5 MG tablet   Other Relevant Orders   Ambulatory referral to Neurosurgery   Weakness       Relevant Medications   predniSONE (DELTASONE) 20 MG tablet   diazepam (VALIUM) 5 MG tablet   Other Relevant Orders   Ambulatory referral to Neurosurgery   Numbness and tingling in both hands       Relevant Medications   predniSONE (DELTASONE) 20 MG tablet   diazepam (VALIUM) 5 MG tablet   Other Relevant Orders   Ambulatory referral to Neurosurgery   Cervicalgia       Relevant Orders    MR CERVICAL SPINE WO CONTRAST   Ambulatory referral to Neurosurgery       Follow up plan: Return if symptoms worsen or fail to improve.  Counseling provided for all of the vaccine components Orders Placed This Encounter  Procedures  . MR CERVICAL SPINE WO CONTRAST  . Ambulatory referral to Neurosurgery    Arville Care, MD [facility] 03/30/2017, 8:33 AM

## 2017-04-07 ENCOUNTER — Ambulatory Visit (INDEPENDENT_AMBULATORY_CARE_PROVIDER_SITE_OTHER): Payer: 59

## 2017-04-07 DIAGNOSIS — Z23 Encounter for immunization: Secondary | ICD-10-CM | POA: Diagnosis not present

## 2017-04-08 ENCOUNTER — Ambulatory Visit (HOSPITAL_COMMUNITY)
Admission: RE | Admit: 2017-04-08 | Discharge: 2017-04-08 | Disposition: A | Discharge status: Home / Self Care | Payer: No Typology Code available for payment source | Source: Ambulatory Visit | Attending: Family Medicine | Admitting: Family Medicine

## 2017-04-08 DIAGNOSIS — M542 Cervicalgia: Secondary | ICD-10-CM | POA: Insufficient documentation

## 2017-04-08 DIAGNOSIS — M4802 Spinal stenosis, cervical region: Secondary | ICD-10-CM | POA: Diagnosis not present

## 2017-04-10 ENCOUNTER — Other Ambulatory Visit: Payer: Self-pay | Admitting: *Deleted

## 2017-04-10 DIAGNOSIS — M48061 Spinal stenosis, lumbar region without neurogenic claudication: Secondary | ICD-10-CM

## 2017-04-13 ENCOUNTER — Other Ambulatory Visit: Payer: Self-pay | Admitting: Physician Assistant

## 2017-04-14 NOTE — Telephone Encounter (Signed)
Please review for refill, thanks ! 

## 2017-04-14 NOTE — Telephone Encounter (Signed)
Rx(s) sent to pharmacy electronically.  

## 2017-05-08 ENCOUNTER — Other Ambulatory Visit: Payer: Self-pay | Admitting: Family Medicine

## 2017-05-08 DIAGNOSIS — M5090 Cervical disc disorder, unspecified, unspecified cervical region: Secondary | ICD-10-CM

## 2017-05-08 DIAGNOSIS — R531 Weakness: Secondary | ICD-10-CM

## 2017-05-08 DIAGNOSIS — R200 Anesthesia of skin: Secondary | ICD-10-CM

## 2017-05-08 DIAGNOSIS — R202 Paresthesia of skin: Secondary | ICD-10-CM

## 2017-05-08 NOTE — Telephone Encounter (Signed)
Phoned in.

## 2017-05-08 NOTE — Telephone Encounter (Signed)
Please call in diazepam with   1 refills 

## 2017-06-10 ENCOUNTER — Other Ambulatory Visit: Payer: Self-pay | Admitting: Nurse Practitioner

## 2017-06-10 DIAGNOSIS — E785 Hyperlipidemia, unspecified: Secondary | ICD-10-CM

## 2017-07-17 ENCOUNTER — Other Ambulatory Visit: Payer: Self-pay | Admitting: Nurse Practitioner

## 2017-07-17 DIAGNOSIS — R202 Paresthesia of skin: Secondary | ICD-10-CM

## 2017-07-17 DIAGNOSIS — E119 Type 2 diabetes mellitus without complications: Secondary | ICD-10-CM

## 2017-07-17 DIAGNOSIS — I10 Essential (primary) hypertension: Secondary | ICD-10-CM

## 2017-07-17 DIAGNOSIS — M5090 Cervical disc disorder, unspecified, unspecified cervical region: Secondary | ICD-10-CM

## 2017-07-17 DIAGNOSIS — R200 Anesthesia of skin: Secondary | ICD-10-CM

## 2017-07-17 DIAGNOSIS — R531 Weakness: Secondary | ICD-10-CM

## 2017-07-17 NOTE — Telephone Encounter (Signed)
Diazepam last filled 06/10/2017

## 2017-07-21 LAB — HM DIABETES EYE EXAM

## 2017-07-27 ENCOUNTER — Encounter: Payer: Self-pay | Admitting: Nurse Practitioner

## 2017-07-27 ENCOUNTER — Ambulatory Visit: Payer: 59 | Admitting: Nurse Practitioner

## 2017-07-27 VITALS — BP 135/79 | HR 48 | Temp 97.6°F | Ht 70.0 in | Wt 227.0 lb

## 2017-07-27 DIAGNOSIS — E785 Hyperlipidemia, unspecified: Secondary | ICD-10-CM | POA: Diagnosis not present

## 2017-07-27 DIAGNOSIS — K219 Gastro-esophageal reflux disease without esophagitis: Secondary | ICD-10-CM | POA: Diagnosis not present

## 2017-07-27 DIAGNOSIS — E119 Type 2 diabetes mellitus without complications: Secondary | ICD-10-CM | POA: Diagnosis not present

## 2017-07-27 DIAGNOSIS — I10 Essential (primary) hypertension: Secondary | ICD-10-CM | POA: Diagnosis not present

## 2017-07-27 LAB — BAYER DCA HB A1C WAIVED: HB A1C: 7.8 % — AB (ref <7.0)

## 2017-07-27 MED ORDER — PRAVASTATIN SODIUM 40 MG PO TABS: ORAL_TABLET | ORAL | 1 refills | Status: DC

## 2017-07-27 MED ORDER — METFORMIN HCL 500 MG PO TABS: 1000.0000 mg | ORAL_TABLET | Freq: Two times a day (BID) | ORAL | 1 refills | Status: DC

## 2017-07-27 MED ORDER — METOPROLOL TARTRATE 25 MG PO TABS: 25.0000 mg | ORAL_TABLET | Freq: Two times a day (BID) | ORAL | 1 refills | Status: DC

## 2017-07-27 MED ORDER — OMEPRAZOLE 20 MG PO CPDR: 20.0000 mg | DELAYED_RELEASE_CAPSULE | Freq: Every day | ORAL | 5 refills | Status: DC | PRN

## 2017-07-27 MED ORDER — LISINOPRIL 40 MG PO TABS: 40.0000 mg | ORAL_TABLET | Freq: Every day | ORAL | 1 refills | Status: DC

## 2017-07-27 MED ORDER — FENOFIBRATE 160 MG PO TABS: ORAL_TABLET | ORAL | 1 refills | Status: DC

## 2017-07-27 MED ORDER — HYDROCHLOROTHIAZIDE 12.5 MG PO CAPS: ORAL_CAPSULE | ORAL | 1 refills | Status: DC

## 2017-07-27 MED ORDER — OMEGA-3-ACID ETHYL ESTERS 1 G PO CAPS: 1.0000 | ORAL_CAPSULE | Freq: Two times a day (BID) | ORAL | 1 refills | Status: DC

## 2017-07-27 NOTE — Patient Instructions (Signed)
Diabetes Mellitus and Nutrition When you have diabetes (diabetes mellitus), it is very important to have healthy eating habits because your blood sugar (glucose) levels are greatly affected by what you eat and drink. Eating healthy foods in the appropriate amounts, at about the same times every day, can help you:  Control your blood glucose.  Lower your risk of heart disease.  Improve your blood pressure.  Reach or maintain a healthy weight.  Every person with diabetes is different, and each person has different needs for a meal plan. Your health care provider may recommend that you work with a diet and nutrition specialist (dietitian) to make a meal plan that is best for you. Your meal plan may vary depending on factors such as:  The calories you need.  The medicines you take.  Your weight.  Your blood glucose, blood pressure, and cholesterol levels.  Your activity level.  Other health conditions you have, such as heart or kidney disease.  How do carbohydrates affect me? Carbohydrates affect your blood glucose level more than any other type of food. Eating carbohydrates naturally increases the amount of glucose in your blood. Carbohydrate counting is a method for keeping track of how many carbohydrates you eat. Counting carbohydrates is important to keep your blood glucose at a healthy level, especially if you use insulin or take certain oral diabetes medicines. It is important to know how many carbohydrates you can safely have in each meal. This is different for every person. Your dietitian can help you calculate how many carbohydrates you should have at each meal and for snack. Foods that contain carbohydrates include:  Bread, cereal, rice, pasta, and crackers.  Potatoes and corn.  Peas, beans, and lentils.  Milk and yogurt.  Fruit and juice.  Desserts, such as cakes, cookies, ice cream, and candy.  How does alcohol affect me? Alcohol can cause a sudden decrease in blood  glucose (hypoglycemia), especially if you use insulin or take certain oral diabetes medicines. Hypoglycemia can be a life-threatening condition. Symptoms of hypoglycemia (sleepiness, dizziness, and confusion) are similar to symptoms of having too much alcohol. If your health care provider says that alcohol is safe for you, follow these guidelines:  Limit alcohol intake to no more than 1 drink per day for nonpregnant women and 2 drinks per day for men. One drink equals 12 oz of beer, 5 oz of wine, or 1 oz of hard liquor.  Do not drink on an empty stomach.  Keep yourself hydrated with water, diet soda, or unsweetened iced tea.  Keep in mind that regular soda, juice, and other mixers may contain a lot of sugar and must be counted as carbohydrates.  What are tips for following this plan? Reading food labels  Start by checking the serving size on the label. The amount of calories, carbohydrates, fats, and other nutrients listed on the label are based on one serving of the food. Many foods contain more than one serving per package.  Check the total grams (g) of carbohydrates in one serving. You can calculate the number of servings of carbohydrates in one serving by dividing the total carbohydrates by 15. For example, if a food has 30 g of total carbohydrates, it would be equal to 2 servings of carbohydrates.  Check the number of grams (g) of saturated and trans fats in one serving. Choose foods that have low or no amount of these fats.  Check the number of milligrams (mg) of sodium in one serving. Most people   should limit total sodium intake to less than 2,300 mg per day.  Always check the nutrition information of foods labeled as "low-fat" or "nonfat". These foods may be higher in added sugar or refined carbohydrates and should be avoided.  Talk to your dietitian to identify your daily goals for nutrients listed on the label. Shopping  Avoid buying canned, premade, or processed foods. These  foods tend to be high in fat, sodium, and added sugar.  Shop around the outside edge of the grocery store. This includes fresh fruits and vegetables, bulk grains, fresh meats, and fresh dairy. Cooking  Use low-heat cooking methods, such as baking, instead of high-heat cooking methods like deep frying.  Cook using healthy oils, such as olive, canola, or sunflower oil.  Avoid cooking with butter, cream, or high-fat meats. Meal planning  Eat meals and snacks regularly, preferably at the same times every day. Avoid going long periods of time without eating.  Eat foods high in fiber, such as fresh fruits, vegetables, beans, and whole grains. Talk to your dietitian about how many servings of carbohydrates you can eat at each meal.  Eat 4-6 ounces of lean protein each day, such as lean meat, chicken, fish, eggs, or tofu. 1 ounce is equal to 1 ounce of meat, chicken, or fish, 1 egg, or 1/4 cup of tofu.  Eat some foods each day that contain healthy fats, such as avocado, nuts, seeds, and fish. Lifestyle   Check your blood glucose regularly.  Exercise at least 30 minutes 5 or more days each week, or as told by your health care provider.  Take medicines as told by your health care provider.  Do not use any products that contain nicotine or tobacco, such as cigarettes and e-cigarettes. If you need help quitting, ask your health care provider.  Work with a counselor or diabetes educator to identify strategies to manage stress and any emotional and social challenges. What are some questions to ask my health care provider?  Do I need to meet with a diabetes educator?  Do I need to meet with a dietitian?  What number can I call if I have questions?  When are the best times to check my blood glucose? Where to find more information:  American Diabetes Association: diabetes.org/food-and-fitness/food  Academy of Nutrition and Dietetics:  www.eatright.org/resources/health/diseases-and-conditions/diabetes  National Institute of Diabetes and Digestive and Kidney Diseases (NIH): www.niddk.nih.gov/health-information/diabetes/overview/diet-eating-physical-activity Summary  A healthy meal plan will help you control your blood glucose and maintain a healthy lifestyle.  Working with a diet and nutrition specialist (dietitian) can help you make a meal plan that is best for you.  Keep in mind that carbohydrates and alcohol have immediate effects on your blood glucose levels. It is important to count carbohydrates and to use alcohol carefully. This information is not intended to replace advice given to you by your health care provider. Make sure you discuss any questions you have with your health care provider. Document Released: 02/27/2005 Document Revised: 07/07/2016 Document Reviewed: 07/07/2016 Elsevier Interactive Patient Education  2018 [employer]  

## 2017-07-27 NOTE — Progress Notes (Signed)
Subjective:    Patient ID: Benjamin Hale, male    DOB: [birth year], 52 y.o.   MRN: [id number]  HPI  Benjamin Hale is here today for follow up of chronic medical problem.  Outpatient Encounter Medications as of 07/27/2017  Medication Sig  . aspirin EC 81 MG tablet Take 81 mg by mouth daily.  Marland Kitchen CINNAMON PO Take 1 capsule by mouth daily.  . diazepam (VALIUM) 5 MG tablet TAKE 1 TABLET EVERY 12 HOURS AS NEEDED FOR ANXIETY  . fenofibrate 160 MG tablet TAKE ONE (1) TABLET EACH DAY  . fluticasone (FLONASE) 50 MCG/ACT nasal spray Place 2 sprays into the nose daily as needed for rhinitis or allergies.  . hydrochlorothiazide (MICROZIDE) 12.5 MG capsule TAKE ONE (1) CAPSULE EACH DAY  . ibuprofen (ADVIL,MOTRIN) 200 MG tablet Take 400-800 mg by mouth every 6 (six) hours as needed (For headache or pain.).  Marland Kitchen lisinopril (PRINIVIL,ZESTRIL) 40 MG tablet TAKE ONE (1) TABLET EACH DAY  . metFORMIN (GLUCOPHAGE) 500 MG tablet TAKE ONE TABLET TWICE A DAY WITH FOOD  . Multiple Vitamin (MULTIVITAMIN WITH MINERALS) TABS tablet Take 1 tablet by mouth daily.  . nitroGLYCERIN (NITROSTAT) 0.4 MG SL tablet Place 1 tablet (0.4 mg total) under the tongue every 5 (five) minutes as needed for chest pain.  Marland Kitchen omega-3 acid ethyl esters (LOVAZA) 1 g capsule TAKE ONE CAPSULE BY MOUTH TWICE A DAY  . omeprazole (PRILOSEC) 20 MG capsule Take 1 capsule (20 mg total) by mouth daily as needed (for acid reflux). Acid reflux.  Glory Rosebush DELICA LANCETS 12W MISC EVERY DAY  . ONETOUCH VERIO test strip TWICE DAILY  . pravastatin (PRAVACHOL) 40 MG tablet TAKE ONE (1) TABLET EACH DAY  . Probiotic Product (PROBIOTIC PO) Take 1 capsule by mouth daily.  . Turmeric 500 MG TABS Take 500 mg by mouth daily.  . VENTOLIN HFA 108 (90 Base) MCG/ACT inhaler USE 2 PUFFS 4 TIMES DAILY AS NEEDED  . metoprolol tartrate (LOPRESSOR) 25 MG tablet Take 1 tablet (25 mg total) by mouth 2 (two) times daily.     1. Controlled type 2 diabetes mellitus  with diabetic nephropathy, without long-term current use of insulin ([place]) last hgba1c was 6.9%. Blood sugar shave been high- around 200- he does not check everyday.  2. Hyperlipidemia with target LDL less than 100  Not watching diet  3. Essential hypertension  No c/o chest pain SOB or headache. Does not check blood pressure at home  4. Gastroesophageal reflux disease without esophagitis  Has to take omeprazole daily to keep form having symptoms.    New complaints: None today   Social history: Patient has recently lost his job. Is taking a few months off to figure out what he wants to do. Has stressed him some.    Review of Systems  Constitutional: Negative for activity change and appetite change.  HENT: Negative.   Eyes: Negative for pain.  Respiratory: Negative for shortness of breath.   Cardiovascular: Negative for chest pain, palpitations and leg swelling.  Gastrointestinal: Negative for abdominal pain.  Endocrine: Negative for polydipsia.  Genitourinary: Negative.   Skin: Negative for rash.  Neurological: Negative for dizziness, weakness and headaches.  Hematological: Does not bruise/bleed easily.  Psychiatric/Behavioral: Negative.   All other systems reviewed and are negative.      Objective:   Physical Exam  Constitutional: He is oriented to person, place, and time. He appears well-developed and well-nourished.  HENT:  Head: Normocephalic.  Right Ear:  External ear normal.  Left Ear: External ear normal.  Nose: Nose normal.  Mouth/Throat: Oropharynx is clear and moist.  Eyes: EOM are normal. Pupils are equal, round, and reactive to light.  Neck: Normal range of motion. Neck supple. No JVD present. No thyromegaly present.  Cardiovascular: Normal rate, regular rhythm, normal heart sounds and intact distal pulses. Exam reveals no gallop and no friction rub.  No murmur heard. Pulmonary/Chest: Effort normal and breath sounds normal. No respiratory distress. He has no  wheezes. He has no rales. He exhibits no tenderness.  Abdominal: Soft. Bowel sounds are normal. He exhibits no mass. There is no tenderness.  Genitourinary: Prostate normal and penis normal.  Musculoskeletal: Normal range of motion. He exhibits no edema.  Lymphadenopathy:    He has no cervical adenopathy.  Neurological: He is alert and oriented to person, place, and time. No cranial nerve deficit.  Skin: Skin is warm and dry.  Psychiatric: He has a normal mood and affect. His behavior is normal. Judgment and thought content normal.   BP 135/79   Pulse (!) 48   Temp 97.6 F (36.4 C) (Oral)   Ht '5\' 10"'$  (1.778 m)   Wt 227 lb (103 kg)   BMI 32.57 kg/m   HGBA1c 7.8% today     Assessment & Plan:  1. Controlled type 2 diabetes mellitus with diabetic nephropathy, without long-term current use of insulin (HCC) Changed metformin form '500mg'$  bid to '1000mg'$  BID - Bayer DCA Hb A1c Waived- metFORMIN (GLUCOPHAGE) 500 MG tablet; Take 2 tablets (1,000 mg total) by mouth 2 (two) times daily with a meal.  Dispense: 180 tablet; Refill: 1   2. Hyperlipidemia with target LDL less than 100 Low fat diet - Lipid panel - fenofibrate 160 MG tablet; TAKE ONE (1) TABLET EACH DAY  Dispense: 90 tablet; Refill: 1 - omega-3 acid ethyl esters (LOVAZA) 1 g capsule; Take 1 capsule (1 g total) by mouth 2 (two) times daily.  Dispense: 180 capsule; Refill: 1  3. Essential hypertension Low sodium diet - CMP14+EGFR - lisinopril (PRINIVIL,ZESTRIL) 40 MG tablet; Take 1 tablet (40 mg total) by mouth daily.  Dispense: 90 tablet; Refill: 1 - hydrochlorothiazide (MICROZIDE) 12.5 MG capsule; TAKE ONE (1) CAPSULE EACH DAY  Dispense: 90 capsule; Refill: 1 - metoprolol tartrate (LOPRESSOR) 25 MG tablet; Take 1 tablet (25 mg total) by mouth 2 (two) times daily.  Dispense: 180 tablet; Refill: 1  4. Gastroesophageal reflux disease without esophagitis Avoid spicy foods Do not eat 2 hours prior to bedtime - omeprazole (PRILOSEC)  20 MG capsule; Take 1 capsule (20 mg total) by mouth daily as needed (for acid reflux). Acid reflux.  Dispense: 30 capsule; Refill: 5 - pravastatin (PRAVACHOL) 40 MG tablet; TAKE ONE (1) TABLET EACH DAY  Dispense: 90 tablet; Refill: 1    Labs pending Health maintenance reviewed Diet and exercise encouraged Continue all meds Follow up  In 3 months   [place], [name]

## 2017-07-28 LAB — CMP14+EGFR
ALT: 71 IU/L — ABNORMAL HIGH (ref 0–44)
AST: 46 IU/L — ABNORMAL HIGH (ref 0–40)
Albumin/Globulin Ratio: 1.8 (ref 1.2–2.2)
Albumin: 4.4 g/dL (ref 3.5–5.5)
Alkaline Phosphatase: 52 IU/L (ref 39–117)
BUN/Creatinine Ratio: 18 (ref 9–20)
BUN: 16 mg/dL (ref 6–24)
Bilirubin Total: 0.5 mg/dL (ref 0.0–1.2)
CO2: 22 mmol/L (ref 20–29)
Calcium: 9.2 mg/dL (ref 8.7–10.2)
Chloride: 103 mmol/L (ref 96–106)
Creatinine, Ser: 0.89 mg/dL (ref 0.76–1.27)
GFR calc Af Amer: 114 mL/min/{1.73_m2} (ref >59)
GFR calc non Af Amer: 99 mL/min/{1.73_m2} (ref >59)
Globulin, Total: 2.5 g/dL (ref 1.5–4.5)
Glucose: 203 mg/dL — ABNORMAL HIGH (ref 65–99)
Potassium: 4 mmol/L (ref 3.5–5.2)
Sodium: 141 mmol/L (ref 134–144)
Total Protein: 6.9 g/dL (ref 6.0–8.5)

## 2017-07-28 LAB — LIPID PANEL
CHOLESTEROL TOTAL: 107 mg/dL (ref 100–199)
Chol/HDL Ratio: 6.7 ratio — ABNORMAL HIGH (ref 0.0–5.0)
HDL: 16 mg/dL — ABNORMAL LOW (ref >39)
LDL Calculated: 21 mg/dL (ref 0–99)
Triglycerides: 349 mg/dL — ABNORMAL HIGH (ref 0–149)
VLDL Cholesterol Cal: 70 mg/dL — ABNORMAL HIGH (ref 5–40)

## 2017-09-03 ENCOUNTER — Ambulatory Visit (INDEPENDENT_AMBULATORY_CARE_PROVIDER_SITE_OTHER): Payer: Self-pay | Admitting: Nurse Practitioner

## 2017-09-03 ENCOUNTER — Encounter: Payer: Self-pay | Admitting: Nurse Practitioner

## 2017-09-03 VITALS — BP 127/81 | HR 61 | Temp 97.0°F | Ht 70.0 in | Wt 224.0 lb

## 2017-09-03 DIAGNOSIS — J029 Acute pharyngitis, unspecified: Secondary | ICD-10-CM

## 2017-09-03 LAB — CULTURE, GROUP A STREP

## 2017-09-03 LAB — RAPID STREP SCREEN (MED CTR MEBANE ONLY): Strep Gp A Ag, IA W/Reflex: NEGATIVE

## 2017-09-03 NOTE — Progress Notes (Signed)
   Subjective:    Patient ID: Benjamin Hale, male    DOB: 07/13/1965, 52 y.o.   MRN: [id number]  HPI Patient comes in today c/o sore throat. Started 2 days ago. Wife is under treatments for breast cancer and he wanted to make sure he doesn't have something that will make her sick.    Review of Systems  Constitutional: Positive for appetite change (dcreased). Negative for chills and fever.  HENT: Positive for congestion (slight), sore throat and trouble swallowing. Negative for voice change.   Respiratory: Negative for cough.   Cardiovascular: Negative.   Gastrointestinal: Negative.   Genitourinary: Negative.   Neurological: Negative.   Psychiatric/Behavioral: Negative.        Objective:   Physical Exam  Constitutional: He is oriented to person, place, and time. He appears well-developed and well-nourished. No distress.  HENT:  Right Ear: Hearing, tympanic membrane, external ear and ear canal normal.  Left Ear: Hearing, tympanic membrane, external ear and ear canal normal.  Nose: Mucosal edema and rhinorrhea present. Right sinus exhibits no maxillary sinus tenderness and no frontal sinus tenderness. Left sinus exhibits no maxillary sinus tenderness and no frontal sinus tenderness.  Mouth/Throat: Uvula is midline. Posterior oropharyngeal erythema (mild) present.  Neck: Normal range of motion. Neck supple.  Cardiovascular: Normal rate and regular rhythm.  Pulmonary/Chest: Effort normal and breath sounds normal.  Neurological: He is alert and oriented to person, place, and time.  Skin: Skin is warm.  Psychiatric: He has a normal mood and affect. His behavior is normal. Judgment and thought content normal.   BP 127/81   Pulse 61   Temp (!) 97 F (36.1 C) (Oral)   Ht 5\' 10"  (1.778 m)   Wt 224 lb (101.6 kg)   BMI 32.14 kg/m       Assessment & Plan:   1. Sore throat   2. Viral pharyngitis    Force fluids Motrin or tylenol OTC OTC decongestant Throat lozenges if  help New toothbrush in 3 days  Mary-Margaret Daphine DeutscherMartin, [name]

## 2017-09-03 NOTE — Addendum Note (Signed)
Addended by: Bennie PieriniMARTIN, MARY-MARGARET on: 09/03/2017 10:12 AM   Modules accepted: Level of Service

## 2017-09-03 NOTE — Patient Instructions (Signed)

## 2018-01-14 ENCOUNTER — Other Ambulatory Visit: Payer: Self-pay | Admitting: Family

## 2018-01-14 DIAGNOSIS — E119 Type 2 diabetes mellitus without complications: Secondary | ICD-10-CM

## 2018-01-15 ENCOUNTER — Ambulatory Visit: Payer: 59 | Admitting: Nurse Practitioner

## 2018-01-15 ENCOUNTER — Encounter: Payer: Self-pay | Admitting: Nurse Practitioner

## 2018-01-15 VITALS — BP 145/94 | HR 64 | Temp 97.5°F | Ht 70.0 in | Wt 227.0 lb

## 2018-01-15 DIAGNOSIS — I10 Essential (primary) hypertension: Secondary | ICD-10-CM | POA: Diagnosis not present

## 2018-01-15 DIAGNOSIS — E785 Hyperlipidemia, unspecified: Secondary | ICD-10-CM | POA: Diagnosis not present

## 2018-01-15 DIAGNOSIS — R5383 Other fatigue: Secondary | ICD-10-CM | POA: Diagnosis not present

## 2018-01-15 DIAGNOSIS — Z1212 Encounter for screening for malignant neoplasm of rectum: Secondary | ICD-10-CM

## 2018-01-15 DIAGNOSIS — Z1211 Encounter for screening for malignant neoplasm of colon: Secondary | ICD-10-CM

## 2018-01-15 DIAGNOSIS — E1121 Type 2 diabetes mellitus with diabetic nephropathy: Secondary | ICD-10-CM

## 2018-01-15 DIAGNOSIS — K219 Gastro-esophageal reflux disease without esophagitis: Secondary | ICD-10-CM | POA: Diagnosis not present

## 2018-01-15 LAB — BAYER DCA HB A1C WAIVED: HB A1C (BAYER DCA - WAIVED): 6.8 % (ref <7.0)

## 2018-01-15 MED ORDER — AMLODIPINE BESYLATE 10 MG PO TABS: 10.0000 mg | ORAL_TABLET | Freq: Every day | ORAL | 3 refills | Status: DC

## 2018-01-15 MED ORDER — METOPROLOL TARTRATE 25 MG PO TABS: 25.0000 mg | ORAL_TABLET | Freq: Two times a day (BID) | ORAL | 1 refills | Status: DC

## 2018-01-15 MED ORDER — LISINOPRIL 40 MG PO TABS: 40.0000 mg | ORAL_TABLET | Freq: Every day | ORAL | 1 refills | Status: DC

## 2018-01-15 MED ORDER — PRAVASTATIN SODIUM 40 MG PO TABS: ORAL_TABLET | ORAL | 1 refills | Status: DC

## 2018-01-15 MED ORDER — FENOFIBRATE 160 MG PO TABS: ORAL_TABLET | ORAL | 1 refills | Status: DC

## 2018-01-15 MED ORDER — OMEPRAZOLE 20 MG PO CPDR: 20.0000 mg | DELAYED_RELEASE_CAPSULE | Freq: Every day | ORAL | 5 refills | Status: DC | PRN

## 2018-01-15 NOTE — Progress Notes (Signed)
Subjective:    Patient ID: Benjamin Hale, male    DOB: January 14, 1966, 52 y.o.   MRN: [id number]   Chief Complaint: Medical management of chronic issues  HPI:  1. Essential hypertension  No c/o chest pain, sob or headache. Does  check blood pressures at home, and has been taking extra lisinopril. The hctz he is on causing cramping an dhe does not want to take. BP Readings from Last 3 Encounters:  09/03/17 127/81  07/27/17 135/79  03/30/17 125/81     2. Gastroesophageal reflux disease without esophagitis  Takes omeprazole daily or will have symptoms.  3. Controlled type 2 diabetes mellitus with diabetic nephropathy, without long-term current use of insulin ([place]) last hgab1c was 7.8%. Blood sugars have been running around 120-180.  4. Hyperlipidemia with target LDL less than 100  has not been watching diet and does very little exercise.    Outpatient Encounter Medications as of 01/15/2018  Medication Sig  . aspirin EC 81 MG tablet Take 81 mg by mouth daily.  Marland Kitchen CINNAMON PO Take 1 capsule by mouth daily.  . diazepam (VALIUM) 5 MG tablet TAKE 1 TABLET EVERY 12 HOURS AS NEEDED FOR ANXIETY  . fenofibrate 160 MG tablet TAKE ONE (1) TABLET EACH DAY  . fluticasone (FLONASE) 50 MCG/ACT nasal spray Place 2 sprays into the nose daily as needed for rhinitis or allergies.  . hydrochlorothiazide (MICROZIDE) 12.5 MG capsule TAKE ONE (1) CAPSULE EACH DAY  . ibuprofen (ADVIL,MOTRIN) 200 MG tablet Take 400-800 mg by mouth every 6 (six) hours as needed (For headache or pain.).  Marland Kitchen lisinopril (PRINIVIL,ZESTRIL) 40 MG tablet Take 1 tablet (40 mg total) by mouth daily.  . metFORMIN (GLUCOPHAGE) 500 MG tablet Take 2 tablets (1,000 mg total) by mouth 2 (two) times daily with a meal.  . metoprolol tartrate (LOPRESSOR) 25 MG tablet Take 1 tablet (25 mg total) by mouth 2 (two) times daily.  . Multiple Vitamin (MULTIVITAMIN WITH MINERALS) TABS tablet Take 1 tablet by mouth daily.  . nitroGLYCERIN (NITROSTAT)  0.4 MG SL tablet Place 1 tablet (0.4 mg total) under the tongue every 5 (five) minutes as needed for chest pain.  Marland Kitchen omega-3 acid ethyl esters (LOVAZA) 1 g capsule Take 1 capsule (1 g total) by mouth 2 (two) times daily.  Marland Kitchen omeprazole (PRILOSEC) 20 MG capsule Take 1 capsule (20 mg total) by mouth daily as needed (for acid reflux). Acid reflux.  Benjamin Hale DELICA LANCETS 03U MISC EVERY DAY  . ONETOUCH VERIO test strip TWICE DAILY  . pravastatin (PRAVACHOL) 40 MG tablet TAKE ONE (1) TABLET EACH DAY  . Probiotic Product (PROBIOTIC PO) Take 1 capsule by mouth daily.  . Turmeric 500 MG TABS Take 500 mg by mouth daily.  . VENTOLIN HFA 108 (90 Base) MCG/ACT inhaler USE 2 PUFFS 4 TIMES DAILY AS NEEDED       New complaints: None today  Social history: Lives with wife. Has son named Benjamin Hale who has autism  Review of Systems  Constitutional: Negative for activity change and appetite change.  HENT: Negative.   Eyes: Negative for pain.  Respiratory: Negative for shortness of breath.   Cardiovascular: Negative for chest pain, palpitations and leg swelling.  Gastrointestinal: Negative for abdominal pain.  Endocrine: Negative for polydipsia.  Genitourinary: Negative.   Skin: Negative for rash.  Neurological: Negative for dizziness, weakness and headaches.  Hematological: Does not bruise/bleed easily.  Psychiatric/Behavioral: Negative.   All other systems reviewed and are negative.  Objective:   Physical Exam  Constitutional: He is oriented to person, place, and time. He appears well-developed and well-nourished. No distress.  HENT:  Head: Normocephalic.  Nose: Nose normal.  Mouth/Throat: Oropharynx is clear and moist.  Eyes: Pupils are equal, round, and reactive to light. EOM are normal.  Neck: Normal range of motion and phonation normal. Neck supple. No JVD present. Carotid bruit is not present. No thyroid mass and no thyromegaly present.  Cardiovascular: Normal rate and regular rhythm.   Pulmonary/Chest: Effort normal and breath sounds normal. No respiratory distress.  Abdominal: Soft. Normal appearance, normal aorta and bowel sounds are normal. There is no tenderness.  Musculoskeletal: Normal range of motion.  Lymphadenopathy:    He has no cervical adenopathy.  Neurological: He is alert and oriented to person, place, and time.  Skin: Skin is warm and dry.  Psychiatric: He has a normal mood and affect. His behavior is normal. Judgment and thought content normal.   BP (!) 145/94   Pulse 64   Temp (!) 97.5 F (36.4 C) (Oral)   Ht _0  (1.778 m)   Wt 227 lb (103 kg)   BMI 32.57 kg/m   hgba1c 6.8%      Assessment & Plan:  Benjamin Hale comes in today with chief complaint of Medical Management of Chronic Issues   Diagnosis and orders addressed:  1. Essential hypertension Low sodium diet - CMP14+EGFR - lisinopril (PRINIVIL,ZESTRIL) 40 MG tablet; Take 1 tablet (40 mg total) by mouth daily.  Dispense: 90 tablet; Refill: 1 - amLODipine (NORVASC) 10 MG tablet; Take 1 tablet (10 mg total) by mouth daily.  Dispense: 90 tablet; Refill: 3 - metoprolol tartrate (LOPRESSOR) 25 MG tablet; Take 1 tablet (25 mg total) by mouth 2 (two) times daily.  Dispense: 180 tablet; Refill: 1  2. Gastroesophageal reflux disease without esophagitis Avoid spicy foods Do not eat 2 hours prior to bedtime - omeprazole (PRILOSEC) 20 MG capsule; Take 1 capsule (20 mg total) by mouth daily as needed (for acid reflux). Acid reflux.  Dispense: 30 capsule; Refill: 5 - pravastatin (PRAVACHOL) 40 MG tablet; TAKE ONE (1) TABLET EACH DAY  Dispense: 90 tablet; Refill: 1  3. Controlled type 2 diabetes mellitus with diabetic nephropathy, without long-term current use of insulin ([facility]CC) Continue to watch carbs in diet - Bayer DCA Hb A1c Waived - Microalbumin / creatinine urine ratio  4. Hyperlipidemia with target LDL less than 100 Low fat diet - Lipid panel - fenofibrate 160 MG tablet; TAKE ONE  (1) TABLET EACH DAY  Dispense: 90 tablet; Refill: 1  5. Fatigue, unspecified type - Testosterone,Free and Total  6. Encounter for colorectal cancer screening - Cologuard     Labs pending Health Maintenance reviewed Diet and exercise encouraged  Follow up plan: 3 months   Mary-Margaret Hassell Done, [name]

## 2018-01-15 NOTE — Patient Instructions (Signed)
DASH Eating Plan DASH stands for "Dietary Approaches to Stop Hypertension." The DASH eating plan is a healthy eating plan that has been shown to reduce high blood pressure (hypertension). It may also reduce your risk for type 2 diabetes, heart disease, and stroke. The DASH eating plan may also help with weight loss. What are tips for following this plan? General guidelines  Avoid eating more than 2,300 mg (milligrams) of salt (sodium) a day. If you have hypertension, you may need to reduce your sodium intake to 1,500 mg a day.  Limit alcohol intake to no more than 1 drink a day for nonpregnant women and 2 drinks a day for men. One drink equals 12 oz of beer, 5 oz of wine, or 1 oz of hard liquor.  Work with your health care provider to maintain a healthy body weight or to lose weight. Ask what an ideal weight is for you.  Get at least 30 minutes of exercise that causes your heart to beat faster (aerobic exercise) most days of the week. Activities may include walking, swimming, or biking.  Work with your health care provider or diet and nutrition specialist (dietitian) to adjust your eating plan to your individual calorie needs. Reading food labels  Check food labels for the amount of sodium per serving. Choose foods with less than 5 percent of the Daily Value of sodium. Generally, foods with less than 300 mg of sodium per serving fit into this eating plan.  To find whole grains, look for the word "whole" as the first word in the ingredient list. Shopping  Buy products labeled as "low-sodium" or "no salt added."  Buy fresh foods. Avoid canned foods and premade or frozen meals. Cooking  Avoid adding salt when cooking. Use salt-free seasonings or herbs instead of table salt or sea salt. Check with your health care provider or pharmacist before using salt substitutes.  Do not fry foods. Cook foods using healthy methods such as baking, boiling, grilling, and broiling instead.  Cook with  heart-healthy oils, such as olive, canola, soybean, or sunflower oil. Meal planning   Eat a balanced diet that includes: ? 5 or more servings of fruits and vegetables each day. At each meal, try to fill half of your plate with fruits and vegetables. ? Up to 6-8 servings of whole grains each day. ? Less than 6 oz of lean meat, poultry, or fish each day. A 3-oz serving of meat is about the same size as a deck of cards. One egg equals 1 oz. ? 2 servings of low-fat dairy each day. ? A serving of nuts, seeds, or beans 5 times each week. ? Heart-healthy fats. Healthy fats called Omega-3 fatty acids are found in foods such as flaxseeds and coldwater fish, like sardines, salmon, and mackerel.  Limit how much you eat of the following: ? Canned or prepackaged foods. ? Food that is high in trans fat, such as fried foods. ? Food that is high in saturated fat, such as fatty meat. ? Sweets, desserts, sugary drinks, and other foods with added sugar. ? Full-fat dairy products.  Do not salt foods before eating.  Try to eat at least 2 vegetarian meals each week.  Eat more home-cooked food and less restaurant, buffet, and fast food.  When eating at a restaurant, ask that your food be prepared with less salt or no salt, if possible. What foods are recommended? The items listed may not be a complete list. Talk with your dietitian about what   dietary choices are best for you. Grains Whole-grain or whole-wheat bread. Whole-grain or whole-wheat pasta. Brown rice. Oatmeal. Quinoa. Bulgur. Whole-grain and low-sodium cereals. Pita bread. Low-fat, low-sodium crackers. Whole-wheat flour tortillas. Vegetables Fresh or frozen vegetables (raw, steamed, roasted, or grilled). Low-sodium or reduced-sodium tomato and vegetable juice. Low-sodium or reduced-sodium tomato sauce and tomato paste. Low-sodium or reduced-sodium canned vegetables. Fruits All fresh, dried, or frozen fruit. Canned fruit in natural juice (without  added sugar). Meat and other protein foods Skinless chicken or turkey. Ground chicken or turkey. Pork with fat trimmed off. Fish and seafood. Egg whites. Dried beans, peas, or lentils. Unsalted nuts, nut butters, and seeds. Unsalted canned beans. Lean cuts of beef with fat trimmed off. Low-sodium, lean deli meat. Dairy Low-fat (1%) or fat-free (skim) milk. Fat-free, low-fat, or reduced-fat cheeses. Nonfat, low-sodium ricotta or cottage cheese. Low-fat or nonfat yogurt. Low-fat, low-sodium cheese. Fats and oils Soft margarine without trans fats. Vegetable oil. Low-fat, reduced-fat, or light mayonnaise and salad dressings (reduced-sodium). Canola, safflower, olive, soybean, and sunflower oils. Avocado. Seasoning and other foods Herbs. Spices. Seasoning mixes without salt. Unsalted popcorn and pretzels. Fat-free sweets. What foods are not recommended? The items listed may not be a complete list. Talk with your dietitian about what dietary choices are best for you. Grains Baked goods made with fat, such as croissants, muffins, or some breads. Dry pasta or rice meal packs. Vegetables Creamed or fried vegetables. Vegetables in a cheese sauce. Regular canned vegetables (not low-sodium or reduced-sodium). Regular canned tomato sauce and paste (not low-sodium or reduced-sodium). Regular tomato and vegetable juice (not low-sodium or reduced-sodium). Pickles. Olives. Fruits Canned fruit in a light or heavy syrup. Fried fruit. Fruit in cream or butter sauce. Meat and other protein foods Fatty cuts of meat. Ribs. Fried meat. Bacon. Sausage. Bologna and other processed lunch meats. Salami. Fatback. Hotdogs. Bratwurst. Salted nuts and seeds. Canned beans with added salt. Canned or smoked fish. Whole eggs or egg yolks. Chicken or turkey with skin. Dairy Whole or 2% milk, cream, and half-and-half. Whole or full-fat cream cheese. Whole-fat or sweetened yogurt. Full-fat cheese. Nondairy creamers. Whipped toppings.  Processed cheese and cheese spreads. Fats and oils Butter. Stick margarine. Lard. Shortening. Ghee. Bacon fat. Tropical oils, such as coconut, palm kernel, or palm oil. Seasoning and other foods Salted popcorn and pretzels. Onion salt, garlic salt, seasoned salt, table salt, and sea salt. Worcestershire sauce. Tartar sauce. Barbecue sauce. Teriyaki sauce. Soy sauce, including reduced-sodium. Steak sauce. Canned and packaged gravies. Fish sauce. Oyster sauce. Cocktail sauce. Horseradish that you find on the shelf. Ketchup. Mustard. Meat flavorings and tenderizers. Bouillon cubes. Hot sauce and Tabasco sauce. Premade or packaged marinades. Premade or packaged taco seasonings. Relishes. Regular salad dressings. Where to find more information:  National Heart, Lung, and Blood Institute: www.nhlbi.nih.gov  American Heart Association: www.heart.org Summary  The DASH eating plan is a healthy eating plan that has been shown to reduce high blood pressure (hypertension). It may also reduce your risk for type 2 diabetes, heart disease, and stroke.  With the DASH eating plan, you should limit salt (sodium) intake to 2,300 mg a day. If you have hypertension, you may need to reduce your sodium intake to 1,500 mg a day.  When on the DASH eating plan, aim to eat more fresh fruits and vegetables, whole grains, lean proteins, low-fat dairy, and heart-healthy fats.  Work with your health care provider or diet and nutrition specialist (dietitian) to adjust your eating plan to your individual   calorie needs. This information is not intended to replace advice given to you by your health care provider. Make sure you discuss any questions you have with your health care provider. Document Released: 05/22/2011 Document Revised: 05/26/2016 Document Reviewed: 05/26/2016 Elsevier Interactive Patient Education  2018 [employer]  

## 2018-01-16 LAB — CMP14+EGFR
ALT: 62 IU/L — ABNORMAL HIGH (ref 0–44)
AST: 46 IU/L — ABNORMAL HIGH (ref 0–40)
Albumin/Globulin Ratio: 1.6 (ref 1.2–2.2)
Albumin: 4.3 g/dL (ref 3.5–5.5)
Alkaline Phosphatase: 48 IU/L (ref 39–117)
BUN / CREAT RATIO: 19 (ref 9–20)
BUN: 18 mg/dL (ref 6–24)
Bilirubin Total: 0.6 mg/dL (ref 0.0–1.2)
CALCIUM: 9.3 mg/dL (ref 8.7–10.2)
CO2: 23 mmol/L (ref 20–29)
CREATININE: 0.94 mg/dL (ref 0.76–1.27)
Chloride: 100 mmol/L (ref 96–106)
GFR, EST AFRICAN AMERICAN: 108 mL/min/{1.73_m2} (ref >59)
GFR, EST NON AFRICAN AMERICAN: 93 mL/min/{1.73_m2} (ref >59)
GLOBULIN, TOTAL: 2.7 g/dL (ref 1.5–4.5)
GLUCOSE: 209 mg/dL — AB (ref 65–99)
Potassium: 4 mmol/L (ref 3.5–5.2)
Sodium: 138 mmol/L (ref 134–144)
Total Protein: 7 g/dL (ref 6.0–8.5)

## 2018-01-16 LAB — LIPID PANEL
Chol/HDL Ratio: 5.2 ratio — ABNORMAL HIGH (ref 0.0–5.0)
Cholesterol, Total: 104 mg/dL (ref 100–199)
HDL: 20 mg/dL — ABNORMAL LOW (ref >39)
LDL CALC: 27 mg/dL (ref 0–99)
Triglycerides: 286 mg/dL — ABNORMAL HIGH (ref 0–149)
VLDL CHOLESTEROL CAL: 57 mg/dL — AB (ref 5–40)

## 2018-01-16 LAB — TESTOSTERONE,FREE AND TOTAL
TESTOSTERONE FREE: 9.5 pg/mL (ref 7.2–24.0)
Testosterone: 313 ng/dL (ref 264–916)

## 2018-01-27 DIAGNOSIS — Z1212 Encounter for screening for malignant neoplasm of rectum: Secondary | ICD-10-CM | POA: Diagnosis not present

## 2018-01-27 DIAGNOSIS — Z1211 Encounter for screening for malignant neoplasm of colon: Secondary | ICD-10-CM | POA: Diagnosis not present

## 2018-02-21 ENCOUNTER — Other Ambulatory Visit: Payer: Self-pay | Admitting: Family

## 2018-02-21 DIAGNOSIS — E119 Type 2 diabetes mellitus without complications: Secondary | ICD-10-CM

## 2018-02-26 ENCOUNTER — Telehealth: Payer: Self-pay | Admitting: Nurse Practitioner

## 2018-02-26 NOTE — Telephone Encounter (Signed)
Patient states he was told to take metformin 500mg  2 twice a day. Please advise

## 2018-02-27 MED ORDER — METFORMIN HCL 1000 MG PO TABS: 1000.0000 mg | ORAL_TABLET | Freq: Two times a day (BID) | ORAL | 1 refills | Status: DC

## 2018-02-27 NOTE — Telephone Encounter (Signed)
Metformin rx changed to 1000mg  BID- new rx sen tto pharmacy

## 2018-02-27 NOTE — Telephone Encounter (Signed)
Patient aware.

## 2018-03-05 ENCOUNTER — Ambulatory Visit: Payer: 59 | Admitting: Family Medicine

## 2018-03-05 ENCOUNTER — Encounter: Payer: Self-pay | Admitting: Family Medicine

## 2018-03-05 ENCOUNTER — Ambulatory Visit (INDEPENDENT_AMBULATORY_CARE_PROVIDER_SITE_OTHER): Payer: 59

## 2018-03-05 VITALS — BP 135/88 | HR 65 | Temp 97.4°F | Ht 70.0 in | Wt 228.0 lb

## 2018-03-05 DIAGNOSIS — S63502A Unspecified sprain of left wrist, initial encounter: Secondary | ICD-10-CM

## 2018-03-05 DIAGNOSIS — M25532 Pain in left wrist: Secondary | ICD-10-CM

## 2018-03-05 DIAGNOSIS — M19032 Primary osteoarthritis, left wrist: Secondary | ICD-10-CM | POA: Diagnosis not present

## 2018-03-05 MED ORDER — NAPROXEN 500 MG PO TABS: 500.0000 mg | ORAL_TABLET | Freq: Two times a day (BID) | ORAL | 1 refills | Status: DC

## 2018-03-05 NOTE — Progress Notes (Addendum)
Subjective:    Patient ID: Benjamin Hale, male    DOB: 1965/12/17, 52 y.o.   MRN: [id number]  Chief Complaint:  Left wrist pain (fall through a grate 2 days ago)   HPI: Benjamin Hale is a 52 y.o. male presenting on 03/05/2018 for Left wrist pain (fall through a grate 2 days ago)  Pt presents today with complaints of left wrist pain. Pt states he fell into a hole two days ago injuring his left wrist. Pt states he is unsure if he landed on an outstretched hand. Pt states he has swelling and pain in his left wrist. States this pain is aching all of the time and sharp and shooting when he attempts to lift or grab anything. States the pain is 5/10. He has taken Advil without relief of the pain. States he has only taken the Advil a few times. Pt denies numbness, tingling, loss of function, elbow, or hand pain.   1. Left wrist sprain, initial encounter   2. Wrist pain, acute, left      Relevant past medical, surgical, family and social history reviewed and updated as indicated. Interim medical history since our last visit reviewed. Allergies and medications reviewed and updated. DATA REVIEWED: CHART IN EPIC  Family History reviewed for pertinent findings.  Past Medical History:  Diagnosis Date  . Anginal pain ([place])   . Arthritis    "neck" (12/23/2012)  . Asthma   . Atrial flutter ([place])    a. s/p cervical spine surgery 4/14 => s/p DCCV;  b.  Echo 09/2012:  Mild LVH, EF 60-65%, Gr 2 DD.c. s/p atrial flutter ablation 12/23/2012 by Dr Rayann Heman  . Cervical disc disease    s/p cervical spine surgery 4/14  . COPD (chronic obstructive pulmonary disease) ([place])   . Diabetes mellitus without complication ([place])   . Exertional shortness of breath   . GERD (gastroesophageal reflux disease)   . Headache    "weekly" (12/23/2012)  . HTN (hypertension)   . Hyperlipidemia   . Hypogonadism male   . Migraines    "once q 3-4 months" (12/23/2012)    Past Surgical History:  Procedure Laterality  Date  . ANTERIOR CERVICAL DECOMP/DISCECTOMY FUSION N/A 09/22/2012   Procedure: ANTERIOR CERVICAL DECOMPRESSION/DISCECTOMY FUSION 2 LEVELS;  Surgeon: Floyce Stakes, MD;  Location: [facility];  Service: Neurosurgery;  Laterality: N/A;  Cervical five-six Cervical six-seven Anterior cervical decompression/diskectomy/fusion  . ATRIAL FLUTTER ABLATION  12/23/2012   CTI ablation by Dr Rayann Heman  . ATRIAL FLUTTER ABLATION N/A 12/23/2012   Procedure: ATRIAL FLUTTER ABLATION;  Surgeon: Thompson Grayer, MD;  Location: [facility] CATH LAB;  Service: Cardiovascular;  Laterality: N/A;  . CARDIAC CATHETERIZATION N/A 11/16/2015   Procedure: Left Heart Cath and Coronary Angiography;  Surgeon: Leonie Man, MD;  Location: [facility] CV LAB;  Service: Cardiovascular;  Laterality: N/A;  . CARDIOVERSION N/A 09/23/2012   Procedure: CARDIOVERSION;  Surgeon: Renella Cunas, MD;  Location: [facility];  Service: Cardiovascular;  Laterality: N/A;  . TONSILLECTOMY  1973  . TREATMENT FISTULA ANAL  ~ 2007  . WISDOM TOOTH EXTRACTION      Social History   Socioeconomic History  . Marital status: Married    Spouse name: Not on file  . Number of children: 1  . Years of education: Not on file  . Highest education level: Not on file  Occupational History  . Not on file  Social Needs  . Financial resource strain: Not  on file  . Food insecurity:    Worry: Not on file    Inability: Not on file  . Transportation needs:    Medical: Not on file    Non-medical: Not on file  Tobacco Use  . Smoking status: Former Smoker    Packs/day: 1.50    Years: 22.00    Pack years: 33.00    Types: Cigarettes    Last attempt to quit: 06/16/2005    Years since quitting: 12.7  . Smokeless tobacco: Never Used  Substance and Sexual Activity  . Alcohol use: Yes    Comment: 12/23/2012 previously drank heavily - quit that 8 yrs ago; might have a beer q 5 yr"  . Drug use: No  . Sexual activity: Yes  Lifestyle  . Physical activity:    Days per week: Not  on file    Minutes per session: Not on file  . Stress: Not on file  Relationships  . Social connections:    Talks on phone: Not on file    Gets together: Not on file    Attends religious service: Not on file    Active member of club or organization: Not on file    Attends meetings of clubs or organizations: Not on file    Relationship status: Not on file  . Intimate partner violence:    Fear of current or ex partner: Not on file    Emotionally abused: Not on file    Physically abused: Not on file    Forced sexual activity: Not on file  Other Topics Concern  . Not on file  Social History Narrative   Lives in [place] with his wife and 71 yr old son.  Works as copy Geneticist, molecular.  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    Outpatient Encounter Medications as of 03/05/2018  Medication Sig  . amLODipine (NORVASC) 10 MG tablet Take 1 tablet (10 mg total) by mouth daily.  Marland Kitchen aspirin EC 81 MG tablet Take 81 mg by mouth daily.  Marland Kitchen CINNAMON PO Take 1 capsule by mouth daily.  . diazepam (VALIUM) 5 MG tablet TAKE 1 TABLET EVERY 12 HOURS AS NEEDED FOR ANXIETY  . fenofibrate 160 MG tablet TAKE ONE (1) TABLET EACH DAY  . fluticasone (FLONASE) 50 MCG/ACT nasal spray Place 2 sprays into the nose daily as needed for rhinitis or allergies.  Marland Kitchen ibuprofen (ADVIL,MOTRIN) 200 MG tablet Take 400-800 mg by mouth every 6 (six) hours as needed (For headache or pain.).  Marland Kitchen lisinopril (PRINIVIL,ZESTRIL) 40 MG tablet Take 1 tablet (40 mg total) by mouth daily.  . metFORMIN (GLUCOPHAGE) 1000 MG tablet Take 1 tablet (1,000 mg total) by mouth 2 (two) times daily with a meal.  . metoprolol tartrate (LOPRESSOR) 25 MG tablet Take 1 tablet (25 mg total) by mouth 2 (two) times daily.  . Multiple Vitamin (MULTIVITAMIN WITH MINERALS) TABS tablet Take 1 tablet by mouth daily.  . nitroGLYCERIN (NITROSTAT) 0.4 MG SL tablet Place 1 tablet (0.4 mg total) under the tongue every 5 (five) minutes as needed for chest pain.  Marland Kitchen omega-3 acid ethyl  esters (LOVAZA) 1 g capsule Take 1 capsule (1 g total) by mouth 2 (two) times daily.  Marland Kitchen omeprazole (PRILOSEC) 20 MG capsule Take 1 capsule (20 mg total) by mouth daily as needed (for acid reflux). Acid reflux.  Glory Rosebush DELICA LANCETS 99I MISC EVERY DAY  . ONETOUCH VERIO test strip TWICE DAILY  . pravastatin (PRAVACHOL) 40 MG tablet TAKE ONE (1) TABLET  EACH DAY  . Probiotic Product (PROBIOTIC PO) Take 1 capsule by mouth daily.  . Turmeric 500 MG TABS Take 500 mg by mouth daily.  . VENTOLIN HFA 108 (90 Base) MCG/ACT inhaler USE 2 PUFFS 4 TIMES DAILY AS NEEDED  . naproxen (NAPROSYN) 500 MG tablet Take 1 tablet (500 mg total) by mouth 2 (two) times daily with a meal.   No facility-administered encounter medications on file as of 03/05/2018.     No Known Allergies  Review of Systems  Musculoskeletal: Positive for arthralgias (left wrist) and joint swelling (left wrist).  Skin: Negative for color change.  Neurological: Negative for weakness and numbness.  All other systems reviewed and are negative.       Objective:    BP 135/88   Pulse 65   Temp (!) 97.4 F (36.3 C) (Oral)   Ht '5\' 10"'$  (1.778 m)   Wt 228 lb (103.4 kg)   BMI 32.71 kg/m    Wt Readings from Last 3 Encounters:  03/05/18 228 lb (103.4 kg)  01/15/18 227 lb (103 kg)  09/03/17 224 lb (101.6 kg)    Physical Exam  Constitutional: He is oriented to person, place, and time. He appears well-developed and well-nourished.  Cardiovascular: Normal rate, regular rhythm, normal heart sounds and intact distal pulses.  Pulmonary/Chest: Effort normal and breath sounds normal.  Musculoskeletal: He exhibits edema (left wrist) and tenderness (Left anterior wrist. Negative snuff box tenderness. ). He exhibits no deformity.       Left wrist: He exhibits tenderness, bony tenderness and swelling. He exhibits normal range of motion, no effusion, no crepitus, no deformity and no laceration.  Left wrist: Pain with flexion, extension, and  ulnar and radial deviation. Intact distal pulses, cap refill, and sensation. Normal grip strength.   Neurological: He is alert and oriented to person, place, and time.  Skin: Skin is warm and dry. Capillary refill takes less than 2 seconds.  Psychiatric: He has a normal mood and affect. His behavior is normal. Judgment and thought content normal.  Nursing note and vitals reviewed.   Results for orders placed or performed in visit on 01/15/18  Bayer DCA Hb A1c Waived  Result Value Ref Range   HB A1C (BAYER DCA - WAIVED) 6.8 <7.0 %  CMP14+EGFR  Result Value Ref Range   Glucose 209 (H) 65 - 99 mg/dL   BUN 18 6 - 24 mg/dL   Creatinine, Ser 0.94 0.76 - 1.27 mg/dL   GFR calc non Af Amer 93 >59 mL/min/1.73   GFR calc Af Amer 108 >59 mL/min/1.73   BUN/Creatinine Ratio 19 9 - 20   Sodium 138 134 - 144 mmol/L   Potassium 4.0 3.5 - 5.2 mmol/L   Chloride 100 96 - 106 mmol/L   CO2 23 20 - 29 mmol/L   Calcium 9.3 8.7 - 10.2 mg/dL   Total Protein 7.0 6.0 - 8.5 g/dL   Albumin 4.3 3.5 - 5.5 g/dL   Globulin, Total 2.7 1.5 - 4.5 g/dL   Albumin/Globulin Ratio 1.6 1.2 - 2.2   Bilirubin Total 0.6 0.0 - 1.2 mg/dL   Alkaline Phosphatase 48 39 - 117 IU/L   AST 46 (H) 0 - 40 IU/L   ALT 62 (H) 0 - 44 IU/L  Lipid panel  Result Value Ref Range   Cholesterol, Total 104 100 - 199 mg/dL   Triglycerides 286 (H) 0 - 149 mg/dL   HDL 20 (L) >39 mg/dL   VLDL Cholesterol Cal 57 (  H) 5 - 40 mg/dL   LDL Calculated 27 0 - 99 mg/dL   Chol/HDL Ratio 5.2 (H) 0.0 - 5.0 ratio  Testosterone,Free and Total  Result Value Ref Range   Testosterone 313 264 - 916 ng/dL   Testosterone, Free 9.5 7.2 - 24.0 pg/mL     Left wrist x-ray without acute findings per radiology report. Pt aware.  Assessment & Plan:   1. Wrist pain, acute, left - DG Wrist Complete Left  2. Left wrist sprain, initial encounter Thumb spica splint, may remove and exercise wrist. May discontinue use of splint when asymptomatic. Activity as tolerated,  avoid heavy lifting. RICE. Medications as prescribed, take with food and avoid use of other NSAIDS while taking naproxen.  - naproxen (NAPROSYN) 500 MG tablet; Take 1 tablet (500 mg total) by mouth 2 (two) times daily with a meal.  Dispense: 60 tablet; Refill: 1   Continue all other maintenance medications.  Follow up plan: Return if symptoms worsen or fail to improve. Follow-up in 2 weeks if you continue to have pain.   Educational handout given for RICE, wrist sprain  The above assessment and management plan was discussed with the patient. The patient verbalized understanding of and has agreed to the management plan. Patient is aware to call the clinic if symptoms persist or worsen. Patient is aware when to return to the clinic for a follow-up visit. Patient educated on when it is appropriate to go to the emergency department.   Monia Pouch, [name]-C [facility] [phone]

## 2018-03-05 NOTE — Patient Instructions (Signed)
RICE for Routine Care of Injuries Many injuries can be cared for using rest, ice, compression, and elevation (RICE therapy). Using RICE therapy can help to lessen pain and swelling. It can help your body to heal. Rest Reduce your normal activities and avoid using the injured part of your body. You can go back to your normal activities when you feel okay and your doctor says it is okay. Ice Do not put ice on your bare skin.  Put ice in a plastic bag.  Place a towel between your skin and the bag.  Leave the ice on for 20 minutes, 2-3 times a day.  Do this for as long as told by your doctor. Compression Compression means putting pressure on the injured area. This can be done with an elastic bandage. If an elastic bandage has been applied:  Remove and reapply the bandage every 3-4 hours or as told by your doctor.  Make sure the bandage is not wrapped too tight. Wrap the bandage more loosely if part of your body beyond the bandage is blue, swollen, cold, painful, or loses feeling (numb).  See your doctor if the bandage seems to make your problems worse.  Elevation Elevation means keeping the injured area raised. Raise the injured area above your heart or the center of your chest if you can. When should I get help? You should get help if:  You keep having pain and swelling.  Your symptoms get worse.  Get help right away if: You should get help right away if:  You have sudden bad pain at or below the area of your injury.  You have redness or more swelling around your injury.  You have tingling or numbness at or below the injury that does not go away when you take off the bandage.  This information is not intended to replace advice given to you by your health care provider. Make sure you discuss any questions you have with your health care provider. Document Released: 11/19/2007 Document Revised: 04/29/2016 Document Reviewed: 05/10/2014 Elsevier Interactive Patient Education  2017  Elsevier Inc[employer] Wrist Sprain, Adult A wrist sprain is a stretch or tear in the strong, fibrous tissues (ligaments) that connect your wrist bones. There are three types of wrist sprains:  Grade 1. In this type of sprain, the ligament is stretched more than normal.  Grade 2. In this type of sprain, the ligament is partially torn. You may be able to move your wrist, but not very much.  Grade 3. In this type of sprain, the ligament or muscle is completely torn. You may find it difficult or extremely painful to move your wrist even a little.  What are the causes? A wrist sprain can be caused by using the wrist too much during sports, exercise, or at work. It can also happen with a fall or during an accident. What increases the risk? This condition is more likely to occur in people:  With a previous wrist or arm injury.  With poor wrist strength and flexibility.  Who play contact sports, such as football or soccer.  Who play sports that may result in a fall, such as skateboarding, biking, skiing, or snowboarding.  Who do not exercise regularly.  Who use exercise equipment that does not fit well.  What are the signs or symptoms? Symptoms of this condition include:  Pain in the wrist, arm, or hand.  Swelling or bruised skin near the wrist, hand, or arm. The skin may look yellow or kind of  blue.  Stiffness or trouble moving the hand.  Hearing a pop or feeling a tear at the time of the injury.  A warm feeling in the skin around the wrist.  How is this diagnosed? This condition is diagnosed with a physical exam. Sometimes an X-ray is taken to make sure a bone did not break. If your health care provider thinks that you tore a ligament, he or she may order an MRI of your wrist. How is this treated? This condition is treated by resting and applying ice to your wrist. Additional treatment may include:  Medicine for pain and inflammation.  A splint to keep your wrist still  (immobilized).  Exercises to strengthen and stretch your wrist.  Surgery. This may be done if the ligament is completely torn.  Follow these instructions at home: If you have a splint:   Do not put pressure on any part of the splint until it is fully hardened. This may take several hours.  Wear the splint as told by your health care provider. Remove it only as told by your health care provider.  Loosen the splint if your fingers tingle, become numb, or turn cold and blue.  If your splint is not waterproof: ? Do not let it get wet. ? Cover it with a watertight covering when you take a bath or a shower.  Keep the splint clean. Managing pain, stiffness, and swelling   If directed, put ice on the injured area. ? If you have a removable splint, remove it as told by your health care provider. ? Put ice in a plastic bag. ? Place a towel between your skin and the bag or between the splint and the bag. ? Leave the ice on for 20 minutes, 2-3 times per day.  Move your fingers often to avoid stiffness and to lessen swelling.  Raise (elevate) the injured area above the level of your heart while you are sitting or lying down. Activity  Rest your wrist. Do not do things that cause pain.  Return to your normal activities as told by your health care provider. Ask your health care provider what activities are safe for you.  Do exercises as told by your health care provider. General instructions  Take over-the-counter and prescription medicines only as told by your health care provider.  Do not use any products that contain nicotine or tobacco, such as cigarettes and e-cigarettes. These can delay healing. If you need help quitting, ask your health care provider.  Ask your health care provider when it is safe to drive if you have a splint.  Keep all follow-up visits as told by your health care provider. This is important. Contact a health care provider if:  Your pain, bruising, or  swelling gets worse.  Your skin becomes red, gets a rash, or has open sores.  Your pain does not get better or it gets worse. Get help right away if:  You have a new or sudden sharp pain in the hand, arm, or wrist.  You have tingling or numbness in your hand.  Your fingers turn white, very red, or cold and blue.  You cannot move your fingers. This information is not intended to replace advice given to you by your health care provider. Make sure you discuss any questions you have with your health care provider. Document Released: 02/03/2014 Document Revised: 12/29/2015 Document Reviewed: 12/20/2015 Elsevier Interactive Patient Education  [employer]2018 Elsevier Inc.

## 2018-03-24 LAB — COLOGUARD: COLOGUARD: NEGATIVE

## 2018-04-15 ENCOUNTER — Telehealth: Payer: Self-pay | Admitting: Nurse Practitioner

## 2018-04-15 DIAGNOSIS — E1121 Type 2 diabetes mellitus with diabetic nephropathy: Secondary | ICD-10-CM

## 2018-04-15 NOTE — Telephone Encounter (Signed)
Yeah just have hime remind me because not sure why he wants done.

## 2018-04-15 NOTE — Telephone Encounter (Signed)
Patient wants to know if he can have a lactic acid drawn when he does labs.

## 2018-04-15 NOTE — Telephone Encounter (Signed)
Patient would like to have this checked because of his ongoing muscle cramps, fatigue, weakness and irregular heartbeat.  He has been googling symptoms and is concerned.

## 2018-04-16 ENCOUNTER — Other Ambulatory Visit: Payer: 59

## 2018-04-16 DIAGNOSIS — E785 Hyperlipidemia, unspecified: Secondary | ICD-10-CM | POA: Diagnosis not present

## 2018-04-16 DIAGNOSIS — I10 Essential (primary) hypertension: Secondary | ICD-10-CM | POA: Diagnosis not present

## 2018-04-16 DIAGNOSIS — E1121 Type 2 diabetes mellitus with diabetic nephropathy: Secondary | ICD-10-CM

## 2018-04-16 DIAGNOSIS — R252 Cramp and spasm: Secondary | ICD-10-CM

## 2018-04-16 LAB — BAYER DCA HB A1C WAIVED: HB A1C (BAYER DCA - WAIVED): 7.3 % — ABNORMAL HIGH (ref <7.0)

## 2018-04-16 NOTE — Telephone Encounter (Signed)
Pt wants to make sure that the test for lactic acidoses is added to the blood that he left this morning

## 2018-04-18 LAB — SPECIMEN STATUS REPORT

## 2018-04-19 LAB — LIPID PANEL
CHOLESTEROL TOTAL: 115 mg/dL (ref 100–199)
Chol/HDL Ratio: 5.5 ratio — ABNORMAL HIGH (ref 0.0–5.0)
HDL: 21 mg/dL — ABNORMAL LOW (ref >39)
LDL Calculated: 36 mg/dL (ref 0–99)
Triglycerides: 288 mg/dL — ABNORMAL HIGH (ref 0–149)
VLDL CHOLESTEROL CAL: 58 mg/dL — AB (ref 5–40)

## 2018-04-19 LAB — CMP14+EGFR
A/G RATIO: 2 (ref 1.2–2.2)
ALBUMIN: 4.5 g/dL (ref 3.5–5.5)
ALK PHOS: 50 IU/L (ref 39–117)
ALT: 63 IU/L — ABNORMAL HIGH (ref 0–44)
AST: 45 IU/L — ABNORMAL HIGH (ref 0–40)
BILIRUBIN TOTAL: 0.5 mg/dL (ref 0.0–1.2)
BUN / CREAT RATIO: 17 (ref 9–20)
BUN: 15 mg/dL (ref 6–24)
CHLORIDE: 101 mmol/L (ref 96–106)
CO2: 22 mmol/L (ref 20–29)
Calcium: 9.3 mg/dL (ref 8.7–10.2)
Creatinine, Ser: 0.87 mg/dL (ref 0.76–1.27)
GFR calc Af Amer: 115 mL/min/{1.73_m2} (ref >59)
GFR calc non Af Amer: 99 mL/min/{1.73_m2} (ref >59)
GLUCOSE: 201 mg/dL — AB (ref 65–99)
Globulin, Total: 2.2 g/dL (ref 1.5–4.5)
POTASSIUM: 4.1 mmol/L (ref 3.5–5.2)
Sodium: 139 mmol/L (ref 134–144)
Total Protein: 6.7 g/dL (ref 6.0–8.5)

## 2018-04-19 LAB — LACTIC ACID, PLASMA

## 2018-04-20 ENCOUNTER — Ambulatory Visit: Payer: 59 | Admitting: Nurse Practitioner

## 2018-04-20 ENCOUNTER — Encounter: Payer: Self-pay | Admitting: Nurse Practitioner

## 2018-04-20 VITALS — BP 123/76 | HR 65 | Temp 97.1°F | Ht 70.0 in | Wt 228.0 lb

## 2018-04-20 DIAGNOSIS — K219 Gastro-esophageal reflux disease without esophagitis: Secondary | ICD-10-CM | POA: Diagnosis not present

## 2018-04-20 DIAGNOSIS — E1121 Type 2 diabetes mellitus with diabetic nephropathy: Secondary | ICD-10-CM

## 2018-04-20 DIAGNOSIS — I10 Essential (primary) hypertension: Secondary | ICD-10-CM

## 2018-04-20 DIAGNOSIS — E785 Hyperlipidemia, unspecified: Secondary | ICD-10-CM | POA: Diagnosis not present

## 2018-04-20 DIAGNOSIS — R252 Cramp and spasm: Secondary | ICD-10-CM

## 2018-04-20 DIAGNOSIS — R5383 Other fatigue: Secondary | ICD-10-CM | POA: Diagnosis not present

## 2018-04-20 DIAGNOSIS — Z7689 Persons encountering health services in other specified circumstances: Secondary | ICD-10-CM | POA: Diagnosis not present

## 2018-04-20 MED ORDER — PRAVASTATIN SODIUM 40 MG PO TABS: ORAL_TABLET | ORAL | 1 refills | Status: DC

## 2018-04-20 MED ORDER — FENOFIBRATE 160 MG PO TABS: ORAL_TABLET | ORAL | 1 refills | Status: DC

## 2018-04-20 MED ORDER — AMLODIPINE BESYLATE 10 MG PO TABS: 10.0000 mg | ORAL_TABLET | Freq: Every day | ORAL | 3 refills | Status: DC

## 2018-04-20 MED ORDER — OMEPRAZOLE 20 MG PO CPDR: 20.0000 mg | DELAYED_RELEASE_CAPSULE | Freq: Every day | ORAL | 5 refills | Status: DC | PRN

## 2018-04-20 MED ORDER — METOPROLOL TARTRATE 25 MG PO TABS: 25.0000 mg | ORAL_TABLET | Freq: Two times a day (BID) | ORAL | 1 refills | Status: DC

## 2018-04-20 MED ORDER — LISINOPRIL 40 MG PO TABS: 40.0000 mg | ORAL_TABLET | Freq: Every day | ORAL | 1 refills | Status: DC

## 2018-04-20 NOTE — Patient Instructions (Signed)

## 2018-04-20 NOTE — Progress Notes (Signed)
Subjective:    Patient ID: Benjamin Hale, male    DOB: [birth year], 52 y.o.   MRN: [id number]   Chief Complaint: medical management of chronic issues  HPI:  1. Essential hypertension  No c/o chest pain, sob or headache. Dos not heck blood pressure at home. BP Readings from Last 3 Encounters:  04/20/18 123/76  03/05/18 135/88  01/15/18 (!) 145/94     2. Gastroesophageal reflux disease without esophagitis  Is on omeprazole but does not have to take daily. Diet really affects it.  3. Controlled type 2 diabetes mellitus with diabetic nephropathy, without long-term current use of insulin (HCC) blood sugars have been all over the place. He has been trying to wtahc diet. No exercise.  4. Hyperlipidemia with target LDL less than 100  Not watching fats in diet and again no exercise.    Outpatient Encounter Medications as of 04/20/2018  Medication Sig  . amLODipine (NORVASC) 10 MG tablet Take 1 tablet (10 mg total) by mouth daily.  Marland Kitchen aspirin EC 81 MG tablet Take 81 mg by mouth daily.  Marland Kitchen CINNAMON PO Take 1 capsule by mouth daily.  . diazepam (VALIUM) 5 MG tablet TAKE 1 TABLET EVERY 12 HOURS AS NEEDED FOR ANXIETY  . fenofibrate 160 MG tablet TAKE ONE (1) TABLET EACH DAY  . fluticasone (FLONASE) 50 MCG/ACT nasal spray Place 2 sprays into the nose daily as needed for rhinitis or allergies.  Marland Kitchen ibuprofen (ADVIL,MOTRIN) 200 MG tablet Take 400-800 mg by mouth every 6 (six) hours as needed (For headache or pain.).  Marland Kitchen lisinopril (PRINIVIL,ZESTRIL) 40 MG tablet Take 1 tablet (40 mg total) by mouth daily.  . metFORMIN (GLUCOPHAGE) 1000 MG tablet Take 1 tablet (1,000 mg total) by mouth 2 (two) times daily with a meal.  . Multiple Vitamin (MULTIVITAMIN WITH MINERALS) TABS tablet Take 1 tablet by mouth daily.  . naproxen (NAPROSYN) 500 MG tablet Take 1 tablet (500 mg total) by mouth 2 (two) times daily with a meal.  . nitroGLYCERIN (NITROSTAT) 0.4 MG SL tablet Place 1 tablet (0.4 mg total) under the  tongue every 5 (five) minutes as needed for chest pain.  Marland Kitchen omega-3 acid ethyl esters (LOVAZA) 1 g capsule Take 1 capsule (1 g total) by mouth 2 (two) times daily.  Marland Kitchen omeprazole (PRILOSEC) 20 MG capsule Take 1 capsule (20 mg total) by mouth daily as needed (for acid reflux). Acid reflux.  Letta Pate DELICA LANCETS 33G MISC EVERY DAY  . ONETOUCH VERIO test strip TWICE DAILY  . pravastatin (PRAVACHOL) 40 MG tablet TAKE ONE (1) TABLET EACH DAY  . Probiotic Product (PROBIOTIC PO) Take 1 capsule by mouth daily.  . Turmeric 500 MG TABS Take 500 mg by mouth daily.  . VENTOLIN HFA 108 (90 Base) MCG/ACT inhaler USE 2 PUFFS 4 TIMES DAILY AS NEEDED  . metoprolol tartrate (LOPRESSOR) 25 MG tablet Take 1 tablet (25 mg total) by mouth 2 (two) times daily.       New complaints: Muscle aches, cramping and fatigue.- he wanted lactic acid level checked  Social history: Has a mildly autistic son.lives with wife that has lots of medical problems   Review of Systems  Constitutional: Positive for fatigue. Negative for activity change and appetite change.  HENT: Negative.   Eyes: Negative for pain.  Respiratory: Negative for shortness of breath.   Cardiovascular: Negative for chest pain, palpitations and leg swelling.  Gastrointestinal: Negative for abdominal pain.  Endocrine: Negative for polydipsia.  Genitourinary: Negative.  Musculoskeletal: Positive for myalgias.  Skin: Negative for rash.  Neurological: Negative for dizziness, weakness and headaches.  Hematological: Does not bruise/bleed easily.  Psychiatric/Behavioral: Negative.   All other systems reviewed and are negative.      Objective:   Physical Exam  Constitutional: He is oriented to person, place, and time. He appears well-developed and well-nourished.  HENT:  Head: Normocephalic.  Nose: Nose normal.  Mouth/Throat: Oropharynx is clear and moist.  Eyes: Pupils are equal, round, and reactive to light. EOM are normal.  Neck: Normal  range of motion and phonation normal. Neck supple. No JVD present. Carotid bruit is not present. No thyroid mass and no thyromegaly present.  Cardiovascular: Normal rate and regular rhythm.  Pulmonary/Chest: Effort normal and breath sounds normal. No respiratory distress.  Abdominal: Soft. Normal appearance, normal aorta and bowel sounds are normal. There is no tenderness.  Musculoskeletal: Normal range of motion.  Lymphadenopathy:    He has no cervical adenopathy.  Neurological: He is alert and oriented to person, place, and time.  Skin: Skin is warm and dry.  Psychiatric: He has a normal mood and affect. His behavior is normal. Judgment and thought content normal.  Nursing note and vitals reviewed.   BP 123/76   Pulse 65   Temp (!) 97.1 F (36.2 C) (Oral)   Ht 5\' 10"  (1.778 m)   Wt 228 lb (103.4 kg)   BMI 32.71 kg/m   hgba1c 7.3%      Assessment & Plan:  Benjamin Hale comes in today with chief complaint of No chief complaint on file.   Diagnosis and orders addressed:  1. Essential hypertension Low sodium diet - lisinopril (PRINIVIL,ZESTRIL) 40 MG tablet; Take 1 tablet (40 mg total) by mouth daily.  Dispense: 90 tablet; Refill: 1 - metoprolol tartrate (LOPRESSOR) 25 MG tablet; Take 1 tablet (25 mg total) by mouth 2 (two) times daily.  Dispense: 180 tablet; Refill: 1 - amLODipine (NORVASC) 10 MG tablet; Take 1 tablet (10 mg total) by mouth daily.  Dispense: 90 tablet; Refill: 3  2. Gastroesophageal reflux disease without esophagitis Avoid spicy foods Do not eat 2 hours prior to bedtime - omeprazole (PRILOSEC) 20 MG capsule; Take 1 capsule (20 mg total) by mouth daily as needed (for acid reflux). Acid reflux.  Dispense: 30 capsule; Refill: 5 - pravastatin (PRAVACHOL) 40 MG tablet; TAKE ONE (1) TABLET EACH DAY  Dispense: 90 tablet; Refill: 1  3. Controlled type 2 diabetes mellitus with diabetic nephropathy, without long-term current use of insulin (HCC) Stricter carb  counting  4. Hyperlipidemia with target LDL less than 100 Low fat diet - fenofibrate 160 MG tablet; TAKE ONE (1) TABLET EACH DAY  Dispense: 90 tablet; Refill: 1  5. Muscle cramping Labs pending - CK  6. Other fatigue Labs pending - CBC with Differential/Platelet - Thyroid Panel With TSH   Labs pending Health Maintenance reviewed Diet and exercise encouraged  Follow up plan: 3 months   Mary-Margaret Daphine Deutscher, [name]

## 2018-04-21 LAB — CBC WITH DIFFERENTIAL/PLATELET
Basophils Absolute: 0.1 10*3/uL (ref 0.0–0.2)
Basos: 1 %
EOS (ABSOLUTE): 0.2 10*3/uL (ref 0.0–0.4)
EOS: 2 %
Hematocrit: 43.4 % (ref 37.5–51.0)
Hemoglobin: 14.8 g/dL (ref 13.0–17.7)
IMMATURE GRANULOCYTES: 0 %
Immature Grans (Abs): 0 10*3/uL (ref 0.0–0.1)
LYMPHS: 33 %
Lymphocytes Absolute: 3 10*3/uL (ref 0.7–3.1)
MCH: 29.4 pg (ref 26.6–33.0)
MCHC: 34.1 g/dL (ref 31.5–35.7)
MCV: 86 fL (ref 79–97)
MONOS ABS: 0.9 10*3/uL (ref 0.1–0.9)
Monocytes: 10 %
NEUTROS PCT: 54 %
Neutrophils Absolute: 4.9 10*3/uL (ref 1.4–7.0)
PLATELETS: 276 10*3/uL (ref 150–450)
RBC: 5.03 x10E6/uL (ref 4.14–5.80)
RDW: 13 % (ref 12.3–15.4)
WBC: 9.2 10*3/uL (ref 3.4–10.8)

## 2018-04-21 LAB — THYROID PANEL WITH TSH
Free Thyroxine Index: 2.7 (ref 1.2–4.9)
T3 UPTAKE RATIO: 29 % (ref 24–39)
T4 TOTAL: 9.2 ug/dL (ref 4.5–12.0)
TSH: 1.18 u[IU]/mL (ref 0.450–4.500)

## 2018-04-21 LAB — CK: Total CK: 304 U/L — ABNORMAL HIGH (ref 24–204)

## 2018-04-21 NOTE — Addendum Note (Signed)
Addended by: [name] on: 04/21/2018 07:44 AM   Modules accepted: Orders

## 2018-04-23 LAB — SPECIMEN STATUS REPORT

## 2018-04-23 LAB — VITAMIN B12: Vitamin B-12: 516 pg/mL (ref 232–1245)

## 2018-04-24 DIAGNOSIS — Z23 Encounter for immunization: Secondary | ICD-10-CM | POA: Diagnosis not present

## 2018-04-26 ENCOUNTER — Other Ambulatory Visit: Payer: Self-pay | Admitting: *Deleted

## 2018-04-26 ENCOUNTER — Other Ambulatory Visit: Payer: 59

## 2018-04-26 DIAGNOSIS — R252 Cramp and spasm: Secondary | ICD-10-CM | POA: Diagnosis not present

## 2018-04-26 DIAGNOSIS — E1121 Type 2 diabetes mellitus with diabetic nephropathy: Secondary | ICD-10-CM | POA: Diagnosis not present

## 2018-04-27 LAB — LACTIC ACID, PLASMA: LACTATE: 13.2 mg/dL (ref 4.8–25.7)

## 2018-04-27 LAB — CK: CK TOTAL: 194 U/L (ref 24–204)

## 2018-06-20 ENCOUNTER — Other Ambulatory Visit: Payer: Self-pay | Admitting: Family

## 2018-06-20 DIAGNOSIS — R202 Paresthesia of skin: Secondary | ICD-10-CM

## 2018-06-20 DIAGNOSIS — R531 Weakness: Secondary | ICD-10-CM

## 2018-06-20 DIAGNOSIS — R200 Anesthesia of skin: Secondary | ICD-10-CM

## 2018-06-20 DIAGNOSIS — M5090 Cervical disc disorder, unspecified, unspecified cervical region: Secondary | ICD-10-CM

## 2018-07-19 ENCOUNTER — Ambulatory Visit (INDEPENDENT_AMBULATORY_CARE_PROVIDER_SITE_OTHER): Payer: 59

## 2018-07-19 ENCOUNTER — Encounter: Payer: Self-pay | Admitting: Physician Assistant

## 2018-07-19 ENCOUNTER — Ambulatory Visit: Payer: 59 | Admitting: Physician Assistant

## 2018-07-19 VITALS — BP 151/78 | HR 71 | Temp 97.4°F | Ht 70.0 in | Wt 226.6 lb

## 2018-07-19 DIAGNOSIS — M19072 Primary osteoarthritis, left ankle and foot: Secondary | ICD-10-CM | POA: Diagnosis not present

## 2018-07-19 DIAGNOSIS — M25572 Pain in left ankle and joints of left foot: Secondary | ICD-10-CM

## 2018-07-19 DIAGNOSIS — Z8781 Personal history of (healed) traumatic fracture: Secondary | ICD-10-CM

## 2018-07-20 NOTE — Progress Notes (Signed)
BP (!) 151/78   Pulse 71   Temp (!) 97.4 F (36.3 C) (Oral)   Ht 5\' 10"  (1.778 m)   Wt 226 lb 9.6 oz (102.8 kg)   BMI 32.51 kg/m    Subjective:    Patient ID: Benjamin Hale, male    DOB: 13-Nov-1965, 53 y.o.   MRN: [id number]  HPI: Benjamin Hale is a 53 y.o. male presenting on 07/19/2018 for Ankle Pain (left )  This patient comes in because he had a recurrence of left ankle pain.  He has had an old injury of it a few years ago.  There was actually a fracture fibula.  She had some very sharp pain that has been over the past couple weeks but then it got better.  However it still comes and goes so is concerned that there is some type of arthritis changes or something going on there.  X-ray was clear today of any kind of fracture but there was some mild degeneration showing.  He is going to continue wearing a supportive brace for the next couple of weeks we will have him in tennis shoes as possible so that he can wear the brace.  Past Medical History:  Diagnosis Date  . Anginal pain (HCC)   . Arthritis    "neck" (12/23/2012)  . Asthma   . Atrial flutter (HCC)    a. s/p cervical spine surgery 4/14 => s/p DCCV;  b.  Echo 09/2012:  Mild LVH, EF 60-65%, Gr 2 DD.c. s/p atrial flutter ablation 12/23/2012 by Dr Johney FrameAllred  . Cervical disc disease    s/p cervical spine surgery 4/14  . COPD (chronic obstructive pulmonary disease) (HCC)   . Diabetes mellitus without complication (HCC)   . Exertional shortness of breath   . GERD (gastroesophageal reflux disease)   . Headache    "weekly" (12/23/2012)  . HTN (hypertension)   . Hyperlipidemia   . Hypogonadism male   . Migraines    "once q 3-4 months" (12/23/2012)   Relevant past medical, surgical, family and social history reviewed and updated as indicated. Interim medical history since our last visit reviewed. Allergies and medications reviewed and updated. DATA REVIEWED: CHART IN EPIC  Family History reviewed for pertinent  findings.  Review of Systems  Constitutional: Negative.  Negative for appetite change and fatigue.  Eyes: Negative for pain and visual disturbance.  Respiratory: Negative.  Negative for cough, chest tightness, shortness of breath and wheezing.   Cardiovascular: Negative.  Negative for chest pain, palpitations and leg swelling.  Gastrointestinal: Negative.  Negative for abdominal pain, diarrhea, nausea and vomiting.  Genitourinary: Negative.   Skin: Negative.  Negative for color change and rash.  Neurological: Negative.  Negative for weakness, numbness and headaches.  Psychiatric/Behavioral: Negative.     Allergies as of 07/19/2018   No Known Allergies     Medication List       Accurate as of July 19, 2018 11:59 PM. Always use your most recent med list.        amLODipine 10 MG tablet Commonly known as:  NORVASC Take 1 tablet (10 mg total) by mouth daily.   aspirin EC 81 MG tablet Take 81 mg by mouth daily.   CINNAMON PO Take 1 capsule by mouth daily.   diazepam 5 MG tablet Commonly known as:  VALIUM TAKE 1 TABLET EVERY 12 HOURS AS NEEDED FOR ANXIETY   fenofibrate 160 MG tablet TAKE ONE (1) TABLET EACH DAY   fluticasone  50 MCG/ACT nasal spray Commonly known as:  FLONASE Place 2 sprays into the nose daily as needed for rhinitis or allergies.   ibuprofen 200 MG tablet Commonly known as:  ADVIL,MOTRIN Take 400-800 mg by mouth every 6 (six) hours as needed (For headache or pain.).   lisinopril 40 MG tablet Commonly known as:  PRINIVIL,ZESTRIL Take 1 tablet (40 mg total) by mouth daily.   metFORMIN 1000 MG tablet Commonly known as:  GLUCOPHAGE Take 1 tablet (1,000 mg total) by mouth 2 (two) times daily with a meal.   metoprolol tartrate 25 MG tablet Commonly known as:  LOPRESSOR Take 1 tablet (25 mg total) by mouth 2 (two) times daily.   multivitamin with minerals Tabs tablet Take 1 tablet by mouth daily.   naproxen 500 MG tablet Commonly known as:   NAPROSYN Take 1 tablet (500 mg total) by mouth 2 (two) times daily with a meal.   nitroGLYCERIN 0.4 MG SL tablet Commonly known as:  NITROSTAT Place 1 tablet (0.4 mg total) under the tongue every 5 (five) minutes as needed for chest pain.   omega-3 acid ethyl esters 1 g capsule Commonly known as:  LOVAZA Take 1 capsule (1 g total) by mouth 2 (two) times daily.   omeprazole 20 MG capsule Commonly known as:  PRILOSEC Take 1 capsule (20 mg total) by mouth daily as needed (for acid reflux). Acid reflux.   ONETOUCH DELICA LANCETS 33G Misc EVERY DAY   ONETOUCH VERIO test strip Generic drug:  glucose blood TWICE DAILY   pravastatin 40 MG tablet Commonly known as:  PRAVACHOL TAKE ONE (1) TABLET EACH DAY   PROBIOTIC PO Take 1 capsule by mouth daily.   Turmeric 500 MG Tabs Take 500 mg by mouth daily.   VENTOLIN HFA 108 (90 Base) MCG/ACT inhaler Generic drug:  albuterol USE 2 PUFFS 4 TIMES DAILY AS NEEDED          Objective:    BP (!) 151/78   Pulse 71   Temp (!) 97.4 F (36.3 C) (Oral)   Ht 5\' 10"  (1.778 m)   Wt 226 lb 9.6 oz (102.8 kg)   BMI 32.51 kg/m   No Known Allergies  Wt Readings from Last 3 Encounters:  07/19/18 226 lb 9.6 oz (102.8 kg)  04/20/18 228 lb (103.4 kg)  03/05/18 228 lb (103.4 kg)    Physical Exam Vitals signs and nursing note reviewed.  Constitutional:      General: He is not in acute distress.    Appearance: He is well-developed.  HENT:     Head: Normocephalic and atraumatic.  Eyes:     Conjunctiva/sclera: Conjunctivae normal.     Pupils: Pupils are equal, round, and reactive to light.  Cardiovascular:     Rate and Rhythm: Normal rate and regular rhythm.     Heart sounds: Normal heart sounds.  Pulmonary:     Effort: Pulmonary effort is normal. No respiratory distress.     Breath sounds: Normal breath sounds.  Musculoskeletal:     Left foot: Swelling present. No tenderness or deformity.       Feet:  Skin:    General: Skin is warm  and dry.  Psychiatric:        Behavior: Behavior normal.     Results for orders placed or performed in visit on 04/26/18  CK  Result Value Ref Range   Total CK 194 24 - 204 U/L      Assessment & Plan:  1. Acute left ankle pain Support antiiflammatory - DG Ankle Complete Left; Future  2. History of ankle fracture - DG Ankle Complete Left; Future   Continue all other maintenance medications as listed above.  Follow up plan: Return if symptoms worsen or fail to improve.  Educational handout given for survey  Remus LofflerAngel S. Mike Hamre PA-C [facility] [address]  [place], [place] [address] [phone]   07/20/2018, 9:53 PM

## 2018-07-21 ENCOUNTER — Ambulatory Visit: Payer: 59 | Admitting: Physician Assistant

## 2018-07-21 ENCOUNTER — Encounter: Payer: Self-pay | Admitting: Physician Assistant

## 2018-07-21 VITALS — BP 134/77 | HR 83 | Temp 101.9°F | Ht 70.0 in | Wt 223.1 lb

## 2018-07-21 DIAGNOSIS — R509 Fever, unspecified: Secondary | ICD-10-CM

## 2018-07-21 DIAGNOSIS — J101 Influenza due to other identified influenza virus with other respiratory manifestations: Secondary | ICD-10-CM | POA: Diagnosis not present

## 2018-07-21 LAB — VERITOR FLU A/B WAIVED
Influenza A: NEGATIVE
Influenza B: POSITIVE — AB

## 2018-07-21 MED ORDER — OSELTAMIVIR PHOSPHATE 75 MG PO CAPS: 75.0000 mg | ORAL_CAPSULE | Freq: Two times a day (BID) | ORAL | 0 refills | Status: DC

## 2018-07-21 NOTE — Progress Notes (Signed)
BP 134/77   Pulse 83   Temp (!) 101.9 F (38.8 C) (Oral)   Ht 5\' 10"  (1.778 m)   Wt 223 lb 2 oz (101.2 kg)   BMI 32.02 kg/m    Subjective:    Patient ID: Benjamin Nimroderrance D Mortellaro, male    DOB: 07/04/1965, 53 y.o.   MRN: [id number]  HPI: Benjamin Hale is a 53 y.o. male presenting on 07/21/2018 for Fever  This patient has had less than 2 days severe fever, chills, myalgias.  Complains of sinus headache and postnasal drainage. There is copious drainage at times. Associated sore throat, decreased appetite and headache.  Has been exposed to influenza.    Past Medical History:  Diagnosis Date  . Anginal pain (HCC)   . Arthritis    "neck" (12/23/2012)  . Asthma   . Atrial flutter (HCC)    a. s/p cervical spine surgery 4/14 => s/p DCCV;  b.  Echo 09/2012:  Mild LVH, EF 60-65%, Gr 2 DD.c. s/p atrial flutter ablation 12/23/2012 by Dr Johney FrameAllred  . Cervical disc disease    s/p cervical spine surgery 4/14  . COPD (chronic obstructive pulmonary disease) (HCC)   . Diabetes mellitus without complication (HCC)   . Exertional shortness of breath   . GERD (gastroesophageal reflux disease)   . Headache    "weekly" (12/23/2012)  . HTN (hypertension)   . Hyperlipidemia   . Hypogonadism male   . Migraines    "once q 3-4 months" (12/23/2012)   Relevant past medical, surgical, family and social history reviewed and updated as indicated. Interim medical history since our last visit reviewed. Allergies and medications reviewed and updated. DATA REVIEWED: CHART IN EPIC  Family History reviewed for pertinent findings.  Review of Systems  Constitutional: Positive for activity change, fatigue and fever. Negative for appetite change.  HENT: Positive for congestion and sore throat. Negative for sinus pressure.   Eyes: Negative.  Negative for pain and visual disturbance.  Respiratory: Positive for cough. Negative for chest tightness, shortness of breath and wheezing.   Cardiovascular: Negative.  Negative for  chest pain, palpitations and leg swelling.  Gastrointestinal: Positive for nausea. Negative for abdominal pain, diarrhea and vomiting.  Endocrine: Negative.   Genitourinary: Negative.   Musculoskeletal: Positive for back pain and myalgias. Negative for arthralgias.  Skin: Negative.  Negative for color change and rash.  Neurological: Positive for headaches. Negative for weakness and numbness.  Psychiatric/Behavioral: Negative.     Allergies as of 07/21/2018   No Known Allergies     Medication List       Accurate as of July 21, 2018  6:29 PM. Always use your most recent med list.        amLODipine 10 MG tablet Commonly known as:  NORVASC Take 1 tablet (10 mg total) by mouth daily.   aspirin EC 81 MG tablet Take 81 mg by mouth daily.   CINNAMON PO Take 1 capsule by mouth daily.   diazepam 5 MG tablet Commonly known as:  VALIUM TAKE 1 TABLET EVERY 12 HOURS AS NEEDED FOR ANXIETY   fenofibrate 160 MG tablet TAKE ONE (1) TABLET EACH DAY   fluticasone 50 MCG/ACT nasal spray Commonly known as:  FLONASE Place 2 sprays into the nose daily as needed for rhinitis or allergies.   ibuprofen 200 MG tablet Commonly known as:  ADVIL,MOTRIN Take 400-800 mg by mouth every 6 (six) hours as needed (For headache or pain.).   lisinopril 40 MG tablet  Commonly known as:  PRINIVIL,ZESTRIL Take 1 tablet (40 mg total) by mouth daily.   metFORMIN 1000 MG tablet Commonly known as:  GLUCOPHAGE Take 1 tablet (1,000 mg total) by mouth 2 (two) times daily with a meal.   metoprolol tartrate 25 MG tablet Commonly known as:  LOPRESSOR Take 1 tablet (25 mg total) by mouth 2 (two) times daily.   multivitamin with minerals Tabs tablet Take 1 tablet by mouth daily.   naproxen 500 MG tablet Commonly known as:  NAPROSYN Take 1 tablet (500 mg total) by mouth 2 (two) times daily with a meal.   nitroGLYCERIN 0.4 MG SL tablet Commonly known as:  NITROSTAT Place 1 tablet (0.4 mg total) under the  tongue every 5 (five) minutes as needed for chest pain.   omega-3 acid ethyl esters 1 g capsule Commonly known as:  LOVAZA Take 1 capsule (1 g total) by mouth 2 (two) times daily.   omeprazole 20 MG capsule Commonly known as:  PRILOSEC Take 1 capsule (20 mg total) by mouth daily as needed (for acid reflux). Acid reflux.   ONETOUCH DELICA LANCETS 33G Misc EVERY DAY   ONETOUCH VERIO test strip Generic drug:  glucose blood TWICE DAILY   oseltamivir 75 MG capsule Commonly known as:  TAMIFLU Take 1 capsule (75 mg total) by mouth 2 (two) times daily.   pravastatin 40 MG tablet Commonly known as:  PRAVACHOL TAKE ONE (1) TABLET EACH DAY   PROBIOTIC PO Take 1 capsule by mouth daily.   Turmeric 500 MG Tabs Take 500 mg by mouth daily.   VENTOLIN HFA 108 (90 Base) MCG/ACT inhaler Generic drug:  albuterol USE 2 PUFFS 4 TIMES DAILY AS NEEDED          Objective:    BP 134/77   Pulse 83   Temp (!) 101.9 F (38.8 C) (Oral)   Ht 5\' 10"  (1.778 m)   Wt 223 lb 2 oz (101.2 kg)   BMI 32.02 kg/m   No Known Allergies  Wt Readings from Last 3 Encounters:  07/21/18 223 lb 2 oz (101.2 kg)  07/19/18 226 lb 9.6 oz (102.8 kg)  04/20/18 228 lb (103.4 kg)    Physical Exam Vitals signs and nursing note reviewed.  Constitutional:      General: He is in acute distress.     Appearance: He is well-developed.  HENT:     Head: Normocephalic and atraumatic.     Right Ear: Tympanic membrane normal. No drainage. No middle ear effusion.     Left Ear: Tympanic membrane normal. No drainage.  No middle ear effusion.     Nose: Mucosal edema and rhinorrhea present.     Right Sinus: No maxillary sinus tenderness.     Left Sinus: No maxillary sinus tenderness.     Mouth/Throat:     Pharynx: Uvula midline. Posterior oropharyngeal erythema present. No oropharyngeal exudate.  Eyes:     General:        Right eye: No discharge.        Left eye: No discharge.     Conjunctiva/sclera: Conjunctivae  normal.     Pupils: Pupils are equal, round, and reactive to light.  Neck:     Musculoskeletal: Normal range of motion.  Cardiovascular:     Rate and Rhythm: Normal rate and regular rhythm.     Heart sounds: Normal heart sounds.  Pulmonary:     Effort: Pulmonary effort is normal. No respiratory distress.     Breath  sounds: Normal breath sounds. No wheezing.  Abdominal:     Palpations: Abdomen is soft.  Lymphadenopathy:     Cervical: No cervical adenopathy.  Skin:    General: Skin is warm and dry.  Neurological:     Mental Status: He is alert and oriented to person, place, and time.  Psychiatric:        Behavior: Behavior normal.     Results for orders placed or performed in visit on 07/21/18  Veritor Flu A/B Waived  Result Value Ref Range   Influenza A Negative Negative   Influenza B Positive (A) Negative      Assessment & Plan:   1. Fever, unspecified fever cause - Veritor Flu A/B Waived  2. Influenza B - oseltamivir (TAMIFLU) 75 MG capsule; Take 1 capsule (75 mg total) by mouth 2 (two) times daily.  Dispense: 10 capsule; Refill: 0   Continue all other maintenance medications as listed above.  Follow up plan: No follow-ups on file.  Educational handout given for influenza  Remus Loffler PA-C [facility] [address]  [place], [place] [address] [phone]   07/21/2018, 6:29 PM

## 2018-08-17 ENCOUNTER — Encounter: Payer: Self-pay | Admitting: Nurse Practitioner

## 2018-08-17 ENCOUNTER — Ambulatory Visit: Payer: 59 | Admitting: Nurse Practitioner

## 2018-08-17 VITALS — BP 126/84 | HR 64 | Temp 97.5°F | Ht 70.0 in | Wt 220.0 lb

## 2018-08-17 DIAGNOSIS — E1121 Type 2 diabetes mellitus with diabetic nephropathy: Secondary | ICD-10-CM | POA: Diagnosis not present

## 2018-08-17 DIAGNOSIS — R079 Chest pain, unspecified: Secondary | ICD-10-CM | POA: Diagnosis not present

## 2018-08-17 DIAGNOSIS — E785 Hyperlipidemia, unspecified: Secondary | ICD-10-CM | POA: Diagnosis not present

## 2018-08-17 DIAGNOSIS — K219 Gastro-esophageal reflux disease without esophagitis: Secondary | ICD-10-CM

## 2018-08-17 DIAGNOSIS — I10 Essential (primary) hypertension: Secondary | ICD-10-CM

## 2018-08-17 DIAGNOSIS — F321 Major depressive disorder, single episode, moderate: Secondary | ICD-10-CM

## 2018-08-17 LAB — BAYER DCA HB A1C WAIVED: HB A1C (BAYER DCA - WAIVED): 7.5 % — ABNORMAL HIGH (ref <7.0)

## 2018-08-17 MED ORDER — METFORMIN HCL 1000 MG PO TABS: 1000.0000 mg | ORAL_TABLET | Freq: Two times a day (BID) | ORAL | 1 refills | Status: DC

## 2018-08-17 MED ORDER — AMLODIPINE BESYLATE 10 MG PO TABS: 10.0000 mg | ORAL_TABLET | Freq: Every day | ORAL | 1 refills | Status: DC

## 2018-08-17 MED ORDER — BLOOD GLUCOSE METER KIT: PACK | 0 refills | Status: DC

## 2018-08-17 MED ORDER — OMEPRAZOLE 20 MG PO CPDR: 20.0000 mg | DELAYED_RELEASE_CAPSULE | Freq: Every day | ORAL | 1 refills | Status: DC | PRN

## 2018-08-17 MED ORDER — METOPROLOL TARTRATE 25 MG PO TABS: 25.0000 mg | ORAL_TABLET | Freq: Two times a day (BID) | ORAL | 1 refills | Status: DC

## 2018-08-17 MED ORDER — LISINOPRIL 40 MG PO TABS: 40.0000 mg | ORAL_TABLET | Freq: Every day | ORAL | 1 refills | Status: DC

## 2018-08-17 MED ORDER — ESCITALOPRAM OXALATE 10 MG PO TABS: 10.0000 mg | ORAL_TABLET | Freq: Every day | ORAL | 5 refills | Status: DC

## 2018-08-17 NOTE — Progress Notes (Signed)
Subjective:    Patient ID: Benjamin Hale, male    DOB: 05-21-1966, 53 y.o.   MRN: [id number]   Chief Complaint: Medical Management of Chronic Issues (Depression and chest pain)   HPI:  1. Essential hypertension  No c/o chest pain, sob or headache. Does  check blood pressure at home, and it has been running high. BP Readings from Last 3 Encounters:  08/17/18 126/84  07/21/18 134/77  07/19/18 (!) 151/78     2. Controlled type 2 diabetes mellitus with diabetic nephropathy, without long-term current use of insulin ([place]) last hgba1c was 7.3%. blood sugars have been high when he checks them. He says he needs a new meter.  3. Hyperlipidemia with target LDL less than 100  Does not watch diet and does no exercise.  4. Chest pain, unspecified type  Has been having chest pain for the last week and 1/2. He says pain radiates to left arm and sometimes up to jaw. Pain usually lasts about a couple of minutes. 2 nights ago it was really bad. He says he also feels some pain there. He gets clammy during episodes. He is under a lot of pressure at work and he is having to do several jobs because people have quit.  5. Gastroesophageal reflux disease without esophagitis  Takes omeprazole daily and that helps.    Outpatient Encounter Medications as of 08/17/2018  Medication Sig  . amLODipine (NORVASC) 10 MG tablet Take 1 tablet (10 mg total) by mouth daily.  Marland Kitchen aspirin EC 81 MG tablet Take 81 mg by mouth daily.  . diazepam (VALIUM) 5 MG tablet TAKE 1 TABLET EVERY 12 HOURS AS NEEDED FOR ANXIETY  . fluticasone (FLONASE) 50 MCG/ACT nasal spray Place 2 sprays into the nose daily as needed for rhinitis or allergies.  Marland Kitchen ibuprofen (ADVIL,MOTRIN) 200 MG tablet Take 400-800 mg by mouth every 6 (six) hours as needed (For headache or pain.).  Marland Kitchen lisinopril (PRINIVIL,ZESTRIL) 40 MG tablet Take 1 tablet (40 mg total) by mouth daily.  . metFORMIN (GLUCOPHAGE) 1000 MG tablet Take 1 tablet (1,000 mg total) by mouth  2 (two) times daily with a meal.  . Multiple Vitamin (MULTIVITAMIN WITH MINERALS) TABS tablet Take 1 tablet by mouth daily.  . naproxen (NAPROSYN) 500 MG tablet Take 1 tablet (500 mg total) by mouth 2 (two) times daily with a meal.  . nitroGLYCERIN (NITROSTAT) 0.4 MG SL tablet Place 1 tablet (0.4 mg total) under the tongue every 5 (five) minutes as needed for chest pain.  Marland Kitchen omega-3 acid ethyl esters (LOVAZA) 1 g capsule Take 1 capsule (1 g total) by mouth 2 (two) times daily.  Marland Kitchen omeprazole (PRILOSEC) 20 MG capsule Take 1 capsule (20 mg total) by mouth daily as needed (for acid reflux). Acid reflux.  Glory Rosebush DELICA LANCETS 81O MISC EVERY DAY  . ONETOUCH VERIO test strip TWICE DAILY  . Probiotic Product (PROBIOTIC PO) Take 1 capsule by mouth daily.  . VENTOLIN HFA 108 (90 Base) MCG/ACT inhaler USE 2 PUFFS 4 TIMES DAILY AS NEEDED  . metoprolol tartrate (LOPRESSOR) 25 MG tablet Take 1 tablet (25 mg total) by mouth 2 (two) times daily.       New complaints: Having a lot od anxiety and depression. His job makes him on edge all the time. Depression screen [facility] 2/9 08/17/2018 07/19/2018 04/20/2018  Decreased Interest 2 0 0  Down, Depressed, Hopeless 2 0 0  PHQ - 2 Score 4 0 0  Altered sleeping 3 - -  Tired, decreased energy 3 - -  Change in appetite 1 - -  Feeling bad or failure about yourself  0 - -  Trouble concentrating 0 - -  Moving slowly or fidgety/restless 0 - -  Suicidal thoughts 0 - -  PHQ-9 Score 11 - -  Difficult doing work/chores - - -     Social history: Lives with his wife who has had lots of medical problems. Has a highly functioning autistic son.   Review of Systems  Constitutional: Negative for activity change and appetite change.  HENT: Negative.   Eyes: Negative for pain.  Respiratory: Negative for shortness of breath.   Cardiovascular: Negative for chest pain, palpitations and leg swelling.  Gastrointestinal: Negative for abdominal pain.  Endocrine: Negative for  polydipsia.  Genitourinary: Negative.   Skin: Negative for rash.  Neurological: Negative for dizziness, weakness and headaches.  Hematological: Does not bruise/bleed easily.  Psychiatric/Behavioral: Positive for dysphoric mood. The patient is nervous/anxious.   All other systems reviewed and are negative.      Objective:   Physical Exam Vitals signs and nursing note reviewed.  Constitutional:      Appearance: Normal appearance. He is well-developed.  HENT:     Head: Normocephalic.     Nose: Nose normal.  Eyes:     Pupils: Pupils are equal, round, and reactive to light.  Neck:     Musculoskeletal: Normal range of motion and neck supple.     Thyroid: No thyroid mass or thyromegaly.     Vascular: No carotid bruit or JVD.     Trachea: Phonation normal.  Cardiovascular:     Rate and Rhythm: Normal rate and regular rhythm.  Pulmonary:     Effort: Pulmonary effort is normal. No respiratory distress.     Breath sounds: Normal breath sounds.  Abdominal:     General: Bowel sounds are normal.     Palpations: Abdomen is soft.     Tenderness: There is no abdominal tenderness.  Musculoskeletal: Normal range of motion.  Lymphadenopathy:     Cervical: No cervical adenopathy.  Skin:    General: Skin is warm and dry.  Neurological:     Mental Status: He is alert and oriented to person, place, and time.  Psychiatric:        Attention and Perception: Attention and perception normal.        Mood and Affect: Mood is anxious and depressed.        Behavior: Behavior normal.        Thought Content: Thought content normal.        Judgment: Judgment normal.    BP 126/84   Pulse 64   Temp (!) 97.5 F (36.4 C) (Oral)   Ht '5\' 10"'$  (1.778 m)   Wt 220 lb (99.8 kg)   BMI 31.57 kg/m   EKG- NSR     Assessment & Plan:  Benjamin Hale comes in today with chief complaint of Medical Management of Chronic Issues (Depression and chest pain)   Diagnosis and orders addressed:  1. Essential  hypertension Low sodium diet - CMP14+EGFR - lisinopril (PRINIVIL,ZESTRIL) 40 MG tablet; Take 1 tablet (40 mg total) by mouth daily.  Dispense: 90 tablet; Refill: 1 - metoprolol tartrate (LOPRESSOR) 25 MG tablet; Take 1 tablet (25 mg total) by mouth 2 (two) times daily.  Dispense: 180 tablet; Refill: 1 - amLODipine (NORVASC) 10 MG tablet; Take 1 tablet (10 mg total) by mouth daily.  Dispense: 90 tablet; Refill: 1  2. Controlled type 2 diabetes mellitus with diabetic nephropathy, without long-term current use of insulin (HCC) Stricter carb watching - Bayer DCA Hb A1c Waived - Microalbumin / creatinine urine ratio - metFORMIN (GLUCOPHAGE) 1000 MG tablet; Take 1 tablet (1,000 mg total) by mouth 2 (two) times daily with a meal.  Dispense: 180 tablet; Refill: 1  3. Hyperlipidemia with target LDL less than 100 Low fat diet - Lipid panel  4. Chest pain, unspecified type Referral made to cardiology - EKG 12-Lead - Ambulatory referral to Cardiology  5. Gastroesophageal reflux disease without esophagitis Avoid spicy foods Do not eat 2 hours prior to bedtime - omeprazole (PRILOSEC) 20 MG capsule; Take 1 capsule (20 mg total) by mouth daily as needed (for acid reflux). Acid reflux.  Dispense: 9 capsule; Refill: 1  6. Depression, major, single episode, moderate (HCC) Stress management - escitalopram (LEXAPRO) 10 MG tablet; Take 1 tablet (10 mg total) by mouth daily.  Dispense: 30 tablet; Refill: 5   Labs pending Health Maintenance reviewed Diet and exercise encouraged  Follow up plan: 3 weeks   Mary-Margaret Hassell Done, [name]

## 2018-08-17 NOTE — Patient Instructions (Signed)

## 2018-08-18 LAB — CMP14+EGFR
ALT: 92 IU/L — ABNORMAL HIGH (ref 0–44)
AST: 63 IU/L — ABNORMAL HIGH (ref 0–40)
Albumin/Globulin Ratio: 1.8 (ref 1.2–2.2)
Albumin: 4.4 g/dL (ref 3.8–4.9)
Alkaline Phosphatase: 71 IU/L (ref 39–117)
BILIRUBIN TOTAL: 0.6 mg/dL (ref 0.0–1.2)
BUN/Creatinine Ratio: 17 (ref 9–20)
BUN: 14 mg/dL (ref 6–24)
CO2: 24 mmol/L (ref 20–29)
Calcium: 9.3 mg/dL (ref 8.7–10.2)
Chloride: 99 mmol/L (ref 96–106)
Creatinine, Ser: 0.82 mg/dL (ref 0.76–1.27)
GFR calc Af Amer: 118 mL/min/{1.73_m2} (ref >59)
GFR calc non Af Amer: 102 mL/min/{1.73_m2} (ref >59)
GLUCOSE: 212 mg/dL — AB (ref 65–99)
Globulin, Total: 2.5 g/dL (ref 1.5–4.5)
Potassium: 4.7 mmol/L (ref 3.5–5.2)
Sodium: 141 mmol/L (ref 134–144)
Total Protein: 6.9 g/dL (ref 6.0–8.5)

## 2018-08-18 LAB — LIPID PANEL
Chol/HDL Ratio: 5.4 ratio — ABNORMAL HIGH (ref 0.0–5.0)
Cholesterol, Total: 113 mg/dL (ref 100–199)
HDL: 21 mg/dL — ABNORMAL LOW (ref >39)
LDL Calculated: 26 mg/dL (ref 0–99)
Triglycerides: 331 mg/dL — ABNORMAL HIGH (ref 0–149)
VLDL CHOLESTEROL CAL: 66 mg/dL — AB (ref 5–40)

## 2018-09-13 ENCOUNTER — Ambulatory Visit: Payer: 59 | Admitting: Cardiology

## 2018-09-15 ENCOUNTER — Other Ambulatory Visit: Payer: Self-pay

## 2018-09-15 ENCOUNTER — Encounter: Payer: Self-pay | Admitting: Physician Assistant

## 2018-09-15 ENCOUNTER — Ambulatory Visit (INDEPENDENT_AMBULATORY_CARE_PROVIDER_SITE_OTHER): Payer: 59 | Admitting: Physician Assistant

## 2018-09-15 DIAGNOSIS — J020 Streptococcal pharyngitis: Secondary | ICD-10-CM | POA: Diagnosis not present

## 2018-09-15 MED ORDER — AMOXICILLIN 500 MG PO CAPS: 500.0000 mg | ORAL_CAPSULE | Freq: Three times a day (TID) | ORAL | 0 refills | Status: DC

## 2018-09-15 NOTE — Progress Notes (Signed)
Telephone visit  Subjective: CC: Sore throat PCP: Chevis Pretty, [name] ZOX:[id number] D Vanduyne is a 53 y.o. male calls for telephone consult today. Patient provides verbal consent for consult held via phone.  Patient is identified with 2 separate identifiers.  At this time the entire area is on COVID-19 social distancing and stay home orders are in place.  Patient is of higher risk and therefore we are performing this by a virtual method.  Location of patient: Home Location of provider: [facility] Others present for call: No  This patient has had for 5 days of sore throat that has become much more painful.  He states it has a scorched feeling.  He states that there is redness and streaking up the back of the throat now.  He has had a history of strep and this does feel the same as when he has had it in the past.  He has had some phlegm in his throat.  He has had a very slight elevation in his temperature but it has stayed under 100.  There has been a possible exposure to strep at his son's daycare.     ROS: Per HPI  No Known Allergies Past Medical History:  Diagnosis Date  . Anginal pain ([place])   . Arthritis    "neck" (12/23/2012)  . Asthma   . Atrial flutter ([place])    a. s/p cervical spine surgery 4/14 => s/p DCCV;  b.  Echo 09/2012:  Mild LVH, EF 60-65%, Gr 2 DD.c. s/p atrial flutter ablation 12/23/2012 by Dr Rayann Heman  . Cervical disc disease    s/p cervical spine surgery 4/14  . COPD (chronic obstructive pulmonary disease) ([place])   . Diabetes mellitus without complication ([place])   . Exertional shortness of breath   . GERD (gastroesophageal reflux disease)   . Headache    "weekly" (12/23/2012)  . HTN (hypertension)   . Hyperlipidemia   . Hypogonadism male   . Migraines    "once q 3-4 months" (12/23/2012)    Current Outpatient Medications:  .  amLODipine (NORVASC) 10 MG tablet, Take 1 tablet (10 mg total) by mouth daily., Disp: 90 tablet, Rfl: 1 .  aspirin EC 81 MG tablet, Take  81 mg by mouth daily., Disp: , Rfl:  .  blood glucose meter kit and supplies, Dispense based on patient and insurance preference. Use up to four times daily as directed. (FOR ICD-10 E10.9, E11.9)., Disp: 1 each, Rfl: 0 .  diazepam (VALIUM) 5 MG tablet, TAKE 1 TABLET EVERY 12 HOURS AS NEEDED FOR ANXIETY, Disp: 30 tablet, Rfl: 2 .  escitalopram (LEXAPRO) 10 MG tablet, Take 1 tablet (10 mg total) by mouth daily., Disp: 30 tablet, Rfl: 5 .  fluticasone (FLONASE) 50 MCG/ACT nasal spray, Place 2 sprays into the nose daily as needed for rhinitis or allergies., Disp: , Rfl:  .  ibuprofen (ADVIL,MOTRIN) 200 MG tablet, Take 400-800 mg by mouth every 6 (six) hours as needed (For headache or pain.)., Disp: , Rfl:  .  lisinopril (PRINIVIL,ZESTRIL) 40 MG tablet, Take 1 tablet (40 mg total) by mouth daily., Disp: 90 tablet, Rfl: 1 .  metFORMIN (GLUCOPHAGE) 1000 MG tablet, Take 1 tablet (1,000 mg total) by mouth 2 (two) times daily with a meal., Disp: 180 tablet, Rfl: 1 .  metoprolol tartrate (LOPRESSOR) 25 MG tablet, Take 1 tablet (25 mg total) by mouth 2 (two) times daily., Disp: 180 tablet, Rfl: 1 .  Multiple Vitamin (MULTIVITAMIN WITH MINERALS) TABS tablet, Take 1  tablet by mouth daily., Disp: , Rfl:  .  naproxen (NAPROSYN) 500 MG tablet, Take 1 tablet (500 mg total) by mouth 2 (two) times daily with a meal., Disp: 60 tablet, Rfl: 1 .  nitroGLYCERIN (NITROSTAT) 0.4 MG SL tablet, Place 1 tablet (0.4 mg total) under the tongue every 5 (five) minutes as needed for chest pain., Disp: 25 tablet, Rfl: 3 .  omega-3 acid ethyl esters (LOVAZA) 1 g capsule, Take 1 capsule (1 g total) by mouth 2 (two) times daily., Disp: 180 capsule, Rfl: 1 .  omeprazole (PRILOSEC) 20 MG capsule, Take 1 capsule (20 mg total) by mouth daily as needed (for acid reflux). Acid reflux., Disp: 9 capsule, Rfl: 1 .  ONETOUCH DELICA LANCETS 01U MISC, EVERY DAY, Disp: 100 each, Rfl: 11 .  ONETOUCH VERIO test strip, TWICE DAILY, Disp: 100 each, Rfl:  11 .  Probiotic Product (PROBIOTIC PO), Take 1 capsule by mouth daily., Disp: , Rfl:  .  VENTOLIN HFA 108 (90 Base) MCG/ACT inhaler, USE 2 PUFFS 4 TIMES DAILY AS NEEDED, Disp: 18 g, Rfl: 2  Assessment/ Plan: 53 y.o. male   1. Strep pharyngitis - amoxicillin (AMOXIL) 500 MG capsule; Take 1 capsule (500 mg total) by mouth 3 (three) times daily.  Dispense: 30 capsule; Refill: 0   Start time: 2:10 PM End time: 2:18 PM  No orders of the defined types were placed in this encounter.   Particia Nearing PA-C [place] [phone]

## 2018-11-06 LAB — HM DIABETES EYE EXAM

## 2018-11-10 ENCOUNTER — Ambulatory Visit: Payer: 59 | Admitting: Family Medicine

## 2018-11-10 ENCOUNTER — Encounter: Payer: Self-pay | Admitting: Family Medicine

## 2018-11-10 ENCOUNTER — Ambulatory Visit (INDEPENDENT_AMBULATORY_CARE_PROVIDER_SITE_OTHER): Payer: 59

## 2018-11-10 ENCOUNTER — Other Ambulatory Visit: Payer: Self-pay

## 2018-11-10 VITALS — BP 124/73 | HR 65 | Temp 98.1°F | Ht 70.0 in | Wt 226.0 lb

## 2018-11-10 DIAGNOSIS — Z9889 Other specified postprocedural states: Secondary | ICD-10-CM | POA: Diagnosis not present

## 2018-11-10 DIAGNOSIS — M542 Cervicalgia: Secondary | ICD-10-CM | POA: Diagnosis not present

## 2018-11-10 DIAGNOSIS — W19XXXA Unspecified fall, initial encounter: Secondary | ICD-10-CM | POA: Diagnosis not present

## 2018-11-10 NOTE — Progress Notes (Signed)
Subjective: CC: Fall PCP: Chevis Pretty, [name] Benjamin Hale is a 53 y.o. male presenting to clinic today for:  1.  Neck pain/fall Patient reports that he slipped and fell in the rain on Sunday.  He subsequently hit the back of his neck and head on a step rail of his vehicle.  He also reports that the door hit him on the right side of his head.  He denies any loss of consciousness, dizziness or balance issues.  He notes that he has some discomfort in the neck but nothing severe.  He has had some numbness in the fourth and fifth digits on the right hand that has subsequently turned into soreness now.  He has a history of C-spine surgery about 6 years ago where he had a metal plate placed for degenerative disc disease.  He has been taking Advil 800 mg at night which does seem to help but he does sometimes wake up in the [place] to change positions due to discomfort.  Pain is 1-2/10.   ROS: Per HPI  No Known Allergies Past Medical History:  Diagnosis Date   Anginal pain ([place])    Arthritis    "neck" (12/23/2012)   Asthma    Atrial flutter ([place])    a. s/p cervical spine surgery 4/14 => s/p DCCV;  b.  Echo 09/2012:  Mild LVH, EF 60-65%, Gr 2 DD.c. s/p atrial flutter ablation 12/23/2012 by Dr Rayann Heman   Cervical disc disease    s/p cervical spine surgery 4/14   COPD (chronic obstructive pulmonary disease) ([facility])    Diabetes mellitus without complication ([facility])    Exertional shortness of breath    GERD (gastroesophageal reflux disease)    Headache    "weekly" (12/23/2012)   HTN (hypertension)    Hyperlipidemia    Hypogonadism male    Migraines    "once q 3-4 months" (12/23/2012)    Current Outpatient Medications:    amLODipine (NORVASC) 10 MG tablet, Take 1 tablet (10 mg total) by mouth daily., Disp: 90 tablet, Rfl: 1   aspirin EC 81 MG tablet, Take 81 mg by mouth daily., Disp: , Rfl:    blood glucose meter kit and supplies, Dispense based on patient and  insurance preference. Use up to four times daily as directed. (FOR ICD-10 E10.9, E11.9)., Disp: 1 each, Rfl: 0   escitalopram (LEXAPRO) 10 MG tablet, Take 1 tablet (10 mg total) by mouth daily., Disp: 30 tablet, Rfl: 5   ibuprofen (ADVIL,MOTRIN) 200 MG tablet, Take 400-800 mg by mouth every 6 (six) hours as needed (For headache or pain.)., Disp: , Rfl:    lisinopril (PRINIVIL,ZESTRIL) 40 MG tablet, Take 1 tablet (40 mg total) by mouth daily., Disp: 90 tablet, Rfl: 1   metFORMIN (GLUCOPHAGE) 1000 MG tablet, Take 1 tablet (1,000 mg total) by mouth 2 (two) times daily with a meal., Disp: 180 tablet, Rfl: 1   metoprolol tartrate (LOPRESSOR) 25 MG tablet, Take 1 tablet (25 mg total) by mouth 2 (two) times daily., Disp: 180 tablet, Rfl: 1   Multiple Vitamin (MULTIVITAMIN WITH MINERALS) TABS tablet, Take 1 tablet by mouth daily., Disp: , Rfl:    nitroGLYCERIN (NITROSTAT) 0.4 MG SL tablet, Place 1 tablet (0.4 mg total) under the tongue every 5 (five) minutes as needed for chest pain., Disp: 25 tablet, Rfl: 3   omeprazole (PRILOSEC) 20 MG capsule, Take 1 capsule (20 mg total) by mouth daily as needed (for acid reflux). Acid reflux., Disp: 9 capsule, Rfl:  1   ONETOUCH DELICA LANCETS 31S MISC, EVERY DAY, Disp: 100 each, Rfl: 11   ONETOUCH VERIO test strip, TWICE DAILY, Disp: 100 each, Rfl: 11   Probiotic Product (PROBIOTIC PO), Take 1 capsule by mouth daily., Disp: , Rfl:    diazepam (VALIUM) 5 MG tablet, TAKE 1 TABLET EVERY 12 HOURS AS NEEDED FOR ANXIETY (Patient not taking: Reported on 11/10/2018), Disp: 30 tablet, Rfl: 2   fluticasone (FLONASE) 50 MCG/ACT nasal spray, Place 2 sprays into the nose daily as needed for rhinitis or allergies., Disp: , Rfl:    naproxen (NAPROSYN) 500 MG tablet, Take 1 tablet (500 mg total) by mouth 2 (two) times daily with a meal. (Patient not taking: Reported on 11/10/2018), Disp: 60 tablet, Rfl: 1   omega-3 acid ethyl esters (LOVAZA) 1 g capsule, Take 1 capsule (1 g  total) by mouth 2 (two) times daily. (Patient not taking: Reported on 11/10/2018), Disp: 180 capsule, Rfl: 1   VENTOLIN HFA 108 (90 Base) MCG/ACT inhaler, USE 2 PUFFS 4 TIMES DAILY AS NEEDED (Patient not taking: Reported on 11/10/2018), Disp: 18 g, Rfl: 2 Social History   Socioeconomic History   Marital status: Married    Spouse name: Not on file   Number of children: 1   Years of education: Not on file   Highest education level: Not on file  Occupational History   Not on file  Social Needs   Financial resource strain: Not on file   Food insecurity:    Worry: Not on file    Inability: Not on file   Transportation needs:    Medical: Not on file    Non-medical: Not on file  Tobacco Use   Smoking status: Former Smoker    Packs/day: 1.50    Years: 22.00    Pack years: 33.00    Types: Cigarettes    Last attempt to quit: 06/16/2005    Years since quitting: 13.4   Smokeless tobacco: Never Used  Substance and Sexual Activity   Alcohol use: Yes    Comment: 12/23/2012 previously drank heavily - quit that 8 yrs ago; might have a beer q 5 yr"   Drug use: No   Sexual activity: Yes  Lifestyle   Physical activity:    Days per week: Not on file    Minutes per session: Not on file   Stress: Not on file  Relationships   Social connections:    Talks on phone: Not on file    Gets together: Not on file    Attends religious service: Not on file    Active member of club or organization: Not on file    Attends meetings of clubs or organizations: Not on file    Relationship status: Not on file   Intimate partner violence:    Fear of current or ex partner: Not on file    Emotionally abused: Not on file    Physically abused: Not on file    Forced sexual activity: Not on file  Other Topics Concern   Not on file  Social History Narrative   Lives in [place] with his wife and 53 yr old son.  Works as copy Geneticist, molecular.  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   Family History  Problem  Relation Age of Onset   Lung cancer Mother        died @ 65   Brain cancer Father        died @ 23   Diabetes Maternal Uncle  Diabetes Maternal Grandfather     Objective: Office vital signs reviewed. BP 124/73    Pulse 65    Temp 98.1 F (36.7 C) (Oral)    Ht 5' 10" (1.778 m)    Wt 226 lb (102.5 kg)    BMI 32.43 kg/m   Physical Examination:  General: Awake, alert, well nourished, No acute distress HEENT: small soft tissue swelling noted over along the right anterior forehead.  No palpable bony deformities.  No significant discoloration or skin breakdown.  PERRLA, EOMI. MSK:   C-spine: Reduced range of motion in extension of the neck.  He has full flexion and rotation to the right.  He has about a 10 degree loss of active range of motion with rotation to the left.  No midline tenderness to palpation.  Minimal paraspinal muscle tenderness palpation along the right side.  No palpable bony abnormalities. Skin: dry; intact; no rashes or lesions  Dg Cervical Spine Complete  Result Date: 11/10/2018 CLINICAL DATA:  Patient fell backwards, striking the base of the skull and the neck. Neck pain. Initial encounter. Prior C5 through C7 fusion. EXAM: CERVICAL SPINE - COMPLETE 4+ VIEW COMPARISON:  MRI cervical spine 04/08/2017. Immediate postoperative LATERAL cervical spine x-ray 10/15/2012. FINDINGS: Prior C5 through C7 ACDF and interbody fusion. Solid C5-6 fusion. Incomplete C6-7 fusion. Anatomic alignment. No fractures. Slight straightening of the usual cervical lordosis. Disc spaces well-preserved. Facet joints anatomically aligned without significant degenerative changes. No significant bony foraminal stenoses are suspected, allowing for the degree of obliquity. No static evidence of instability. IMPRESSION: 1. No evidence of fracture or static signs of instability. 2. Prior C5 through C7 ACDF and interbody fusion. 3. Slight straightening of the usual lordosis which may reflect positioning  and/or spasm. Electronically Signed   By: Evangeline Dakin M.D.   On: 11/10/2018 16:29    Assessment/ Plan: 53 y.o. male   1. Neck pain, acute No red flag signs or symptoms on exam.  However, given history of C-spine surgery x-ray was obtained.  Personal review demonstrated some postsurgical changes but no appreciable dislocation or instability of hardware.  Radiology review confirms this.  Okay to continue ibuprofen since this seems to be helping.  We discussed reasons for return and referral.  He voiced good understanding will follow-up PRN - DG Cervical Spine Complete; Future  2. Fall, initial encounter - DG Cervical Spine Complete; Future  3. History of neck surgery - DG Cervical Spine Complete; Future   Orders Placed This Encounter  Procedures   DG Cervical Spine Complete    Standing Status:   Future    Standing Expiration Date:   01/10/2020    Order Specific Question:   Reason for Exam (SYMPTOM  OR DIAGNOSIS REQUIRED)    Answer:   fall/ hx of neck surgery    Order Specific Question:   Preferred imaging location?    Answer:   Internal   No orders of the defined types were placed in this encounter.    Janora Norlander, DO [place] [phone]

## 2018-11-10 NOTE — Patient Instructions (Signed)
No new fractures, dislocations.  Your hardware is intact and is not displaced.  You may continue the ibuprofen and as we discussed you can take this up to 3 times daily with meals if needed.  If you develop any worrisome symptoms or signs or the sensation in the fingers does not resolve over the next couple of weeks, you may absolutely give me a call and we will try and arrange reevaluation with your neurosurgeon in [place].   Cervical Sprain  A cervical sprain is a stretch or tear in the tissues that connect bones (ligaments) in the neck. Most neck (cervical) sprains get better in 4-6 weeks. Follow these instructions at home: If you have a neck collar:  Wear it as told by your doctor. Do not take off (do not remove) the collar unless your doctor says that this is safe.  Ask your doctor before adjusting your collar.  If you have long hair, keep it outside of the collar.  Ask your doctor if you may take off the collar for cleaning and bathing. If you may take off the collar: ? Follow instructions from your doctor about how to take off the collar safely. ? Clean the collar by wiping it with mild soap and water. Let it air-dry all the way. ? If your collar has removable pads:  Take the pads out every 1-2 days.  Hand wash the pads with soap and water.  Let the pads air-dry all the way before you put them back in the collar. Do not dry them in a clothes dryer. Do not dry them with a hair dryer. ? Check your skin under the collar for irritation or sores. If you see any, tell your doctor. Managing pain, stiffness, and swelling   Use a cervical traction device, if told by your doctor.  If told, put heat on the affected area. Do this before exercises (physical therapy) or as often as told by your doctor. Use the heat source that your doctor recommends, such as a moist heat pack or a heating pad. ? Place a towel between your skin and the heat source. ? Leave the heat on for 20-30 minutes.  ? Take the heat off (remove the heat) if your skin turns bright red. This is very important if you cannot feel pain, heat, or cold. You may have a greater risk of getting burned.  Put ice on the affected area. ? Put ice in a plastic bag. ? Place a towel between your skin and the bag. ? Leave the ice on for 20 minutes, 2-3 times a day. Activity  Do not drive while wearing a neck collar. If you do not have a neck collar, ask your doctor if it is safe to drive.  Do not drive or use heavy machinery while taking prescription pain medicine or muscle relaxants, unless your doctor approves.  Do not lift anything that is heavier than 10 lb (4.5 kg) until your doctor tells you that it is safe.  Rest as told by your doctor.  Avoid activities that make you feel worse. Ask your doctor what activities are safe for you.  Do exercises as told by your doctor or physical therapist. Preventing neck sprain  Practice good posture. Adjust your workstation to help with this, if needed.  Exercise regularly as told by your doctor or physical therapist.  Avoid activities that are risky or may cause a neck sprain (cervical sprain). General instructions  Take over-the-counter and prescription medicines only as told  by your doctor.  Do not use any products that contain nicotine or tobacco. This includes cigarettes and e-cigarettes. If you need help quitting, ask your doctor.  Keep all follow-up visits as told by your doctor. This is important. Contact a doctor if:  You have pain or other symptoms that get worse.  You have symptoms that do not get better after 2 weeks.  You have pain that does not get better with medicine.  You start to have new, unexplained symptoms.  You have sores or irritated skin from wearing your neck collar. Get help right away if:  You have very bad pain.  You have any of the following in any part of your body: ? Loss of feeling (numbness). ? Tingling. ? Weakness.   You cannot move a part of your body (you have paralysis).  Your activity level does not improve. Summary  A cervical sprain is a stretch or tear in the tissues that connect bones (ligaments) in the neck.  If you have a neck (cervical) collar, do not take off the collar unless your doctor says that this is safe.  Put ice on affected areas as told by your doctor.  Put heat on affected areas as told by your doctor.  Good posture and regular exercise can help prevent a neck sprain from happening again. This information is not intended to replace advice given to you by your health care provider. Make sure you discuss any questions you have with your health care provider. Document Released: 11/19/2007 Document Revised: 02/12/2016 Document Reviewed: 02/12/2016 Elsevier Interactive Patient Education  2019 [employer]

## 2018-11-18 ENCOUNTER — Ambulatory Visit: Payer: 59 | Admitting: Nurse Practitioner

## 2019-01-28 ENCOUNTER — Other Ambulatory Visit: Payer: Self-pay | Admitting: Nurse Practitioner

## 2019-01-28 DIAGNOSIS — F321 Major depressive disorder, single episode, moderate: Secondary | ICD-10-CM

## 2019-02-23 ENCOUNTER — Other Ambulatory Visit: Payer: Self-pay | Admitting: Nurse Practitioner

## 2019-02-23 DIAGNOSIS — F321 Major depressive disorder, single episode, moderate: Secondary | ICD-10-CM

## 2019-02-24 NOTE — Telephone Encounter (Signed)
MMM NTBS 30 days given 01/28/19 

## 2019-03-12 ENCOUNTER — Other Ambulatory Visit: Payer: Self-pay | Admitting: Nurse Practitioner

## 2019-03-12 DIAGNOSIS — E1121 Type 2 diabetes mellitus with diabetic nephropathy: Secondary | ICD-10-CM

## 2019-04-10 ENCOUNTER — Other Ambulatory Visit: Payer: Self-pay | Admitting: Nurse Practitioner

## 2019-04-10 DIAGNOSIS — F321 Major depressive disorder, single episode, moderate: Secondary | ICD-10-CM

## 2019-04-11 NOTE — Telephone Encounter (Signed)
MMM NTBS 30 days given 02/24/19

## 2019-05-14 ENCOUNTER — Other Ambulatory Visit: Payer: Self-pay | Admitting: Nurse Practitioner

## 2019-05-14 DIAGNOSIS — F321 Major depressive disorder, single episode, moderate: Secondary | ICD-10-CM

## 2019-06-08 ENCOUNTER — Other Ambulatory Visit: Payer: Self-pay | Admitting: Family

## 2019-06-08 ENCOUNTER — Other Ambulatory Visit: Payer: Self-pay | Admitting: Nurse Practitioner

## 2019-06-08 DIAGNOSIS — M5090 Cervical disc disorder, unspecified, unspecified cervical region: Secondary | ICD-10-CM

## 2019-06-08 DIAGNOSIS — F321 Major depressive disorder, single episode, moderate: Secondary | ICD-10-CM

## 2019-06-08 DIAGNOSIS — R531 Weakness: Secondary | ICD-10-CM

## 2019-06-08 DIAGNOSIS — R200 Anesthesia of skin: Secondary | ICD-10-CM

## 2019-06-13 NOTE — Telephone Encounter (Signed)
MMM NTBS 30 days given 05/16/19

## 2019-06-13 NOTE — Telephone Encounter (Signed)
Apt scheduled.  

## 2019-06-22 ENCOUNTER — Other Ambulatory Visit: Payer: Self-pay

## 2019-06-23 ENCOUNTER — Encounter: Payer: Self-pay | Admitting: Nurse Practitioner

## 2019-06-23 ENCOUNTER — Ambulatory Visit: Payer: 59 | Admitting: Nurse Practitioner

## 2019-06-23 VITALS — BP 133/82 | HR 61 | Temp 97.1°F | Resp 20 | Ht 70.0 in | Wt 218.0 lb

## 2019-06-23 DIAGNOSIS — I10 Essential (primary) hypertension: Secondary | ICD-10-CM | POA: Diagnosis not present

## 2019-06-23 DIAGNOSIS — K219 Gastro-esophageal reflux disease without esophagitis: Secondary | ICD-10-CM | POA: Diagnosis not present

## 2019-06-23 DIAGNOSIS — E1121 Type 2 diabetes mellitus with diabetic nephropathy: Secondary | ICD-10-CM | POA: Diagnosis not present

## 2019-06-23 DIAGNOSIS — E785 Hyperlipidemia, unspecified: Secondary | ICD-10-CM | POA: Diagnosis not present

## 2019-06-23 DIAGNOSIS — F321 Major depressive disorder, single episode, moderate: Secondary | ICD-10-CM

## 2019-06-23 LAB — BAYER DCA HB A1C WAIVED: HB A1C (BAYER DCA - WAIVED): 8.1 % — ABNORMAL HIGH (ref <7.0)

## 2019-06-23 MED ORDER — ESCITALOPRAM OXALATE 10 MG PO TABS: ORAL_TABLET | ORAL | 1 refills | Status: DC

## 2019-06-23 MED ORDER — LISINOPRIL 40 MG PO TABS: 40.0000 mg | ORAL_TABLET | Freq: Every day | ORAL | 1 refills | Status: DC

## 2019-06-23 MED ORDER — AMLODIPINE BESYLATE 10 MG PO TABS: 10.0000 mg | ORAL_TABLET | Freq: Every day | ORAL | 1 refills | Status: DC

## 2019-06-23 MED ORDER — OMEPRAZOLE 20 MG PO CPDR: 20.0000 mg | DELAYED_RELEASE_CAPSULE | Freq: Every day | ORAL | 1 refills | Status: DC | PRN

## 2019-06-23 MED ORDER — METOPROLOL TARTRATE 25 MG PO TABS: 25.0000 mg | ORAL_TABLET | Freq: Two times a day (BID) | ORAL | 1 refills | Status: DC

## 2019-06-23 MED ORDER — OMEGA-3-ACID ETHYL ESTERS 1 G PO CAPS: 1.0000 | ORAL_CAPSULE | Freq: Two times a day (BID) | ORAL | 1 refills | Status: DC

## 2019-06-23 MED ORDER — JANUMET 50-1000 MG PO TABS: 1.0000 | ORAL_TABLET | Freq: Two times a day (BID) | ORAL | 1 refills | Status: DC

## 2019-06-23 NOTE — Progress Notes (Signed)
Subjective:    Patient ID: Benjamin Hale, male    DOB: [birth year], 54 y.o.   MRN: [id number]   Chief Complaint: Medical Management of Chronic Issues    HPI:  1. Essential hypertension No c/o chest pain, sob or headache. Doe snot check blood pressure at home. BP Readings from Last 3 Encounters:  06/23/19 133/82  11/10/18 124/73  08/17/18 126/84     2. Controlled type 2 diabetes mellitus with diabetic nephropathy, without long-term current use of insulin (HCC) Fasting blood sugars are running high. He has not been watching diet at all. Denies any symptoms of low blood sugars. Lab Results  Component Value Date   HGBA1C 7.5 (H) 08/17/2018     3. Hyperlipidemia with target LDL less than 100 Does not watch diet and does very little exercise. Lab Results  Component Value Date   CHOL 113 08/17/2018   HDL 21 (L) 08/17/2018   LDLCALC 26 08/17/2018   TRIG 331 (H) 08/17/2018   CHOLHDL 5.4 (H) 08/17/2018     4. Gastroesophageal reflux disease without esophagitis Doing ok. On omeprazole.  5. depression Is on lexapro and is doing well. He denies any side effects Depression screen [facility] 2/9 06/23/2019 11/10/2018 08/17/2018  Decreased Interest 0 0 2  Down, Depressed, Hopeless 0 0 2  PHQ - 2 Score 0 0 4  Altered sleeping - 0 3  Tired, decreased energy - 0 3  Change in appetite - 0 1  Feeling bad or failure about yourself  - 0 0  Trouble concentrating - 0 0  Moving slowly or fidgety/restless - 0 0  Suicidal thoughts - 0 0  PHQ-9 Score - 0 11  Difficult doing work/chores - - -       Outpatient Encounter Medications as of 06/23/2019  Medication Sig  . amLODipine (NORVASC) 10 MG tablet Take 1 tablet (10 mg total) by mouth daily.  Marland Kitchen aspirin EC 81 MG tablet Take 81 mg by mouth daily.  . blood glucose meter kit and supplies Dispense based on patient and insurance preference. Use up to four times daily as directed. (FOR ICD-10 E10.9, E11.9).  . diazepam (VALIUM) 5 MG tablet  TAKE 1 TABLET EVERY 12 HOURS AS NEEDED FOR ANXIETY  . escitalopram (LEXAPRO) 10 MG tablet TAKE ONE (1) TABLET EACH DAY  . fluticasone (FLONASE) 50 MCG/ACT nasal spray Place 2 sprays into the nose daily as needed for rhinitis or allergies.  Marland Kitchen ibuprofen (ADVIL,MOTRIN) 200 MG tablet Take 400-800 mg by mouth every 6 (six) hours as needed (For headache or pain.).  Marland Kitchen lisinopril (PRINIVIL,ZESTRIL) 40 MG tablet Take 1 tablet (40 mg total) by mouth daily.  . metFORMIN (GLUCOPHAGE) 1000 MG tablet TAKE ONE TABLET TWICE A DAY WITH MEALS.  . Multiple Vitamin (MULTIVITAMIN WITH MINERALS) TABS tablet Take 1 tablet by mouth daily.  . naproxen (NAPROSYN) 500 MG tablet Take 1 tablet (500 mg total) by mouth 2 (two) times daily with a meal.  . nitroGLYCERIN (NITROSTAT) 0.4 MG SL tablet Place 1 tablet (0.4 mg total) under the tongue every 5 (five) minutes as needed for chest pain.  Marland Kitchen omega-3 acid ethyl esters (LOVAZA) 1 g capsule Take 1 capsule (1 g total) by mouth 2 (two) times daily.  Marland Kitchen omeprazole (PRILOSEC) 20 MG capsule Take 1 capsule (20 mg total) by mouth daily as needed (for acid reflux). Acid reflux.  Glory Rosebush DELICA LANCETS 92Z MISC EVERY DAY  . ONETOUCH VERIO test strip TWICE DAILY  .  Probiotic Product (PROBIOTIC PO) Take 1 capsule by mouth daily.  . VENTOLIN HFA 108 (90 Base) MCG/ACT inhaler USE 2 PUFFS 4 TIMES DAILY AS NEEDED  . metoprolol tartrate (LOPRESSOR) 25 MG tablet Take 1 tablet (25 mg total) by mouth 2 (two) times daily.     Past Surgical History:  Procedure Laterality Date  . ANTERIOR CERVICAL DECOMP/DISCECTOMY FUSION N/A 09/22/2012   Procedure: ANTERIOR CERVICAL DECOMPRESSION/DISCECTOMY FUSION 2 LEVELS;  Surgeon: Floyce Stakes, MD;  Location: [facility];  Service: Neurosurgery;  Laterality: N/A;  Cervical five-six Cervical six-seven Anterior cervical decompression/diskectomy/fusion  . ATRIAL FLUTTER ABLATION  12/23/2012   CTI ablation by Dr Rayann Heman  . ATRIAL FLUTTER ABLATION N/A  12/23/2012   Procedure: ATRIAL FLUTTER ABLATION;  Surgeon: Thompson Grayer, MD;  Location: [facility] C[facility] LAB;  Service: Cardiovascular;  Laterality: N/A;  . CARDIAC CATHETERIZATION N/A 11/16/2015   Procedure: Left Heart Cath and Coronary Angiography;  Surgeon: Leonie Man, MD;  Location: [place] CV LAB;  Service: Cardiovascular;  Laterality: N/A;  . CARDIOVERSION N/A 09/23/2012   Procedure: CARDIOVERSION;  Surgeon: Renella Cunas, MD;  Location: [place];  Service: Cardiovascular;  Laterality: N/A;  . TONSILLECTOMY  1973  . TREATMENT FISTULA ANAL  ~ 2007  . WISDOM TOOTH EXTRACTION      Family History  Problem Relation Age of Onset  . Lung cancer Mother        died @ 63  . Brain cancer Father        died @ 93  . Diabetes Maternal Uncle   . Diabetes Maternal Grandfather     New complaints: None today  Social history: Lives with wife  Controlled substance contract: n/a     Review of Systems  Constitutional: Negative for diaphoresis.  Eyes: Negative for pain.  Respiratory: Negative for shortness of breath.   Cardiovascular: Negative for chest pain, palpitations and leg swelling.  Gastrointestinal: Negative for abdominal pain.  Endocrine: Negative for polydipsia.  Skin: Negative for rash.  Neurological: Negative for dizziness, weakness and headaches.  Hematological: Does not bruise/bleed easily.  All other systems reviewed and are negative.      Objective:   Physical Exam Vitals and nursing note reviewed.  Constitutional:      Appearance: Normal appearance. He is well-developed.  HENT:     Head: Normocephalic.     Nose: Nose normal.  Eyes:     Pupils: Pupils are equal, round, and reactive to light.  Neck:     Thyroid: No thyroid mass or thyromegaly.     Vascular: No carotid bruit or JVD.     Trachea: Phonation normal.  Cardiovascular:     Rate and Rhythm: Normal rate and regular rhythm.  Pulmonary:     Effort: Pulmonary effort is normal. No respiratory distress.      Breath sounds: Normal breath sounds.  Abdominal:     General: Bowel sounds are normal.     Palpations: Abdomen is soft.     Tenderness: There is no abdominal tenderness.  Musculoskeletal:        General: Normal range of motion.     Cervical back: Normal range of motion and neck supple.  Lymphadenopathy:     Cervical: No cervical adenopathy.  Skin:    General: Skin is warm and dry.  Neurological:     Mental Status: He is alert and oriented to person, place, and time.  Psychiatric:        Behavior: Behavior normal.  Thought Content: Thought content normal.        Judgment: Judgment normal.     BP 133/82   Pulse 61   Temp (!) 97.1 F (36.2 C) (Temporal)   Resp 20   Ht '5\' 10"'$  (1.778 m)   Wt 218 lb (98.9 kg)   SpO2 93%   BMI 31.28 kg/m        Assessment & Plan:  Benjamin Hale comes in today with chief complaint of Medical Management of Chronic Issues   Diagnosis and orders addressed:  1. Essential hypertension Low sodium diet - CMP14+EGFR - lisinopril (ZESTRIL) 40 MG tablet; Take 1 tablet (40 mg total) by mouth daily.  Dispense: 90 tablet; Refill: 1 - metoprolol tartrate (LOPRESSOR) 25 MG tablet; Take 1 tablet (25 mg total) by mouth 2 (two) times daily.  Dispense: 180 tablet; Refill: 1 - amLODipine (NORVASC) 10 MG tablet; Take 1 tablet (10 mg total) by mouth daily.  Dispense: 90 tablet; Refill: 1  2. Controlled type 2 diabetes mellitus with diabetic nephropathy, without long-term current use of insulin ([place]) Stricter carb counting Stop metfomin changed to janumet Keep diary of fasting blood suagrs - hgba1c - Microalbumin / creatinine urine ratio - sitaGLIPtin-metformin (JANUMET) 50-1000 MG tablet; Take 1 tablet by mouth 2 (two) times daily with a meal.  Dispense: 90 tablet; Refill: 1  3. Hyperlipidemia with target LDL less than 100 Low fat diet - Lipid panel - omega-3 acid ethyl esters (LOVAZA) 1 g capsule; Take 1 capsule (1 g total) by mouth 2 (two)  times daily.  Dispense: 180 capsule; Refill: 1  4. Gastroesophageal reflux disease without esophagitis Avoid spicy foods Do not eat 2 hours prior to bedtime - omeprazole (PRILOSEC) 20 MG capsule; Take 1 capsule (20 mg total) by mouth daily as needed (for acid reflux). Acid reflux.  Dispense: 90 capsule; Refill: 1  5. Depression, major, single episode, moderate (HCC) Stress management - escitalopram (LEXAPRO) 10 MG tablet; TAKE ONE (1) TABLET EACH DAY  Dispense: 90 tablet; Refill: 1   Labs pending Health Maintenance reviewed Diet and exercise encouraged  Follow up plan: 3 months   Mary-Margaret Hassell Done, [name]

## 2019-06-23 NOTE — Patient Instructions (Signed)
Carbohydrate Counting for Diabetes Mellitus, Adult  Carbohydrate counting is a method of keeping track of how many carbohydrates you eat. Eating carbohydrates naturally increases the amount of sugar (glucose) in the blood. Counting how many carbohydrates you eat helps keep your blood glucose within normal limits, which helps you manage your diabetes (diabetes mellitus). It is important to know how many carbohydrates you can safely have in each meal. This is different for every person. A diet and nutrition specialist (registered dietitian) can help you make a meal plan and calculate how many carbohydrates you should have at each meal and snack. Carbohydrates are found in the following foods:  Grains, such as breads and cereals.  Dried beans and soy products.  Starchy vegetables, such as potatoes, peas, and corn.  Fruit and fruit juices.  Milk and yogurt.  Sweets and snack foods, such as cake, cookies, candy, chips, and soft drinks. How do I count carbohydrates? There are two ways to count carbohydrates in food. You can use either of the methods or a combination of both. Reading "Nutrition Facts" on packaged food The "Nutrition Facts" list is included on the labels of almost all packaged foods and beverages in the [place] It includes:  The serving size.  Information about nutrients in each serving, including the grams (g) of carbohydrate per serving. To use the "Nutrition Facts":  Decide how many servings you will have.  Multiply the number of servings by the number of carbohydrates per serving.  The resulting number is the total amount of carbohydrates that you will be having. Learning standard serving sizes of other foods When you eat carbohydrate foods that are not packaged or do not include "Nutrition Facts" on the label, you need to measure the servings in order to count the amount of carbohydrates:  Measure the foods that you will eat with a food scale or measuring cup, if  needed.  Decide how many standard-size servings you will eat.  Multiply the number of servings by 15. Most carbohydrate-rich foods have about 15 g of carbohydrates per serving. ? For example, if you eat 8 oz (170 g) of strawberries, you will have eaten 2 servings and 30 g of carbohydrates (2 servings x 15 g = 30 g).  For foods that have more than one food mixed, such as soups and casseroles, you must count the carbohydrates in each food that is included. The following list contains standard serving sizes of common carbohydrate-rich foods. Each of these servings has about 15 g of carbohydrates:   hamburger bun or  English muffin.   oz (15 mL) syrup.   oz (14 g) jelly.  1 slice of bread.  1 six-inch tortilla.  3 oz (85 g) cooked rice or pasta.  4 oz (113 g) cooked dried beans.  4 oz (113 g) starchy vegetable, such as peas, corn, or potatoes.  4 oz (113 g) hot cereal.  4 oz (113 g) mashed potatoes or  of a large baked potato.  4 oz (113 g) canned or frozen fruit.  4 oz (120 mL) fruit juice.  4-6 crackers.  6 chicken nuggets.  6 oz (170 g) unsweetened dry cereal.  6 oz (170 g) plain fat-free yogurt or yogurt sweetened with artificial sweeteners.  8 oz (240 mL) milk.  8 oz (170 g) fresh fruit or one small piece of fruit.  24 oz (680 g) popped popcorn. Example of carbohydrate counting Sample meal  3 oz (85 g) chicken breast.  6 oz (170 g)   brown rice.  4 oz (113 g) corn.  8 oz (240 mL) milk.  8 oz (170 g) strawberries with sugar-free whipped topping. Carbohydrate calculation 1. Identify the foods that contain carbohydrates: ? Rice. ? Corn. ? Milk. ? Strawberries. 2. Calculate how many servings you have of each food: ? 2 servings rice. ? 1 serving corn. ? 1 serving milk. ? 1 serving strawberries. 3. Multiply each number of servings by 15 g: ? 2 servings rice x 15 g = 30 g. ? 1 serving corn x 15 g = 15 g. ? 1 serving milk x 15 g = 15 g. ? 1  serving strawberries x 15 g = 15 g. 4. Add together all of the amounts to find the total grams of carbohydrates eaten: ? 30 g + 15 g + 15 g + 15 g = 75 g of carbohydrates total. Summary  Carbohydrate counting is a method of keeping track of how many carbohydrates you eat.  Eating carbohydrates naturally increases the amount of sugar (glucose) in the blood.  Counting how many carbohydrates you eat helps keep your blood glucose within normal limits, which helps you manage your diabetes.  A diet and nutrition specialist (registered dietitian) can help you make a meal plan and calculate how many carbohydrates you should have at each meal and snack. This information is not intended to replace advice given to you by your health care provider. Make sure you discuss any questions you have with your health care provider. Document Revised: 12/25/2016 Document Reviewed: 11/14/2015 Elsevier Patient Education  2020 Elsevier Inc.  

## 2019-06-24 ENCOUNTER — Telehealth: Payer: Self-pay | Admitting: *Deleted

## 2019-06-24 LAB — CMP14+EGFR
ALT: 66 IU/L — ABNORMAL HIGH (ref 0–44)
AST: 43 IU/L — ABNORMAL HIGH (ref 0–40)
Albumin/Globulin Ratio: 1.8 (ref 1.2–2.2)
Albumin: 4.6 g/dL (ref 3.8–4.9)
Alkaline Phosphatase: 70 IU/L (ref 39–117)
BUN/Creatinine Ratio: 15 (ref 9–20)
BUN: 12 mg/dL (ref 6–24)
Bilirubin Total: 0.6 mg/dL (ref 0.0–1.2)
CO2: 22 mmol/L (ref 20–29)
Calcium: 9.3 mg/dL (ref 8.7–10.2)
Chloride: 98 mmol/L (ref 96–106)
Creatinine, Ser: 0.81 mg/dL (ref 0.76–1.27)
GFR calc Af Amer: 117 mL/min/{1.73_m2} (ref >59)
GFR calc non Af Amer: 101 mL/min/{1.73_m2} (ref >59)
Globulin, Total: 2.6 g/dL (ref 1.5–4.5)
Glucose: 279 mg/dL — ABNORMAL HIGH (ref 65–99)
Potassium: 4.3 mmol/L (ref 3.5–5.2)
Sodium: 139 mmol/L (ref 134–144)
Total Protein: 7.2 g/dL (ref 6.0–8.5)

## 2019-06-24 LAB — LIPID PANEL
Chol/HDL Ratio: 6.7 ratio — ABNORMAL HIGH (ref 0.0–5.0)
Cholesterol, Total: 133 mg/dL (ref 100–199)
HDL: 20 mg/dL — ABNORMAL LOW (ref >39)
LDL Chol Calc (NIH): 29 mg/dL (ref 0–99)
Triglycerides: 625 mg/dL (ref 0–149)
VLDL Cholesterol Cal: 84 mg/dL — ABNORMAL HIGH (ref 5–40)

## 2019-06-24 NOTE — Telephone Encounter (Signed)
Janumet is not covered by pt insurance  Covered alternative formularies may include: Tradjenta, Jentadueto 2.10-998, Kombiglyze 12.10-998

## 2019-06-27 MED ORDER — JENTADUETO 2.5-1000 MG PO TABS: 1.0000 | ORAL_TABLET | Freq: Two times a day (BID) | ORAL | 1 refills | Status: DC

## 2019-06-27 MED ORDER — FENOFIBRATE 145 MG PO TABS: 145.0000 mg | ORAL_TABLET | Freq: Every day | ORAL | 1 refills | Status: DC

## 2019-06-27 NOTE — Addendum Note (Signed)
Addended by: [name] on: 06/27/2019 01:16 PM   Modules accepted: Orders

## 2019-06-27 NOTE — Telephone Encounter (Signed)
janumet changed to jentadueto 2x a day - insurance would not cover [employer]

## 2019-06-27 NOTE — Telephone Encounter (Signed)
Patient aware.

## 2019-07-15 ENCOUNTER — Other Ambulatory Visit: Payer: Self-pay | Admitting: Nurse Practitioner

## 2019-07-20 ENCOUNTER — Telehealth: Payer: Self-pay | Admitting: Nurse Practitioner

## 2019-07-20 DIAGNOSIS — E1121 Type 2 diabetes mellitus with diabetic nephropathy: Secondary | ICD-10-CM

## 2019-07-20 NOTE — Telephone Encounter (Signed)
Pt says the Jentadueto has caused sores in his mouth. His sugars have not been below 200 since he started it. He knows you are out of office today.

## 2019-07-22 MED ORDER — GLIMEPIRIDE 4 MG PO TABS: 4.0000 mg | ORAL_TABLET | Freq: Every day | ORAL | 3 refills | Status: DC

## 2019-07-22 MED ORDER — METFORMIN HCL 1000 MG PO TABS: 1000.0000 mg | ORAL_TABLET | Freq: Two times a day (BID) | ORAL | 3 refills | Status: DC

## 2019-07-22 NOTE — Telephone Encounter (Signed)
Stop jentodueto- change to metformin 100mg  bid AND AMARYL 4MG  DAILY- PRESCRIPTIONS SENT TO PHARMACY

## 2019-07-27 NOTE — Progress Notes (Deleted)
HPI: FU atrial flutter. He developed AFlutter with RVR after cervical decompression and discectomy for cervical spondylosis in April 2014. He was rate controlled with IV diltiazem. He did not convert on his own and underwent DCCV within 24 hours of onset. Patient had recurrent atrial flutter and underwent ablation in July 2014. Nuclear study May 2017 showed normal LV function.Cardiac catheterization June 2017 showed normal coronary arteries. Monitor April 2018 showed sinus with PACs. Since last seen 02/19/17,   Current Outpatient Medications  Medication Sig Dispense Refill  . amLODipine (NORVASC) 10 MG tablet Take 1 tablet (10 mg total) by mouth daily. 90 tablet 1  . aspirin EC 81 MG tablet Take 81 mg by mouth daily.    . blood glucose meter kit and supplies Dispense based on patient and insurance preference. Use up to four times daily as directed. (FOR ICD-10 E10.9, E11.9). 1 each 0  . diazepam (VALIUM) 5 MG tablet TAKE 1 TABLET EVERY 12 HOURS AS NEEDED FOR ANXIETY 30 tablet 2  . escitalopram (LEXAPRO) 10 MG tablet TAKE ONE (1) TABLET EACH DAY 90 tablet 1  . fenofibrate (TRICOR) 145 MG tablet Take 1 tablet (145 mg total) by mouth daily. 90 tablet 1  . fluticasone (FLONASE) 50 MCG/ACT nasal spray Place 2 sprays into the nose daily as needed for rhinitis or allergies.    Marland Kitchen glimepiride (AMARYL) 4 MG tablet Take 1 tablet (4 mg total) by mouth daily before breakfast. 30 tablet 3  . glucose blood (ONETOUCH VERIO) test strip Test BS QID Dx E11.9 100 each 11  . ibuprofen (ADVIL,MOTRIN) 200 MG tablet Take 400-800 mg by mouth every 6 (six) hours as needed (For headache or pain.).    Marland Kitchen lisinopril (ZESTRIL) 40 MG tablet Take 1 tablet (40 mg total) by mouth daily. 90 tablet 1  . metFORMIN (GLUCOPHAGE) 1000 MG tablet Take 1 tablet (1,000 mg total) by mouth 2 (two) times daily with a meal. 180 tablet 3  . metoprolol tartrate (LOPRESSOR) 25 MG tablet Take 1 tablet (25 mg total) by mouth 2 (two) times  daily. 180 tablet 1  . Multiple Vitamin (MULTIVITAMIN WITH MINERALS) TABS tablet Take 1 tablet by mouth daily.    . naproxen (NAPROSYN) 500 MG tablet Take 1 tablet (500 mg total) by mouth 2 (two) times daily with a meal. 60 tablet 1  . nitroGLYCERIN (NITROSTAT) 0.4 MG SL tablet Place 1 tablet (0.4 mg total) under the tongue every 5 (five) minutes as needed for chest pain. 25 tablet 3  . omega-3 acid ethyl esters (LOVAZA) 1 g capsule Take 1 capsule (1 g total) by mouth 2 (two) times daily. 180 capsule 1  . omeprazole (PRILOSEC) 20 MG capsule Take 1 capsule (20 mg total) by mouth daily as needed (for acid reflux). Acid reflux. 90 capsule 1  . ONETOUCH DELICA LANCETS 54M MISC EVERY DAY 100 each 11  . Probiotic Product (PROBIOTIC PO) Take 1 capsule by mouth daily.    . VENTOLIN HFA 108 (90 Base) MCG/ACT inhaler USE 2 PUFFS 4 TIMES DAILY AS NEEDED 18 g 2   No current facility-administered medications for this visit.     Past Medical History:  Diagnosis Date  . Anginal pain ([place])   . Arthritis    "neck" (12/23/2012)  . Asthma   . Atrial flutter ([place])    a. s/p cervical spine surgery 4/14 => s/p DCCV;  b.  Echo 09/2012:  Mild LVH, EF 60-65%, Gr 2 DD.c. s/p  atrial flutter ablation 12/23/2012 by Dr Rayann Heman  . Cervical disc disease    s/p cervical spine surgery 4/14  . COPD (chronic obstructive pulmonary disease) ([place])   . Diabetes mellitus without complication ([place])   . Exertional shortness of breath   . GERD (gastroesophageal reflux disease)   . Headache    "weekly" (12/23/2012)  . HTN (hypertension)   . Hyperlipidemia   . Hypogonadism male   . Migraines    "once q 3-4 months" (12/23/2012)    Past Surgical History:  Procedure Laterality Date  . ANTERIOR CERVICAL DECOMP/DISCECTOMY FUSION N/A 09/22/2012   Procedure: ANTERIOR CERVICAL DECOMPRESSION/DISCECTOMY FUSION 2 LEVELS;  Surgeon: Floyce Stakes, MD;  Location: [facility];  Service: Neurosurgery;  Laterality: N/A;  Cervical five-six  Cervical six-seven Anterior cervical decompression/diskectomy/fusion  . ATRIAL FLUTTER ABLATION  12/23/2012   CTI ablation by Dr Rayann Heman  . ATRIAL FLUTTER ABLATION N/A 12/23/2012   Procedure: ATRIAL FLUTTER ABLATION;  Surgeon: Thompson Grayer, MD;  Location[facility] CATH LAB;  Service: Cardiovascular;  Laterality: N/A;  . CARDIAC CATHETERIZATION N/A 11/16/2015   Procedure: Left Heart Cath and Coronary Angiography;  Surgeon: Leonie Man, MD;  Location: [place] CV LAB;  Service: Cardiovascular;  Laterality: N/A;  . CARDIOVERSION N/A 09/23/2012   Procedure: CARDIOVERSION;  Surgeon: Renella Cunas, MD;  Location: [place];  Service: Cardiovascular;  Laterality: N/A;  . TONSILLECTOMY  1973  . TREATMENT FISTULA ANAL  ~ 2007  . WISDOM TOOTH EXTRACTION      Social History   Socioeconomic History  . Marital status: Married    Spouse name: Not on file  . Number of children: 1  . Years of education: Not on file  . Highest education level: Not on file  Occupational History  . Not on file  Tobacco Use  . Smoking status: Former Smoker    Packs/day: 1.50    Years: 22.00    Pack years: 33.00    Types: Cigarettes    Quit date: 06/16/2005    Years since quitting: 14.1  . Smokeless tobacco: Never Used  Substance and Sexual Activity  . Alcohol use: Yes    Comment: 12/23/2012 previously drank heavily - quit that 8 yrs ago; might have a beer q 5 yr"  . Drug use: No  . Sexual activity: Yes  Other Topics Concern  . Not on file  Social History Narrative   Lives in [place] with his wife and 38 yr old son.  Works as copy Geneticist, molecular.  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   Social Determinants of Health   Financial Resource Strain:   . Difficulty of Paying Living Expenses: Not on file  Food Insecurity:   . Worried About Charity fundraiser in the Last Year: Not on file  . Ran Out of Food in the Last Year: Not on file  Transportation Needs:   . Lack of Transportation (Medical): Not on file  . Lack of  Transportation (Non-Medical): Not on file  Physical Activity:   . Days of Exercise per Week: Not on file  . Minutes of Exercise per Session: Not on file  Stress:   . Feeling of Stress : Not on file  Social Connections:   . Frequency of Communication with Friends and Family: Not on file  . Frequency of Social Gatherings with Friends and Family: Not on file  . Attends Religious Services: Not on file  . Active Member of Clubs or Organizations: Not on file  .  Attends Archivist Meetings: Not on file  . Marital Status: Not on file  Intimate Partner Violence:   . Fear of Current or Ex-Partner: Not on file  . Emotionally Abused: Not on file  . Physically Abused: Not on file  . Sexually Abused: Not on file    Family History  Problem Relation Age of Onset  . Lung cancer Mother        died @ 62  . Brain cancer Father        died @ 55  . Diabetes Maternal Uncle   . Diabetes Maternal Grandfather     ROS: no fevers or chills, productive cough, hemoptysis, dysphasia, odynophagia, melena, hematochezia, dysuria, hematuria, rash, seizure activity, orthopnea, PND, pedal edema, claudication. Remaining systems are negative.  Physical Exam: Well-developed well-nourished in no acute distress.  Skin is warm and dry.  HEENT is normal.  Neck is supple.  Chest is clear to auscultation with normal expansion.  Cardiovascular exam is regular rate and rhythm.  Abdominal exam nontender or distended. No masses palpated. Extremities show no edema. neuro grossly intact  ECG- personally reviewed  A/P  1 atrial flutter-previous ablation with no documented recurrences.  2 history of palpitations-previous monitor showed PACs.  Continue metoprolol at present dose.  3 hypertension-blood pressure controlled.  Continue present medical regimen.  4 hyperlipidemia-Per primary care.  Kirk Ruths, MD

## 2019-07-28 ENCOUNTER — Ambulatory Visit: Payer: 59 | Admitting: Cardiology

## 2019-08-15 ENCOUNTER — Ambulatory Visit: Payer: 59 | Attending: Internal Medicine

## 2019-08-15 ENCOUNTER — Other Ambulatory Visit: Payer: Self-pay

## 2019-08-15 DIAGNOSIS — Z20822 Contact with and (suspected) exposure to covid-19: Secondary | ICD-10-CM

## 2019-08-17 ENCOUNTER — Telehealth: Payer: Self-pay | Admitting: *Deleted

## 2019-08-17 LAB — NOVEL CORONAVIRUS, NAA: SARS-CoV-2, NAA: DETECTED — AB

## 2019-08-17 NOTE — Telephone Encounter (Signed)
Pt called for result of COVID test obtained 08/15/19; explained that test has not resulted, and the turn around time is based on the number of tests to be completed; pt advised results are available first in MyChart, and he will receive a call regarding results; he was encouraged to answer all calls; he verbalized understanding.

## 2019-08-18 ENCOUNTER — Other Ambulatory Visit: Payer: Self-pay | Admitting: Physician Assistant

## 2019-08-18 ENCOUNTER — Ambulatory Visit (HOSPITAL_COMMUNITY)
Admission: RE | Admit: 2019-08-18 | Discharge: 2019-08-18 | Disposition: A | Discharge status: Home / Self Care | Payer: 59 | Source: Ambulatory Visit | Attending: Pulmonary Disease | Admitting: Pulmonary Disease

## 2019-08-18 ENCOUNTER — Telehealth: Payer: Self-pay | Admitting: Physician Assistant

## 2019-08-18 DIAGNOSIS — E1121 Type 2 diabetes mellitus with diabetic nephropathy: Secondary | ICD-10-CM

## 2019-08-18 DIAGNOSIS — I10 Essential (primary) hypertension: Secondary | ICD-10-CM

## 2019-08-18 DIAGNOSIS — U071 COVID-19: Secondary | ICD-10-CM

## 2019-08-18 MED ORDER — SODIUM CHLORIDE 0.9 % IV SOLN
700.0000 mg | Freq: Once | INTRAVENOUS | Status: DC
  Filled 2019-08-18: qty 20

## 2019-08-18 NOTE — Progress Notes (Signed)
  I connected by phone with Benjamin Hale on 08/18/2019 at 9:07 AM to discuss the potential use of an new treatment for mild to moderate COVID-19 viral infection in non-hospitalized patients.  This patient is a 54 y.o. male that meets the FDA criteria for Emergency Use Authorization of bamlanivimab or casirivimab\imdevimab.  Has a (+) direct SARS-CoV-2 viral test result  Has mild or moderate COVID-19   Is ? 54 years of age and weighs ? 40 kg  Is NOT hospitalized due to COVID-19  Is NOT requiring oxygen therapy or requiring an increase in baseline oxygen flow rate due to COVID-19  Is within 10 days of symptom onset  Has at least one of the high risk factor(s) for progression to severe COVID-19 and/or hospitalization as defined in EUA.  Specific high risk criteria : Diabetes   I have spoken and communicated the following to the patient or parent/caregiver:  1. FDA has authorized the emergency use of bamlanivimab and casirivimab\imdevimab for the treatment of mild to moderate COVID-19 in adults and pediatric patients with positive results of direct SARS-CoV-2 viral testing who are 91 years of age and older weighing at least 40 kg, and who are at high risk for progressing to severe COVID-19 and/or hospitalization.  2. The significant known and potential risks and benefits of bamlanivimab and casirivimab\imdevimab, and the extent to which such potential risks and benefits are unknown.  3. Information on available alternative treatments and the risks and benefits of those alternatives, including clinical trials.  4. Patients treated with bamlanivimab and casirivimab\imdevimab should continue to self-isolate and use infection control measures (e.g., wear mask, isolate, social distance, avoid sharing personal items, clean and disinfect "high touch" surfaces, and frequent handwashing) according to CDC guidelines.   5. The patient or parent/caregiver has the option to accept or refuse  bamlanivimab or casirivimab\imdevimab .  After reviewing this information with the patient, The patient agreed to proceed with receiving the bamlanimivab infusion and will be provided a copy of the Fact sheet prior to receiving the infusion.   Sx onset 2/27. Infusion set up for today at 12:30. Directions given.   Cline Crock PA-C 08/18/2019 9:07 AM

## 2019-08-18 NOTE — Telephone Encounter (Signed)
Patient later called back and decided he wanted to cancel infusion. He has our number if he changes his mind at a later time.   Cline Crock PA-C  [facility]

## 2019-09-20 ENCOUNTER — Other Ambulatory Visit: Payer: Self-pay

## 2019-09-20 ENCOUNTER — Other Ambulatory Visit: Payer: 59

## 2019-09-20 DIAGNOSIS — I10 Essential (primary) hypertension: Secondary | ICD-10-CM

## 2019-09-20 DIAGNOSIS — E785 Hyperlipidemia, unspecified: Secondary | ICD-10-CM

## 2019-09-20 DIAGNOSIS — E1121 Type 2 diabetes mellitus with diabetic nephropathy: Secondary | ICD-10-CM

## 2019-09-20 LAB — BAYER DCA HB A1C WAIVED: HB A1C (BAYER DCA - WAIVED): 6.7 % (ref <7.0)

## 2019-09-21 LAB — LIPID PANEL
Chol/HDL Ratio: 6.3 ratio — ABNORMAL HIGH (ref 0.0–5.0)
Cholesterol, Total: 125 mg/dL (ref 100–199)
HDL: 20 mg/dL — ABNORMAL LOW (ref >39)
LDL Chol Calc (NIH): 61 mg/dL (ref 0–99)
Triglycerides: 272 mg/dL — ABNORMAL HIGH (ref 0–149)
VLDL Cholesterol Cal: 44 mg/dL — ABNORMAL HIGH (ref 5–40)

## 2019-09-21 LAB — CMP14+EGFR
ALT: 88 IU/L — ABNORMAL HIGH (ref 0–44)
AST: 64 IU/L — ABNORMAL HIGH (ref 0–40)
Albumin/Globulin Ratio: 1.6 (ref 1.2–2.2)
Albumin: 4.1 g/dL (ref 3.8–4.9)
Alkaline Phosphatase: 57 IU/L (ref 39–117)
BUN/Creatinine Ratio: 14 (ref 9–20)
BUN: 11 mg/dL (ref 6–24)
Bilirubin Total: 0.5 mg/dL (ref 0.0–1.2)
CO2: 22 mmol/L (ref 20–29)
Calcium: 8.8 mg/dL (ref 8.7–10.2)
Chloride: 104 mmol/L (ref 96–106)
Creatinine, Ser: 0.78 mg/dL (ref 0.76–1.27)
GFR calc Af Amer: 119 mL/min/{1.73_m2} (ref >59)
GFR calc non Af Amer: 103 mL/min/{1.73_m2} (ref >59)
Globulin, Total: 2.5 g/dL (ref 1.5–4.5)
Glucose: 179 mg/dL — ABNORMAL HIGH (ref 65–99)
Potassium: 4.3 mmol/L (ref 3.5–5.2)
Sodium: 142 mmol/L (ref 134–144)
Total Protein: 6.6 g/dL (ref 6.0–8.5)

## 2019-09-21 LAB — CBC WITH DIFFERENTIAL/PLATELET
Basophils Absolute: 0.1 10*3/uL (ref 0.0–0.2)
Basos: 1 %
EOS (ABSOLUTE): 0.2 10*3/uL (ref 0.0–0.4)
Eos: 3 %
Hematocrit: 44.3 % (ref 37.5–51.0)
Hemoglobin: 15 g/dL (ref 13.0–17.7)
Immature Grans (Abs): 0 10*3/uL (ref 0.0–0.1)
Immature Granulocytes: 0 %
Lymphocytes Absolute: 2.5 10*3/uL (ref 0.7–3.1)
Lymphs: 32 %
MCH: 30.8 pg (ref 26.6–33.0)
MCHC: 33.9 g/dL (ref 31.5–35.7)
MCV: 91 fL (ref 79–97)
Monocytes Absolute: 0.7 10*3/uL (ref 0.1–0.9)
Monocytes: 9 %
Neutrophils Absolute: 4.3 10*3/uL (ref 1.4–7.0)
Neutrophils: 55 %
Platelets: 252 10*3/uL (ref 150–450)
RBC: 4.87 x10E6/uL (ref 4.14–5.80)
RDW: 13.2 % (ref 11.6–15.4)
WBC: 7.9 10*3/uL (ref 3.4–10.8)

## 2019-09-22 ENCOUNTER — Encounter: Payer: Self-pay | Admitting: Nurse Practitioner

## 2019-09-22 ENCOUNTER — Ambulatory Visit (INDEPENDENT_AMBULATORY_CARE_PROVIDER_SITE_OTHER): Payer: 59 | Admitting: Nurse Practitioner

## 2019-09-22 DIAGNOSIS — I10 Essential (primary) hypertension: Secondary | ICD-10-CM

## 2019-09-22 DIAGNOSIS — F321 Major depressive disorder, single episode, moderate: Secondary | ICD-10-CM

## 2019-09-22 DIAGNOSIS — E1121 Type 2 diabetes mellitus with diabetic nephropathy: Secondary | ICD-10-CM | POA: Diagnosis not present

## 2019-09-22 DIAGNOSIS — K219 Gastro-esophageal reflux disease without esophagitis: Secondary | ICD-10-CM

## 2019-09-22 DIAGNOSIS — E785 Hyperlipidemia, unspecified: Secondary | ICD-10-CM | POA: Diagnosis not present

## 2019-09-22 MED ORDER — METFORMIN HCL 1000 MG PO TABS: 1000.0000 mg | ORAL_TABLET | Freq: Two times a day (BID) | ORAL | 3 refills | Status: DC

## 2019-09-22 MED ORDER — OMEPRAZOLE 20 MG PO CPDR: 20.0000 mg | DELAYED_RELEASE_CAPSULE | Freq: Every day | ORAL | 1 refills | Status: DC | PRN

## 2019-09-22 MED ORDER — AMLODIPINE BESYLATE 10 MG PO TABS: 10.0000 mg | ORAL_TABLET | Freq: Every day | ORAL | 1 refills | Status: DC

## 2019-09-22 MED ORDER — OMEGA-3-ACID ETHYL ESTERS 1 G PO CAPS: 1.0000 | ORAL_CAPSULE | Freq: Two times a day (BID) | ORAL | 1 refills | Status: DC

## 2019-09-22 MED ORDER — ESCITALOPRAM OXALATE 10 MG PO TABS: ORAL_TABLET | ORAL | 1 refills | Status: DC

## 2019-09-22 MED ORDER — LISINOPRIL 40 MG PO TABS: 40.0000 mg | ORAL_TABLET | Freq: Every day | ORAL | 1 refills | Status: DC

## 2019-09-22 MED ORDER — METOPROLOL TARTRATE 25 MG PO TABS: 25.0000 mg | ORAL_TABLET | Freq: Two times a day (BID) | ORAL | 1 refills | Status: DC

## 2019-09-22 MED ORDER — FENOFIBRATE 145 MG PO TABS: 145.0000 mg | ORAL_TABLET | Freq: Every day | ORAL | 1 refills | Status: DC

## 2019-09-22 MED ORDER — GLIMEPIRIDE 4 MG PO TABS: 4.0000 mg | ORAL_TABLET | Freq: Every day | ORAL | 1 refills | Status: DC

## 2019-09-22 NOTE — Progress Notes (Signed)
Virtual Visit via telephone Note Due to COVID-19 pandemic this visit was conducted virtually. This visit type was conducted due to national recommendations for restrictions regarding the COVID-19 Pandemic (e.g. social distancing, sheltering in place) in an effort to limit this patient's exposure and mitigate transmission in our community. All issues noted in this document were discussed and addressed.  A physical exam was not performed with this format.  I connected with Benjamin Hale on 09/22/19 at 8:15 by telephone and verified that I am speaking with the correct person using two identifiers. Benjamin Hale is currently located at work and no one is currently with him during visit. The provider, Mary-Margaret Hassell Done, [name] is located in their office at time of visit.  I discussed the limitations, risks, security and privacy concerns of performing an evaluation and management service by telephone and the availability of in person appointments. I also discussed with the patient that there may be a patient responsible charge related to this service. The patient expressed understanding and agreed to proceed.   History and Present Illness:   Chief Complaint: Medical Management of Chronic Issues    HPI:  1. Essential hypertension No c/o chest pain, sob or headache. Does not check blood pressure at home. BP Readings from Last 3 Encounters:  06/23/19 133/82  11/10/18 124/73  08/17/18 126/84   2. Hyperlipidemia with target LDL less than 100 Does not watch diet and does very little exercsie. Lab Results  Component Value Date   CHOL 125 09/20/2019   HDL 20 (L) 09/20/2019   LDLCALC 61 09/20/2019   TRIG 272 (H) 09/20/2019   CHOLHDL 6.3 (H) 09/20/2019     3. Controlled type 2 diabetes mellitus with diabetic nephropathy, without long-term current use of insulin (HCC) He checks his blood sugars most mornings and usually runs below 130. He has had no low blood sugars. Lab Results    Component Value Date   HGBA1C 6.7 09/20/2019     4. Depression, major, single episode, moderate ([place]) Is doing well. Is on lexapro and is doing well. Depression screen [facility] 2/9 09/22/2019 06/23/2019 11/10/2018 08/17/2018 07/19/2018  Decreased Interest 0 0 0 2 0  Down, Depressed, Hopeless 0 0 0 2 0  PHQ - 2 Score 0 0 0 4 0  Altered sleeping - - 0 3 -  Tired, decreased energy - - 0 3 -  Change in appetite - - 0 1 -  Feeling bad or failure about yourself  - - 0 0 -  Trouble concentrating - - 0 0 -  Moving slowly or fidgety/restless - - 0 0 -  Suicidal thoughts - - 0 0 -  PHQ-9 Score - - 0 11 -  Difficult doing work/chores - - - - -     5. Gastroesophageal reflux disease without esophagitis Is on omeprazole daily and is doing well.     Outpatient Encounter Medications as of 09/22/2019  Medication Sig   amLODipine (NORVASC) 10 MG tablet Take 1 tablet (10 mg total) by mouth daily.   aspirin EC 81 MG tablet Take 81 mg by mouth daily.   blood glucose meter kit and supplies Dispense based on patient and insurance preference. Use up to four times daily as directed. (FOR ICD-10 E10.9, E11.9).   diazepam (VALIUM) 5 MG tablet TAKE 1 TABLET EVERY 12 HOURS AS NEEDED FOR ANXIETY   escitalopram (LEXAPRO) 10 MG tablet TAKE ONE (1) TABLET EACH DAY   fenofibrate (TRICOR) 145 MG tablet Take  1 tablet (145 mg total) by mouth daily.   fluticasone (FLONASE) 50 MCG/ACT nasal spray Place 2 sprays into the nose daily as needed for rhinitis or allergies.   glimepiride (AMARYL) 4 MG tablet Take 1 tablet (4 mg total) by mouth daily before breakfast.   glucose blood (ONETOUCH VERIO) test strip Test BS QID Dx E11.9   ibuprofen (ADVIL,MOTRIN) 200 MG tablet Take 400-800 mg by mouth every 6 (six) hours as needed (For headache or pain.).   lisinopril (ZESTRIL) 40 MG tablet Take 1 tablet (40 mg total) by mouth daily.   metFORMIN (GLUCOPHAGE) 1000 MG tablet Take 1 tablet (1,000 mg total) by mouth 2 (two) times  daily with a meal.   metoprolol tartrate (LOPRESSOR) 25 MG tablet Take 1 tablet (25 mg total) by mouth 2 (two) times daily.   Multiple Vitamin (MULTIVITAMIN WITH MINERALS) TABS tablet Take 1 tablet by mouth daily.   naproxen (NAPROSYN) 500 MG tablet Take 1 tablet (500 mg total) by mouth 2 (two) times daily with a meal.   nitroGLYCERIN (NITROSTAT) 0.4 MG SL tablet Place 1 tablet (0.4 mg total) under the tongue every 5 (five) minutes as needed for chest pain.   omega-3 acid ethyl esters (LOVAZA) 1 g capsule Take 1 capsule (1 g total) by mouth 2 (two) times daily.   omeprazole (PRILOSEC) 20 MG capsule Take 1 capsule (20 mg total) by mouth daily as needed (for acid reflux). Acid reflux.   ONETOUCH DELICA LANCETS 77O MISC EVERY DAY   Probiotic Product (PROBIOTIC PO) Take 1 capsule by mouth daily.   VENTOLIN HFA 108 (90 Base) MCG/ACT inhaler USE 2 PUFFS 4 TIMES DAILY AS NEEDED    Past Surgical History:  Procedure Laterality Date   ANTERIOR CERVICAL DECOMP/DISCECTOMY FUSION N/A 09/22/2012   Procedure: ANTERIOR CERVICAL DECOMPRESSION/DISCECTOMY FUSION 2 LEVELS;  Surgeon: Floyce Stakes, MD;  Location: [facility];  Service: Neurosurgery;  Laterality: N/A;  Cervical five-six Cervical six-seven Anterior cervical decompression/diskectomy/fusion   ATRIAL FLUTTER ABLATION  12/23/2012   CTI ablation by Dr Rayann Heman   ATRIAL FLUTTER ABLATION N/A 12/23/2012   Procedure: ATRIAL FLUTTER ABLATION;  Surgeon: Thompson Grayer, MD;  Location: [facility] CATH LAB;  Service: Cardiovascular;  Laterality: N/A;   CARDIAC CATHETERIZATION N/A 11/16/2015   Procedure: Left Heart Cath and Coronary Angiography;  Surgeon: Leonie Man, MD;  Location: [facility] CV LAB;  Service: Cardiovascular;  Laterality: N/A;   CARDIOVERSION N/A 09/23/2012   Procedure: CARDIOVERSION;  Surgeon: Renella Cunas, MD;  Location: [place];  Service: Cardiovascular;  Laterality: N/A;   TONSILLECTOMY  1973   TREATMENT FISTULA ANAL  ~ 2007   WISDOM  TOOTH EXTRACTION      Family History  Problem Relation Age of Onset   Lung cancer Mother        died @ 22   Brain cancer Father        died @ 38   Diabetes Maternal Uncle    Diabetes Maternal Grandfather     New complaints: None today  Social history: Live with wife and child  Controlled substance contract: n/a    Review of Systems  Constitutional: Negative for diaphoresis and weight loss.  Eyes: Negative for blurred vision, double vision and pain.  Respiratory: Negative for shortness of breath.   Cardiovascular: Negative for chest pain, palpitations, orthopnea and leg swelling.  Gastrointestinal: Negative for abdominal pain.  Skin: Negative for rash.  Neurological: Negative for dizziness, sensory change, loss of consciousness, weakness and  headaches.  Endo/Heme/Allergies: Negative for polydipsia. Does not bruise/bleed easily.  Psychiatric/Behavioral: Negative for memory loss. The patient does not have insomnia.   All other systems reviewed and are negative.    Observations/Objective: Alert and oriented- answers all questions appropriately No distress    Assessment and Plan: Benjamin Hale comes in today with chief complaint of Medical Management of Chronic Issues   Diagnosis and orders addressed:  1. Essential hypertension Low sodium diet - lisinopril (ZESTRIL) 40 MG tablet; Take 1 tablet (40 mg total) by mouth daily.  Dispense: 90 tablet; Refill: 1 - metoprolol tartrate (LOPRESSOR) 25 MG tablet; Take 1 tablet (25 mg total) by mouth 2 (two) times daily.  Dispense: 180 tablet; Refill: 1 - amLODipine (NORVASC) 10 MG tablet; Take 1 tablet (10 mg total) by mouth daily.  Dispense: 90 tablet; Refill: 1  2. Hyperlipidemia with target LDL less than 100 Low fat diet - omega-3 acid ethyl esters (LOVAZA) 1 g capsule; Take 1 capsule (1 g total) by mouth 2 (two) times daily.  Dispense: 180 capsule; Refill: 1  3. Controlled type 2 diabetes mellitus with diabetic  nephropathy, without long-term current use of insulin (HCC) Continue to watch carbs in diet - glimepiride (AMARYL) 4 MG tablet; Take 1 tablet (4 mg total) by mouth daily before breakfast.  Dispense: 90 tablet; Refill: 1 - metFORMIN (GLUCOPHAGE) 1000 MG tablet; Take 1 tablet (1,000 mg total) by mouth 2 (two) times daily with a meal.  Dispense: 180 tablet; Refill: 3  4. Depression, major, single episode, moderate (HCC) Stress management - escitalopram (LEXAPRO) 10 MG tablet; TAKE ONE (1) TABLET EACH DAY  Dispense: 90 tablet; Refill: 1  5. Gastroesophageal reflux disease without esophagitis Avoid spicy foods Do not eat 2 hours prior to bedtime - omeprazole (PRILOSEC) 20 MG capsule; Take 1 capsule (20 mg total) by mouth daily as needed (for acid reflux). Acid reflux.  Dispense: 90 capsule; Refill: 1   Labs pending Health Maintenance reviewed Diet and exercise encouraged   Follow Up Instructions: 3 months    I discussed the assessment and treatment plan with the patient. The patient was provided an opportunity to ask questions and all were answered. The patient agreed with the plan and demonstrated an understanding of the instructions.   The patient was advised to call back or seek an in-person evaluation if the symptoms worsen or if the condition fails to improve as anticipated.  The above assessment and management plan was discussed with the patient. The patient verbalized understanding of and has agreed to the management plan. Patient is aware to call the clinic if symptoms persist or worsen. Patient is aware when to return to the clinic for a follow-up visit. Patient educated on when it is appropriate to go to the emergency department.   Time call ended:  8:30  I provided 15 minutes of non-face-to-face time during this encounter.    Mary-Margaret Hassell Done, [name]

## 2019-10-20 IMAGING — DX DG WRIST COMPLETE 3+V*L*
3 series · 3 of 3 positions shown · non-contrast
Comparison: None.

CLINICAL DATA: Pain following fall

EXAM:
LEFT WRIST - COMPLETE 3+ VIEW

[wrist ap]
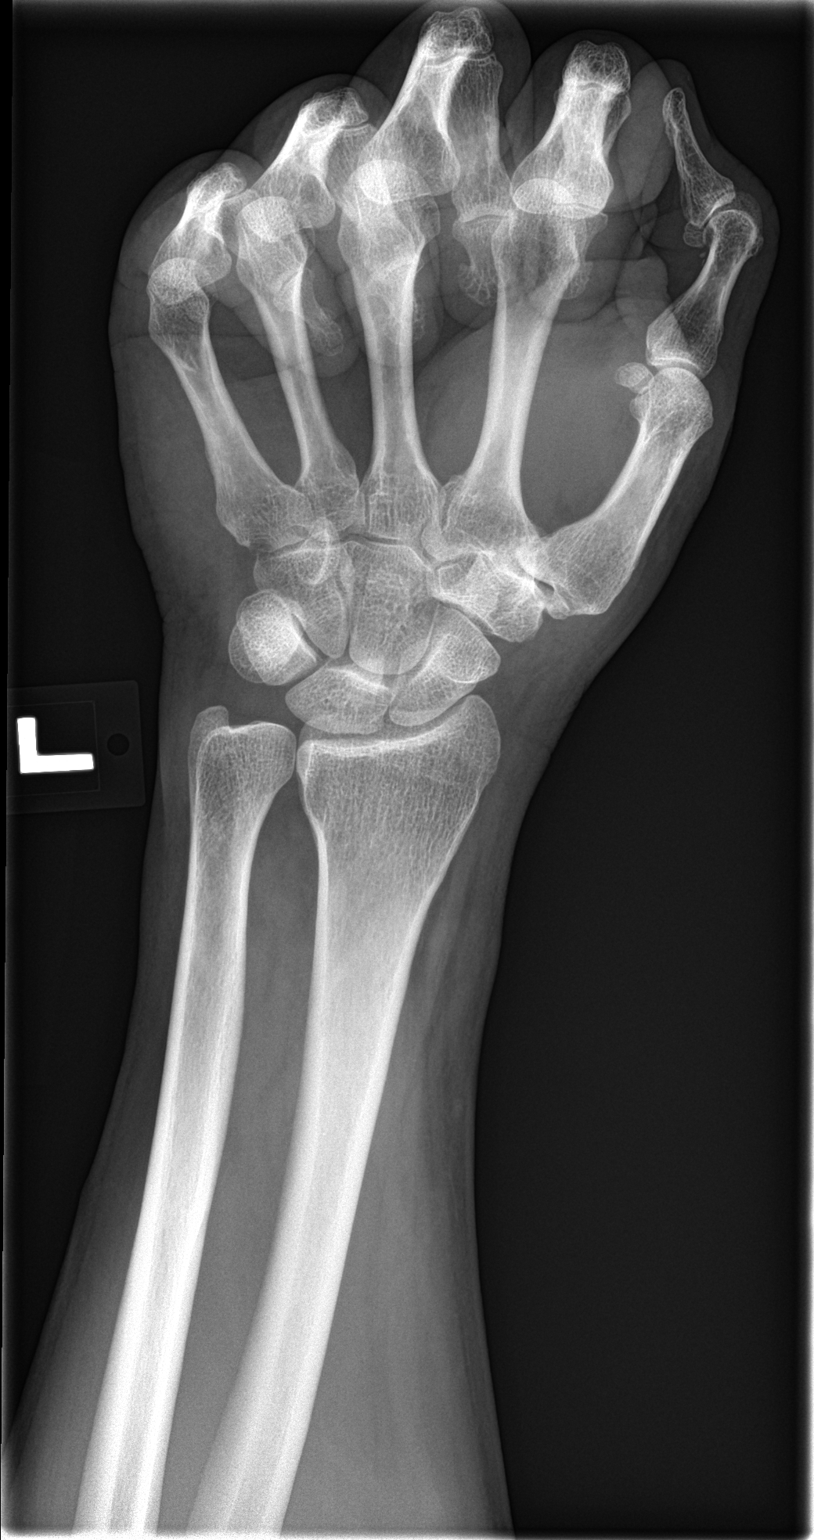

[wrist lat]
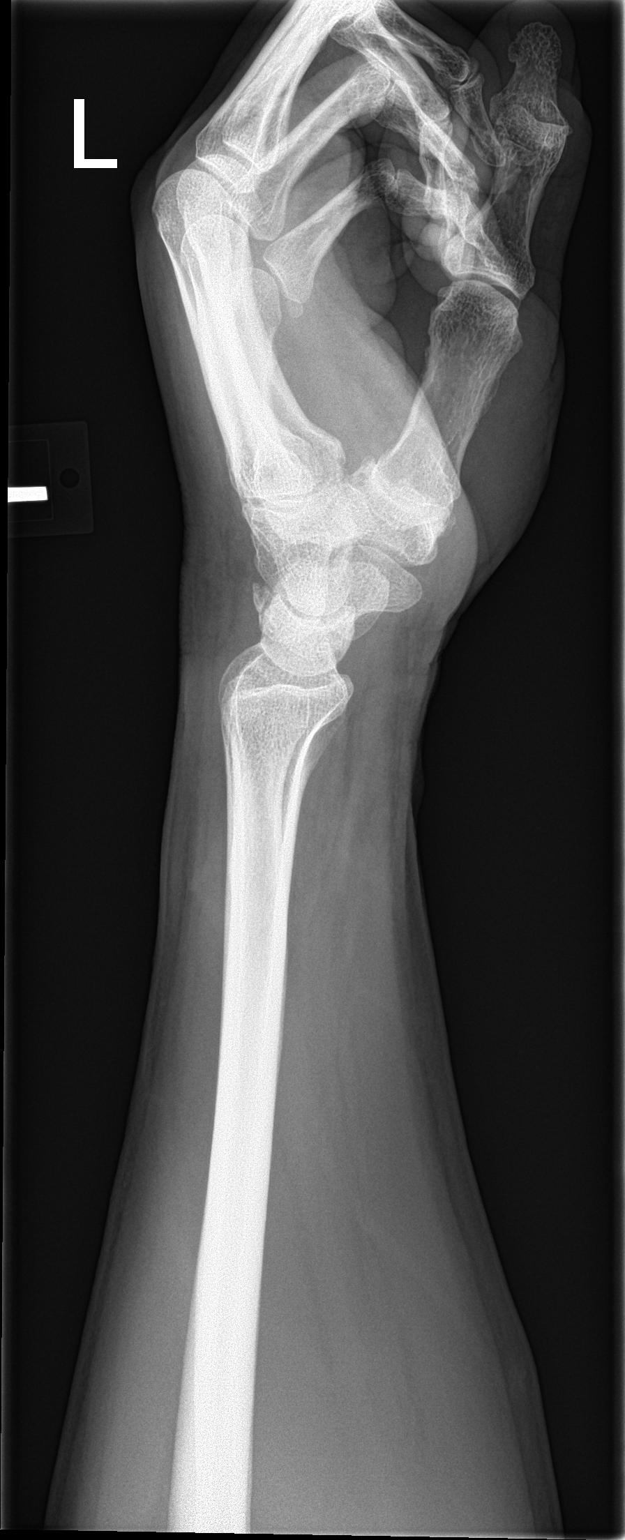

[wrist obl]
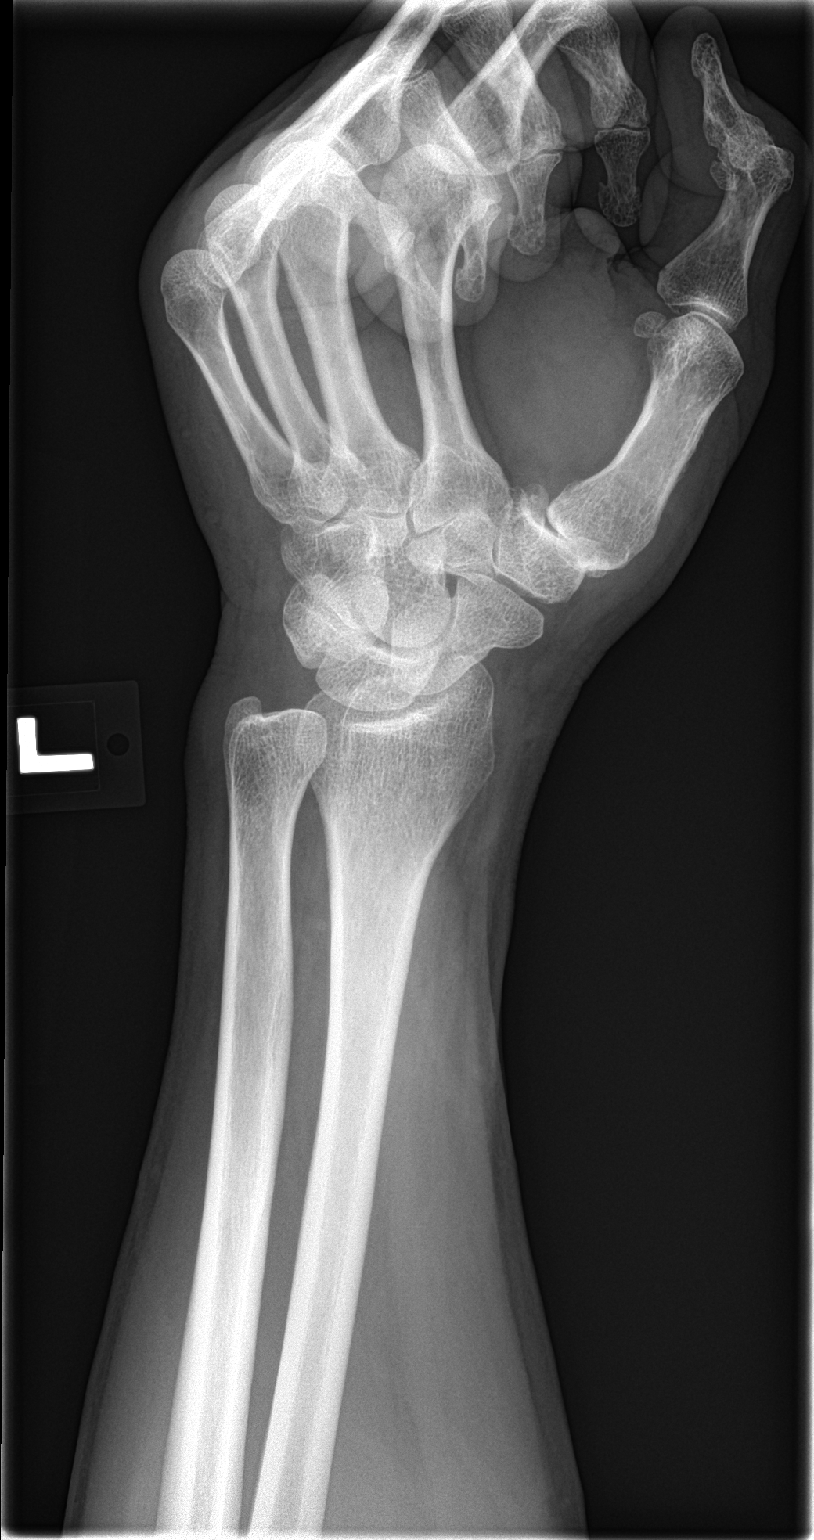

[3 of 3 positions shown; findings below may reference images not displayed]

FINDINGS: Frontal, oblique, and lateral views were obtained. There is no
fracture or dislocation. There is osteoarthritic change in the first
carpal-metacarpal joint. Other joint spaces appear normal. No
erosive change. Pronator quadratus fat pad is not elevated.
IMPRESSION: No evident fracture or dislocation. Osteoarthritic change in the
first carpal-metacarpal joint.

## 2019-12-22 ENCOUNTER — Ambulatory Visit (INDEPENDENT_AMBULATORY_CARE_PROVIDER_SITE_OTHER): Payer: BC Managed Care – PPO | Admitting: Nurse Practitioner

## 2019-12-22 ENCOUNTER — Encounter: Payer: Self-pay | Admitting: Nurse Practitioner

## 2019-12-22 ENCOUNTER — Other Ambulatory Visit: Payer: Self-pay

## 2019-12-22 VITALS — BP 121/75 | HR 63 | Temp 98.1°F | Resp 20 | Ht 70.0 in | Wt 223.0 lb

## 2019-12-22 DIAGNOSIS — I10 Essential (primary) hypertension: Secondary | ICD-10-CM | POA: Diagnosis not present

## 2019-12-22 DIAGNOSIS — R531 Weakness: Secondary | ICD-10-CM

## 2019-12-22 DIAGNOSIS — K219 Gastro-esophageal reflux disease without esophagitis: Secondary | ICD-10-CM

## 2019-12-22 DIAGNOSIS — N5201 Erectile dysfunction due to arterial insufficiency: Secondary | ICD-10-CM

## 2019-12-22 DIAGNOSIS — J4540 Moderate persistent asthma, uncomplicated: Secondary | ICD-10-CM | POA: Diagnosis not present

## 2019-12-22 DIAGNOSIS — E785 Hyperlipidemia, unspecified: Secondary | ICD-10-CM

## 2019-12-22 DIAGNOSIS — R202 Paresthesia of skin: Secondary | ICD-10-CM

## 2019-12-22 DIAGNOSIS — E1121 Type 2 diabetes mellitus with diabetic nephropathy: Secondary | ICD-10-CM | POA: Diagnosis not present

## 2019-12-22 DIAGNOSIS — M5090 Cervical disc disorder, unspecified, unspecified cervical region: Secondary | ICD-10-CM

## 2019-12-22 DIAGNOSIS — R200 Anesthesia of skin: Secondary | ICD-10-CM

## 2019-12-22 DIAGNOSIS — F321 Major depressive disorder, single episode, moderate: Secondary | ICD-10-CM

## 2019-12-22 LAB — BAYER DCA HB A1C WAIVED: HB A1C (BAYER DCA - WAIVED): 6.9 % (ref <7.0)

## 2019-12-22 MED ORDER — DIAZEPAM 5 MG PO TABS: ORAL_TABLET | ORAL | 2 refills | Status: DC

## 2019-12-22 MED ORDER — SILDENAFIL CITRATE 20 MG PO TABS: 20.0000 mg | ORAL_TABLET | Freq: Three times a day (TID) | ORAL | 1 refills | Status: DC

## 2019-12-22 NOTE — Addendum Note (Signed)
Addended by: [name] on: 12/22/2019 04:34 PM   Modules accepted: Orders

## 2019-12-22 NOTE — Progress Notes (Signed)
Subjective:    Patient ID: Benjamin Hale, male    DOB: 1966-03-03, 54 y.o.   MRN: [id number]   Chief Complaint: Medical Management of Chronic Issues    HPI:  1. Essential hypertension Patient reports checking his BP at home, reports a systolic of in the 74B regularly. Doe not limit sodium intake.  BP Readings from Last 3 Encounters:  12/22/19 121/75  06/23/19 133/82  11/10/18 124/73     2. Moderate persistent asthma without complication Reports no problems with asthma or shortness of breath. Does not remember the last time he required the se of albuterol.   3. Gastroesophageal reflux disease without esophagitis Does not avoid acidic/spicy/fatty foods. Does wait to lay down until at least 30 minutes after eating.   4. Controlled type 2 diabetes mellitus with diabetic nephropathy, without long-term current use of insulin (HCC) Does not check fasting CBGs daily, does every other day. Reports CBG under 150.Does not watch carb intake.   Lab Results  Component Value Date   HGBA1C 6.7 09/20/2019     5. Hyperlipidemia with target LDL less than 100 Does not avoid fatty foods and does exercise by walking but not regularly.  Lab Results  Component Value Date   CHOL 125 09/20/2019   HDL 20 (L) 09/20/2019   LDLCALC 61 09/20/2019   TRIG 272 (H) 09/20/2019   CHOLHDL 6.3 (H) 09/20/2019     6. Depression, major, single episode, moderate ([place]) Reports no problems with depression.     Outpatient Encounter Medications as of 12/22/2019  Medication Sig  . amLODipine (NORVASC) 10 MG tablet Take 1 tablet (10 mg total) by mouth daily.  Marland Kitchen aspirin EC 81 MG tablet Take 81 mg by mouth daily.  . blood glucose meter kit and supplies Dispense based on patient and insurance preference. Use up to four times daily as directed. (FOR ICD-10 E10.9, E11.9).  . diazepam (VALIUM) 5 MG tablet TAKE 1 TABLET EVERY 12 HOURS AS NEEDED FOR ANXIETY  . escitalopram (LEXAPRO) 10 MG tablet TAKE ONE (1)  TABLET EACH DAY  . fenofibrate (TRICOR) 145 MG tablet Take 1 tablet (145 mg total) by mouth daily.  . fluticasone (FLONASE) 50 MCG/ACT nasal spray Place 2 sprays into the nose daily as needed for rhinitis or allergies.  Marland Kitchen glimepiride (AMARYL) 4 MG tablet Take 1 tablet (4 mg total) by mouth daily before breakfast.  . glucose blood (ONETOUCH VERIO) test strip Test BS QID Dx E11.9  . ibuprofen (ADVIL,MOTRIN) 200 MG tablet Take 400-800 mg by mouth every 6 (six) hours as needed (For headache or pain.).  Marland Kitchen lisinopril (ZESTRIL) 40 MG tablet Take 1 tablet (40 mg total) by mouth daily.  . metFORMIN (GLUCOPHAGE) 1000 MG tablet Take 1 tablet (1,000 mg total) by mouth 2 (two) times daily with a meal.  . Multiple Vitamin (MULTIVITAMIN WITH MINERALS) TABS tablet Take 1 tablet by mouth daily.  . naproxen (NAPROSYN) 500 MG tablet Take 1 tablet (500 mg total) by mouth 2 (two) times daily with a meal.  . nitroGLYCERIN (NITROSTAT) 0.4 MG SL tablet Place 1 tablet (0.4 mg total) under the tongue every 5 (five) minutes as needed for chest pain.  Marland Kitchen omega-3 acid ethyl esters (LOVAZA) 1 g capsule Take 1 capsule (1 g total) by mouth 2 (two) times daily.  Marland Kitchen omeprazole (PRILOSEC) 20 MG capsule Take 1 capsule (20 mg total) by mouth daily as needed (for acid reflux). Acid reflux.  Glory Rosebush DELICA LANCETS 63A MISC EVERY  DAY  . Probiotic Product (PROBIOTIC PO) Take 1 capsule by mouth daily.  . VENTOLIN HFA 108 (90 Base) MCG/ACT inhaler USE 2 PUFFS 4 TIMES DAILY AS NEEDED  . metoprolol tartrate (LOPRESSOR) 25 MG tablet Take 1 tablet (25 mg total) by mouth 2 (two) times daily.   No facility-administered encounter medications on file as of 12/22/2019.    Past Surgical History:  Procedure Laterality Date  . ANTERIOR CERVICAL DECOMP/DISCECTOMY FUSION N/A 09/22/2012   Procedure: ANTERIOR CERVICAL DECOMPRESSION/DISCECTOMY FUSION 2 LEVELS;  Surgeon: Floyce Stakes, MD;  Location: [facility];  Service: Neurosurgery;  Laterality:  N/A;  Cervical five-six Cervical six-seven Anterior cervical decompression/diskectomy/fusion  . ATRIAL FLUTTER ABLATION  12/23/2012   CTI ablation by Dr Rayann Heman  . ATRIAL FLUTTER ABLATION N/A 12/23/2012   Procedure: ATRIAL FLUTTER ABLATION;  Surgeon: Thompson Grayer, MD;  Location: [facility] CATH LAB;  Service: Cardiovascular;  Laterality: N/A;  . CARDIAC CATHETERIZATION N/A 11/16/2015   Procedure: Left Heart Cath and Coronary Angiography;  Surgeon: Leonie Man, MD;  Location: [place] CV LAB;  Service: Cardiovascular;  Laterality: N/A;  . CARDIOVERSION N/A 09/23/2012   Procedure: CARDIOVERSION;  Surgeon: Renella Cunas, MD;  Location: [facility];  Service: Cardiovascular;  Laterality: N/A;  . TONSILLECTOMY  1973  . TREATMENT FISTULA ANAL  ~ 2007  . WISDOM TOOTH EXTRACTION      Family History  Problem Relation Age of Onset  . Lung cancer Mother        died @ 57  . Brain cancer Father        died @ 29  . Diabetes Maternal Uncle   . Diabetes Maternal Grandfather     New complaints: No new complaints.   Social history: Lives at home with wife and son. Works 5 days per week, approximately 9 hours per day. 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   Controlled substance contract: n/a     Review of Systems  Constitutional: Negative.   HENT: Negative.   Eyes: Negative.   Respiratory: Negative.   Cardiovascular: Negative.   Gastrointestinal: Negative.   Endocrine: Negative.   Genitourinary: Negative.   Musculoskeletal: Negative.   Skin: Negative.   Allergic/Immunologic: Negative.   Neurological: Negative.   Hematological: Negative.   Psychiatric/Behavioral: Negative.        Objective:   Physical Exam Constitutional:      Appearance: Normal appearance. He is normal weight.  HENT:     Head: Normocephalic and atraumatic.     Right Ear: Tympanic membrane, ear canal and external ear normal.     Left Ear: Tympanic membrane, ear canal and external ear normal.     Nose: Nose normal.      Mouth/Throat:     Mouth: Mucous membranes are moist.     Pharynx: Oropharynx is clear.  Eyes:     Extraocular Movements: Extraocular movements intact.     Conjunctiva/sclera: Conjunctivae normal.     Pupils: Pupils are equal, round, and reactive to light.  Cardiovascular:     Rate and Rhythm: Normal rate and regular rhythm.     Pulses: Normal pulses.     Heart sounds: Normal heart sounds.  Pulmonary:     Effort: Pulmonary effort is normal.     Breath sounds: Normal breath sounds.  Abdominal:     General: Abdomen is flat. Bowel sounds are normal.     Palpations: Abdomen is soft.  Genitourinary:    Penis: Normal.      Testes: Normal.  Prostate: Normal.     Rectum: Normal.  Musculoskeletal:        General: Normal range of motion.     Cervical back: Normal range of motion and neck supple.  Skin:    General: Skin is warm and dry.     Capillary Refill: Capillary refill takes less than 2 seconds.  Neurological:     General: No focal deficit present.     Mental Status: He is alert and oriented to person, place, and time. Mental status is at baseline.  Psychiatric:        Mood and Affect: Mood normal.        Behavior: Behavior normal.        Thought Content: Thought content normal.        Judgment: Judgment normal.    BP 121/75   Pulse 63   Temp 98.1 F (36.7 C) (Temporal)   Resp 20   Ht '5\' 10"'$  (1.778 m)   Wt 223 lb (101.2 kg)   SpO2 93%   BMI 32.00 kg/m   hgba1c 6.9%    Assessment & Plan:  TORRY ISTRE comes in today with chief complaint of Medical Management of Chronic Issues   Diagnosis and orders addressed:  1. Essential hypertension Encouraged to follow a sodium-restricted diet and to check BP regularly.   2. Moderate persistent asthma without complication Encouraged to maintain treatment regimen and keep rescue inhaler on hand.   3. Gastroesophageal reflux disease without esophagitis Patient encouraged to avoid spicy/acidic foods and instructed to  stay upright for at least 30 minutes after eating.   4. Controlled type 2 diabetes mellitus with diabetic nephropathy, without long-term current use of insulin ([place]) Patient encouraged to check fasting CBG daily and to comply with a carb-modified diet.   5. Hyperlipidemia with target LDL less than 100 Patient encouraged to follow a low-fat, carb modified diet. Encouraged to exercise as tolerated.   6. Depression, major, single episode, moderate ([place]) Patient encouraged to journal depression episodes and report thoughts of self-harm.    Labs pending Health Maintenance reviewed Diet and exercise encouraged  Follow up plan: Follow-up in 3 months    Mary-Margaret Hassell Done, [place], Roy Student

## 2019-12-22 NOTE — Patient Instructions (Signed)
Stress, Adult °Stress is a normal reaction to life events. Stress is what you feel when life demands more than you are used to, or more than you think you can handle. Some stress can be useful, such as studying for a test or meeting a deadline at work. Stress that occurs too often or for too long can cause problems. It can affect your emotional health and interfere with relationships and normal daily activities. Too much stress can weaken your body's defense system (immune system) and increase your risk for physical illness. If you already have a medical problem, stress can make it worse. °What are the causes? °All sorts of life events can cause stress. An event that causes stress for one person may not be stressful for another person. Major life events, whether positive or negative, commonly cause stress. Examples include: °· Losing a job or starting a new job. °· Losing a loved one. °· Moving to a new town or home. °· Getting married or divorced. °· Having a baby. °· Getting injured or sick. °Less obvious life events can also cause stress, especially if they occur day after day or in combination with each other. Examples include: °· Working long hours. °· Driving in traffic. °· Caring for children. °· Being in debt. °· Being in a difficult relationship. °What are the signs or symptoms? °Stress can cause emotional symptoms, including: °· Anxiety. This is feeling worried, afraid, on edge, overwhelmed, or out of control. °· Anger, including irritation or impatience. °· Depression. This is feeling sad, down, helpless, or guilty. °· Trouble focusing, remembering, or making decisions. °Stress can cause physical symptoms, including: °· Aches and pains. These may affect your head, neck, back, stomach, or other areas of your body. °· Tight muscles or a clenched jaw. °· Low energy. °· Trouble sleeping. °Stress can cause unhealthy behaviors, including: °· Eating to feel better (overeating) or skipping meals. °· Working too  much or putting off tasks. °· Smoking, drinking alcohol, or using drugs to feel better. °How is this diagnosed? °Stress is diagnosed through an assessment by your health care provider. He or she may diagnose this condition based on: °· Your symptoms and any stressful life events. °· Your medical history. °· Tests to rule out other causes of your symptoms. °Depending on your condition, your health care provider may refer you to a specialist for further evaluation. °How is this treated? ° °Stress management techniques are the recommended treatment for stress. Medicine is not typically recommended for the treatment of stress. °Techniques to reduce your reaction to stressful life events include: °· Stress identification. Monitor yourself for symptoms of stress and identify what causes stress for you. These skills may help you to avoid or prepare for stressful events. °· Time management. Set your priorities, keep a calendar of events, and learn to say no. Taking these actions can help you avoid making too many commitments. °Techniques for coping with stress include: °· Rethinking the problem. Try to think realistically about stressful events rather than ignoring them or overreacting. Try to find the positives in a stressful situation rather than focusing on the negatives. °· Exercise. Physical exercise can release both physical and emotional tension. The key is to find a form of exercise that you enjoy and do it regularly. °· Relaxation techniques. These relax the body and mind. The key is to find one or more that you enjoy and use the techniques regularly. Examples include: °? Meditation, deep breathing, or progressive relaxation techniques. °? Yoga or   tai chi. ? Biofeedback, mindfulness techniques, or journaling. ? Listening to music, being out in nature, or participating in other hobbies.  Practicing a healthy lifestyle. Eat a balanced diet, drink plenty of water, limit or avoid caffeine, and get plenty of  sleep.  Having a strong support network. Spend time with family, friends, or other people you enjoy being around. Express your feelings and talk things over with someone you trust. Counseling or talk therapy with a mental health professional may be helpful if you are having trouble managing stress on your own. Follow these instructions at home: Lifestyle   Avoid drugs.  Do not use any products that contain nicotine or tobacco, such as cigarettes, e-cigarettes, and chewing tobacco. If you need help quitting, ask your health care provider.  Limit alcohol intake to no more than 1 drink a day for nonpregnant women and 2 drinks a day for men. One drink equals 12 oz of beer, 5 oz of wine, or 1 oz of hard liquor  Do not use alcohol or drugs to relax.  Eat a balanced diet that includes fresh fruits and vegetables, whole grains, lean meats, fish, eggs, and beans, and low-fat dairy. Avoid processed foods and foods high in added fat, sugar, and salt.  Exercise at least 30 minutes on 5 or more days each week.  Get 7-8 hours of sleep each night. General instructions   Practice stress management techniques as discussed with your health care provider.  Drink enough fluid to keep your urine clear or pale yellow.  Take over-the-counter and prescription medicines only as told by your health care provider.  Keep all follow-up visits as told by your health care provider. This is important. Contact a health care provider if:  Your symptoms get worse.  You have new symptoms.  You feel overwhelmed by your problems and can no longer manage them on your own. Get help right away if:  You have thoughts of hurting yourself or others. If you ever feel like you may hurt yourself or others, or have thoughts about taking your own life, get help right away. You can go to your nearest emergency department or call:  Your local emergency services (911 in the [place]  A suicide crisis helpline, such as the  [facility] at 507-293-2218. This is open 24 hours a day. Summary  Stress is a normal reaction to life events. It can cause problems if it happens too often or for too long.  Practicing stress management techniques is the best way to treat stress.  Counseling or talk therapy with a mental health professional may be helpful if you are having trouble managing stress on your own. This information is not intended to replace advice given to you by your health care provider. Make sure you discuss any questions you have with your health care provider. Document Revised: 12/31/2018 Document Reviewed: 07/23/2016 Elsevier Patient Education  [place].

## 2019-12-22 NOTE — Addendum Note (Signed)
Addended by: Cleda Daub on: 12/22/2019 04:39 PM   Modules accepted: Orders

## 2019-12-23 LAB — LIPID PANEL
Chol/HDL Ratio: 6.1 ratio — ABNORMAL HIGH (ref 0.0–5.0)
Cholesterol, Total: 129 mg/dL (ref 100–199)
HDL: 21 mg/dL — ABNORMAL LOW (ref >39)
LDL Chol Calc (NIH): 65 mg/dL (ref 0–99)
Triglycerides: 266 mg/dL — ABNORMAL HIGH (ref 0–149)
VLDL Cholesterol Cal: 43 mg/dL — ABNORMAL HIGH (ref 5–40)

## 2019-12-23 LAB — CBC WITH DIFFERENTIAL/PLATELET
Basophils Absolute: 0.1 10*3/uL (ref 0.0–0.2)
Basos: 1 %
EOS (ABSOLUTE): 0.3 10*3/uL (ref 0.0–0.4)
Eos: 3 %
Hematocrit: 46.3 % (ref 37.5–51.0)
Hemoglobin: 15.8 g/dL (ref 13.0–17.7)
Immature Grans (Abs): 0 10*3/uL (ref 0.0–0.1)
Immature Granulocytes: 0 %
Lymphocytes Absolute: 2.4 10*3/uL (ref 0.7–3.1)
Lymphs: 25 %
MCH: 30.6 pg (ref 26.6–33.0)
MCHC: 34.1 g/dL (ref 31.5–35.7)
MCV: 90 fL (ref 79–97)
Monocytes Absolute: 0.9 10*3/uL (ref 0.1–0.9)
Monocytes: 9 %
Neutrophils Absolute: 5.9 10*3/uL (ref 1.4–7.0)
Neutrophils: 62 %
Platelets: 264 10*3/uL (ref 150–450)
RBC: 5.16 x10E6/uL (ref 4.14–5.80)
RDW: 13.1 % (ref 11.6–15.4)
WBC: 9.7 10*3/uL (ref 3.4–10.8)

## 2019-12-23 LAB — CMP14+EGFR
ALT: 66 IU/L — ABNORMAL HIGH (ref 0–44)
AST: 45 IU/L — ABNORMAL HIGH (ref 0–40)
Albumin/Globulin Ratio: 1.6 (ref 1.2–2.2)
Albumin: 4.4 g/dL (ref 3.8–4.9)
Alkaline Phosphatase: 58 IU/L (ref 48–121)
BUN/Creatinine Ratio: 18 (ref 9–20)
BUN: 15 mg/dL (ref 6–24)
Bilirubin Total: 0.6 mg/dL (ref 0.0–1.2)
CO2: 22 mmol/L (ref 20–29)
Calcium: 9.1 mg/dL (ref 8.7–10.2)
Chloride: 102 mmol/L (ref 96–106)
Creatinine, Ser: 0.83 mg/dL (ref 0.76–1.27)
GFR calc Af Amer: 116 mL/min/{1.73_m2} (ref >59)
GFR calc non Af Amer: 100 mL/min/{1.73_m2} (ref >59)
Globulin, Total: 2.7 g/dL (ref 1.5–4.5)
Glucose: 181 mg/dL — ABNORMAL HIGH (ref 65–99)
Potassium: 4.3 mmol/L (ref 3.5–5.2)
Sodium: 140 mmol/L (ref 134–144)
Total Protein: 7.1 g/dL (ref 6.0–8.5)

## 2020-01-16 ENCOUNTER — Other Ambulatory Visit: Payer: Self-pay | Admitting: Nurse Practitioner

## 2020-01-16 DIAGNOSIS — K219 Gastro-esophageal reflux disease without esophagitis: Secondary | ICD-10-CM

## 2020-01-16 DIAGNOSIS — I10 Essential (primary) hypertension: Secondary | ICD-10-CM

## 2020-01-16 DIAGNOSIS — E785 Hyperlipidemia, unspecified: Secondary | ICD-10-CM

## 2020-01-16 DIAGNOSIS — F321 Major depressive disorder, single episode, moderate: Secondary | ICD-10-CM

## 2020-01-16 DIAGNOSIS — E1121 Type 2 diabetes mellitus with diabetic nephropathy: Secondary | ICD-10-CM

## 2020-01-16 MED ORDER — ESCITALOPRAM OXALATE 10 MG PO TABS: ORAL_TABLET | ORAL | 1 refills | Status: DC

## 2020-01-16 MED ORDER — OMEPRAZOLE 20 MG PO CPDR: 20.0000 mg | DELAYED_RELEASE_CAPSULE | Freq: Every day | ORAL | 0 refills | Status: DC | PRN

## 2020-01-16 MED ORDER — LISINOPRIL 40 MG PO TABS: 40.0000 mg | ORAL_TABLET | Freq: Every day | ORAL | 0 refills | Status: DC

## 2020-01-16 MED ORDER — OMEGA-3-ACID ETHYL ESTERS 1 G PO CAPS: 1.0000 | ORAL_CAPSULE | Freq: Two times a day (BID) | ORAL | 0 refills | Status: DC

## 2020-01-16 MED ORDER — GLIMEPIRIDE 4 MG PO TABS: 4.0000 mg | ORAL_TABLET | Freq: Every day | ORAL | 0 refills | Status: DC

## 2020-01-16 MED ORDER — METOPROLOL TARTRATE 25 MG PO TABS
25.0000 mg | ORAL_TABLET | Freq: Two times a day (BID) | ORAL | 0 refills | Status: DC
Start: 2020-01-16 — End: 2020-03-19

## 2020-01-16 MED ORDER — FENOFIBRATE 145 MG PO TABS: 145.0000 mg | ORAL_TABLET | Freq: Every day | ORAL | 1 refills | Status: DC

## 2020-01-16 MED ORDER — METFORMIN HCL 1000 MG PO TABS: 1000.0000 mg | ORAL_TABLET | Freq: Two times a day (BID) | ORAL | 2 refills | Status: DC

## 2020-01-16 NOTE — Telephone Encounter (Signed)
Pt aware meds sent to [facility] new pharmacy

## 2020-01-16 NOTE — Telephone Encounter (Signed)
  Prescription Request  01/16/2020  What is the name of the medication or equipment? Needs all meds sent into [facility] for #90. Changed inusrance  Have you contacted your pharmacy to request a refill? (if applicable) NO   Which pharmacy would you like this sent to? [facility]   Patient notified that their request is being sent to the clinical staff for review and that they should receive a response within 2 business days.

## 2020-01-17 MED ORDER — AMLODIPINE BESYLATE 10 MG PO TABS: 10.0000 mg | ORAL_TABLET | Freq: Every day | ORAL | 0 refills | Status: DC

## 2020-01-17 NOTE — Addendum Note (Signed)
Addended by: Julious Payer D on: 01/17/2020 12:34 PM   Modules accepted: Orders

## 2020-01-27 ENCOUNTER — Telehealth: Payer: Self-pay | Admitting: *Deleted

## 2020-01-27 NOTE — Telephone Encounter (Signed)
PA in process for lovaza.   Approvedtoday CaseId:[id number];Status:Approved;Review Type:Prior Auth;Coverage Start Date:12/28/2019;Coverage End Date:01/26/2023;    Patient aware.

## 2020-02-29 ENCOUNTER — Ambulatory Visit: Payer: BC Managed Care – PPO

## 2020-03-04 IMAGING — DX DG ANKLE COMPLETE 3+V*L*
3 series · 3 of 3 positions shown · non-contrast
Comparison: None.

CLINICAL DATA: Left ankle pain.  No other history provided.

EXAM:
LEFT ANKLE COMPLETE - 3+ VIEW

[ankle ap]
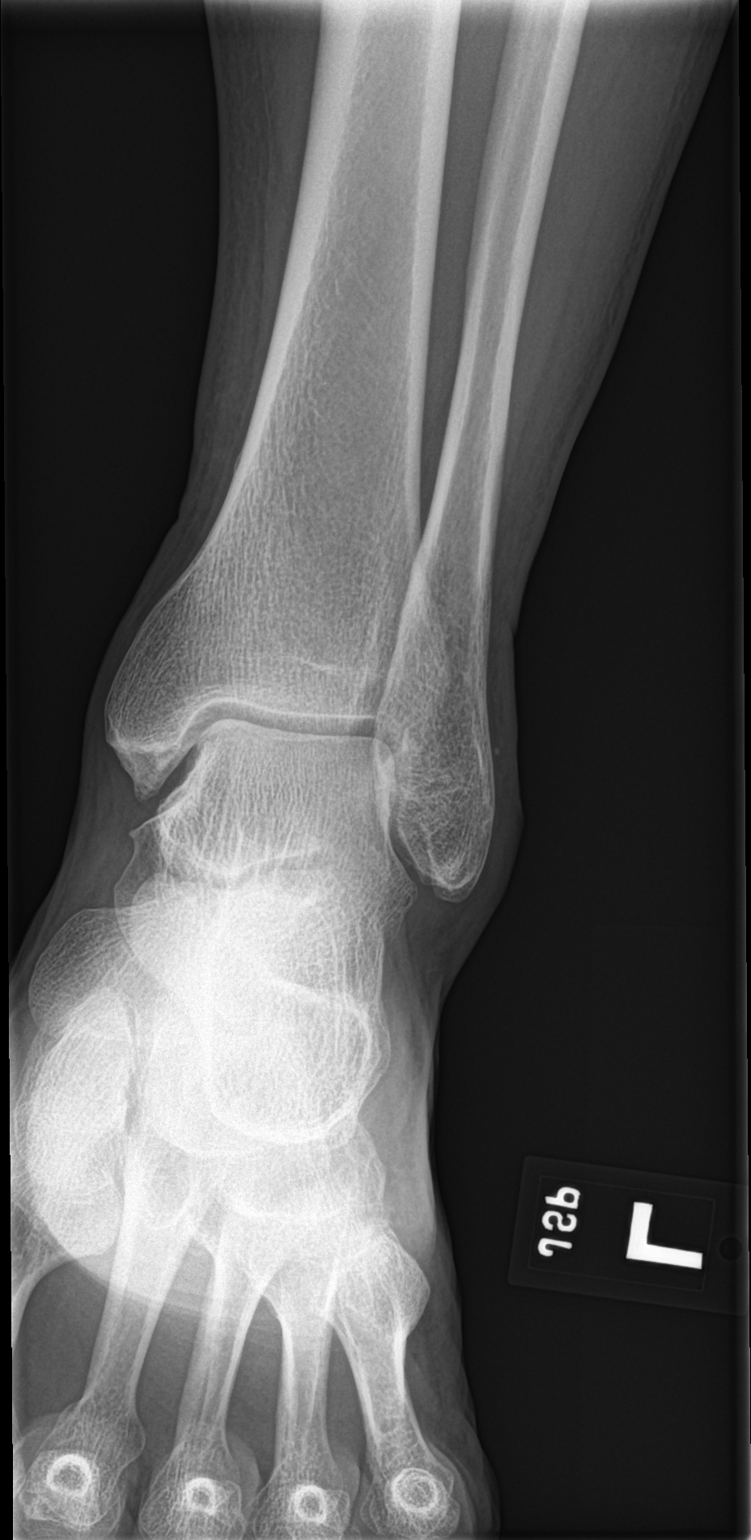

[ankle obl]
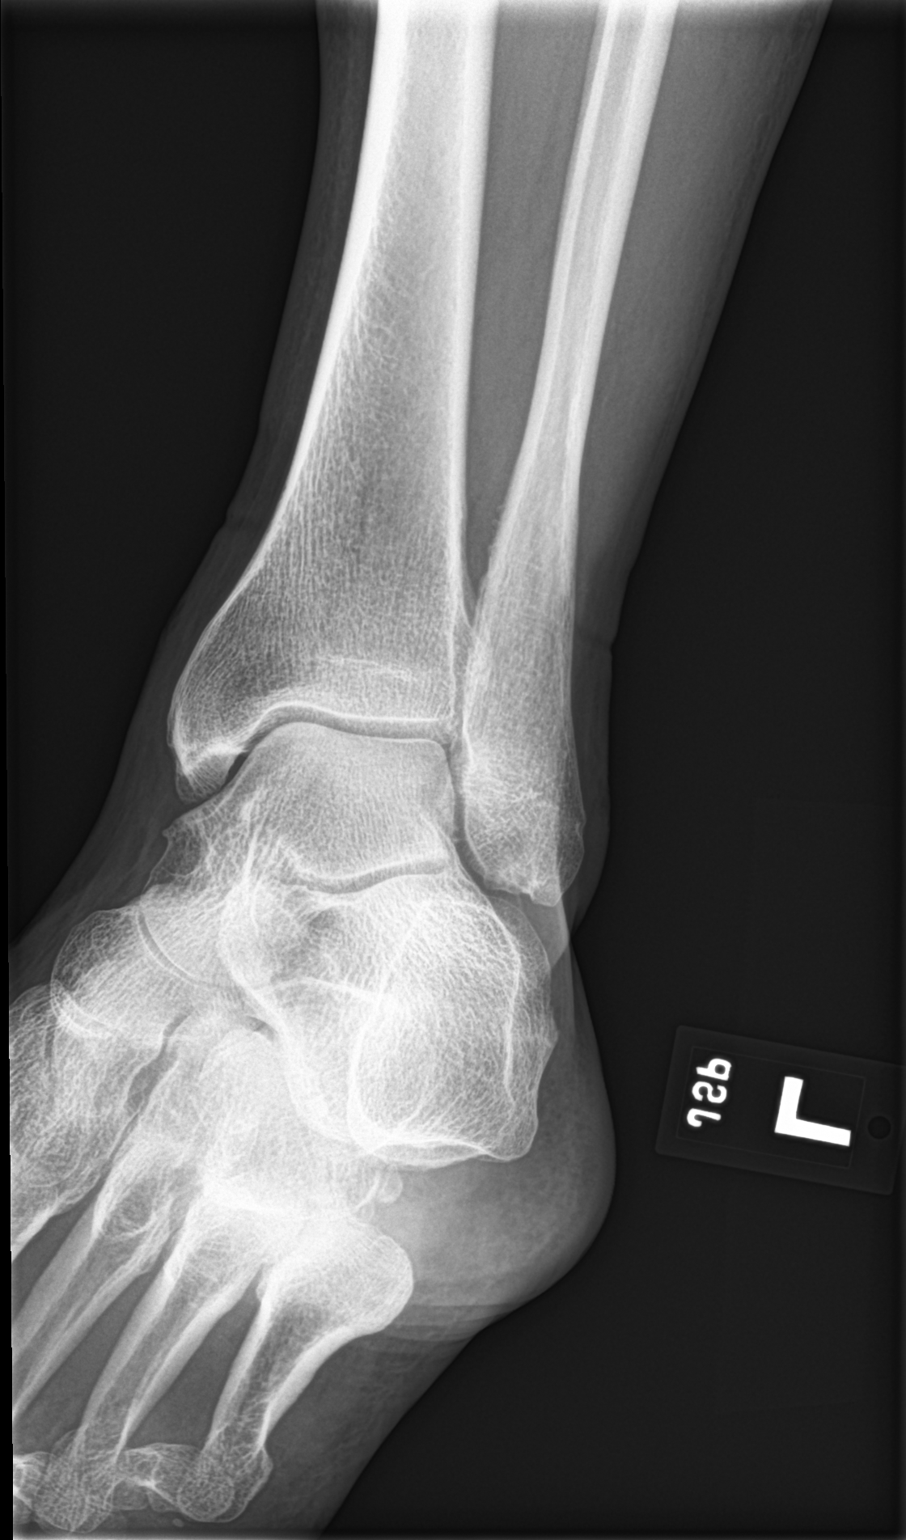

[ankle lat]
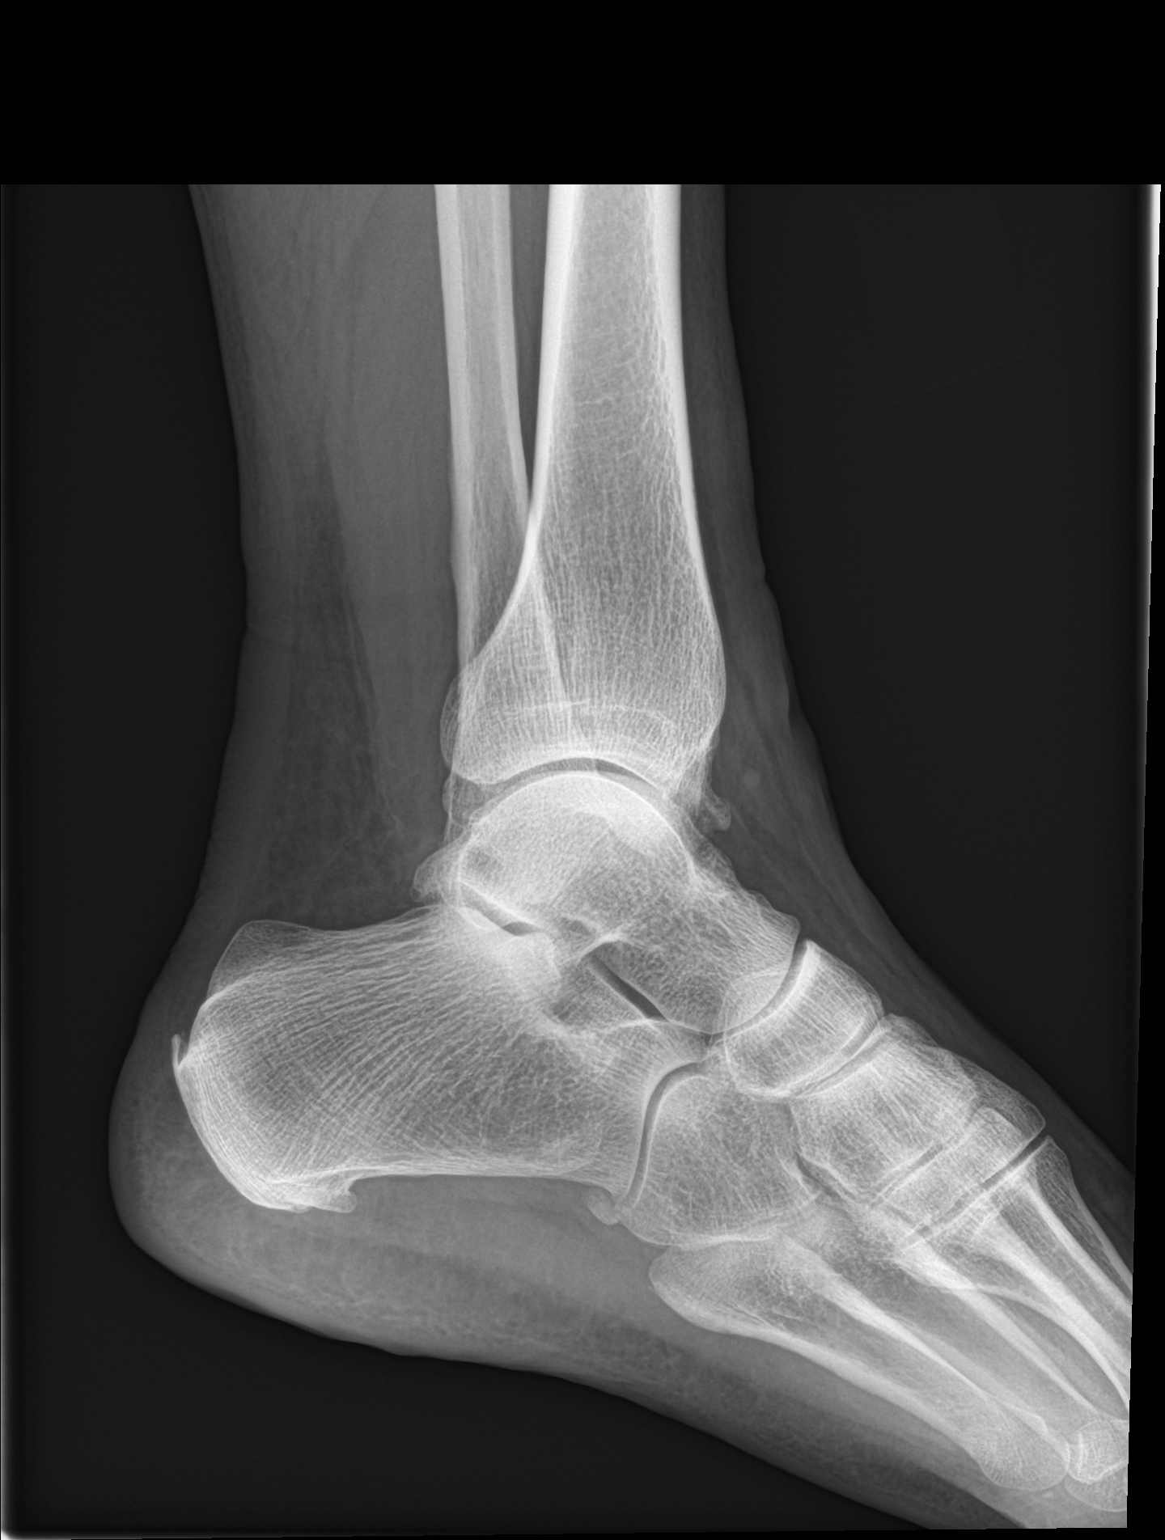

[3 of 3 positions shown; findings below may reference images not displayed]

FINDINGS: The ankle mortise is maintained. No acute ankle fracture is
identified. No osteochondral lesion. No definite joint effusion.
Mild degenerative changes. The mid and hindfoot bony structures are
intact. Small calcaneal spurs are noted.
IMPRESSION: No acute bony findings.  Mild degenerative changes.

## 2020-03-19 ENCOUNTER — Encounter: Payer: Self-pay | Admitting: Nurse Practitioner

## 2020-03-19 ENCOUNTER — Ambulatory Visit (INDEPENDENT_AMBULATORY_CARE_PROVIDER_SITE_OTHER): Payer: BC Managed Care – PPO | Admitting: Nurse Practitioner

## 2020-03-19 ENCOUNTER — Other Ambulatory Visit: Payer: Self-pay

## 2020-03-19 VITALS — BP 118/75 | HR 62 | Temp 97.6°F | Resp 20 | Ht 70.0 in | Wt 229.0 lb

## 2020-03-19 DIAGNOSIS — I10 Essential (primary) hypertension: Secondary | ICD-10-CM | POA: Diagnosis not present

## 2020-03-19 DIAGNOSIS — F321 Major depressive disorder, single episode, moderate: Secondary | ICD-10-CM

## 2020-03-19 DIAGNOSIS — Z1159 Encounter for screening for other viral diseases: Secondary | ICD-10-CM | POA: Diagnosis not present

## 2020-03-19 DIAGNOSIS — E785 Hyperlipidemia, unspecified: Secondary | ICD-10-CM

## 2020-03-19 DIAGNOSIS — E1121 Type 2 diabetes mellitus with diabetic nephropathy: Secondary | ICD-10-CM

## 2020-03-19 DIAGNOSIS — K219 Gastro-esophageal reflux disease without esophagitis: Secondary | ICD-10-CM

## 2020-03-19 LAB — BAYER DCA HB A1C WAIVED: HB A1C (BAYER DCA - WAIVED): 7.1 % — ABNORMAL HIGH (ref <7.0)

## 2020-03-19 MED ORDER — OMEPRAZOLE 20 MG PO CPDR: 20.0000 mg | DELAYED_RELEASE_CAPSULE | Freq: Every day | ORAL | 1 refills | Status: DC | PRN

## 2020-03-19 MED ORDER — LISINOPRIL 40 MG PO TABS: 40.0000 mg | ORAL_TABLET | Freq: Every day | ORAL | 1 refills | Status: DC

## 2020-03-19 MED ORDER — FENOFIBRATE 145 MG PO TABS: 145.0000 mg | ORAL_TABLET | Freq: Every day | ORAL | 1 refills | Status: DC

## 2020-03-19 MED ORDER — METFORMIN HCL 1000 MG PO TABS: 1000.0000 mg | ORAL_TABLET | Freq: Two times a day (BID) | ORAL | 1 refills | Status: DC

## 2020-03-19 MED ORDER — OMEGA-3-ACID ETHYL ESTERS 1 G PO CAPS: 1.0000 | ORAL_CAPSULE | Freq: Two times a day (BID) | ORAL | 1 refills | Status: DC

## 2020-03-19 MED ORDER — METOPROLOL TARTRATE 25 MG PO TABS: 25.0000 mg | ORAL_TABLET | Freq: Two times a day (BID) | ORAL | 1 refills | Status: DC

## 2020-03-19 MED ORDER — AMLODIPINE BESYLATE 10 MG PO TABS: 10.0000 mg | ORAL_TABLET | Freq: Every day | ORAL | 1 refills | Status: DC

## 2020-03-19 MED ORDER — GLIMEPIRIDE 4 MG PO TABS: 4.0000 mg | ORAL_TABLET | Freq: Every day | ORAL | 1 refills | Status: DC

## 2020-03-19 MED ORDER — ESCITALOPRAM OXALATE 10 MG PO TABS: ORAL_TABLET | ORAL | 1 refills | Status: DC

## 2020-03-19 NOTE — Progress Notes (Signed)
Subjective:    Patient ID: Benjamin Hale, male    DOB: 1966-05-04, 54 y.o.   MRN: [id number]   Chief Complaint: Medical Management of Chronic Issues    HPI:  1. Essential hypertension No c/o chest pain, sob or headache. Does not check blood pressure at home. BP Readings from Last 3 Encounters:  03/19/20 118/75  12/22/19 121/75  06/23/19 133/82     2. Controlled type 2 diabetes mellitus with diabetic nephropathy, without long-term current use of insulin (HCC) Fasting blood sugars are under 200. He denies any low blood sugar symptoms. Lab Results  Component Value Date   HGBA1C 6.9 12/22/2019     3. Hyperlipidemia with target LDL less than 100 Does try to watch diet. Does no dedicated exercise to speak of. Lab Results  Component Value Date   CHOL 129 12/22/2019   HDL 21 (L) 12/22/2019   LDLCALC 65 12/22/2019   TRIG 266 (H) 12/22/2019   CHOLHDL 6.1 (H) 12/22/2019     4. Depression, major, single episode, moderate ([place]) Is still on lexapro and is doing well.  Depression screen [facility] 2/9 03/19/2020 12/22/2019 09/22/2019  Decreased Interest 0 0 0  Down, Depressed, Hopeless 0 0 0  PHQ - 2 Score 0 0 0  Altered sleeping 0 - -  Tired, decreased energy 0 - -  Change in appetite 0 - -  Feeling bad or failure about yourself  0 - -  Trouble concentrating 0 - -  Moving slowly or fidgety/restless 0 - -  Suicidal thoughts 0 - -  PHQ-9 Score 0 - -  Difficult doing work/chores Not difficult at all - -  Some recent data might be hidden    5. Gastroesophageal reflux disease without esophagitis Is on omeprazole daily and is doing well.    Outpatient Encounter Medications as of 03/19/2020  Medication Sig  . amLODipine (NORVASC) 10 MG tablet Take 1 tablet (10 mg total) by mouth daily.  Marland Kitchen aspirin EC 81 MG tablet Take 81 mg by mouth daily.  . blood glucose meter kit and supplies Dispense based on patient and insurance preference. Use up to four times daily as directed. (FOR  ICD-10 E10.9, E11.9).  . diazepam (VALIUM) 5 MG tablet TAKE 1 TABLET EVERY 12 HOURS AS NEEDED FOR ANXIETY  . escitalopram (LEXAPRO) 10 MG tablet TAKE ONE (1) TABLET EACH DAY  . fenofibrate (TRICOR) 145 MG tablet Take 1 tablet (145 mg total) by mouth daily.  . fluticasone (FLONASE) 50 MCG/ACT nasal spray Place 2 sprays into the nose daily as needed for rhinitis or allergies.  Marland Kitchen glimepiride (AMARYL) 4 MG tablet Take 1 tablet (4 mg total) by mouth daily before breakfast.  . glucose blood (ONETOUCH VERIO) test strip Test BS QID Dx E11.9  . ibuprofen (ADVIL,MOTRIN) 200 MG tablet Take 400-800 mg by mouth every 6 (six) hours as needed (For headache or pain.).  Marland Kitchen lisinopril (ZESTRIL) 40 MG tablet Take 1 tablet (40 mg total) by mouth daily.  . metFORMIN (GLUCOPHAGE) 1000 MG tablet Take 1 tablet (1,000 mg total) by mouth 2 (two) times daily with a meal.  . metoprolol tartrate (LOPRESSOR) 25 MG tablet Take 1 tablet (25 mg total) by mouth 2 (two) times daily.  . Multiple Vitamin (MULTIVITAMIN WITH MINERALS) TABS tablet Take 1 tablet by mouth daily.  . naproxen (NAPROSYN) 500 MG tablet Take 1 tablet (500 mg total) by mouth 2 (two) times daily with a meal.  . nitroGLYCERIN (NITROSTAT) 0.4 MG SL  tablet Place 1 tablet (0.4 mg total) under the tongue every 5 (five) minutes as needed for chest pain.  Marland Kitchen omega-3 acid ethyl esters (LOVAZA) 1 g capsule Take 1 capsule (1 g total) by mouth 2 (two) times daily.  Marland Kitchen omeprazole (PRILOSEC) 20 MG capsule Take 1 capsule (20 mg total) by mouth daily as needed (for acid reflux). Acid reflux.  Glory Rosebush DELICA LANCETS 31V MISC EVERY DAY  . Probiotic Product (PROBIOTIC PO) Take 1 capsule by mouth daily.  . sildenafil (REVATIO) 20 MG tablet Take 1 tablet (20 mg total) by mouth 3 (three) times daily.  . VENTOLIN HFA 108 (90 Base) MCG/ACT inhaler USE 2 PUFFS 4 TIMES DAILY AS NEEDED     Past Surgical History:  Procedure Laterality Date  . ANTERIOR CERVICAL DECOMP/DISCECTOMY  FUSION N/A 09/22/2012   Procedure: ANTERIOR CERVICAL DECOMPRESSION/DISCECTOMY FUSION 2 LEVELS;  Surgeon: Floyce Stakes, MD;  Location: [facility];  Service: Neurosurgery;  Laterality: N/A;  Cervical five-six Cervical six-seven Anterior cervical decompression/diskectomy/fusion  . ATRIAL FLUTTER ABLATION  12/23/2012   CTI ablation by Dr Rayann Heman  . ATRIAL FLUTTER ABLATION N/A 12/23/2012   Procedure: ATRIAL FLUTTER ABLATION;  Surgeon: Thompson Grayer, MD;  Location: [facility] CATH [facility];  Service: Cardiovascular;  Laterality: N/A;  . CARDIAC CATHETERIZATION N/A 11/16/2015   Procedure: Left Heart Cath and Coronary Angiography;  Surgeon: Leonie Man, MD;  Location: [facility] CV LAB;  Service: Cardiovascular;  Laterality: N/A;  . CARDIOVERSION N/A 09/23/2012   Procedure: CARDIOVERSION;  Surgeon: Renella Cunas, MD;  Location: [place];  Service: Cardiovascular;  Laterality: N/A;  . TONSILLECTOMY  1973  . TREATMENT FISTULA ANAL  ~ 2007  . WISDOM TOOTH EXTRACTION      Family History  Problem Relation Age of Onset  . Lung cancer Mother        died @ 34  . Brain cancer Father        died @ 34  . Diabetes Maternal Uncle   . Diabetes Maternal Grandfather     New complaints: None otday  Social history: Lives with wife and son.   Controlled substance contract: n/a    Review of Systems  Constitutional: Negative for diaphoresis.  Eyes: Negative for pain.  Respiratory: Negative for shortness of breath.   Cardiovascular: Negative for chest pain, palpitations and leg swelling.  Gastrointestinal: Negative for abdominal pain.  Endocrine: Negative for polydipsia.  Skin: Negative for rash.  Neurological: Negative for dizziness, weakness and headaches.  Hematological: Does not bruise/bleed easily.  All other systems reviewed and are negative.      Objective:   Physical Exam Vitals and nursing note reviewed.  Constitutional:      Appearance: Normal appearance. He is well-developed.  HENT:     Head:  Normocephalic.     Nose: Nose normal.  Eyes:     Pupils: Pupils are equal, round, and reactive to light.  Neck:     Thyroid: No thyroid mass or thyromegaly.     Vascular: No carotid bruit or JVD.     Trachea: Phonation normal.  Cardiovascular:     Rate and Rhythm: Normal rate and regular rhythm.  Pulmonary:     Effort: Pulmonary effort is normal. No respiratory distress.     Breath sounds: Normal breath sounds.  Abdominal:     General: Bowel sounds are normal.     Palpations: Abdomen is soft.     Tenderness: There is no abdominal tenderness.  Musculoskeletal:  General: Normal range of motion.     Cervical back: Normal range of motion and neck supple.  Lymphadenopathy:     Cervical: No cervical adenopathy.  Skin:    General: Skin is warm and dry.  Neurological:     Mental Status: He is alert and oriented to person, place, and time.  Psychiatric:        Behavior: Behavior normal.        Thought Content: Thought content normal.        Judgment: Judgment normal.    BP 118/75   Pulse 62   Temp 97.6 F (36.4 C) (Temporal)   Resp 20   Ht $R'5\' 10"'mS$  (1.778 m)   Wt 229 lb (103.9 kg)   SpO2 95%   BMI 32.86 kg/m   hgba1c 7.1%     Assessment & Plan:  Benjamin Hale comes in today with chief complaint of Medical Management of Chronic Issues   Diagnosis and orders addressed:  1. Essential hypertension Low sodium diet - CBC with Differential/Platelet - CMP14+EGFR - amLODipine (NORVASC) 10 MG tablet; Take 1 tablet (10 mg total) by mouth daily.  Dispense: 90 tablet; Refill: 1 - lisinopril (ZESTRIL) 40 MG tablet; Take 1 tablet (40 mg total) by mouth daily.  Dispense: 90 tablet; Refill: 1 - metoprolol tartrate (LOPRESSOR) 25 MG tablet; Take 1 tablet (25 mg total) by mouth 2 (two) times daily.  Dispense: 180 tablet; Refill: 1  2. Controlled type 2 diabetes mellitus with diabetic nephropathy, without long-term current use of insulin (HCC) Continue to watch carbs in  diet - Bayer DCA Hb A1c Waived - Microalbumin / creatinine urine ratio - glimepiride (AMARYL) 4 MG tablet; Take 1 tablet (4 mg total) by mouth daily before breakfast.  Dispense: 90 tablet; Refill: 1 - metFORMIN (GLUCOPHAGE) 1000 MG tablet; Take 1 tablet (1,000 mg total) by mouth 2 (two) times daily with a meal.  Dispense: 180 tablet; Refill: 1  3. Hyperlipidemia with target LDL less than 100 Low fta diet - Lipid panel - omega-3 acid ethyl esters (LOVAZA) 1 g capsule; Take 1 capsule (1 g total) by mouth 2 (two) times daily.  Dispense: 180 capsule; Refill: 1 - fenofibrate (TRICOR) 145 MG tablet; Take 1 tablet (145 mg total) by mouth daily.  Dispense: 90 tablet; Refill: 1  4. Depression, major, single episode, moderate (HCC) Stress management - escitalopram (LEXAPRO) 10 MG tablet; TAKE ONE (1) TABLET EACH DAY  Dispense: 90 tablet; Refill: 1  5. Gastroesophageal reflux disease without esophagitis Avoid spicy foods Do not eat 2 hours prior to bedtime  - omeprazole (PRILOSEC) 20 MG capsule; Take 1 capsule (20 mg total) by mouth daily as needed (for acid reflux). Acid reflux.  Dispense: 90 capsule; Refill: 1   Labs pending Health Maintenance reviewed Diet and exercise encouraged  Follow up plan: 3 months   Mary-Margaret Hassell Done, [name]

## 2020-03-19 NOTE — Patient Instructions (Signed)
Carbohydrate Counting for Diabetes Mellitus, Adult  Carbohydrate counting is a method of keeping track of how many carbohydrates you eat. Eating carbohydrates naturally increases the amount of sugar (glucose) in the blood. Counting how many carbohydrates you eat helps keep your blood glucose within normal limits, which helps you manage your diabetes (diabetes mellitus). It is important to know how many carbohydrates you can safely have in each meal. This is different for every person. A diet and nutrition specialist (registered dietitian) can help you make a meal plan and calculate how many carbohydrates you should have at each meal and snack. Carbohydrates are found in the following foods:  Grains, such as breads and cereals.  Dried beans and soy products.  Starchy vegetables, such as potatoes, peas, and corn.  Fruit and fruit juices.  Milk and yogurt.  Sweets and snack foods, such as cake, cookies, candy, chips, and soft drinks. How do I count carbohydrates? There are two ways to count carbohydrates in food. You can use either of the methods or a combination of both. Reading "Nutrition Facts" on packaged food The "Nutrition Facts" list is included on the labels of almost all packaged foods and beverages in the [place] It includes:  The serving size.  Information about nutrients in each serving, including the grams (g) of carbohydrate per serving. To use the "Nutrition Facts":  Decide how many servings you will have.  Multiply the number of servings by the number of carbohydrates per serving.  The resulting number is the total amount of carbohydrates that you will be having. Learning standard serving sizes of other foods When you eat carbohydrate foods that are not packaged or do not include "Nutrition Facts" on the label, you need to measure the servings in order to count the amount of carbohydrates:  Measure the foods that you will eat with a food scale or measuring cup, if  needed.  Decide how many standard-size servings you will eat.  Multiply the number of servings by 15. Most carbohydrate-rich foods have about 15 g of carbohydrates per serving. ? For example, if you eat 8 oz (170 g) of strawberries, you will have eaten 2 servings and 30 g of carbohydrates (2 servings x 15 g = 30 g).  For foods that have more than one food mixed, such as soups and casseroles, you must count the carbohydrates in each food that is included. The following list contains standard serving sizes of common carbohydrate-rich foods. Each of these servings has about 15 g of carbohydrates:   hamburger bun or  English muffin.   oz (15 mL) syrup.   oz (14 g) jelly.  1 slice of bread.  1 six-inch tortilla.  3 oz (85 g) cooked rice or pasta.  4 oz (113 g) cooked dried beans.  4 oz (113 g) starchy vegetable, such as peas, corn, or potatoes.  4 oz (113 g) hot cereal.  4 oz (113 g) mashed potatoes or  of a large baked potato.  4 oz (113 g) canned or frozen fruit.  4 oz (120 mL) fruit juice.  4-6 crackers.  6 chicken nuggets.  6 oz (170 g) unsweetened dry cereal.  6 oz (170 g) plain fat-free yogurt or yogurt sweetened with artificial sweeteners.  8 oz (240 mL) milk.  8 oz (170 g) fresh fruit or one small piece of fruit.  24 oz (680 g) popped popcorn. Example of carbohydrate counting Sample meal  3 oz (85 g) chicken breast.  6 oz (170 g)   brown rice.  4 oz (113 g) corn.  8 oz (240 mL) milk.  8 oz (170 g) strawberries with sugar-free whipped topping. Carbohydrate calculation 1. Identify the foods that contain carbohydrates: ? Rice. ? Corn. ? Milk. ? Strawberries. 2. Calculate how many servings you have of each food: ? 2 servings rice. ? 1 serving corn. ? 1 serving milk. ? 1 serving strawberries. 3. Multiply each number of servings by 15 g: ? 2 servings rice x 15 g = 30 g. ? 1 serving corn x 15 g = 15 g. ? 1 serving milk x 15 g = 15 g. ? 1  serving strawberries x 15 g = 15 g. 4. Add together all of the amounts to find the total grams of carbohydrates eaten: ? 30 g + 15 g + 15 g + 15 g = 75 g of carbohydrates total. Summary  Carbohydrate counting is a method of keeping track of how many carbohydrates you eat.  Eating carbohydrates naturally increases the amount of sugar (glucose) in the blood.  Counting how many carbohydrates you eat helps keep your blood glucose within normal limits, which helps you manage your diabetes.  A diet and nutrition specialist (registered dietitian) can help you make a meal plan and calculate how many carbohydrates you should have at each meal and snack. This information is not intended to replace advice given to you by your health care provider. Make sure you discuss any questions you have with your health care provider. Document Revised: 12/25/2016 Document Reviewed: 11/14/2015 Elsevier Patient Education  2020 Elsevier Inc.  

## 2020-03-20 LAB — CMP14+EGFR
ALT: 62 IU/L — ABNORMAL HIGH (ref 0–44)
AST: 46 IU/L — ABNORMAL HIGH (ref 0–40)
Albumin/Globulin Ratio: 1.7 (ref 1.2–2.2)
Albumin: 4.5 g/dL (ref 3.8–4.9)
Alkaline Phosphatase: 64 IU/L (ref 44–121)
BUN/Creatinine Ratio: 17 (ref 9–20)
BUN: 14 mg/dL (ref 6–24)
Bilirubin Total: 0.6 mg/dL (ref 0.0–1.2)
CO2: 21 mmol/L (ref 20–29)
Calcium: 8.8 mg/dL (ref 8.7–10.2)
Chloride: 102 mmol/L (ref 96–106)
Creatinine, Ser: 0.84 mg/dL (ref 0.76–1.27)
GFR calc Af Amer: 116 mL/min/{1.73_m2} (ref >59)
GFR calc non Af Amer: 100 mL/min/{1.73_m2} (ref >59)
Globulin, Total: 2.6 g/dL (ref 1.5–4.5)
Glucose: 243 mg/dL — ABNORMAL HIGH (ref 65–99)
Potassium: 4.1 mmol/L (ref 3.5–5.2)
Sodium: 139 mmol/L (ref 134–144)
Total Protein: 7.1 g/dL (ref 6.0–8.5)

## 2020-03-20 LAB — CBC WITH DIFFERENTIAL/PLATELET
Basophils Absolute: 0.1 10*3/uL (ref 0.0–0.2)
Basos: 1 %
EOS (ABSOLUTE): 0.3 10*3/uL (ref 0.0–0.4)
Eos: 3 %
Hematocrit: 44.8 % (ref 37.5–51.0)
Hemoglobin: 15.8 g/dL (ref 13.0–17.7)
Immature Grans (Abs): 0.1 10*3/uL (ref 0.0–0.1)
Immature Granulocytes: 1 %
Lymphocytes Absolute: 2.4 10*3/uL (ref 0.7–3.1)
Lymphs: 29 %
MCH: 31.5 pg (ref 26.6–33.0)
MCHC: 35.3 g/dL (ref 31.5–35.7)
MCV: 89 fL (ref 79–97)
Monocytes Absolute: 0.8 10*3/uL (ref 0.1–0.9)
Monocytes: 9 %
Neutrophils Absolute: 4.8 10*3/uL (ref 1.4–7.0)
Neutrophils: 57 %
Platelets: 235 10*3/uL (ref 150–450)
RBC: 5.02 x10E6/uL (ref 4.14–5.80)
RDW: 13.1 % (ref 11.6–15.4)
WBC: 8.3 10*3/uL (ref 3.4–10.8)

## 2020-03-20 LAB — HEPATITIS C ANTIBODY: Hep C Virus Ab: <0.1 s/co ratio (ref 0.0–0.9)

## 2020-03-20 LAB — LIPID PANEL
Chol/HDL Ratio: 6.5 ratio — ABNORMAL HIGH (ref 0.0–5.0)
Cholesterol, Total: 130 mg/dL (ref 100–199)
HDL: 20 mg/dL — ABNORMAL LOW (ref >39)
LDL Chol Calc (NIH): 44 mg/dL (ref 0–99)
Triglycerides: 447 mg/dL — ABNORMAL HIGH (ref 0–149)
VLDL Cholesterol Cal: 66 mg/dL — ABNORMAL HIGH (ref 5–40)

## 2020-03-29 ENCOUNTER — Telehealth: Payer: Self-pay | Admitting: Nurse Practitioner

## 2020-03-29 DIAGNOSIS — Z20822 Contact with and (suspected) exposure to covid-19: Secondary | ICD-10-CM | POA: Diagnosis not present

## 2020-03-29 NOTE — Telephone Encounter (Signed)
Magic mouth wash rx will ot go electronically- has to be printed. Can you get someone to do this please.

## 2020-03-30 MED ORDER — MAGIC MOUTHWASH W/LIDOCAINE: 5.0000 mL | Freq: Three times a day (TID) | ORAL | 1 refills | Status: DC | PRN

## 2020-03-30 NOTE — Telephone Encounter (Signed)
Would you help mmm with this?

## 2020-03-30 NOTE — Telephone Encounter (Signed)
Printed the prescription for Magic mouthwash for the patient

## 2020-03-30 NOTE — Telephone Encounter (Signed)
Patient aware and verbalized understanding. °

## 2020-06-22 ENCOUNTER — Encounter: Payer: Self-pay | Admitting: Nurse Practitioner

## 2020-06-22 ENCOUNTER — Other Ambulatory Visit: Payer: Self-pay

## 2020-06-22 ENCOUNTER — Ambulatory Visit: Payer: BC Managed Care – PPO | Admitting: Nurse Practitioner

## 2020-06-22 VITALS — BP 120/73 | HR 63 | Temp 98.3°F | Resp 20 | Ht 70.0 in | Wt 223.0 lb

## 2020-06-22 DIAGNOSIS — I10 Essential (primary) hypertension: Secondary | ICD-10-CM

## 2020-06-22 DIAGNOSIS — F321 Major depressive disorder, single episode, moderate: Secondary | ICD-10-CM

## 2020-06-22 DIAGNOSIS — B370 Candidal stomatitis: Secondary | ICD-10-CM

## 2020-06-22 DIAGNOSIS — E785 Hyperlipidemia, unspecified: Secondary | ICD-10-CM | POA: Diagnosis not present

## 2020-06-22 DIAGNOSIS — K219 Gastro-esophageal reflux disease without esophagitis: Secondary | ICD-10-CM

## 2020-06-22 DIAGNOSIS — J4540 Moderate persistent asthma, uncomplicated: Secondary | ICD-10-CM

## 2020-06-22 DIAGNOSIS — E1121 Type 2 diabetes mellitus with diabetic nephropathy: Secondary | ICD-10-CM | POA: Diagnosis not present

## 2020-06-22 DIAGNOSIS — M5090 Cervical disc disorder, unspecified, unspecified cervical region: Secondary | ICD-10-CM

## 2020-06-22 LAB — BAYER DCA HB A1C WAIVED: HB A1C (BAYER DCA - WAIVED): 8.1 % — ABNORMAL HIGH (ref <7.0)

## 2020-06-22 MED ORDER — NYSTATIN 100000 UNIT/ML MT SUSP: 5.0000 mL | Freq: Four times a day (QID) | OROMUCOSAL | 2 refills | Status: DC

## 2020-06-22 MED ORDER — LISINOPRIL 40 MG PO TABS: 40.0000 mg | ORAL_TABLET | Freq: Every day | ORAL | 1 refills | Status: DC

## 2020-06-22 MED ORDER — AMLODIPINE BESYLATE 10 MG PO TABS: 10.0000 mg | ORAL_TABLET | Freq: Every day | ORAL | 1 refills | Status: DC

## 2020-06-22 MED ORDER — METFORMIN HCL 1000 MG PO TABS: 1000.0000 mg | ORAL_TABLET | Freq: Two times a day (BID) | ORAL | 1 refills | Status: DC

## 2020-06-22 MED ORDER — OMEPRAZOLE 20 MG PO CPDR: 20.0000 mg | DELAYED_RELEASE_CAPSULE | Freq: Every day | ORAL | 1 refills | Status: DC | PRN

## 2020-06-22 MED ORDER — FENOFIBRATE 145 MG PO TABS: 145.0000 mg | ORAL_TABLET | Freq: Every day | ORAL | 1 refills | Status: DC

## 2020-06-22 MED ORDER — METOPROLOL TARTRATE 25 MG PO TABS
25.0000 mg | ORAL_TABLET | Freq: Two times a day (BID) | ORAL | 1 refills | Status: DC
Start: 2020-06-22 — End: 2020-10-02

## 2020-06-22 MED ORDER — ESCITALOPRAM OXALATE 10 MG PO TABS
ORAL_TABLET | ORAL | 1 refills | Status: DC
Start: 2020-06-22 — End: 2020-10-02

## 2020-06-22 MED ORDER — DIAZEPAM 5 MG PO TABS: ORAL_TABLET | ORAL | 2 refills | Status: DC

## 2020-06-22 MED ORDER — GLIMEPIRIDE 4 MG PO TABS
4.0000 mg | ORAL_TABLET | Freq: Every day | ORAL | 1 refills | Status: DC
Start: 2020-06-22 — End: 2020-10-02

## 2020-06-22 NOTE — Progress Notes (Signed)
Subjective:    Patient ID: Benjamin Hale, male    DOB: 1966-01-30, 55 y.o.   MRN: [id number]   Chief Complaint: Medical Management of Chronic Issues    HPI:  1. Essential hypertension No c/o chest pain, sob or headache. Does not check blood pressure at home. BP Readings from Last 3 Encounters:  06/22/20 120/73  03/19/20 118/75  12/22/19 121/75     2. Controlled type 2 diabetes mellitus with diabetic nephropathy, without long-term current use of insulin (HCC) Fasting blood sugars are running over 200 when he does check it. He on ly checks it a couple of times a week. He denies nay symptoms of low blood sugar. Lab Results  Component Value Date   HGBA1C 7.1 (H) 03/19/2020     3. Hyperlipidemia with target LDL less than 100 Has not been watching diet and has not been doing any exercise. Lab Results  Component Value Date   CHOL 130 03/19/2020   HDL 20 (L) 03/19/2020   LDLCALC 44 03/19/2020   TRIG 447 (H) 03/19/2020   CHOLHDL 6.5 (H) 03/19/2020   The 10-year ASCVD risk score Mikey Bussing DC Jr., et al., 2013) is: 13.6%   Values used to calculate the score:     Age: 21 years     Sex: Male     Is Non-Hispanic African American: No     Diabetic: Yes     Tobacco smoker: No     Systolic Blood Pressure: 242 mmHg     Is BP treated: Yes     HDL Cholesterol: 20 mg/dL     Total Cholesterol: 130 mg/dL   4. Moderate persistent asthma without complication He is on no maintence inhalers. He denies having to use ventolin.  5. Gastroesophageal reflux disease without esophagitis Is on omperazole daily and that works well to keep symptoms under control.  6. Depression, major, single episode, moderate ([place]) Is on lexapro and is doing well. He denies any side effects Depression screen [facility] 2/9 06/22/2020 03/19/2020 12/22/2019  Decreased Interest 0 0 0  Down, Depressed, Hopeless 0 0 0  PHQ - 2 Score 0 0 0  Altered sleeping 0 0 -  Tired, decreased energy 0 0 -  Change in appetite 0 0 -   Feeling bad or failure about yourself  0 0 -  Trouble concentrating 0 0 -  Moving slowly or fidgety/restless 0 0 -  Suicidal thoughts 0 0 -  PHQ-9 Score 0 0 -  Difficult doing work/chores Not difficult at all Not difficult at all -  Some recent data might be hidden       Outpatient Encounter Medications as of 06/22/2020  Medication Sig  . amLODipine (NORVASC) 10 MG tablet Take 1 tablet (10 mg total) by mouth daily.  Marland Kitchen aspirin EC 81 MG tablet Take 81 mg by mouth daily.  . blood glucose meter kit and supplies Dispense based on patient and insurance preference. Use up to four times daily as directed. (FOR ICD-10 E10.9, E11.9).  . diazepam (VALIUM) 5 MG tablet TAKE 1 TABLET EVERY 12 HOURS AS NEEDED FOR ANXIETY  . escitalopram (LEXAPRO) 10 MG tablet TAKE ONE (1) TABLET EACH DAY  . fenofibrate (TRICOR) 145 MG tablet Take 1 tablet (145 mg total) by mouth daily.  . fluticasone (FLONASE) 50 MCG/ACT nasal spray Place 2 sprays into the nose daily as needed for rhinitis or allergies.  Marland Kitchen glimepiride (AMARYL) 4 MG tablet Take 1 tablet (4 mg total) by mouth daily  before breakfast.  . glucose blood (ONETOUCH VERIO) test strip Test BS QID Dx E11.9  . ibuprofen (ADVIL,MOTRIN) 200 MG tablet Take 400-800 mg by mouth every 6 (six) hours as needed (For headache or pain.).  Marland Kitchen lisinopril (ZESTRIL) 40 MG tablet Take 1 tablet (40 mg total) by mouth daily.  . magic mouthwash w/lidocaine SOLN Take 5 mLs by mouth 3 (three) times daily as needed for mouth pain. equal parts diphenhydramine and Maalox and nystatin and lidocaine patch  . metFORMIN (GLUCOPHAGE) 1000 MG tablet Take 1 tablet (1,000 mg total) by mouth 2 (two) times daily with a meal.  . Multiple Vitamin (MULTIVITAMIN WITH MINERALS) TABS tablet Take 1 tablet by mouth daily.  . naproxen (NAPROSYN) 500 MG tablet Take 1 tablet (500 mg total) by mouth 2 (two) times daily with a meal.  . nitroGLYCERIN (NITROSTAT) 0.4 MG SL tablet Place 1 tablet (0.4 mg total)  under the tongue every 5 (five) minutes as needed for chest pain.  Marland Kitchen omega-3 acid ethyl esters (LOVAZA) 1 g capsule Take 1 capsule (1 g total) by mouth 2 (two) times daily.  Marland Kitchen omeprazole (PRILOSEC) 20 MG capsule Take 1 capsule (20 mg total) by mouth daily as needed (for acid reflux). Acid reflux.  Glory Rosebush DELICA LANCETS 47Q MISC EVERY DAY  . Probiotic Product (PROBIOTIC PO) Take 1 capsule by mouth daily.  . sildenafil (REVATIO) 20 MG tablet Take 1 tablet (20 mg total) by mouth 3 (three) times daily.  . VENTOLIN HFA 108 (90 Base) MCG/ACT inhaler USE 2 PUFFS 4 TIMES DAILY AS NEEDED  . metoprolol tartrate (LOPRESSOR) 25 MG tablet Take 1 tablet (25 mg total) by mouth 2 (two) times daily.     Past Surgical History:  Procedure Laterality Date  . ANTERIOR CERVICAL DECOMP/DISCECTOMY FUSION N/A 09/22/2012   Procedure: ANTERIOR CERVICAL DECOMPRESSION/DISCECTOMY FUSION 2 LEVELS;  Surgeon: Floyce Stakes, MD;  Location: [facility];  Service: Neurosurgery;  Laterality: N/A;  Cervical five-six Cervical six-seven Anterior cervical decompression/diskectomy/fusion  . ATRIAL FLUTTER ABLATION  12/23/2012   CTI ablation by Dr Rayann Heman  . ATRIAL FLUTTER ABLATION N/A 12/23/2012   Procedure: ATRIAL FLUTTER ABLATION;  Surgeon: Thompson Grayer, MD;  Location: [facility] CATH LAB;  Service: Cardiovascular;  Laterality: N/A;  . CARDIAC CATHETERIZATION N/A 11/16/2015   Procedure: Left Heart Cath and Coronary Angiography;  Surgeon: Leonie Man, MD;  Location: [place] CV LAB;  Service: Cardiovascular;  Laterality: N/A;  . CARDIOVERSION N/A 09/23/2012   Procedure: CARDIOVERSION;  Surgeon: Renella Cunas, MD;  Location: [place];  Service: Cardiovascular;  Laterality: N/A;  . TONSILLECTOMY  1973  . TREATMENT FISTULA ANAL  ~ 2007  . WISDOM TOOTH EXTRACTION      Family History  Problem Relation Age of Onset  . Lung cancer Mother        died @ 34  . Brain cancer Father        died @ 60  . Diabetes Maternal Uncle   . Diabetes  Maternal Grandfather     New complaints: Has burning sensation along gum lines bil. Magic mouth wash helped in past  Social history: Lives with wife and son  Controlled substance contract: n/a     Review of Systems  Constitutional: Negative for diaphoresis.  Eyes: Negative for pain.  Respiratory: Negative for shortness of breath.   Cardiovascular: Negative for chest pain, palpitations and leg swelling.  Gastrointestinal: Negative for abdominal pain.  Endocrine: Negative for polydipsia.  Skin: Negative for  rash.  Neurological: Negative for dizziness, weakness and headaches.  Hematological: Does not bruise/bleed easily.  All other systems reviewed and are negative.      Objective:   Physical Exam Vitals and nursing note reviewed.  Constitutional:      Appearance: Normal appearance. He is well-developed and well-nourished.  HENT:     Head: Normocephalic.     Nose: Nose normal.     Mouth/Throat:     Mouth: Oropharynx is clear and moist.     Comments: White film bil buccal mucosa Eyes:     Extraocular Movements: EOM normal.     Pupils: Pupils are equal, round, and reactive to light.  Neck:     Thyroid: No thyroid mass or thyromegaly.     Vascular: No carotid bruit or JVD.     Trachea: Phonation normal.  Cardiovascular:     Rate and Rhythm: Normal rate and regular rhythm.  Pulmonary:     Effort: Pulmonary effort is normal. No respiratory distress.     Breath sounds: Normal breath sounds.  Abdominal:     General: Bowel sounds are normal. Aorta is normal.     Palpations: Abdomen is soft.     Tenderness: There is no abdominal tenderness.  Musculoskeletal:        General: Normal range of motion.     Cervical back: Normal range of motion and neck supple.  Lymphadenopathy:     Cervical: No cervical adenopathy.  Skin:    General: Skin is warm and dry.  Neurological:     Mental Status: He is alert and oriented to person, place, and time.  Psychiatric:        Mood  and Affect: Mood and affect normal.        Behavior: Behavior normal.        Thought Content: Thought content normal.        Judgment: Judgment normal.    BP 120/73   Pulse 63   Temp 98.3 F (36.8 C) (Temporal)   Resp 20   Ht $R'5\' 10"'Pb$  (1.778 m)   Wt 223 lb (101.2 kg)   SpO2 96%   BMI 32.00 kg/m   Hgba1c 8.1%        Assessment & Plan:  Benjamin Hale comes in today with chief complaint of Medical Management of Chronic Issues   Diagnosis and orders addressed:  1. Essential hypertension Low sodium diet - CBC with Differential/Platelet - CMP14+EGFR - lisinopril (ZESTRIL) 40 MG tablet; Take 1 tablet (40 mg total) by mouth daily.  Dispense: 90 tablet; Refill: 1 - metoprolol tartrate (LOPRESSOR) 25 MG tablet; Take 1 tablet (25 mg total) by mouth 2 (two) times daily.  Dispense: 180 tablet; Refill: 1 - amLODipine (NORVASC) 10 MG tablet; Take 1 tablet (10 mg total) by mouth daily.  Dispense: 90 tablet; Refill: 1  2. Controlled type 2 diabetes mellitus with diabetic nephropathy, without long-term current use of insulin (HCC) Stricter carb counting - Bayer DCA Hb A1c Waived - Microalbumin / creatinine urine ratio - glimepiride (AMARYL) 4 MG tablet; Take 1 tablet (4 mg total) by mouth daily before breakfast.  Dispense: 90 tablet; Refill: 1 - metFORMIN (GLUCOPHAGE) 1000 MG tablet; Take 1 tablet (1,000 mg total) by mouth 2 (two) times daily with a meal.  Dispense: 180 tablet; Refill: 1  3. Hyperlipidemia with target LDL less than 100 Low fat diet - Lipid panel - fenofibrate (TRICOR) 145 MG tablet; Take 1 tablet (145 mg total) by mouth daily.  Dispense: 90 tablet; Refill: 1  4. Moderate persistent asthma without complication  5. Gastroesophageal reflux disease without esophagitis Avoid spicy foods Do not eat 2 hours prior to bedtime - omeprazole (PRILOSEC) 20 MG capsule; Take 1 capsule (20 mg total) by mouth daily as needed (for acid reflux). Acid reflux.  Dispense: 90 capsule;  Refill: 1  6. Depression, major, single episode, moderate (HCC) Stress management - escitalopram (LEXAPRO) 10 MG tablet; TAKE ONE (1) TABLET EACH DAY  Dispense: 90 tablet; Refill: 1  7. Oral thrush - nystatin (MYCOSTATIN) 100000 UNIT/ML suspension; Take 5 mLs (500,000 Units total) by mouth 4 (four) times daily.  Dispense: 120 mL; Refill: 2  8. Cervical neck pain with evidence of disc disease - diazepam (VALIUM) 5 MG tablet; TAKE 1 TABLET EVERY 12 HOURS AS NEEDED FOR ANXIETY  Dispense: 30 tablet; Refill: 2   Labs pending Health Maintenance reviewed Diet and exercise encouraged  Follow up plan: 3 months   Mary-Margaret Hassell Done, [name]

## 2020-06-22 NOTE — Patient Instructions (Signed)
Oral Thrush, Adult  Oral thrush is an infection in your mouth and throat. It causes white patches on your tongue and in your mouth. Follow these instructions at home: Helping with soreness   To lessen your pain: ? Drink cold liquids, like water and iced tea. ? Eat frozen ice pops or frozen juices. ? Eat foods that are easy to swallow, like gelatin and ice cream. ? Drink from a straw if the patches in your mouth are painful. General instructions  Take or use over-the-counter and prescription medicines only as told by your doctor. Medicine for oral thrush may be something to swallow, or it may be something to put on the infected area.  Eat plain yogurt that has live cultures in it. Read the label to make sure.  If you wear dentures: ? Take out your dentures before you go to bed. ? Brush them well. ? Soak them in a denture cleaner.  Rinse your mouth with warm salt-water many times a day. To make the salt-water mixture, completely dissolve 1/2-1 teaspoon of salt in 1 cup of warm water. Contact a doctor if:  Your problems are getting worse.  Your problems do not get better in less than 7 days with treatment.  Your infection is spreading. This may show as white patches on the skin outside of your mouth.  You are nursing your baby and you have redness and pain in the nipples. This information is not intended to replace advice given to you by your health care provider. Make sure you discuss any questions you have with your health care provider. Document Revised: 09/04/2017 Document Reviewed: 02/25/2016 Elsevier Patient Education  2020 Elsevier Inc.  

## 2020-06-23 LAB — CMP14+EGFR
ALT: 65 IU/L — ABNORMAL HIGH (ref 0–44)
AST: 54 IU/L — ABNORMAL HIGH (ref 0–40)
Albumin/Globulin Ratio: 1.8 (ref 1.2–2.2)
Albumin: 4.4 g/dL (ref 3.8–4.9)
Alkaline Phosphatase: 52 IU/L (ref 44–121)
BUN/Creatinine Ratio: 16 (ref 9–20)
BUN: 14 mg/dL (ref 6–24)
Bilirubin Total: 0.6 mg/dL (ref 0.0–1.2)
CO2: 25 mmol/L (ref 20–29)
Calcium: 9.4 mg/dL (ref 8.7–10.2)
Chloride: 100 mmol/L (ref 96–106)
Creatinine, Ser: 0.9 mg/dL (ref 0.76–1.27)
GFR calc Af Amer: 112 mL/min/{1.73_m2} (ref >59)
GFR calc non Af Amer: 96 mL/min/{1.73_m2} (ref >59)
Globulin, Total: 2.4 g/dL (ref 1.5–4.5)
Glucose: 226 mg/dL — ABNORMAL HIGH (ref 65–99)
Potassium: 4.2 mmol/L (ref 3.5–5.2)
Sodium: 139 mmol/L (ref 134–144)
Total Protein: 6.8 g/dL (ref 6.0–8.5)

## 2020-06-23 LAB — CBC WITH DIFFERENTIAL/PLATELET
Basophils Absolute: 0.1 10*3/uL (ref 0.0–0.2)
Basos: 1 %
EOS (ABSOLUTE): 0.3 10*3/uL (ref 0.0–0.4)
Eos: 4 %
Hematocrit: 46.9 % (ref 37.5–51.0)
Hemoglobin: 15.9 g/dL (ref 13.0–17.7)
Immature Grans (Abs): 0 10*3/uL (ref 0.0–0.1)
Immature Granulocytes: 0 %
Lymphocytes Absolute: 2.3 10*3/uL (ref 0.7–3.1)
Lymphs: 28 %
MCH: 30.1 pg (ref 26.6–33.0)
MCHC: 33.9 g/dL (ref 31.5–35.7)
MCV: 89 fL (ref 79–97)
Monocytes Absolute: 0.8 10*3/uL (ref 0.1–0.9)
Monocytes: 10 %
Neutrophils Absolute: 4.8 10*3/uL (ref 1.4–7.0)
Neutrophils: 57 %
Platelets: 279 10*3/uL (ref 150–450)
RBC: 5.29 x10E6/uL (ref 4.14–5.80)
RDW: 13.1 % (ref 11.6–15.4)
WBC: 8.3 10*3/uL (ref 3.4–10.8)

## 2020-06-23 LAB — LIPID PANEL
Chol/HDL Ratio: 6.4 ratio — ABNORMAL HIGH (ref 0.0–5.0)
Cholesterol, Total: 121 mg/dL (ref 100–199)
HDL: 19 mg/dL — ABNORMAL LOW (ref >39)
LDL Chol Calc (NIH): 57 mg/dL (ref 0–99)
Triglycerides: 284 mg/dL — ABNORMAL HIGH (ref 0–149)
VLDL Cholesterol Cal: 45 mg/dL — ABNORMAL HIGH (ref 5–40)

## 2020-07-10 ENCOUNTER — Ambulatory Visit (INDEPENDENT_AMBULATORY_CARE_PROVIDER_SITE_OTHER): Payer: BC Managed Care – PPO | Admitting: Family

## 2020-07-10 ENCOUNTER — Encounter: Payer: Self-pay | Admitting: Family

## 2020-07-10 ENCOUNTER — Telehealth: Payer: Self-pay

## 2020-07-10 DIAGNOSIS — Z20822 Contact with and (suspected) exposure to covid-19: Secondary | ICD-10-CM

## 2020-07-10 DIAGNOSIS — R059 Cough, unspecified: Secondary | ICD-10-CM | POA: Diagnosis not present

## 2020-07-10 LAB — VERITOR FLU A/B WAIVED
Influenza A: NEGATIVE
Influenza B: NEGATIVE

## 2020-07-10 MED ORDER — BENZONATATE 200 MG PO CAPS: 200.0000 mg | ORAL_CAPSULE | Freq: Three times a day (TID) | ORAL | 1 refills | Status: DC | PRN

## 2020-07-10 MED ORDER — ALBUTEROL SULFATE HFA 108 (90 BASE) MCG/ACT IN AERS: 2.0000 | INHALATION_SPRAY | Freq: Four times a day (QID) | RESPIRATORY_TRACT | 2 refills | Status: DC | PRN

## 2020-07-10 NOTE — Telephone Encounter (Signed)
Called Pt to F/U on BPA for Fever of 100.7.  Pt saw PCP today and was given inhaler and cough medicine. Pt was tested for COVID and flu today.  Pt taking medicine for fever and stated it was time for more now. Fever has not gone above 100.7. Pt will continue to monitor fever and see medical help if needed.   Also Discussed SOB, and chest heaviness - and need for medical help if needed.   Pt agrees with plan.

## 2020-07-10 NOTE — Progress Notes (Signed)
Virtual Visit via telephone Note Due to COVID-19 pandemic this visit was conducted virtually. This visit type was conducted due to national recommendations for restrictions regarding the COVID-19 Pandemic (e.g. social distancing, sheltering in place) in an effort to limit this patient's exposure and mitigate transmission in our community. All issues noted in this document were discussed and addressed.  A physical exam was not performed with this format.  I connected with Benjamin Hale on 07/10/20 at 1:03 pm  by telephone and verified that I am speaking with the correct person using two identifiers. Benjamin Hale is currently located at home and his wife is currently with him  during visit. The provider, Jannifer Rodney, [name] is located in their office at time of visit.  I discussed the limitations, risks, security and privacy concerns of performing an evaluation and management service by telephone and the availability of in person appointments. I also discussed with the patient that there may be a patient responsible charge related to this service. The patient expressed understanding and agreed to proceed.   History and Present Illness:  PT calls with COVID like symptoms that started on 07/08/20. He took a COVID test at home that was negative.  Cough This is a new problem. The current episode started in the past 7 days (Sunday). The problem has been gradually worsening. The problem occurs every few minutes. The cough is non-productive. Associated symptoms include chills, a fever, headaches, myalgias, nasal congestion, postnasal drip, rhinorrhea, a sore throat and shortness of breath. Pertinent negatives include no ear congestion, ear pain or wheezing. Associated symptoms comments: fatigue. The symptoms are aggravated by lying down. Risk factors for lung disease include smoking/tobacco exposure. He has tried rest for the symptoms. The treatment provided mild relief.      Review of Systems   Constitutional: Positive for chills and fever.  HENT: Positive for postnasal drip, rhinorrhea and sore throat. Negative for ear pain.   Respiratory: Positive for cough and shortness of breath. Negative for wheezing.   Musculoskeletal: Positive for myalgias.  Neurological: Positive for headaches.     Observations/Objective: No SOB or distress noted, hoarse voice   Assessment and Plan: 1. Encounter by telehealth for suspected COVID-19 COVID test pending, rest, force fluids, tylenol as needed, Quarantine until results, work note given, report any worsening symptoms such as increased shortness of breath, swelling, or continued high fevers.  - MyChart COVID-19 home monitoring program; Future - albuterol (VENTOLIN HFA) 108 (90 Base) MCG/ACT inhaler; Inhale 2 puffs into the lungs every 6 (six) hours as needed for wheezing or shortness of breath.  Dispense: 8 g; Refill: 2 - benzonatate (TESSALON) 200 MG capsule; Take 1 capsule (200 mg total) by mouth 3 (three) times daily as needed.  Dispense: 30 capsule; Refill: 1 - Veritor Flu A/B Waived - Novel Coronavirus, NAA ([facility])     I discussed the assessment and treatment plan with the patient. The patient was provided an opportunity to ask questions and all were answered. The patient agreed with the plan and demonstrated an understanding of the instructions.   The patient was advised to call back or seek an in-person evaluation if the symptoms worsen or if the condition fails to improve as anticipated.  The above assessment and management plan was discussed with the patient. The patient verbalized understanding of and has agreed to the management plan. Patient is aware to call the clinic if symptoms persist or worsen. Patient is aware when to return to the clinic  for a follow-up visit. Patient educated on when it is appropriate to go to the emergency department.   Time call ended:  1:14 pm   I provided 11 minutes of non-face-to-face time during  this encounter.    Jannifer Rodney, [name]

## 2020-07-11 LAB — SARS-COV-2, NAA 2 DAY TAT

## 2020-07-11 LAB — NOVEL CORONAVIRUS, NAA: SARS-CoV-2, NAA: NOT DETECTED

## 2020-09-13 ENCOUNTER — Ambulatory Visit: Payer: Self-pay | Admitting: Nurse Practitioner

## 2020-09-21 ENCOUNTER — Ambulatory Visit: Payer: Self-pay | Admitting: Nurse Practitioner

## 2020-10-01 ENCOUNTER — Other Ambulatory Visit: Payer: Self-pay | Admitting: Nurse Practitioner

## 2020-10-01 DIAGNOSIS — E785 Hyperlipidemia, unspecified: Secondary | ICD-10-CM

## 2020-10-02 ENCOUNTER — Ambulatory Visit: Payer: BC Managed Care – PPO | Admitting: Nurse Practitioner

## 2020-10-02 ENCOUNTER — Other Ambulatory Visit: Payer: Self-pay

## 2020-10-02 ENCOUNTER — Encounter: Payer: Self-pay | Admitting: Nurse Practitioner

## 2020-10-02 VITALS — BP 133/72 | HR 58 | Temp 98.2°F | Resp 20 | Ht 70.0 in | Wt 223.0 lb

## 2020-10-02 DIAGNOSIS — J4540 Moderate persistent asthma, uncomplicated: Secondary | ICD-10-CM | POA: Diagnosis not present

## 2020-10-02 DIAGNOSIS — I10 Essential (primary) hypertension: Secondary | ICD-10-CM | POA: Diagnosis not present

## 2020-10-02 DIAGNOSIS — E1121 Type 2 diabetes mellitus with diabetic nephropathy: Secondary | ICD-10-CM | POA: Diagnosis not present

## 2020-10-02 DIAGNOSIS — E785 Hyperlipidemia, unspecified: Secondary | ICD-10-CM

## 2020-10-02 DIAGNOSIS — F321 Major depressive disorder, single episode, moderate: Secondary | ICD-10-CM

## 2020-10-02 DIAGNOSIS — K219 Gastro-esophageal reflux disease without esophagitis: Secondary | ICD-10-CM

## 2020-10-02 LAB — BAYER DCA HB A1C WAIVED: HB A1C (BAYER DCA - WAIVED): 7.7 % — ABNORMAL HIGH (ref <7.0)

## 2020-10-02 MED ORDER — METOPROLOL TARTRATE 25 MG PO TABS: 25.0000 mg | ORAL_TABLET | Freq: Two times a day (BID) | ORAL | 1 refills | Status: DC

## 2020-10-02 MED ORDER — FENOFIBRATE 145 MG PO TABS: 145.0000 mg | ORAL_TABLET | Freq: Every day | ORAL | 1 refills | Status: DC

## 2020-10-02 MED ORDER — OMEPRAZOLE 20 MG PO CPDR: 20.0000 mg | DELAYED_RELEASE_CAPSULE | Freq: Every day | ORAL | 1 refills | Status: DC | PRN

## 2020-10-02 MED ORDER — OMEGA-3-ACID ETHYL ESTERS 1 G PO CAPS: 1.0000 | ORAL_CAPSULE | Freq: Two times a day (BID) | ORAL | 1 refills | Status: DC

## 2020-10-02 MED ORDER — METFORMIN HCL 1000 MG PO TABS
1000.0000 mg | ORAL_TABLET | Freq: Two times a day (BID) | ORAL | 1 refills | Status: DC
Start: 2020-10-02 — End: 2020-10-02

## 2020-10-02 MED ORDER — ESCITALOPRAM OXALATE 10 MG PO TABS: ORAL_TABLET | ORAL | 1 refills | Status: DC

## 2020-10-02 MED ORDER — METFORMIN HCL 1000 MG PO TABS: 1000.0000 mg | ORAL_TABLET | Freq: Two times a day (BID) | ORAL | 1 refills | Status: DC

## 2020-10-02 MED ORDER — GLIMEPIRIDE 4 MG PO TABS: 4.0000 mg | ORAL_TABLET | Freq: Every day | ORAL | 1 refills | Status: DC

## 2020-10-02 MED ORDER — AMLODIPINE BESYLATE 10 MG PO TABS: 10.0000 mg | ORAL_TABLET | Freq: Every day | ORAL | 1 refills | Status: DC

## 2020-10-02 MED ORDER — LISINOPRIL 40 MG PO TABS: 40.0000 mg | ORAL_TABLET | Freq: Every day | ORAL | 1 refills | Status: DC

## 2020-10-02 MED ORDER — LISINOPRIL 40 MG PO TABS
40.0000 mg | ORAL_TABLET | Freq: Every day | ORAL | 1 refills | Status: DC
Start: 2020-10-02 — End: 2021-01-01

## 2020-10-02 MED ORDER — METOPROLOL TARTRATE 25 MG PO TABS
25.0000 mg | ORAL_TABLET | Freq: Two times a day (BID) | ORAL | 1 refills | Status: DC
Start: 2020-10-02 — End: 2021-01-01

## 2020-10-02 NOTE — Progress Notes (Signed)
 Subjective:    Patient ID: Benjamin Hale, male    DOB: 11/22/1965, 54 y.o.   MRN: [id number]   Chief Complaint: Medical Management of Chronic Issues    HPI:  1. Essential hypertension No c/o chest pain, sob or headache. Does not check blood pressure at home. BP Readings from Last 3 Encounters:  06/22/20 120/73  03/19/20 118/75  12/22/19 121/75     2. Hyperlipidemia with target LDL less than 100 Does not watch diet and does no exercise. Is currently on lovaza and tricor. Lab Results  Component Value Date   CHOL 121 06/22/2020   HDL 19 (L) 06/22/2020   LDLCALC 57 06/22/2020   TRIG 284 (H) 06/22/2020   CHOLHDL 6.4 (H) 06/22/2020    3. Controlled type 2 diabetes mellitus with diabetic nephropathy, without long-term current use of insulin (HCC) Blood sugars at home have been running high, around 220-225 at least Lab Results  Component Value Date   HGBA1C 8.1 (H) 06/22/2020     4. Moderate persistent asthma without complication He is not on an inhaler other then albuterol and he has  Not need that as of late.  5. Gastroesophageal reflux disease without esophagitis Is on omeprazole daily and that seems to work well for his symptoms.  6. Depression, major, single episode, moderate (HCC) Is on daily lexapro daily and that works well for him. Depression screen PHQ 2/9 10/02/2020 06/22/2020 03/19/2020  Decreased Interest 0 0 0  Down, Depressed, Hopeless 0 0 0  PHQ - 2 Score 0 0 0  Altered sleeping 1 0 0  Tired, decreased energy 1 0 0  Change in appetite 0 0 0  Feeling bad or failure about yourself  0 0 0  Trouble concentrating 0 0 0  Moving slowly or fidgety/restless 0 0 0  Suicidal thoughts 0 0 0  PHQ-9 Score 2 0 0  Difficult doing work/chores Not difficult at all Not difficult at all Not difficult at all  Some recent data might be hidden     7. Erectile dysfunction. Has sildenafil but his wife will not let him take it beacause she is afraid something will  happen to him if he takes it.  Outpatient Encounter Medications as of 10/02/2020  Medication Sig  . albuterol (VENTOLIN HFA) 108 (90 Base) MCG/ACT inhaler Inhale 2 puffs into the lungs every 6 (six) hours as needed for wheezing or shortness of breath.  . amLODipine (NORVASC) 10 MG tablet Take 1 tablet (10 mg total) by mouth daily.  . aspirin EC 81 MG tablet Take 81 mg by mouth daily.  . benzonatate (TESSALON) 200 MG capsule Take 1 capsule (200 mg total) by mouth 3 (three) times daily as needed.  . blood glucose meter kit and supplies Dispense based on patient and insurance preference. Use up to four times daily as directed. (FOR ICD-10 E10.9, E11.9).  . diazepam (VALIUM) 5 MG tablet TAKE 1 TABLET EVERY 12 HOURS AS NEEDED FOR ANXIETY  . escitalopram (LEXAPRO) 10 MG tablet TAKE ONE (1) TABLET EACH DAY  . fenofibrate (TRICOR) 145 MG tablet Take 1 tablet (145 mg total) by mouth daily.  . fluticasone (FLONASE) 50 MCG/ACT nasal spray Place 2 sprays into the nose daily as needed for rhinitis or allergies.  . glimepiride (AMARYL) 4 MG tablet Take 1 tablet (4 mg total) by mouth daily before breakfast.  . glucose blood (ONETOUCH VERIO) test strip Test BS QID Dx E11.9  . ibuprofen (ADVIL,MOTRIN) 200 MG tablet Take   400-800 mg by mouth every 6 (six) hours as needed (For headache or pain.).  Marland Kitchen lisinopril (ZESTRIL) 40 MG tablet Take 1 tablet (40 mg total) by mouth daily.  . magic mouthwash w/lidocaine SOLN Take 5 mLs by mouth 3 (three) times daily as needed for mouth pain. equal parts diphenhydramine and Maalox and nystatin and lidocaine patch  . metFORMIN (GLUCOPHAGE) 1000 MG tablet Take 1 tablet (1,000 mg total) by mouth 2 (two) times daily with a meal.  . metoprolol tartrate (LOPRESSOR) 25 MG tablet Take 1 tablet (25 mg total) by mouth 2 (two) times daily.  . Multiple Vitamin (MULTIVITAMIN WITH MINERALS) TABS tablet Take 1 tablet by mouth daily.  . naproxen (NAPROSYN) 500 MG tablet Take 1 tablet (500 mg  total) by mouth 2 (two) times daily with a meal.  . nitroGLYCERIN (NITROSTAT) 0.4 MG SL tablet Place 1 tablet (0.4 mg total) under the tongue every 5 (five) minutes as needed for chest pain.  Marland Kitchen nystatin (MYCOSTATIN) 100000 UNIT/ML suspension Take 5 mLs (500,000 Units total) by mouth 4 (four) times daily.  Marland Kitchen omega-3 acid ethyl esters (LOVAZA) 1 g capsule TAKE 1 CAPSULE TWICE A DAY  . omeprazole (PRILOSEC) 20 MG capsule Take 1 capsule (20 mg total) by mouth daily as needed (for acid reflux). Acid reflux.  Glory Rosebush DELICA LANCETS 98P MISC EVERY DAY  . Probiotic Product (PROBIOTIC PO) Take 1 capsule by mouth daily.  . sildenafil (REVATIO) 20 MG tablet Take 1 tablet (20 mg total) by mouth 3 (three) times daily.     Past Surgical History:  Procedure Laterality Date  . ANTERIOR CERVICAL DECOMP/DISCECTOMY FUSION N/A 09/22/2012   Procedure: ANTERIOR CERVICAL DECOMPRESSION/DISCECTOMY FUSION 2 LEVELS;  Surgeon: Floyce Stakes, MD;  Location: [facility];  Service: Neurosurgery;  Laterality: N/A;  Cervical five-six Cervical six-seven Anterior cervical decompression/diskectomy/fusion  . ATRIAL FLUTTER ABLATION  12/23/2012   CTI ablation by Dr Rayann Heman  . ATRIAL FLUTTER ABLATION N/A 12/23/2012   Procedure: ATRIAL FLUTTER ABLATION;  Surgeon: Thompson Grayer, MD;  Location: [facility] CATH LAB;  Service: Cardiovascular;  Laterality: N/A;  . CARDIAC CATHETERIZATION N/A 11/16/2015   Procedure: Left Heart Cath and Coronary Angiography;  Surgeon: Leonie Man, MD;  Location: [place] CV LAB;  Service: Cardiovascular;  Laterality: N/A;  . CARDIOVERSION N/A 09/23/2012   Procedure: CARDIOVERSION;  Surgeon: Renella Cunas, MD;  Location: [place];  Service: Cardiovascular;  Laterality: N/A;  . TONSILLECTOMY  1973  . TREATMENT FISTULA ANAL  ~ 2007  . WISDOM TOOTH EXTRACTION      Family History  Problem Relation Age of Onset  . Lung cancer Mother        died @ 37  . Brain cancer Father        died @ 26  . Diabetes  Maternal Uncle   . Diabetes Maternal Grandfather     New complaints: None today  Social history: Lives with wife and son.  Controlled substance contract: 10/02/20    Review of Systems  Constitutional: Negative for diaphoresis.  Eyes: Negative for pain.  Respiratory: Negative for shortness of breath.   Cardiovascular: Negative for chest pain, palpitations and leg swelling.  Gastrointestinal: Negative for abdominal pain.  Endocrine: Negative for polydipsia.  Skin: Negative for rash.  Neurological: Negative for dizziness, weakness and headaches.  Hematological: Does not bruise/bleed easily.  All other systems reviewed and are negative.      Objective:   Physical Exam Vitals and nursing note reviewed.  Constitutional:      Appearance: Normal appearance. He is well-developed.  HENT:     Head: Normocephalic.     Nose: Nose normal.  Eyes:     Pupils: Pupils are equal, round, and reactive to light.  Neck:     Thyroid: No thyroid mass or thyromegaly.     Vascular: No carotid bruit or JVD.     Trachea: Phonation normal.  Cardiovascular:     Rate and Rhythm: Normal rate and regular rhythm.  Pulmonary:     Effort: Pulmonary effort is normal. No respiratory distress.     Breath sounds: Normal breath sounds.  Abdominal:     General: Bowel sounds are normal.     Palpations: Abdomen is soft.     Tenderness: There is no abdominal tenderness.  Musculoskeletal:        General: Normal range of motion.     Cervical back: Normal range of motion and neck supple.  Lymphadenopathy:     Cervical: No cervical adenopathy.  Skin:    General: Skin is warm and dry.  Neurological:     Mental Status: He is alert and oriented to person, place, and time.  Psychiatric:        Behavior: Behavior normal.        Thought Content: Thought content normal.        Judgment: Judgment normal.    BP 133/72   Pulse (!) 58   Temp 98.2 F (36.8 C) (Temporal)   Resp 20   Ht 5' 10" (1.778 m)   Wt  223 lb (101.2 kg)   SpO2 97%   BMI 32.00 kg/m   HGBA1c 7.7%       Assessment & Plan:  Benjamin Hale comes in today with chief complaint of Medical Management of Chronic Issues   Diagnosis and orders addressed:  1. Essential hypertension Low sodium diet - CBC with Differential/Platelet - CMP14+EGFR - metoprolol tartrate (LOPRESSOR) 25 MG tablet; Take 1 tablet (25 mg total) by mouth 2 (two) times daily.  Dispense: 180 tablet; Refill: 1 - lisinopril (ZESTRIL) 40 MG tablet; Take 1 tablet (40 mg total) by mouth daily.  Dispense: 90 tablet; Refill: 1 - amLODipine (NORVASC) 10 MG tablet; Take 1 tablet (10 mg total) by mouth daily.  Dispense: 90 tablet; Refill: 1  2. Hyperlipidemia with target LDL less than 100 Low fta diet - Lipid panel - fenofibrate (TRICOR) 145 MG tablet; Take 1 tablet (145 mg total) by mouth daily.  Dispense: 90 tablet; Refill: 1 - omega-3 acid ethyl esters (LOVAZA) 1 g capsule; Take 1 capsule (1 g total) by mouth 2 (two) times daily.  Dispense: 180 capsule; Refill: 1  3. Controlled type 2 diabetes mellitus with diabetic nephropathy, without long-term current use of insulin (HCC) Stricter carb counting - Bayer DCA Hb A1c Waived - Microalbumin / creatinine urine ratio - glimepiride (AMARYL) 4 MG tablet; Take 1 tablet (4 mg total) by mouth daily before breakfast.  Dispense: 90 tablet; Refill: 1 - metFORMIN (GLUCOPHAGE) 1000 MG tablet; Take 1 tablet (1,000 mg total) by mouth 2 (two) times daily with a meal.  Dispense: 180 tablet; Refill: 1  4. Moderate persistent asthma without complication Albuterol only as needed  5. Gastroesophageal reflux disease without esophagitis Avoid spicy foods Do not eat 2 hours prior to bedtime  - omeprazole (PRILOSEC) 20 MG capsule; Take 1 capsule (20 mg total) by mouth daily as needed (for acid reflux). Acid reflux.  Dispense: 90 capsule; Refill:  1  6. Depression, major, single episode, moderate (HCC) Stress management -  escitalopram (LEXAPRO) 10 MG tablet; TAKE ONE (1) TABLET EACH DAY  Dispense: 90 tablet; Refill: 1   Labs pending Health Maintenance reviewed Diet and exercise encouraged  Follow up plan:  3 months  Mary-Margaret [name], [name]  

## 2020-10-03 LAB — CBC WITH DIFFERENTIAL/PLATELET
Basophils Absolute: 0.1 10*3/uL (ref 0.0–0.2)
Basos: 1 %
EOS (ABSOLUTE): 0.3 10*3/uL (ref 0.0–0.4)
Eos: 3 %
Hematocrit: 45.4 % (ref 37.5–51.0)
Hemoglobin: 15.5 g/dL (ref 13.0–17.7)
Immature Grans (Abs): 0 10*3/uL (ref 0.0–0.1)
Immature Granulocytes: 1 %
Lymphocytes Absolute: 2.2 10*3/uL (ref 0.7–3.1)
Lymphs: 27 %
MCH: 29.8 pg (ref 26.6–33.0)
MCHC: 34.1 g/dL (ref 31.5–35.7)
MCV: 87 fL (ref 79–97)
Monocytes Absolute: 0.7 10*3/uL (ref 0.1–0.9)
Monocytes: 9 %
Neutrophils Absolute: 5 10*3/uL (ref 1.4–7.0)
Neutrophils: 59 %
Platelets: 291 10*3/uL (ref 150–450)
RBC: 5.21 x10E6/uL (ref 4.14–5.80)
RDW: 12.6 % (ref 11.6–15.4)
WBC: 8.4 10*3/uL (ref 3.4–10.8)

## 2020-10-03 LAB — CMP14+EGFR
ALT: 52 IU/L — ABNORMAL HIGH (ref 0–44)
AST: 44 IU/L — ABNORMAL HIGH (ref 0–40)
Albumin/Globulin Ratio: 1.7 (ref 1.2–2.2)
Albumin: 4.4 g/dL (ref 3.8–4.9)
Alkaline Phosphatase: 62 IU/L (ref 44–121)
BUN/Creatinine Ratio: 13 (ref 9–20)
BUN: 13 mg/dL (ref 6–24)
Bilirubin Total: 0.7 mg/dL (ref 0.0–1.2)
CO2: 24 mmol/L (ref 20–29)
Calcium: 9.6 mg/dL (ref 8.7–10.2)
Chloride: 99 mmol/L (ref 96–106)
Creatinine, Ser: 1 mg/dL (ref 0.76–1.27)
Globulin, Total: 2.6 g/dL (ref 1.5–4.5)
Glucose: 213 mg/dL — ABNORMAL HIGH (ref 65–99)
Potassium: 5.1 mmol/L (ref 3.5–5.2)
Sodium: 136 mmol/L (ref 134–144)
Total Protein: 7 g/dL (ref 6.0–8.5)
eGFR: 89 mL/min/{1.73_m2} (ref >59)

## 2020-10-03 LAB — LIPID PANEL
Chol/HDL Ratio: 7.2 ratio — ABNORMAL HIGH (ref 0.0–5.0)
Cholesterol, Total: 123 mg/dL (ref 100–199)
HDL: 17 mg/dL — ABNORMAL LOW (ref >39)
LDL Chol Calc (NIH): 49 mg/dL (ref 0–99)
Triglycerides: 377 mg/dL — ABNORMAL HIGH (ref 0–149)
VLDL Cholesterol Cal: 57 mg/dL — ABNORMAL HIGH (ref 5–40)

## 2020-10-03 LAB — MICROALBUMIN / CREATININE URINE RATIO
Creatinine, Urine: 171.9 mg/dL
Microalb/Creat Ratio: 7 mg/g creat (ref 0–29)
Microalbumin, Urine: 11.5 ug/mL

## 2020-10-04 ENCOUNTER — Encounter: Payer: Self-pay | Admitting: Nurse Practitioner

## 2020-11-16 ENCOUNTER — Other Ambulatory Visit (HOSPITAL_COMMUNITY): Payer: Self-pay

## 2020-11-16 ENCOUNTER — Ambulatory Visit: Payer: BC Managed Care – PPO | Admitting: Nurse Practitioner

## 2020-11-16 ENCOUNTER — Encounter: Payer: Self-pay | Admitting: Nurse Practitioner

## 2020-11-16 DIAGNOSIS — U071 COVID-19: Secondary | ICD-10-CM

## 2020-11-16 MED ORDER — NIRMATRELVIR/RITONAVIR (PAXLOVID)TABLET
3.0000 | ORAL_TABLET | Freq: Two times a day (BID) | ORAL | 0 refills | Status: AC
  Filled 2020-11-16: qty 30, 5d supply, fill #0

## 2020-11-16 MED ORDER — NAPROXEN 500 MG PO TABS: 500.0000 mg | ORAL_TABLET | Freq: Two times a day (BID) | ORAL | 0 refills | Status: DC

## 2020-11-16 MED ORDER — HYDROCODONE BIT-HOMATROP MBR 5-1.5 MG/5ML PO SOLN: 5.0000 mL | Freq: Four times a day (QID) | ORAL | 0 refills | Status: DC | PRN

## 2020-11-16 NOTE — Progress Notes (Signed)
Virtual Visit  Note Due to COVID-19 pandemic this visit was conducted virtually. This visit type was conducted due to national recommendations for restrictions regarding the COVID-19 Pandemic (e.g. social distancing, sheltering in place) in an effort to limit this patient's exposure and mitigate transmission in our community. All issues noted in this document were discussed and addressed.  A physical exam was not performed with this format.  I connected with Benjamin Hale on 11/16/20 at 1:50 by telephone and verified that I am speaking with the correct person using two identifiers. Benjamin Hale is currently located at home and no one  is currently with him during visit. The provider, Mary-Margaret Daphine Deutscher, [name] is located in their office at time of visit.  I discussed the limitations, risks, security and privacy concerns of performing an evaluation and management service by telephone and the availability of in person appointments. I also discussed with the patient that there may be a patient responsible charge related to this service. The patient expressed understanding and agreed to proceed.   History and Present Illness:   Chief Complaint: Covid Exposure   HPI Patient said he developed body aches, 101 fever. Cough and congestion. He did a home covid test and it came back positive. This is his second time having covid.   Review of Systems  Constitutional: Positive for chills and fever.  HENT: Positive for congestion and sore throat.   Respiratory: Positive for cough and sputum production. Negative for shortness of breath.   Musculoskeletal: Positive for myalgias.  Neurological: Negative for headaches.  All other systems reviewed and are negative.    Observations/Objective: Alert and oriented- answers all questions appropriately No distress Voice hoarse Dry tight cough  Assessment and Plan: Benjamin Hale in today with chief complaint of Covid Exposure   1. COVID-19  virus RNA test result positive at limit of detection Force fluids Rest RTO prn Meds ordered this encounter  Medications  . naproxen (NAPROSYN) 500 MG tablet    Sig: Take 1 tablet (500 mg total) by mouth 2 (two) times daily with a meal.    Dispense:  60 tablet    Refill:  0    Order Specific Question:   Supervising Provider    Answer:   Arville Care A [id number]  . HYDROcodone bit-homatropine (HYCODAN) 5-1.5 MG/5ML syrup    Sig: Take 5 mLs by mouth every 6 (six) hours as needed for cough.    Dispense:  120 mL    Refill:  0    Order Specific Question:   Supervising Provider    Answer:   Arville Care A [id number]  . nirmatrelvir/ritonavir EUA (PAXLOVID) TABS    Sig: Take 3 tablets by mouth 2 (two) times daily for 5 days. (Take nirmatrelvir 150 mg two tablets twice daily for 5 days and ritonavir 100 mg one tablet twice daily for 5 days) Patient GFR is 89    Dispense:  30 tablet    Refill:  0    Order Specific Question:   Supervising Provider    Answer:   Arville Care A [[id number]]      Follow Up Instructions: prn    I discussed the assessment and treatment plan with the patient. The patient was provided an opportunity to ask questions and all were answered. The patient agreed with the plan and demonstrated an understanding of the instructions.   The patient was advised to call back or seek an in-person evaluation if the symptoms worsen or if the  condition fails to improve as anticipated.  The above assessment and management plan was discussed with the patient. The patient verbalized understanding of and has agreed to the management plan. Patient is aware to call the clinic if symptoms persist or worsen. Patient is aware when to return to the clinic for a follow-up visit. Patient educated on when it is appropriate to go to the emergency department.   Time call ended:  2:04  I provided 14 minutes of  non face-to-face time during this encounter.    Mary-Margaret  Daphine Deutscher, [name]

## 2020-11-16 NOTE — Patient Instructions (Signed)
You are being prescribed PAXLOVID for COVID-19 infection.    Please pick up your prescription at: [facility] pharmacy  Address:  [address] [place], [place] [address]         Hours:  Monday - Friday 7:30 am - 6PM                         Saturday 8:00 am - 4:30 pm      Sunday Closed  Phone: [phone]  Please pick up your prescription at: [facility]  Address:  [facility], [address] [place], [place] [address]      Hours:  Monday - Friday 7:30 am - 5:30 pm.  Saturday Closed   Sunday Closed  Phone: [phone]   [facility] ([address] [place], [place], [place] #[phone]) Monday through Friday 7:30a-6p  [facility] [phone], [place], [place] [address] [phone]) Monday through Friday 7:30a-6p  [facility] ([address] [place], [place] #[phone])  Monday through Friday 8a-5:30p  [facility] [address] [place], [place] [address] [phone]) Hours Monday through Friday 8a-7p, Saturday 8a-5p, Sunday 1p-5p    Please pick up your prescription at: [facility]    Please call the pharmacy or go through the drive through vs going inside if you are picking up the mediation yourself to prevent further spread. If prescribed to a [facility] affiliated pharmacy, a pharmacist will bring the medication out to your car.   Medications to hold while taking this treatment: sildenafil  *If asked to hold, you can resume them 24 hours after your last dose   ADMINISTRATION INSTRUCTIONS: 1. Take with or without food. Swallow the tablets whole. Don't chew, crush, or break the medications because it might not work as well  2. For each dose of the medication, you should be taking 3 tablets together (2 pink oval and 1 white oval) TWICE a day for FIVE days   3. Finish your full five-day course of Paxlovid even if you feel better before you're  done. Stopping this medication too early can make it less effective to prevent severe illness related to COVID19.    4. Paxlovid is prescribed for YOU ONLY. Don't share it with others, even if they have similar symptoms as you. This medication might not be right for everyone.  5. Make sure to take steps to protect yourself and others while you're taking this medication in order to get well soon and to prevent others from getting sick with COVID-19.  6. Paxlovid (nirmatrelvir / ritonavir) can cause hormonal birth control medications to not work well. If you or your partner is currently taking hormonal birth control, use condoms or other birth control methods to prevent unintended pregnancies.    COMMON SIDE EFFECTS: 1. Altered or bad taste in your mouth  2. Diarrhea  3. High blood pressure (1% of people) 4. Muscle aches (1% of people)     If your COVID-19 symptoms get worse, get medical help right away. Call 911 if you experience symptoms such as worsening cough, trouble breathing, chest pain that doesn't go away, confusion, a hard time staying awake, and pale or blue-colored skin. This medication won't prevent all COVID-19 cases from getting worse.

## 2021-01-01 ENCOUNTER — Encounter: Payer: Self-pay | Admitting: Nurse Practitioner

## 2021-01-01 ENCOUNTER — Ambulatory Visit: Payer: BC Managed Care – PPO | Admitting: Nurse Practitioner

## 2021-01-01 ENCOUNTER — Other Ambulatory Visit: Payer: Self-pay

## 2021-01-01 VITALS — BP 128/78 | HR 54 | Temp 98.1°F | Resp 20 | Ht 70.0 in | Wt 223.0 lb

## 2021-01-01 DIAGNOSIS — M5412 Radiculopathy, cervical region: Secondary | ICD-10-CM

## 2021-01-01 DIAGNOSIS — E785 Hyperlipidemia, unspecified: Secondary | ICD-10-CM

## 2021-01-01 DIAGNOSIS — E1121 Type 2 diabetes mellitus with diabetic nephropathy: Secondary | ICD-10-CM

## 2021-01-01 DIAGNOSIS — G2581 Restless legs syndrome: Secondary | ICD-10-CM

## 2021-01-01 DIAGNOSIS — I10 Essential (primary) hypertension: Secondary | ICD-10-CM

## 2021-01-01 DIAGNOSIS — F321 Major depressive disorder, single episode, moderate: Secondary | ICD-10-CM

## 2021-01-01 DIAGNOSIS — K219 Gastro-esophageal reflux disease without esophagitis: Secondary | ICD-10-CM | POA: Diagnosis not present

## 2021-01-01 LAB — BAYER DCA HB A1C WAIVED: HB A1C (BAYER DCA - WAIVED): 7.8 % — ABNORMAL HIGH (ref <7.0)

## 2021-01-01 MED ORDER — DIAZEPAM 5 MG PO TABS: ORAL_TABLET | ORAL | 2 refills | Status: DC

## 2021-01-01 MED ORDER — OMEPRAZOLE 20 MG PO CPDR: 20.0000 mg | DELAYED_RELEASE_CAPSULE | Freq: Every day | ORAL | 1 refills | Status: DC | PRN

## 2021-01-01 MED ORDER — OMEGA-3-ACID ETHYL ESTERS 1 G PO CAPS: 1.0000 | ORAL_CAPSULE | Freq: Two times a day (BID) | ORAL | 1 refills | Status: DC

## 2021-01-01 MED ORDER — METOPROLOL TARTRATE 25 MG PO TABS: 25.0000 mg | ORAL_TABLET | Freq: Two times a day (BID) | ORAL | 1 refills | Status: DC

## 2021-01-01 MED ORDER — PRAMIPEXOLE DIHYDROCHLORIDE 0.75 MG PO TABS
0.7500 mg | ORAL_TABLET | Freq: Three times a day (TID) | ORAL | 2 refills | Status: DC
Start: 2021-01-01 — End: 2021-04-11

## 2021-01-01 MED ORDER — FENOFIBRATE 145 MG PO TABS: 145.0000 mg | ORAL_TABLET | Freq: Every day | ORAL | 1 refills | Status: DC

## 2021-01-01 MED ORDER — METFORMIN HCL 1000 MG PO TABS: 1000.0000 mg | ORAL_TABLET | Freq: Two times a day (BID) | ORAL | 1 refills | Status: DC

## 2021-01-01 MED ORDER — ESCITALOPRAM OXALATE 10 MG PO TABS: ORAL_TABLET | ORAL | 1 refills | Status: DC

## 2021-01-01 MED ORDER — AMLODIPINE BESYLATE 10 MG PO TABS: 10.0000 mg | ORAL_TABLET | Freq: Every day | ORAL | 1 refills | Status: DC

## 2021-01-01 MED ORDER — LISINOPRIL 40 MG PO TABS: 40.0000 mg | ORAL_TABLET | Freq: Every day | ORAL | 1 refills | Status: DC

## 2021-01-01 MED ORDER — GLIMEPIRIDE 4 MG PO TABS: 4.0000 mg | ORAL_TABLET | Freq: Every day | ORAL | 1 refills | Status: DC

## 2021-01-01 NOTE — Progress Notes (Signed)
Subjective:    Patient ID: Benjamin Hale, male    DOB: [birth year], 55 y.o.   MRN: [id number]   Chief Complaint: Medical Management of Chronic Issues    HPI:  1. Essential hypertension No c/o chest pain, sob or headaches. Does not check blood pressure at home. BP Readings from Last 3 Encounters:  01/01/21 128/78  10/02/20 133/72  06/22/20 120/73      2. Hyperlipidemia with target LDL less than 100 Does not really watch diet and does no dedicated exercise. Lab Results  Component Value Date   CHOL 123 10/02/2020   HDL 17 (L) 10/02/2020   LDLCALC 49 10/02/2020   TRIG 377 (H) 10/02/2020   CHOLHDL 7.2 (H) 10/02/2020     3. Controlled type 2 diabetes mellitus with diabetic nephropathy, without long-term current use of insulin (HCC) Fasting blood sugars running around 200. He has not been watching his diet at all.  Lab Results  Component Value Date   HGBA1C 7.8 (H) 01/01/2021     4. Gastroesophageal reflux disease without esophagitis Is on omeprazole daily and is doing well.   5. Depression, major, single episode, moderate (HCC) Is on lexapro daily and is doing well. Depression screen [facility] 2/9 01/01/2021 10/02/2020 06/22/2020  Decreased Interest 0 0 0  Down, Depressed, Hopeless 0 0 0  PHQ - 2 Score 0 0 0  Altered sleeping 2 1 0  Tired, decreased energy 1 1 0  Change in appetite 0 0 0  Feeling bad or failure about yourself  0 0 0  Trouble concentrating 0 0 0  Moving slowly or fidgety/restless 0 0 0  Suicidal thoughts 0 0 0  PHQ-9 Score 3 2 0  Difficult doing work/chores Not difficult at all Not difficult at all Not difficult at all  Some recent data might be hidden       Outpatient Encounter Medications as of 01/01/2021  Medication Sig   albuterol (VENTOLIN HFA) 108 (90 Base) MCG/ACT inhaler Inhale 2 puffs into the lungs every 6 (six) hours as needed for wheezing or shortness of breath.   amLODipine (NORVASC) 10 MG tablet Take 1 tablet (10 mg total) by mouth  daily.   aspirin EC 81 MG tablet Take 81 mg by mouth daily.   blood glucose meter kit and supplies Dispense based on patient and insurance preference. Use up to four times daily as directed. (FOR ICD-10 E10.9, E11.9).   diazepam (VALIUM) 5 MG tablet TAKE 1 TABLET EVERY 12 HOURS AS NEEDED FOR ANXIETY   escitalopram (LEXAPRO) 10 MG tablet TAKE ONE (1) TABLET EACH DAY   fenofibrate (TRICOR) 145 MG tablet Take 1 tablet (145 mg total) by mouth daily.   fluticasone (FLONASE) 50 MCG/ACT nasal spray Place 2 sprays into the nose daily as needed for rhinitis or allergies.   glimepiride (AMARYL) 4 MG tablet Take 1 tablet (4 mg total) by mouth daily before breakfast.   glucose blood (ONETOUCH VERIO) test strip Test BS QID Dx E11.9   HYDROcodone bit-homatropine (HYCODAN) 5-1.5 MG/5ML syrup Take 5 mLs by mouth every 6 (six) hours as needed for cough.   ibuprofen (ADVIL,MOTRIN) 200 MG tablet Take 400-800 mg by mouth every 6 (six) hours as needed (For headache or pain.).   lisinopril (ZESTRIL) 40 MG tablet Take 1 tablet (40 mg total) by mouth daily.   metFORMIN (GLUCOPHAGE) 1000 MG tablet Take 1 tablet (1,000 mg total) by mouth 2 (two) times daily with a meal.   metoprolol tartrate (LOPRESSOR)  25 MG tablet Take 1 tablet (25 mg total) by mouth 2 (two) times daily.   Multiple Vitamin (MULTIVITAMIN WITH MINERALS) TABS tablet Take 1 tablet by mouth daily.   naproxen (NAPROSYN) 500 MG tablet Take 1 tablet (500 mg total) by mouth 2 (two) times daily with a meal.   nitroGLYCERIN (NITROSTAT) 0.4 MG SL tablet Place 1 tablet (0.4 mg total) under the tongue every 5 (five) minutes as needed for chest pain.   omega-3 acid ethyl esters (LOVAZA) 1 g capsule Take 1 capsule (1 g total) by mouth 2 (two) times daily.   omeprazole (PRILOSEC) 20 MG capsule Take 1 capsule (20 mg total) by mouth daily as needed (for acid reflux). Acid reflux.   ONETOUCH DELICA LANCETS 35K MISC EVERY DAY   Probiotic Product (PROBIOTIC PO) Take 1  capsule by mouth daily.   sildenafil (REVATIO) 20 MG tablet Take 1 tablet (20 mg total) by mouth 3 (three) times daily.   No facility-administered encounter medications on file as of 01/01/2021.    Past Surgical History:  Procedure Laterality Date   ANTERIOR CERVICAL DECOMP/DISCECTOMY FUSION N/A 09/22/2012   Procedure: ANTERIOR CERVICAL DECOMPRESSION/DISCECTOMY FUSION 2 LEVELS;  Surgeon: Floyce Stakes, MD;  Location: [facility];  Service: Neurosurgery;  Laterality: N/A;  Cervical five-six Cervical six-seven Anterior cervical decompression/diskectomy/fusion   ATRIAL FLUTTER ABLATION  12/23/2012   CTI ablation by Dr Rayann Heman   ATRIAL FLUTTER ABLATION N/A 12/23/2012   Procedure: ATRIAL FLUTTER ABLATION;  Surgeon: Thompson Grayer, MD;  Location: [facility] CATH LAB;  Service: Cardiovascular;  Laterality: N/A;   CARDIAC CATHETERIZATION N/A 11/16/2015   Procedure: Left Heart Cath and Coronary Angiography;  Surgeon: Leonie Man, MD;  Location: [place] CV LAB;  Service: Cardiovascular;  Laterality: N/A;   CARDIOVERSION N/A 09/23/2012   Procedure: CARDIOVERSION;  Surgeon: Renella Cunas, MD;  Location: [place];  Service: Cardiovascular;  Laterality: N/A;   TONSILLECTOMY  1973   TREATMENT FISTULA ANAL  ~ 2007   WISDOM TOOTH EXTRACTION      Family History  Problem Relation Age of Onset   Lung cancer Mother        died @ 66   Brain cancer Father        died @ 65   Diabetes Maternal Uncle    Diabetes Maternal Grandfather     New complaints: -legs feel like spiders are crawling on his legs when he lays down in bed.  - had neck surgery 10 years ago and now neck is starting to bother him again. Last MR of cervical showed stenosis of c6-c7. Only way he can take edge off pain is to take valium and motrin. He saw botero in 2018. Marland Kitchen Social history: Lives with wife and son.  Controlled substance contract: n/a      Review of Systems  Constitutional:  Negative for diaphoresis.  Eyes:  Negative for pain.   Respiratory:  Negative for shortness of breath.   Cardiovascular:  Negative for chest pain, palpitations and leg swelling.  Gastrointestinal:  Negative for abdominal pain.  Endocrine: Negative for polydipsia.  Skin:  Negative for rash.  Neurological:  Negative for dizziness, weakness and headaches.  Hematological:  Does not bruise/bleed easily.  All other systems reviewed and are negative.     Objective:   Physical Exam Vitals and nursing note reviewed.  Constitutional:      Appearance: Normal appearance. He is well-developed.  HENT:     Head: Normocephalic.  Nose: Nose normal.  Eyes:     Pupils: Pupils are equal, round, and reactive to light.  Neck:     Thyroid: No thyroid mass or thyromegaly.     Vascular: No carotid bruit or JVD.     Trachea: Phonation normal.  Cardiovascular:     Rate and Rhythm: Normal rate and regular rhythm.  Pulmonary:     Effort: Pulmonary effort is normal. No respiratory distress.     Breath sounds: Normal breath sounds.  Abdominal:     General: Bowel sounds are normal.     Palpations: Abdomen is soft.     Tenderness: There is no abdominal tenderness.  Musculoskeletal:        General: Normal range of motion.     Cervical back: Normal range of motion and neck supple.     Comments: pain on rotation of cervical spine  Grips equal bil Motor strength and sensation of upper ext  intact  Lymphadenopathy:     Cervical: No cervical adenopathy.  Skin:    General: Skin is warm and dry.  Neurological:     Mental Status: He is alert and oriented to person, place, and time.  Psychiatric:        Behavior: Behavior normal.        Thought Content: Thought content normal.        Judgment: Judgment normal.    BP 128/78   Pulse (!) 54   Temp 98.1 F (36.7 C) (Temporal)   Resp 20   Ht $R'5\' 10"'Jy$  (1.778 m)   Wt 223 lb (101.2 kg)   SpO2 95%   BMI 32.00 kg/m        Assessment & Plan:   KEIJI MELLAND comes in today with chief complaint of  Medical Management of Chronic Issues   Diagnosis and orders addressed:  1. Essential hypertension Low sodium diet - CBC with Differential/Platelet - CMP14+EGFR - lisinopril (ZESTRIL) 40 MG tablet; Take 1 tablet (40 mg total) by mouth daily.  Dispense: 90 tablet; Refill: 1 - metoprolol tartrate (LOPRESSOR) 25 MG tablet; Take 1 tablet (25 mg total) by mouth 2 (two) times daily.  Dispense: 180 tablet; Refill: 1 - amLODipine (NORVASC) 10 MG tablet; Take 1 tablet (10 mg total) by mouth daily.  Dispense: 90 tablet; Refill: 1  2. Hyperlipidemia with target LDL less than 100 Low fat diet - Lipid panel - fenofibrate (TRICOR) 145 MG tablet; Take 1 tablet (145 mg total) by mouth daily.  Dispense: 90 tablet; Refill: 1 - omega-3 acid ethyl esters (LOVAZA) 1 g capsule; Take 1 capsule (1 g total) by mouth 2 (two) times daily.  Dispense: 180 capsule; Refill: 1  3. Controlled type 2 diabetes mellitus with diabetic nephropathy, without long-term current use of insulin (HCC) Strict carb counting - Bayer DCA Hb A1c Waived - glimepiride (AMARYL) 4 MG tablet; Take 1 tablet (4 mg total) by mouth daily before breakfast.  Dispense: 90 tablet; Refill: 1 - metFORMIN (GLUCOPHAGE) 1000 MG tablet; Take 1 tablet (1,000 mg total) by mouth 2 (two) times daily with a meal.  Dispense: 180 tablet; Refill: 1  4. Gastroesophageal reflux disease without esophagitis Avoid spicy foods Do not eat 2 hours prior to bedtime - omeprazole (PRILOSEC) 20 MG capsule; Take 1 capsule (20 mg total) by mouth daily as needed (for acid reflux). Acid reflux.  Dispense: 90 capsule; Refill: 1  5. Depression, major, single episode, moderate (HCC) Stress management - escitalopram (LEXAPRO) 10 MG tablet; TAKE ONE (1)  TABLET EACH DAY  Dispense: 90 tablet; Refill: 1  6. Cervical neuropathic pain Moist heat rest - Ambulatory referral to Neurosurgery - diazepam (VALIUM) 5 MG tablet; TAKE 1 TABLET EVERY 12 HOURS AS NEEDED FOR ANXIETY  Dispense:  30 tablet; Refill: 2  7. Restless leg syndrome Keep legs warm at night - pramipexole (MIRAPEX) 0.75 MG tablet; Take 1 tablet (0.75 mg total) by mouth 3 (three) times daily.  Dispense: 30 tablet; Refill: 2    Labs pending Health Maintenance reviewed Diet and exercise encouraged  Follow up plan: 3 months   Mary-Margaret Hassell Done, [name]

## 2021-01-01 NOTE — Patient Instructions (Signed)
Restless Legs Syndrome Restless legs syndrome is a condition that causes uncomfortable feelings or sensations in the legs, especially while sitting or lying down. The sensations usually cause an overwhelming urge to move the legs. The arms can alsosometimes be affected. The condition can range from mild to severe. The symptoms often interfere witha person's ability to sleep. What are the causes? The cause of this condition is not known. What increases the risk? The following factors may make you more likely to develop this condition: Being older than 50. Pregnancy. Being a woman. In general, the condition is more common in women than in men. A family history of the condition. Having iron deficiency. Overuse of caffeine, nicotine, or alcohol. Certain medical conditions, such as kidney disease, Parkinson's disease, or nerve damage. Certain medicines, such as those for high blood pressure, nausea, colds, allergies, depression, and some heart conditions. What are the signs or symptoms? The main symptom of this condition is uncomfortable sensations in the legs, such as: Pulling. Tingling. Prickling. Throbbing. Crawling. Burning. Usually, the sensations: Affect both sides of the body. Are worse when you sit or lie down. Are worse at night. These may wake you up or make it difficult to fall asleep. Make you have a strong urge to move your legs. Are temporarily relieved by moving your legs. The arms can also be affected, but this is rare. People who have this conditionoften have tiredness during the day because of their lack of sleep at night. How is this diagnosed? This condition may be diagnosed based on: Your symptoms. Blood tests. In some cases, you may be monitored in a sleep lab by a specialist (a sleep study). This can detect any disruptions in your sleep. How is this treated? This condition is treated by managing the symptoms. This may include: Lifestyle changes, such as  exercising, using relaxation techniques, and avoiding caffeine, alcohol, or tobacco. Medicines. Anti-seizure medicines may be tried first. Follow these instructions at home: General instructions Take over-the-counter and prescription medicines only as told by your health care provider. Use methods to help relieve the uncomfortable sensations, such as: Massaging your legs. Walking or stretching. Taking a cold or hot bath. Keep all follow-up visits as told by your health care provider. This is important. Lifestyle     Practice good sleep habits. For example, go to bed and get up at the same time every day. Most adults should get 7-9 hours of sleep each night. Exercise regularly. Try to get at least 30 minutes of exercise most days of the week. Practice ways of relaxing, such as yoga or meditation. Avoid caffeine and alcohol. Do not use any products that contain nicotine or tobacco, such as cigarettes and e-cigarettes. If you need help quitting, ask your health care provider. Contact a health care provider if: Your symptoms get worse or they do not improve with treatment. Summary Restless legs syndrome is a condition that causes uncomfortable feelings or sensations in the legs, especially while sitting or lying down. The symptoms often interfere with a person's ability to sleep. This condition is treated by managing the symptoms. You may need to make lifestyle changes or take medicines. This information is not intended to replace advice given to you by your health care provider. Make sure you discuss any questions you have with your healthcare provider. Document Revised: 07/22/2019 Document Reviewed: 06/22/2017 Elsevier Patient Education  2022 Elsevier Inc.  

## 2021-01-02 LAB — CBC WITH DIFFERENTIAL/PLATELET
Basophils Absolute: 0.1 10*3/uL (ref 0.0–0.2)
Basos: 1 %
EOS (ABSOLUTE): 0.3 10*3/uL (ref 0.0–0.4)
Eos: 3 %
Hematocrit: 44.1 % (ref 37.5–51.0)
Hemoglobin: 15.4 g/dL (ref 13.0–17.7)
Immature Grans (Abs): 0 10*3/uL (ref 0.0–0.1)
Immature Granulocytes: 1 %
Lymphocytes Absolute: 2.4 10*3/uL (ref 0.7–3.1)
Lymphs: 29 %
MCH: 30.7 pg (ref 26.6–33.0)
MCHC: 34.9 g/dL (ref 31.5–35.7)
MCV: 88 fL (ref 79–97)
Monocytes Absolute: 0.7 10*3/uL (ref 0.1–0.9)
Monocytes: 9 %
Neutrophils Absolute: 4.8 10*3/uL (ref 1.4–7.0)
Neutrophils: 57 %
Platelets: 255 10*3/uL (ref 150–450)
RBC: 5.02 x10E6/uL (ref 4.14–5.80)
RDW: 13.1 % (ref 11.6–15.4)
WBC: 8.4 10*3/uL (ref 3.4–10.8)

## 2021-01-02 LAB — CMP14+EGFR
ALT: 58 IU/L — ABNORMAL HIGH (ref 0–44)
AST: 52 IU/L — ABNORMAL HIGH (ref 0–40)
Albumin/Globulin Ratio: 1.6 (ref 1.2–2.2)
Albumin: 4.4 g/dL (ref 3.8–4.9)
Alkaline Phosphatase: 59 IU/L (ref 44–121)
BUN/Creatinine Ratio: 11 (ref 9–20)
BUN: 11 mg/dL (ref 6–24)
Bilirubin Total: 0.8 mg/dL (ref 0.0–1.2)
CO2: 20 mmol/L (ref 20–29)
Calcium: 9 mg/dL (ref 8.7–10.2)
Chloride: 100 mmol/L (ref 96–106)
Creatinine, Ser: 0.98 mg/dL (ref 0.76–1.27)
Globulin, Total: 2.7 g/dL (ref 1.5–4.5)
Glucose: 260 mg/dL — ABNORMAL HIGH (ref 65–99)
Potassium: 4.2 mmol/L (ref 3.5–5.2)
Sodium: 138 mmol/L (ref 134–144)
Total Protein: 7.1 g/dL (ref 6.0–8.5)
eGFR: 92 mL/min/{1.73_m2} (ref >59)

## 2021-01-02 LAB — LIPID PANEL
Chol/HDL Ratio: 6.9 ratio — ABNORMAL HIGH (ref 0.0–5.0)
Cholesterol, Total: 118 mg/dL (ref 100–199)
HDL: 17 mg/dL — ABNORMAL LOW (ref >39)
LDL Chol Calc (NIH): 42 mg/dL (ref 0–99)
Triglycerides: 399 mg/dL — ABNORMAL HIGH (ref 0–149)
VLDL Cholesterol Cal: 59 mg/dL — ABNORMAL HIGH (ref 5–40)

## 2021-01-07 DIAGNOSIS — M542 Cervicalgia: Secondary | ICD-10-CM | POA: Diagnosis not present

## 2021-01-11 ENCOUNTER — Other Ambulatory Visit: Payer: Self-pay | Admitting: Neurosurgery

## 2021-01-11 DIAGNOSIS — M542 Cervicalgia: Secondary | ICD-10-CM

## 2021-01-16 ENCOUNTER — Ambulatory Visit
Admission: RE | Admit: 2021-01-16 | Discharge: 2021-01-16 | Disposition: A | Discharge status: Home / Self Care | Payer: 59 | Source: Ambulatory Visit | Attending: Neurosurgery | Admitting: Neurosurgery

## 2021-01-16 DIAGNOSIS — M5021 Other cervical disc displacement,  high cervical region: Secondary | ICD-10-CM | POA: Diagnosis not present

## 2021-01-16 DIAGNOSIS — Z6832 Body mass index (BMI) 32.0-32.9, adult: Secondary | ICD-10-CM | POA: Diagnosis not present

## 2021-01-16 DIAGNOSIS — M50221 Other cervical disc displacement at C4-C5 level: Secondary | ICD-10-CM | POA: Diagnosis not present

## 2021-01-16 DIAGNOSIS — S129XXA Fracture of neck, unspecified, initial encounter: Secondary | ICD-10-CM | POA: Diagnosis not present

## 2021-01-16 DIAGNOSIS — M542 Cervicalgia: Secondary | ICD-10-CM

## 2021-01-16 DIAGNOSIS — I10 Essential (primary) hypertension: Secondary | ICD-10-CM | POA: Diagnosis not present

## 2021-01-16 DIAGNOSIS — R200 Anesthesia of skin: Secondary | ICD-10-CM | POA: Diagnosis not present

## 2021-01-30 DIAGNOSIS — M960 Pseudarthrosis after fusion or arthrodesis: Secondary | ICD-10-CM | POA: Diagnosis not present

## 2021-02-11 ENCOUNTER — Ambulatory Visit: Payer: BC Managed Care – PPO | Admitting: Nurse Practitioner

## 2021-02-11 ENCOUNTER — Encounter: Payer: Self-pay | Admitting: Nurse Practitioner

## 2021-02-11 ENCOUNTER — Other Ambulatory Visit: Payer: Self-pay

## 2021-02-11 VITALS — BP 127/75 | HR 60 | Temp 98.4°F | Resp 20 | Ht 70.0 in | Wt 221.0 lb

## 2021-02-11 DIAGNOSIS — B0229 Other postherpetic nervous system involvement: Secondary | ICD-10-CM

## 2021-02-11 MED ORDER — GABAPENTIN 300 MG PO CAPS: 300.0000 mg | ORAL_CAPSULE | Freq: Two times a day (BID) | ORAL | 0 refills | Status: DC

## 2021-02-11 NOTE — Progress Notes (Signed)
   Subjective:    Patient ID: Benjamin Hale, male    DOB: February 23, 1966, 55 y.o.   MRN: [id number]   Chief Complaint: Flank Pain (Left side/Moving furniture a couple weeks ago and thinks it may be from that. Had some steroids at home and took 3 and it helped)   HPI Patient come soin c/o left flank pain. Has been going on fo 2 weeks. He was moving furniture and thinks he pulled a muscle. Tuesday after moving furniture the pain developed on left flank. Tried pain meds and no relief. Heat helped some. TENS unit helped a little. This past Saturday he started prednisone and thayt helps until "wears off" starts again. No urinary problems, no hematuria.ha shad back pain for awhile and does not know if related or not. Rates pain 4/10 currently. Standing helps. Laying and sitting increase pain. The steroids have increased his boood sugar to over 3oo. His blood presure has also been running high. BP Readings from Last 3 Encounters:  02/11/21 127/75  01/01/21 128/78  10/02/20 133/72   Lab Results  Component Value Date   HGBA1C 7.8 (H) 01/01/2021       Review of Systems  Constitutional: Negative.   HENT: Negative.    Respiratory: Negative.    Cardiovascular: Negative.   Genitourinary: Negative.   Neurological: Negative.   Psychiatric/Behavioral: Negative.        Objective:   Physical Exam Vitals reviewed.  Constitutional:      Appearance: Normal appearance.  Cardiovascular:     Rate and Rhythm: Normal rate and regular rhythm.     Heart sounds: Normal heart sounds.  Pulmonary:     Breath sounds: Normal breath sounds.  Skin:    Comments: Erythematous rash on left flank around a dermatome.   Neurological:     General: No focal deficit present.     Mental Status: He is alert and oriented to person, place, and time.    BP 127/75   Pulse 60   Temp 98.4 F (36.9 C) (Temporal)   Resp 20   Ht 5\' 10"  (1.778 m)   Wt 221 lb (100.2 kg)   SpO2 95%   BMI 31.71 kg/m         Assessment & Plan:   in today with chief complaint of Flank Pain (Left side/Moving furniture a couple weeks ago and thinks it may be from that. Had some steroids at home and took 3 and it helped)   1. Post herpetic neuralgia Has had to long for valtrex Ice or heat if helps Do not take anymore prednisone if can help it Watch carbs in diet RTO prn - gabapentin (NEURONTIN) 300 MG capsule; Take 1 capsule (300 mg total) by mouth 2 (two) times daily.  Dispense: 60 capsule; Refill: 0    The above assessment and management plan was discussed with the patient. The patient verbalized understanding of and has agreed to the management plan. Patient is aware to call the clinic if symptoms persist or worsen. Patient is aware when to return to the clinic for a follow-up visit. Patient educated on when it is appropriate to go to the emergency department.   Mary-Margaret [name], [name]

## 2021-02-11 NOTE — Patient Instructions (Signed)
Postherpetic Neuralgia Postherpetic neuralgia (PHN) is nerve pain that occurs after a shingles infection. Shingles is a painful rash that appears on one area of the body, usually on the trunk or face. Shingles is caused by the varicella-zoster virus. This is the same virus that causes chickenpox. In people who have hadchickenpox, the virus can resurface years later and cause shingles. PHN appears in the same area where you had the shingles rash. The pain usually goes away after the rash disappears. You may have PHN if you continue to havepain 3 months after your shingles rash has gone away. What are the causes? This condition is caused by damage to your nerves due to inflammation from thevaricella-zoster virus. The damage makes your nerves overly sensitive. What increases the risk? The following factors may make you more likely to develop this condition: Being older than 55 years of age. Having severe pain before your shingles rash starts. Having a severe rash. Having shingles in and around the eye area. Having a disease or taking medicine that causes you to have a weakened disease-fighting system (immune system). What are the signs or symptoms? The main symptom of this condition is pain. The pain may: Often be severe and may be described as stabbing, burning, shooting, or feeling like an electric shock. Come and go, or it may be there all the time. Be triggered by light touches on the skin or changes in temperature. You may have itching along with the pain. How is this diagnosed? This condition may be diagnosed based on your symptoms and your history ofshingles. Lab studies and other diagnostic tests are usually not needed. How is this treated? There is no cure for this condition. Treatment for PHN will focus on pain relief. Over-the-counter pain relievers do not usually relieve PHN pain. You may need to work with a pain specialist. Treatment may include: Anti-seizure medicines to relieve  nerve pain. Antidepressant medicines to help with pain and improve sleep. A numbing patch worn on the skin (lidocaine patch). Strong pain relievers (opioids). Injections of numbing medicine or anti-inflammatory medicines around irritated nerves. Injections of botulinum toxin to block pain signals between nerves and muscles. Follow these instructions at home: Medicines Take over-the-counter and prescription medicines only as told by your health care provider. Ask your health care provider if the medicine prescribed to you: Requires you to avoid driving or using machinery. Can cause constipation. You may need to take these actions to prevent or treat constipation: Drink enough fluid to keep your urine pale yellow. Take over-the-counter or prescription medicines. Eat foods that are high in fiber, such as beans, whole grains, and fresh fruits and vegetables. Limit foods that are high in fat and processed sugars, such as fried or sweet foods. Managing pain  If directed, put ice on the painful area. To do this: Put ice in a plastic bag. Place a towel between your skin and the bag. Leave the ice on for 20 minutes, 2-3 times a day. Remove the ice if your skin turns bright red. This is very important. If you cannot feel pain, heat, or cold, you have a greater risk of damage to the area. Cover sensitive areas with a bandage (dressing) to reduce friction from clothing rubbing on the area.  General instructions It may take a long time to recover from PHN. Work closely with your health care provider and develop a good support system at home. You may consider joining a support group. Wear loose, comfortable clothing. Talk to your health   care provider if you feel depressed or desperate. Living with long-term pain can be depressing. Keep all follow-up visits. This is important. How is this prevented? Getting a vaccination for shingles can prevent PHN. The shingles vaccine is recommended for people  older than age 50. It may prevent shingles and may alsolower your risk of PHN if you do get shingles. Contact a health care provider if: Your medicine is not helping. You are struggling to manage your pain at home. Get help right away if: You have thoughts about hurting yourself or others. If you ever feel like you may hurt yourself or others, or have thoughts about taking your own life, get help right away. Go to your nearest emergency department or: Call your local emergency services (911 in the [place] Call a suicide crisis helpline, such as the National Suicide Prevention Lifeline at 1-800-273-8255. This is open 24 hours a day in the [place] Text the Crisis Text Line at 741741 (in the [place] Summary Postherpetic neuralgia (PHN) is a very painful disorder that can occur after an episode of shingles. The pain is often severe and may be described as stabbing, burning, shooting, or feeling like an electric shock. Prescription medicines can be helpful in managing persistent pain. Getting a vaccination for shingles can prevent PHN. This vaccine is recommended for people older than age 50. This information is not intended to replace advice given to you by your health care provider. Make sure you discuss any questions you have with your healthcare provider. Document Revised: 05/28/2020 Document Reviewed: 05/28/2020 Elsevier Patient Education  2022 Elsevier Inc.  

## 2021-04-11 ENCOUNTER — Ambulatory Visit: Payer: BC Managed Care – PPO | Admitting: Nurse Practitioner

## 2021-04-11 ENCOUNTER — Encounter: Payer: Self-pay | Admitting: Nurse Practitioner

## 2021-04-11 ENCOUNTER — Other Ambulatory Visit: Payer: Self-pay

## 2021-04-11 VITALS — BP 135/81 | HR 73 | Temp 98.6°F | Resp 20 | Ht 70.0 in | Wt 222.0 lb

## 2021-04-11 DIAGNOSIS — E785 Hyperlipidemia, unspecified: Secondary | ICD-10-CM

## 2021-04-11 DIAGNOSIS — K219 Gastro-esophageal reflux disease without esophagitis: Secondary | ICD-10-CM | POA: Diagnosis not present

## 2021-04-11 DIAGNOSIS — I10 Essential (primary) hypertension: Secondary | ICD-10-CM | POA: Diagnosis not present

## 2021-04-11 DIAGNOSIS — E1121 Type 2 diabetes mellitus with diabetic nephropathy: Secondary | ICD-10-CM | POA: Diagnosis not present

## 2021-04-11 DIAGNOSIS — M5412 Radiculopathy, cervical region: Secondary | ICD-10-CM

## 2021-04-11 DIAGNOSIS — F321 Major depressive disorder, single episode, moderate: Secondary | ICD-10-CM

## 2021-04-11 DIAGNOSIS — G2581 Restless legs syndrome: Secondary | ICD-10-CM

## 2021-04-11 LAB — CBC WITH DIFFERENTIAL/PLATELET
Basophils Absolute: 0.1 10*3/uL (ref 0.0–0.2)
Basos: 1 %
EOS (ABSOLUTE): 0.2 10*3/uL (ref 0.0–0.4)
Eos: 3 %
Hematocrit: 43.6 % (ref 37.5–51.0)
Hemoglobin: 15.2 g/dL (ref 13.0–17.7)
Immature Grans (Abs): 0 10*3/uL (ref 0.0–0.1)
Immature Granulocytes: 0 %
Lymphocytes Absolute: 2.1 10*3/uL (ref 0.7–3.1)
Lymphs: 28 %
MCH: 30.2 pg (ref 26.6–33.0)
MCHC: 34.9 g/dL (ref 31.5–35.7)
MCV: 87 fL (ref 79–97)
Monocytes Absolute: 0.6 10*3/uL (ref 0.1–0.9)
Monocytes: 8 %
Neutrophils Absolute: 4.7 10*3/uL (ref 1.4–7.0)
Neutrophils: 60 %
Platelets: 240 10*3/uL (ref 150–450)
RBC: 5.04 x10E6/uL (ref 4.14–5.80)
RDW: 13 % (ref 11.6–15.4)
WBC: 7.8 10*3/uL (ref 3.4–10.8)

## 2021-04-11 LAB — LIPID PANEL
Chol/HDL Ratio: 6.8 ratio — ABNORMAL HIGH (ref 0.0–5.0)
Cholesterol, Total: 122 mg/dL (ref 100–199)
HDL: 18 mg/dL — ABNORMAL LOW (ref >39)
LDL Chol Calc (NIH): 55 mg/dL (ref 0–99)
Triglycerides: 316 mg/dL — ABNORMAL HIGH (ref 0–149)
VLDL Cholesterol Cal: 49 mg/dL — ABNORMAL HIGH (ref 5–40)

## 2021-04-11 LAB — CMP14+EGFR
ALT: 44 IU/L (ref 0–44)
AST: 42 IU/L — ABNORMAL HIGH (ref 0–40)
Albumin/Globulin Ratio: 2 (ref 1.2–2.2)
Albumin: 4.5 g/dL (ref 3.8–4.9)
Alkaline Phosphatase: 57 IU/L (ref 44–121)
BUN/Creatinine Ratio: 14 (ref 9–20)
BUN: 12 mg/dL (ref 6–24)
Bilirubin Total: 0.7 mg/dL (ref 0.0–1.2)
CO2: 21 mmol/L (ref 20–29)
Calcium: 8.9 mg/dL (ref 8.7–10.2)
Chloride: 99 mmol/L (ref 96–106)
Creatinine, Ser: 0.88 mg/dL (ref 0.76–1.27)
Globulin, Total: 2.3 g/dL (ref 1.5–4.5)
Glucose: 250 mg/dL — ABNORMAL HIGH (ref 70–99)
Potassium: 4 mmol/L (ref 3.5–5.2)
Sodium: 137 mmol/L (ref 134–144)
Total Protein: 6.8 g/dL (ref 6.0–8.5)
eGFR: 102 mL/min/{1.73_m2} (ref >59)

## 2021-04-11 LAB — BAYER DCA HB A1C WAIVED: HB A1C (BAYER DCA - WAIVED): 7.7 % — ABNORMAL HIGH (ref 4.8–5.6)

## 2021-04-11 MED ORDER — METOPROLOL TARTRATE 25 MG PO TABS: 25.0000 mg | ORAL_TABLET | Freq: Two times a day (BID) | ORAL | 1 refills | Status: DC

## 2021-04-11 MED ORDER — GLIMEPIRIDE 4 MG PO TABS: 4.0000 mg | ORAL_TABLET | Freq: Every day | ORAL | 1 refills | Status: DC

## 2021-04-11 MED ORDER — OMEPRAZOLE 20 MG PO CPDR: 20.0000 mg | DELAYED_RELEASE_CAPSULE | Freq: Every day | ORAL | 1 refills | Status: DC | PRN

## 2021-04-11 MED ORDER — METFORMIN HCL 1000 MG PO TABS: 1000.0000 mg | ORAL_TABLET | Freq: Two times a day (BID) | ORAL | 1 refills | Status: DC

## 2021-04-11 MED ORDER — FENOFIBRATE 145 MG PO TABS: 145.0000 mg | ORAL_TABLET | Freq: Every day | ORAL | 1 refills | Status: DC

## 2021-04-11 MED ORDER — OMEGA-3-ACID ETHYL ESTERS 1 G PO CAPS: 1.0000 | ORAL_CAPSULE | Freq: Two times a day (BID) | ORAL | 1 refills | Status: DC

## 2021-04-11 MED ORDER — LISINOPRIL 40 MG PO TABS: 40.0000 mg | ORAL_TABLET | Freq: Every day | ORAL | 1 refills | Status: DC

## 2021-04-11 MED ORDER — AMLODIPINE BESYLATE 10 MG PO TABS: 10.0000 mg | ORAL_TABLET | Freq: Every day | ORAL | 1 refills | Status: DC

## 2021-04-11 MED ORDER — PRAMIPEXOLE DIHYDROCHLORIDE 0.75 MG PO TABS: 0.7500 mg | ORAL_TABLET | Freq: Three times a day (TID) | ORAL | 1 refills | Status: DC

## 2021-04-11 MED ORDER — ESCITALOPRAM OXALATE 10 MG PO TABS: ORAL_TABLET | ORAL | 1 refills | Status: DC

## 2021-04-11 NOTE — Progress Notes (Signed)
Subjective:    Patient ID: Benjamin Hale, male    DOB: [birth year], 55 y.o.   MRN: [id number]   Chief Complaint: medical management of chronic issues     HPI:  1. Essential hypertension No c/o chest pain, sob or headache.. Does not check blood pressure at home. BP Readings from Last 3 Encounters:  02/11/21 127/75  01/01/21 128/78  10/02/20 133/72     2. Hyperlipidemia with target LDL less than 100 Doe snot watch diet and does no dedicated exercise. Lab Results  Component Value Date   CHOL 118 01/01/2021   HDL 17 (L) 01/01/2021   LDLCALC 42 01/01/2021   TRIG 399 (H) 01/01/2021   CHOLHDL 6.9 (H) 01/01/2021     3. Controlled type 2 diabetes mellitus with diabetic nephropathy, without long-term current use of insulin (HCC) Fasting blood sugars are around 200. He does not watch his diet. Lab Results  Component Value Date   HGBA1C 7.8 (H) 01/01/2021     4. Gastroesophageal reflux disease without esophagitis Is on omeprazle daily and is doing well GAD 7 : Generalized Anxiety Score 04/11/2021 02/11/2021 01/01/2021 10/02/2020  Nervous, Anxious, on Edge 0 0 0 0  Control/stop worrying 0 0 0 1  Worry too much - different things 0 0 0 1  Trouble relaxing 0 1 0 0  Restless 0 0 0 0  Easily annoyed or irritable 0 0 0 0  Afraid - awful might happen 0 0 0 0  Total GAD 7 Score 0 1 0 2  Anxiety Difficulty Not difficult at all Not difficult at all Not difficult at all Not difficult at all      5. Depression, major, single episode, moderate (HCC) Is on lexapro daily and is doing well. Depression screen [facility] 2/9 04/11/2021 02/11/2021 01/01/2021  Decreased Interest 2 1 0  Down, Depressed, Hopeless 1 0 0  PHQ - 2 Score 3 1 0  Altered sleeping $RemoveBeforeDE'1 1 2  'osSLmpuOtLVfbzb$ Tired, decreased energy $RemoveBeforeDE'1 1 1  'QijpKDKVUbnTcEY$ Change in appetite 0 0 0  Feeling bad or failure about yourself  0 0 0  Trouble concentrating 0 0 0  Moving slowly or fidgety/restless 0 0 0  Suicidal thoughts 0 0 0  PHQ-9 Score $RemoveBef'5 3 3  'hQSxqBghwe$ Difficult  doing work/chores Not difficult at all Not difficult at all Not difficult at all  Some recent data might be hidden     6. Restless leg syndrome Is on mirapex nighty which helps his legs to rest.  7. Cervical neuropathic pain He is on neurontin. He also has bone growth stimulator. Has follow up with neuro nov 21 to discuss options from here.    Outpatient Encounter Medications as of 04/11/2021  Medication Sig   albuterol (VENTOLIN HFA) 108 (90 Base) MCG/ACT inhaler Inhale 2 puffs into the lungs every 6 (six) hours as needed for wheezing or shortness of breath.   amLODipine (NORVASC) 10 MG tablet Take 1 tablet (10 mg total) by mouth daily.   aspirin EC 81 MG tablet Take 81 mg by mouth daily.   blood glucose meter kit and supplies Dispense based on patient and insurance preference. Use up to four times daily as directed. (FOR ICD-10 E10.9, E11.9).   diazepam (VALIUM) 5 MG tablet TAKE 1 TABLET EVERY 12 HOURS AS NEEDED FOR ANXIETY   escitalopram (LEXAPRO) 10 MG tablet TAKE ONE (1) TABLET EACH DAY   fenofibrate (TRICOR) 145 MG tablet Take 1 tablet (145 mg total) by mouth daily.  fluticasone (FLONASE) 50 MCG/ACT nasal spray Place 2 sprays into the nose daily as needed for rhinitis or allergies.   gabapentin (NEURONTIN) 300 MG capsule Take 1 capsule (300 mg total) by mouth 2 (two) times daily.   glimepiride (AMARYL) 4 MG tablet Take 1 tablet (4 mg total) by mouth daily before breakfast.   glucose blood (ONETOUCH VERIO) test strip Test BS QID Dx E11.9   ibuprofen (ADVIL,MOTRIN) 200 MG tablet Take 400-800 mg by mouth every 6 (six) hours as needed (For headache or pain.).   lisinopril (ZESTRIL) 40 MG tablet Take 1 tablet (40 mg total) by mouth daily.   metFORMIN (GLUCOPHAGE) 1000 MG tablet Take 1 tablet (1,000 mg total) by mouth 2 (two) times daily with a meal.   metoprolol tartrate (LOPRESSOR) 25 MG tablet Take 1 tablet (25 mg total) by mouth 2 (two) times daily.   Multiple Vitamin (MULTIVITAMIN  WITH MINERALS) TABS tablet Take 1 tablet by mouth daily.   naproxen (NAPROSYN) 500 MG tablet Take 1 tablet (500 mg total) by mouth 2 (two) times daily with a meal.   nitroGLYCERIN (NITROSTAT) 0.4 MG SL tablet Place 1 tablet (0.4 mg total) under the tongue every 5 (five) minutes as needed for chest pain.   omega-3 acid ethyl esters (LOVAZA) 1 g capsule Take 1 capsule (1 g total) by mouth 2 (two) times daily.   omeprazole (PRILOSEC) 20 MG capsule Take 1 capsule (20 mg total) by mouth daily as needed (for acid reflux). Acid reflux.   ONETOUCH DELICA LANCETS 41D MISC EVERY DAY   pramipexole (MIRAPEX) 0.75 MG tablet Take 1 tablet (0.75 mg total) by mouth 3 (three) times daily.   Probiotic Product (PROBIOTIC PO) Take 1 capsule by mouth daily.   sildenafil (REVATIO) 20 MG tablet Take 1 tablet (20 mg total) by mouth 3 (three) times daily.   No facility-administered encounter medications on file as of 04/11/2021.    Past Surgical History:  Procedure Laterality Date   ANTERIOR CERVICAL DECOMP/DISCECTOMY FUSION N/A 09/22/2012   Procedure: ANTERIOR CERVICAL DECOMPRESSION/DISCECTOMY FUSION 2 LEVELS;  Surgeon: Floyce Stakes, MD;  Location: [facility];  Service: Neurosurgery;  Laterality: N/A;  Cervical five-six Cervical six-seven Anterior cervical decompression/diskectomy/fusion   ATRIAL FLUTTER ABLATION  12/23/2012   CTI ablation by Dr Rayann Heman   ATRIAL FLUTTER ABLATION N/A 12/23/2012   Procedure: ATRIAL FLUTTER ABLATION;  Surgeon: Thompson Grayer, MD;  Location: [facility] CATH LAB;  Service: Cardiovascular;  Laterality: N/A;   CARDIAC CATHETERIZATION N/A 11/16/2015   Procedure: Left Heart Cath and Coronary Angiography;  Surgeon: Leonie Man, MD;  Location: [facility] LAB;  Service: Cardiovascular;  Laterality: N/A;   CARDIOVERSION N/A 09/23/2012   Procedure: CARDIOVERSION;  Surgeon: Renella Cunas, MD;  Location: [facility] OR;  Service: Cardiovascular;  Laterality: N/A;   TONSILLECTOMY  1973   TREATMENT FISTULA ANAL   ~ 2007   WISDOM TOOTH EXTRACTION      Family History  Problem Relation Age of Onset   Lung cancer Mother        died @ 53   Brain cancer Father        died @ 59   Diabetes Maternal Uncle    Diabetes Maternal Grandfather     New complaints: None today  Social history: Lives with wife and on  Controlled substance contract: n/a     Review of Systems  Constitutional:  Negative for diaphoresis.  Eyes:  Negative for pain.  Respiratory:  Negative for shortness  of breath.   Cardiovascular:  Negative for chest pain, palpitations and leg swelling.  Gastrointestinal:  Negative for abdominal pain.  Endocrine: Negative for polydipsia.  Skin:  Negative for rash.  Neurological:  Negative for dizziness, weakness and headaches.  Hematological:  Does not bruise/bleed easily.  All other systems reviewed and are negative.     Objective:   Physical Exam Vitals and nursing note reviewed.  Constitutional:      Appearance: Normal appearance. He is well-developed.  HENT:     Head: Normocephalic.     Nose: Nose normal.  Eyes:     Pupils: Pupils are equal, round, and reactive to light.  Neck:     Thyroid: No thyroid mass or thyromegaly.     Vascular: No carotid bruit or JVD.     Trachea: Phonation normal.  Cardiovascular:     Rate and Rhythm: Normal rate and regular rhythm.  Pulmonary:     Effort: Pulmonary effort is normal. No respiratory distress.     Breath sounds: Normal breath sounds.  Abdominal:     General: Bowel sounds are normal.     Palpations: Abdomen is soft.     Tenderness: There is no abdominal tenderness.  Musculoskeletal:        General: Normal range of motion.     Cervical back: Normal range of motion and neck supple.  Lymphadenopathy:     Cervical: No cervical adenopathy.  Skin:    General: Skin is warm and dry.  Neurological:     Mental Status: He is alert and oriented to person, place, and time.  Psychiatric:        Behavior: Behavior normal.         Thought Content: Thought content normal.        Judgment: Judgment normal.   BP 135/81   Pulse 73   Temp 98.6 F (37 C) (Temporal)   Resp 20   Ht $R'5\' 10"'kM$  (1.778 m)   Wt 222 lb (100.7 kg)   SpO2 96%   BMI 31.85 kg/m   HGBA1c 7.7%       Assessment & Plan:  Benjamin Hale comes in today with chief complaint of Medical Management of Chronic Issues   Diagnosis and orders addressed:  1. Essential hypertension Low sodium diet - CBC with Differential/Platelet - CMP14+EGFR - lisinopril (ZESTRIL) 40 MG tablet; Take 1 tablet (40 mg total) by mouth daily.  Dispense: 90 tablet; Refill: 1 - metoprolol tartrate (LOPRESSOR) 25 MG tablet; Take 1 tablet (25 mg total) by mouth 2 (two) times daily.  Dispense: 180 tablet; Refill: 1 - amLODipine (NORVASC) 10 MG tablet; Take 1 tablet (10 mg total) by mouth daily.  Dispense: 90 tablet; Refill: 1  2. Hyperlipidemia with target LDL less than 100 Low fat diet - Lipid panel - fenofibrate (TRICOR) 145 MG tablet; Take 1 tablet (145 mg total) by mouth daily.  Dispense: 90 tablet; Refill: 1 - omega-3 acid ethyl esters (LOVAZA) 1 g capsule; Take 1 capsule (1 g total) by mouth 2 (two) times daily.  Dispense: 180 capsule; Refill: 1  3. Controlled type 2 diabetes mellitus with diabetic nephropathy, without long-term current use of insulin (HCC) Stricter carb counting - Bayer DCA Hb A1c Waived - glimepiride (AMARYL) 4 MG tablet; Take 1 tablet (4 mg total) by mouth daily before breakfast.  Dispense: 90 tablet; Refill: 1 - metFORMIN (GLUCOPHAGE) 1000 MG tablet; Take 1 tablet (1,000 mg total) by mouth 2 (two) times daily with a meal.  Dispense: 180 tablet; Refill: 1  4. Gastroesophageal reflux disease without esophagitis Avoid spicy foods Do not eat 2 hours prior to bedtime - omeprazole (PRILOSEC) 20 MG capsule; Take 1 capsule (20 mg total) by mouth daily as needed (for acid reflux). Acid reflux.  Dispense: 90 capsule; Refill: 1  5. Depression, major,  single episode, moderate (HCC) Stress management - escitalopram (LEXAPRO) 10 MG tablet; TAKE ONE (1) TABLET EACH DAY  Dispense: 90 tablet; Refill: 1  6. Restless leg syndrome Keep legs warm at night - pramipexole (MIRAPEX) 0.75 MG tablet; Take 1 tablet (0.75 mg total) by mouth 3 (three) times daily.  Dispense: 90 tablet; Refill: 1  7. Cervical neuropathic pain Keep appt with fneuro    Labs pending Health Maintenance reviewed Diet and exercise encouraged  Follow up plan: 3 months   [place], [name]

## 2021-04-11 NOTE — Patient Instructions (Signed)
Carbohydrate Counting for Diabetes Mellitus, Adult Carbohydrate counting is a method of keeping track of how many carbohydrates you eat. Eating carbohydrates naturally increases the amount of sugar (glucose) in the blood. Counting how many carbohydrates you eat improves your blood glucose control, which helps you manage your diabetes. It is important to know how many carbohydrates you can safely have in each meal. This is different for every person. A dietitian can help you make a meal plan and calculate how many carbohydrates you should have at each meal and snack. What foods contain carbohydrates? Carbohydrates are found in the following foods: Grains, such as breads and cereals. Dried beans and soy products. Starchy vegetables, such as potatoes, peas, and corn. Fruit and fruit juices. Milk and yogurt. Sweets and snack foods, such as cake, cookies, candy, chips, and soft drinks. How do I count carbohydrates in foods? There are two ways to count carbohydrates in food. You can read food labels or learn standard serving sizes of foods. You can use either of the methods or a combination of both. Using the Nutrition Facts label The Nutrition Facts list is included on the labels of almost all packaged foods and beverages in the [place] It includes: The serving size. Information about nutrients in each serving, including the grams (g) of carbohydrate per serving. To use the Nutrition Facts: Decide how many servings you will have. Multiply the number of servings by the number of carbohydrates per serving. The resulting number is the total amount of carbohydrates that you will be having. Learning the standard serving sizes of foods When you eat carbohydrate foods that are not packaged or do not include Nutrition Facts on the label, you need to measure the servings in order to count the amount of carbohydrates. Measure the foods that you will eat with a food scale or measuring cup, if needed. Decide how  many standard-size servings you will eat. Multiply the number of servings by 15. For foods that contain carbohydrates, one serving equals 15 g of carbohydrates. For example, if you eat 2 cups or 10 oz (300 g) of strawberries, you will have eaten 2 servings and 30 g of carbohydrates (2 servings x 15 g = 30 g). For foods that have more than one food mixed, such as soups and casseroles, you must count the carbohydrates in each food that is included. The following list contains standard serving sizes of common carbohydrate-rich foods. Each of these servings has about 15 g of carbohydrates: 1 slice of bread. 1 six-inch (15 cm) tortilla. ? cup or 2 oz (53 g) cooked rice or pasta.  cup or 3 oz (85 g) cooked or canned, drained and rinsed beans or lentils.  cup or 3 oz (85 g) starchy vegetable, such as peas, corn, or squash.  cup or 4 oz (120 g) hot cereal.  cup or 3 oz (85 g) boiled or mashed potatoes, or  or 3 oz (85 g) of a large baked potato.  cup or 4 fl oz (118 mL) fruit juice. 1 cup or 8 fl oz (237 mL) milk. 1 small or 4 oz (106 g) apple.  or 2 oz (63 g) of a medium banana. 1 cup or 5 oz (150 g) strawberries. 3 cups or 1 oz (24 g) popped popcorn. What is an example of carbohydrate counting? To calculate the number of carbohydrates in this sample meal, follow the steps shown below. Sample meal 3 oz (85 g) chicken breast. ? cup or 4 oz (106 g) brown   rice.  cup or 3 oz (85 g) corn. 1 cup or 8 fl oz (237 mL) milk. 1 cup or 5 oz (150 g) strawberries with sugar-free whipped topping. Carbohydrate calculation Identify the foods that contain carbohydrates: Rice. Corn. Milk. Strawberries. Calculate how many servings you have of each food: 2 servings rice. 1 serving corn. 1 serving milk. 1 serving strawberries. Multiply each number of servings by 15 g: 2 servings rice x 15 g = 30 g. 1 serving corn x 15 g = 15 g. 1 serving milk x 15 g = 15 g. 1 serving strawberries x 15 g = 15  g. Add together all of the amounts to find the total grams of carbohydrates eaten: 30 g + 15 g + 15 g + 15 g = 75 g of carbohydrates total. What are tips for following this plan? Shopping Develop a meal plan and then make a shopping list. Buy fresh and frozen vegetables, fresh and frozen fruit, dairy, eggs, beans, lentils, and whole grains. Look at food labels. Choose foods that have more fiber and less sugar. Avoid processed foods and foods with added sugars. Meal planning Aim to have the same amount of carbohydrates at each meal and for each snack time. Plan to have regular, balanced meals and snacks. Where to find more information American Diabetes Association: www.diabetes.org Centers for Disease Control and Prevention: www.cdc.gov Summary Carbohydrate counting is a method of keeping track of how many carbohydrates you eat. Eating carbohydrates naturally increases the amount of sugar (glucose) in the blood. Counting how many carbohydrates you eat improves your blood glucose control, which helps you manage your diabetes. A dietitian can help you make a meal plan and calculate how many carbohydrates you should have at each meal and snack. This information is not intended to replace advice given to you by your health care provider. Make sure you discuss any questions you have with your health care provider. Document Revised: 06/02/2019 Document Reviewed: 06/03/2019 Elsevier Patient Education  2021 Elsevier Inc.  

## 2021-04-24 ENCOUNTER — Telehealth: Payer: Self-pay | Admitting: Nurse Practitioner

## 2021-04-24 DIAGNOSIS — E785 Hyperlipidemia, unspecified: Secondary | ICD-10-CM

## 2021-04-24 DIAGNOSIS — I10 Essential (primary) hypertension: Secondary | ICD-10-CM

## 2021-04-24 DIAGNOSIS — K219 Gastro-esophageal reflux disease without esophagitis: Secondary | ICD-10-CM

## 2021-04-24 DIAGNOSIS — G2581 Restless legs syndrome: Secondary | ICD-10-CM

## 2021-04-24 DIAGNOSIS — F321 Major depressive disorder, single episode, moderate: Secondary | ICD-10-CM

## 2021-04-24 DIAGNOSIS — E1121 Type 2 diabetes mellitus with diabetic nephropathy: Secondary | ICD-10-CM

## 2021-04-24 MED ORDER — AMLODIPINE BESYLATE 10 MG PO TABS
10.0000 mg | ORAL_TABLET | Freq: Every day | ORAL | 1 refills | Status: DC
Start: 2021-04-24 — End: 2021-07-18

## 2021-04-24 MED ORDER — PRAMIPEXOLE DIHYDROCHLORIDE 0.75 MG PO TABS: 0.7500 mg | ORAL_TABLET | Freq: Three times a day (TID) | ORAL | 1 refills | Status: DC

## 2021-04-24 MED ORDER — METOPROLOL TARTRATE 25 MG PO TABS: 25.0000 mg | ORAL_TABLET | Freq: Two times a day (BID) | ORAL | 1 refills | Status: DC

## 2021-04-24 MED ORDER — ESCITALOPRAM OXALATE 10 MG PO TABS: ORAL_TABLET | ORAL | 1 refills | Status: DC

## 2021-04-24 MED ORDER — GLIMEPIRIDE 4 MG PO TABS: 4.0000 mg | ORAL_TABLET | Freq: Every day | ORAL | 1 refills | Status: DC

## 2021-04-24 MED ORDER — OMEPRAZOLE 20 MG PO CPDR: 20.0000 mg | DELAYED_RELEASE_CAPSULE | Freq: Every day | ORAL | 1 refills | Status: DC | PRN

## 2021-04-24 MED ORDER — METFORMIN HCL 1000 MG PO TABS: 1000.0000 mg | ORAL_TABLET | Freq: Two times a day (BID) | ORAL | 1 refills | Status: DC

## 2021-04-24 MED ORDER — OMEGA-3-ACID ETHYL ESTERS 1 G PO CAPS: 1.0000 | ORAL_CAPSULE | Freq: Two times a day (BID) | ORAL | 1 refills | Status: DC

## 2021-04-24 MED ORDER — FENOFIBRATE 145 MG PO TABS: 145.0000 mg | ORAL_TABLET | Freq: Every day | ORAL | 1 refills | Status: DC

## 2021-04-24 MED ORDER — LISINOPRIL 40 MG PO TABS
40.0000 mg | ORAL_TABLET | Freq: Every day | ORAL | 1 refills | Status: DC
Start: 2021-04-24 — End: 2021-07-18

## 2021-04-24 NOTE — Telephone Encounter (Signed)
I called [facility] & pt aware that all 10 meds are being filled at 90 d supply Talked about the [place] and that in most cases pt's had to be on insulin, but with his insurance that may not be the case. He does have coworkers that have it and he is going to check with them as well.

## 2021-04-24 NOTE — Telephone Encounter (Signed)
  Prescription Request  04/24/2021  Is this a "Controlled Substance" medicine? no  Have you seen your PCP in the last 2 weeks? yes  If YES, route message to pool  -  If NO, patient needs to be scheduled for appointment.  What is the name of the medication or equipment? Metformin, amlodipine, lexapro, fenofibrate, lisinopril, amaryl, lopressor, lovaza, prilosec, mirapex  Have you contacted your pharmacy to request a refill? Yes, it was sent to wrong pharmacy   Which pharmacy would you like this sent to? [facility] - [place], [place] - [address]   Patient notified that their request is being sent to the clinical staff for review and that they should receive a response within 2 business days.

## 2021-04-24 NOTE — Telephone Encounter (Signed)
Pt aware all meds sent to [facility]

## 2021-05-01 LAB — HM DIABETES EYE EXAM

## 2021-05-06 DIAGNOSIS — S129XXA Fracture of neck, unspecified, initial encounter: Secondary | ICD-10-CM | POA: Diagnosis not present

## 2021-05-06 DIAGNOSIS — I10 Essential (primary) hypertension: Secondary | ICD-10-CM | POA: Diagnosis not present

## 2021-05-06 DIAGNOSIS — Z6832 Body mass index (BMI) 32.0-32.9, adult: Secondary | ICD-10-CM | POA: Diagnosis not present

## 2021-05-08 ENCOUNTER — Other Ambulatory Visit: Payer: Self-pay | Admitting: Neurosurgery

## 2021-05-08 DIAGNOSIS — S129XXA Fracture of neck, unspecified, initial encounter: Secondary | ICD-10-CM

## 2021-05-21 ENCOUNTER — Ambulatory Visit
Admission: RE | Admit: 2021-05-21 | Discharge: 2021-05-21 | Disposition: A | Discharge status: Home / Self Care | Payer: BC Managed Care – PPO | Source: Ambulatory Visit | Attending: Neurosurgery | Admitting: Neurosurgery

## 2021-05-21 DIAGNOSIS — S129XXA Fracture of neck, unspecified, initial encounter: Secondary | ICD-10-CM

## 2021-05-21 DIAGNOSIS — R200 Anesthesia of skin: Secondary | ICD-10-CM | POA: Diagnosis not present

## 2021-05-21 DIAGNOSIS — R202 Paresthesia of skin: Secondary | ICD-10-CM | POA: Diagnosis not present

## 2021-05-21 DIAGNOSIS — M542 Cervicalgia: Secondary | ICD-10-CM | POA: Diagnosis not present

## 2021-05-21 DIAGNOSIS — M2578 Osteophyte, vertebrae: Secondary | ICD-10-CM | POA: Diagnosis not present

## 2021-05-27 DIAGNOSIS — Z6832 Body mass index (BMI) 32.0-32.9, adult: Secondary | ICD-10-CM | POA: Diagnosis not present

## 2021-05-27 DIAGNOSIS — S129XXA Fracture of neck, unspecified, initial encounter: Secondary | ICD-10-CM | POA: Diagnosis not present

## 2021-05-27 DIAGNOSIS — M5412 Radiculopathy, cervical region: Secondary | ICD-10-CM | POA: Diagnosis not present

## 2021-05-27 DIAGNOSIS — I10 Essential (primary) hypertension: Secondary | ICD-10-CM | POA: Diagnosis not present

## 2021-05-28 ENCOUNTER — Other Ambulatory Visit: Payer: BC Managed Care – PPO

## 2021-06-05 DIAGNOSIS — M2578 Osteophyte, vertebrae: Secondary | ICD-10-CM | POA: Diagnosis not present

## 2021-06-05 DIAGNOSIS — M5412 Radiculopathy, cervical region: Secondary | ICD-10-CM | POA: Diagnosis not present

## 2021-06-26 DIAGNOSIS — I10 Essential (primary) hypertension: Secondary | ICD-10-CM | POA: Diagnosis not present

## 2021-06-26 DIAGNOSIS — Z6832 Body mass index (BMI) 32.0-32.9, adult: Secondary | ICD-10-CM | POA: Diagnosis not present

## 2021-06-26 DIAGNOSIS — S129XXD Fracture of neck, unspecified, subsequent encounter: Secondary | ICD-10-CM | POA: Diagnosis not present

## 2021-07-02 ENCOUNTER — Other Ambulatory Visit: Payer: Self-pay | Admitting: Neurosurgery

## 2021-07-15 NOTE — Progress Notes (Signed)
Surgical Instructions    Your procedure is scheduled on Friday February 3rd.  Report to [facility] Main Entrance "A" at 5:30 A.M., then check in with the Admitting office.  Call this number if you have problems the morning of surgery:  [phone]   If you have any questions prior to your surgery date call [phone]: Open Monday-Friday 8am-4pm    Remember:  Do not eat or drink after midnight the night before your surgery     Take these medicines the morning of surgery with A SIP OF WATER amLODipine (NORVASC) 10 MG tablet escitalopram (LEXAPRO) 10 MG tablet fenofibrate (TRICOR) 145 MG tablet gabapentin (NEURONTIN) 300 MG capsule metoprolol tartrate (LOPRESSOR) 25 MG tablet omeprazole (PRILOSEC) 20 MG capsule   IF NEEDED  albuterol (VENTOLIN HFA) 108 (90 Base) MCG/ACT inhaler - please bring with you to the hospital diazepam (VALIUM) 5 MG tablet fluticasone (FLONASE) 50 MCG/ACT nasal spray nitroGLYCERIN (NITROSTAT) 0.4 MG SL tablet pramipexole (MIRAPEX) 0.75 MG tablet   Follow your surgeon's instructions on when to stop Aspirin.  If no instructions were given by your surgeon then you will need to call the office to get those instructions.     As of today, STOP taking any Aspirin (unless otherwise instructed by your surgeon) Aleve, Naproxen, Ibuprofen, Motrin, Advil, Goody's, BC's, all herbal medications, fish oil, and all vitamins.   WHAT DO I DO ABOUT MY DIABETES MEDICATION?   Do not take oral diabetes medicines Glimepiride, Metformin the morning of surgery.  Do not take your Glimepiride/Amaryl the day prior to surgery  HOW TO MANAGE YOUR DIABETES BEFORE AND AFTER SURGERY  Why is it important to control my blood sugar before and after surgery? Improving blood sugar levels before and after surgery helps healing and can limit problems. A way of improving blood sugar control is eating a healthy diet by:  Eating less sugar and carbohydrates  Increasing  activity/exercise  Talking with your doctor about reaching your blood sugar goals High blood sugars (greater than 180 mg/dL) can raise your risk of infections and slow your recovery, so you will need to focus on controlling your diabetes during the weeks before surgery. Make sure that the doctor who takes care of your diabetes knows about your planned surgery including the date and location.  How do I manage my blood sugar before surgery? Check your blood sugar at least 4 times a day, starting 2 days before surgery, to make sure that the level is not too high or low.  Check your blood sugar the morning of your surgery when you wake up and every 2 hours until you get to the Short Stay unit.  If your blood sugar is less than 70 mg/dL, you will need to treat for low blood sugar: Do not take insulin. Treat a low blood sugar (less than 70 mg/dL) with  cup of clear juice (cranberry or apple), 4 glucose tablets, OR glucose gel. Recheck blood sugar in 15 minutes after treatment (to make sure it is greater than 70 mg/dL). If your blood sugar is not greater than 70 mg/dL on recheck, call [phone] for further instructions. Report your blood sugar to the short stay nurse when you get to Short Stay.  If you are admitted to the hospital after surgery: Your blood sugar will be checked by the staff and you will probably be given insulin after surgery (instead of oral diabetes medicines) to make sure you have good blood sugar levels. The goal for blood sugar control  after surgery is 80-180 mg/dL.     After your COVID test   You are not required to quarantine however you are required to wear a well-fitting mask when you are out and around people not in your household.  If your mask becomes wet or soiled, replace with a new one.  Wash your hands often with soap and water for 20 seconds or clean your hands with an alcohol-based hand sanitizer that contains at least 60% alcohol.  Do not share personal  items.  Notify your provider: if you are in close contact with someone who has COVID  or if you develop a fever of 100.4 or greater, sneezing, cough, sore throat, shortness of breath or body aches.           Do not wear jewelry  Do not wear lotions, powders, cologne or deodorant. Do not shave 48 hours prior to surgery.  Men may shave face and neck. Do not bring valuables to the hospital. Do not wear nail polish, gel polish, artificial nails, or any other type of covering on natural nails (fingers and toes) If you have artificial nails or gel coating that need to be removed by a nail salon, please have this removed prior to surgery. Artificial nails or gel coating may interfere with anesthesia's ability to adequately monitor your vital signs.             [facility] is not responsible for any belongings or valuables.  Do NOT Smoke (Tobacco/Vaping)  24 hours prior to your procedure  If you use a CPAP at night, you may bring your mask for your overnight stay.   Contacts, glasses, hearing aids, dentures or partials may not be worn into surgery, please bring cases for these belongings   For patients admitted to the hospital, discharge time will be determined by your treatment team.   Patients discharged the day of surgery will not be allowed to drive home, and someone needs to stay with them for 24 hours.  NO VISITORS WILL BE ALLOWED IN PRE-OP WHERE PATIENTS ARE PREPPED FOR SURGERY.  ONLY 1 SUPPORT PERSON MAY BE PRESENT IN THE WAITING ROOM WHILE YOU ARE IN SURGERY.  IF YOU ARE TO BE ADMITTED, ONCE YOU ARE IN YOUR ROOM YOU WILL BE ALLOWED TWO (2) VISITORS. 1 (ONE) VISITOR MAY STAY OVERNIGHT BUT MUST ARRIVE TO THE ROOM BY 8pm.  Minor children may have two parents present. Special consideration for safety and communication needs will be reviewed on a case by case basis.  Special instructions:    Oral Hygiene is also important to reduce your risk of infection.  Remember - BRUSH YOUR TEETH THE  MORNING OF SURGERY WITH YOUR REGULAR TOOTHPASTE   [facility]- Preparing For Surgery  Before surgery, you can play an important role. Because skin is not sterile, your skin needs to be as free of germs as possible. You can reduce the number of germs on your skin by washing with CHG (chlorahexidine gluconate) Soap before surgery.  CHG is an antiseptic cleaner which kills germs and bonds with the skin to continue killing germs even after washing.     Please do not use if you have an allergy to CHG or antibacterial soaps. If your skin becomes reddened/irritated stop using the CHG.  Do not shave (including legs and underarms) for at least 48 hours prior to first CHG shower. It is OK to shave your face.  Please follow these instructions carefully.     Shower  the NIGHT BEFORE SURGERY and the MORNING OF SURGERY with CHG Soap.   If you chose to wash your hair, wash your hair first as usual with your normal shampoo. After you shampoo, rinse your hair and body thoroughly to remove the shampoo.  Then [employer] and genitals (private parts) with your normal soap and rinse thoroughly to remove soap.  After that Use CHG Soap as you would any other liquid soap. You can apply CHG directly to the skin and wash gently with a scrungie or a clean washcloth.   Apply the CHG Soap to your body ONLY FROM THE NECK DOWN.  Do not use on open wounds or open sores. Avoid contact with your eyes, ears, mouth and genitals (private parts). Wash Face and genitals (private parts)  with your normal soap.   Wash thoroughly, paying special attention to the area where your surgery will be performed.  Thoroughly rinse your body with warm water from the neck down.  DO NOT shower/wash with your normal soap after using and rinsing off the CHG Soap.  Pat yourself dry with a CLEAN TOWEL.  Wear CLEAN PAJAMAS to bed the night before surgery  Place CLEAN SHEETS on your bed the night before your surgery  DO NOT SLEEP WITH  PETS.   Day of Surgery:  Take a shower with CHG soap. Wear Clean/Comfortable clothing the morning of surgery Do not apply any deodorants/lotions.   Remember to brush your teeth WITH YOUR REGULAR TOOTHPASTE.   Please read over the following fact sheets that you were given.

## 2021-07-16 ENCOUNTER — Other Ambulatory Visit: Payer: Self-pay

## 2021-07-16 ENCOUNTER — Encounter (HOSPITAL_COMMUNITY): Payer: Self-pay

## 2021-07-16 ENCOUNTER — Encounter (HOSPITAL_COMMUNITY)
Admission: RE | Admit: 2021-07-16 | Discharge: 2021-07-16 | Disposition: A | Discharge status: Home / Self Care | Payer: BC Managed Care – PPO | Source: Ambulatory Visit | Attending: Neurosurgery | Admitting: Neurosurgery

## 2021-07-16 VITALS — BP 146/82 | HR 61 | Temp 98.1°F | Resp 17 | Ht 70.0 in | Wt 221.0 lb

## 2021-07-16 DIAGNOSIS — E1165 Type 2 diabetes mellitus with hyperglycemia: Secondary | ICD-10-CM | POA: Diagnosis not present

## 2021-07-16 DIAGNOSIS — Z20822 Contact with and (suspected) exposure to covid-19: Secondary | ICD-10-CM | POA: Insufficient documentation

## 2021-07-16 DIAGNOSIS — I10 Essential (primary) hypertension: Secondary | ICD-10-CM | POA: Insufficient documentation

## 2021-07-16 DIAGNOSIS — Z01818 Encounter for other preprocedural examination: Secondary | ICD-10-CM | POA: Diagnosis not present

## 2021-07-16 DIAGNOSIS — E1121 Type 2 diabetes mellitus with diabetic nephropathy: Secondary | ICD-10-CM | POA: Diagnosis not present

## 2021-07-16 HISTORY — DX: Cardiac arrhythmia, unspecified: I49.9

## 2021-07-16 LAB — BASIC METABOLIC PANEL
Anion gap: 9 (ref 5–15)
BUN: 14 mg/dL (ref 6–20)
CO2: 24 mmol/L (ref 22–32)
Calcium: 8.8 mg/dL — ABNORMAL LOW (ref 8.9–10.3)
Chloride: 103 mmol/L (ref 98–111)
Creatinine, Ser: 0.97 mg/dL (ref 0.61–1.24)
GFR, Estimated: >60 mL/min (ref >60)
Glucose, Bld: 322 mg/dL — ABNORMAL HIGH (ref 70–99)
Potassium: 3.9 mmol/L (ref 3.5–5.1)
Sodium: 136 mmol/L (ref 135–145)

## 2021-07-16 LAB — CBC
HCT: 41 % (ref 39.0–52.0)
Hemoglobin: 15.3 g/dL (ref 13.0–17.0)
MCH: 30.9 pg (ref 26.0–34.0)
MCHC: 37.3 g/dL — ABNORMAL HIGH (ref 30.0–36.0)
MCV: 82.8 fL (ref 80.0–100.0)
Platelets: 224 10*3/uL (ref 150–400)
RBC: 4.95 MIL/uL (ref 4.22–5.81)
RDW: 12 % (ref 11.5–15.5)
WBC: 7.7 10*3/uL (ref 4.0–10.5)
nRBC: 0 % (ref 0.0–0.2)

## 2021-07-16 LAB — GLUCOSE, CAPILLARY: Glucose-Capillary: 372 mg/dL — ABNORMAL HIGH (ref 70–99)

## 2021-07-16 LAB — HEMOGLOBIN A1C
Hgb A1c MFr Bld: 8.3 % — ABNORMAL HIGH (ref 4.8–5.6)
Mean Plasma Glucose: 191.51 mg/dL

## 2021-07-16 LAB — TYPE AND SCREEN
ABO/RH(D): A NEG
Antibody Screen: NEGATIVE

## 2021-07-16 LAB — SARS CORONAVIRUS 2 (TAT 6-24 HRS): SARS Coronavirus 2: NEGATIVE

## 2021-07-16 LAB — SURGICAL PCR SCREEN
MRSA, PCR: NEGATIVE
Staphylococcus aureus: POSITIVE — AB

## 2021-07-16 NOTE — Progress Notes (Signed)
PCP - [name] Cardiologist - Jens Som, Madolyn Frieze, MD (Had an ablation done years ago)  Chest x-ray - Not indicated EKG - 07/16/21 Stress Test - 11/06/15 ECHO - 11/06/15 Cardiac Cath -11/16/15   Sleep Study - Denies  DM - Type II Fasting Blood Sugar - Averaging more than 200 Checks Blood Sugar __3___ times a week  Aspirin Instructions:INstructed to stop. Last dose Thursday 26th  COVID TEST- 07/16/21   Anesthesia review: Yes cardiac history  Patient denies shortness of breath, fever, cough and chest pain at PAT appointment   All instructions explained to the patient, with a verbal understanding of the material. Patient agrees to go over the instructions while at home for a better understanding. Patient also instructed to wear a mask while in public after being tested for COVID-19. The opportunity to ask questions was provided.

## 2021-07-17 NOTE — Progress Notes (Signed)
Anesthesia Chart Review:  Patient previously followed by cardiology for history of a flutter s/p successful ablation.  He developed AFlutter with RVR after ACDF in 2014. He was rate controlled with IV diltiazem. He did not convert on his own and underwent DCCV within 24 hours of onset. Patient had recurrent atrial flutter and underwent ablation in July 2014. Cardiac catheterization June 2017 showed normal coronary arteries. Monitor April 2018 showed sinus with PACs.  Patient was last seen by Dr. Jens Som on 02/19/2017.  At that time he was noted to be having occasional palpitations otherwise doing well.  He has been followed by PCP since that time for management of all chronic medical conditions.  Preop labs reviewed, noninsulin-dependent DM2 poorly controlled with A1c 8.3, labs otherwise unremarkable.  EKG 07/16/2021: Normal sinus rhythm.  Rate 63. Nonspecific T wave abnormality  Cath 11/16/2015: Angiographically normal coronary arteries with a right dominant system. The left ventricular systolic function is normal.   No culprit lesion found to explain the patient's symptoms. Would suspect false-positive stress test.   TTE 11/06/2015: - Left ventricle: The cavity size was normal. Systolic function was    normal. The estimated ejection fraction was in the range of 55%    to 60%. Wall motion was normal; there were no regional wall    motion abnormalities. Left ventricular diastolic function    parameters were normal.     [name] [facility] Short Stay Center/Anesthesiology Phone [phone] 07/17/2021 1:17 PM

## 2021-07-17 NOTE — Anesthesia Preprocedure Evaluation (Addendum)
Anesthesia Evaluation  Patient identified by MRN, date of birth, ID band Patient awake    Reviewed: Allergy & Precautions, NPO status , Patient's Chart, lab work & pertinent test results  Airway Mallampati: II  TM Distance: >3 FB Neck ROM: Full    Dental no notable dental hx. (+) Teeth Intact, Dental Advisory Given   Pulmonary former smoker,    Pulmonary exam normal breath sounds clear to auscultation       Cardiovascular hypertension, Pt. on home beta blockers and Pt. on medications Normal cardiovascular exam Rhythm:Regular Rate:Normal  Pt s/P Afutter ablatio FE 50-55%   Neuro/Psych PSYCHIATRIC DISORDERS Depression    GI/Hepatic Neg liver ROS, GERD  Medicated and Controlled,  Endo/Other  diabetes, Type 2  Renal/GU negative Renal ROSLab Results      Component                Value               Date                      CREATININE               0.97                07/16/2021                K                        3.9                 07/16/2021                     Musculoskeletal  (+) Arthritis ,   Abdominal (+) + obese (BMi 32.1),   Peds  Hematology Lab Results      Component                Value               Date                                  HGB                      15.3                07/16/2021                   Anesthesia Other Findings   Reproductive/Obstetrics                           Anesthesia Physical Anesthesia Plan  ASA: 2  Anesthesia Plan: General   Post-op Pain Management: Dilaudid IV   Induction: Intravenous  PONV Risk Score and Plan: 3 and Treatment may vary due to age or medical condition, Midazolam, Ondansetron and Dexamethasone  Airway Management Planned: Video Laryngoscope Planned and Oral ETT  Additional Equipment: None  Intra-op Plan:   Post-operative Plan: Extubation in OR  Informed Consent: I have reviewed the patients History and Physical,  chart, labs and discussed the procedure including the risks, benefits and alternatives for the proposed anesthesia with the patient or authorized representative who has indicated his/her understanding and acceptance.     Dental advisory given  Plan Discussed with:  Anesthesia Plan Comments: (PAT note by Karoline Caldwell, PA-C:  Patient previously followed by cardiology for history of a flutter s/p successful ablation.  He developed AFlutter with RVR after ACDF in 2014. He was rate controlled with IV diltiazem. He did not convert on his own and underwent DCCV within 24 hours of onset. Patient had recurrent atrial flutterand underwent ablation in July 2014.Cardiac catheterization June 2017 showed normal coronary arteries. Monitor April 2018 showed sinus with PACs.  Patient was last seen by Dr. Stanford Breed on 02/19/2017.  At that time he was noted to be having occasional palpitations otherwise doing well.  He has been followed by PCP since that time for management of all chronic medical conditions.  Preop labs reviewed, noninsulin-dependent DM2 poorly controlled with A1c 8.3, labs otherwise unremarkable.  EKG 07/16/2021: Normal sinus rhythm.  Rate 63. Nonspecific T wave abnormality  Cath 11/16/2015:  Angiographically normal coronary arteries with a right dominant system.  The left ventricular systolic function is normal.  No culprit lesion found to explain the patient's symptoms. Would suspect false-positive stress test.  TTE 11/06/2015: - Left ventricle: The cavity size was normal. Systolic function was  normal. The estimated ejection fraction was in the range of 55%  to 60%. Wall motion was normal; there were no regional wall  motion abnormalities. Left ventricular diastolic function  parameters were normal.  )      Anesthesia Quick Evaluation

## 2021-07-18 ENCOUNTER — Other Ambulatory Visit: Payer: Self-pay

## 2021-07-18 ENCOUNTER — Ambulatory Visit: Payer: BC Managed Care – PPO | Admitting: Nurse Practitioner

## 2021-07-18 ENCOUNTER — Encounter: Payer: Self-pay | Admitting: Nurse Practitioner

## 2021-07-18 VITALS — BP 132/78 | HR 59 | Temp 98.3°F | Resp 20 | Ht 70.0 in | Wt 224.0 lb

## 2021-07-18 DIAGNOSIS — F321 Major depressive disorder, single episode, moderate: Secondary | ICD-10-CM

## 2021-07-18 DIAGNOSIS — E1121 Type 2 diabetes mellitus with diabetic nephropathy: Secondary | ICD-10-CM

## 2021-07-18 DIAGNOSIS — E785 Hyperlipidemia, unspecified: Secondary | ICD-10-CM | POA: Diagnosis not present

## 2021-07-18 DIAGNOSIS — Z1212 Encounter for screening for malignant neoplasm of rectum: Secondary | ICD-10-CM

## 2021-07-18 DIAGNOSIS — B0229 Other postherpetic nervous system involvement: Secondary | ICD-10-CM

## 2021-07-18 DIAGNOSIS — K219 Gastro-esophageal reflux disease without esophagitis: Secondary | ICD-10-CM | POA: Diagnosis not present

## 2021-07-18 DIAGNOSIS — I10 Essential (primary) hypertension: Secondary | ICD-10-CM | POA: Diagnosis not present

## 2021-07-18 DIAGNOSIS — G2581 Restless legs syndrome: Secondary | ICD-10-CM

## 2021-07-18 DIAGNOSIS — M5412 Radiculopathy, cervical region: Secondary | ICD-10-CM

## 2021-07-18 DIAGNOSIS — Z1211 Encounter for screening for malignant neoplasm of colon: Secondary | ICD-10-CM

## 2021-07-18 MED ORDER — AMLODIPINE BESYLATE 10 MG PO TABS: 10.0000 mg | ORAL_TABLET | Freq: Every day | ORAL | 1 refills | Status: DC

## 2021-07-18 MED ORDER — PRAMIPEXOLE DIHYDROCHLORIDE 0.75 MG PO TABS: 0.7500 mg | ORAL_TABLET | Freq: Every day | ORAL | 1 refills | Status: DC

## 2021-07-18 MED ORDER — GLIMEPIRIDE 4 MG PO TABS: 4.0000 mg | ORAL_TABLET | Freq: Every day | ORAL | 1 refills | Status: DC

## 2021-07-18 MED ORDER — GABAPENTIN 300 MG PO CAPS: 300.0000 mg | ORAL_CAPSULE | Freq: Two times a day (BID) | ORAL | 0 refills | Status: DC

## 2021-07-18 MED ORDER — OMEPRAZOLE 20 MG PO CPDR: 20.0000 mg | DELAYED_RELEASE_CAPSULE | Freq: Every day | ORAL | 1 refills | Status: DC | PRN

## 2021-07-18 MED ORDER — LISINOPRIL 40 MG PO TABS: 40.0000 mg | ORAL_TABLET | Freq: Every day | ORAL | 1 refills | Status: DC

## 2021-07-18 MED ORDER — DIAZEPAM 5 MG PO TABS: ORAL_TABLET | ORAL | 2 refills | Status: DC

## 2021-07-18 MED ORDER — METFORMIN HCL 1000 MG PO TABS: 1000.0000 mg | ORAL_TABLET | Freq: Two times a day (BID) | ORAL | 1 refills | Status: DC

## 2021-07-18 MED ORDER — OMEGA-3-ACID ETHYL ESTERS 1 G PO CAPS: 1.0000 | ORAL_CAPSULE | Freq: Two times a day (BID) | ORAL | 1 refills | Status: DC

## 2021-07-18 MED ORDER — METOPROLOL TARTRATE 25 MG PO TABS: 25.0000 mg | ORAL_TABLET | Freq: Two times a day (BID) | ORAL | 1 refills | Status: DC

## 2021-07-18 MED ORDER — ESCITALOPRAM OXALATE 10 MG PO TABS: ORAL_TABLET | ORAL | 1 refills | Status: DC

## 2021-07-18 MED ORDER — FENOFIBRATE 145 MG PO TABS: 145.0000 mg | ORAL_TABLET | Freq: Every day | ORAL | 1 refills | Status: DC

## 2021-07-18 NOTE — Progress Notes (Signed)
Subjective:    Patient ID: Benjamin Hale, male    DOB: [birth year], 56 y.o.   MRN: [id number]   Chief Complaint: medical management of chronic issues     HPI:  Benjamin Hale is a 56 y.o. who identifies as a male who was assigned male at birth.   Social history: Lives with: wife and son   Comes in today for follow up of the following chronic medical issues:  1. Essential hypertension No c/o chest pain, sob or headache. Does not check blood pressure at home. BP Readings from Last 3 Encounters:  07/16/21 (!) 146/82  04/11/21 135/81  02/11/21 127/75      2. Hyperlipidemia with target LDL less than 100 Doe snot watch diet and does no dedicated exercise. Lab Results  Component Value Date   CHOL 122 04/11/2021   HDL 18 (L) 04/11/2021   LDLCALC 55 04/11/2021   TRIG 316 (H) 04/11/2021   CHOLHDL 6.8 (H) 04/11/2021     3. Controlled type 2 diabetes mellitus with diabetic nephropathy, without long-term current use of insulin (HCC) Does not check his blood sugars very often. Denies any symptoms of low blood sugars. Lab Results  Component Value Date   HGBA1C 8.3 (H) 07/16/2021     4. Gastroesophageal reflux disease without esophagitis Is on omeprazole daily and is doing well.  5. Restless leg syndrome Mirapex is working well  6. Depression, major, single episode, moderate (HCC) Is under a lot of stress right now. His mother n law is sick and his wife Is having to stay with her.    New complaints: Patient is having neck surgery and recently had preop visit where his HGBa1c was 7.7%  No Known Allergies Outpatient Encounter Medications as of 07/18/2021  Medication Sig   albuterol (VENTOLIN HFA) 108 (90 Base) MCG/ACT inhaler Inhale 2 puffs into the lungs every 6 (six) hours as needed for wheezing or shortness of breath.   amLODipine (NORVASC) 10 MG tablet Take 1 tablet (10 mg total) by mouth daily.   Ascorbic Acid (VITAMIN C PO) Take 1 tablet by mouth daily.    aspirin EC 81 MG tablet Take 81 mg by mouth daily.   blood glucose meter kit and supplies Dispense based on patient and insurance preference. Use up to four times daily as directed. (FOR ICD-10 E10.9, E11.9).   diazepam (VALIUM) 5 MG tablet TAKE 1 TABLET EVERY 12 HOURS AS NEEDED FOR ANXIETY   escitalopram (LEXAPRO) 10 MG tablet TAKE ONE (1) TABLET EACH DAY   fenofibrate (TRICOR) 145 MG tablet Take 1 tablet (145 mg total) by mouth daily.   fluticasone (FLONASE) 50 MCG/ACT nasal spray Place 2 sprays into the nose daily as needed for rhinitis or allergies.   gabapentin (NEURONTIN) 300 MG capsule Take 1 capsule (300 mg total) by mouth 2 (two) times daily. (Patient taking differently: Take 300 mg by mouth daily as needed (pain).)   glimepiride (AMARYL) 4 MG tablet Take 1 tablet (4 mg total) by mouth daily before breakfast.   glucose blood (ONETOUCH VERIO) test strip Test BS QID Dx E11.9   ibuprofen (ADVIL,MOTRIN) 200 MG tablet Take 600 mg by mouth every 6 (six) hours as needed (For headache or pain.).   lisinopril (ZESTRIL) 40 MG tablet Take 1 tablet (40 mg total) by mouth daily.   metFORMIN (GLUCOPHAGE) 1000 MG tablet Take 1 tablet (1,000 mg total) by mouth 2 (two) times daily with a meal.   metoprolol tartrate (LOPRESSOR) 25 MG  tablet Take 1 tablet (25 mg total) by mouth 2 (two) times daily.   Multiple Vitamin (MULTIVITAMIN WITH MINERALS) TABS tablet Take 1 tablet by mouth daily.   naproxen (NAPROSYN) 500 MG tablet Take 1 tablet (500 mg total) by mouth 2 (two) times daily with a meal. (Patient not taking: Reported on 07/09/2021)   nitroGLYCERIN (NITROSTAT) 0.4 MG SL tablet Place 1 tablet (0.4 mg total) under the tongue every 5 (five) minutes as needed for chest pain.   omega-3 acid ethyl esters (LOVAZA) 1 g capsule Take 1 capsule (1 g total) by mouth 2 (two) times daily.   omeprazole (PRILOSEC) 20 MG capsule Take 1 capsule (20 mg total) by mouth daily as needed (for acid reflux). Acid reflux.    ONETOUCH DELICA LANCETS 91Y MISC EVERY DAY   pramipexole (MIRAPEX) 0.75 MG tablet Take 1 tablet (0.75 mg total) by mouth 3 (three) times daily. (Patient taking differently: Take 0.75 mg by mouth 3 (three) times daily as needed (restless legs).)   Probiotic Product (PROBIOTIC PO) Take 1 capsule by mouth daily.   sildenafil (REVATIO) 20 MG tablet Take 1 tablet (20 mg total) by mouth 3 (three) times daily.   No facility-administered encounter medications on file as of 07/18/2021.    Past Surgical History:  Procedure Laterality Date   ANTERIOR CERVICAL DECOMP/DISCECTOMY FUSION N/A 09/22/2012   Procedure: ANTERIOR CERVICAL DECOMPRESSION/DISCECTOMY FUSION 2 LEVELS;  Surgeon: Floyce Stakes, MD;  Location: [facility];  Service: Neurosurgery;  Laterality: N/A;  Cervical five-six Cervical six-seven Anterior cervical decompression/diskectomy/fusion   ATRIAL FLUTTER ABLATION  12/23/2012   CTI ablation by Dr Rayann Heman   ATRIAL FLUTTER ABLATION N/A 12/23/2012   Procedure: ATRIAL FLUTTER ABLATION;  Surgeon: Thompson Grayer, MD;  Location: [facility] CATH [facility];  Service: Cardiovascular;  Laterality: N/A;   CARDIAC CATHETERIZATION N/A 11/16/2015   Procedure: Left Heart Cath and Coronary Angiography;  Surgeon: Leonie Man, MD;  Location: [facility] CV [facility];  Service: Cardiovascular;  Laterality: N/A;   CARDIOVERSION N/A 09/23/2012   Procedure: CARDIOVERSION;  Surgeon: Renella Cunas, MD;  Location: [facility];  Service: Cardiovascular;  Laterality: N/A;   TONSILLECTOMY  1973   TREATMENT FISTULA ANAL  ~ 2007   WISDOM TOOTH EXTRACTION      Family History  Problem Relation Age of Onset   Lung cancer Mother        died @ 71   Brain cancer Father        died @ 44   Diabetes Maternal Uncle    Diabetes Maternal Grandfather       Controlled substance contract: N/A     Review of Systems  Constitutional:  Negative for diaphoresis.  Eyes:  Negative for pain.  Respiratory:  Negative for shortness of breath.    Cardiovascular:  Negative for chest pain, palpitations and leg swelling.  Gastrointestinal:  Negative for abdominal pain.  Endocrine: Negative for polydipsia.  Skin:  Negative for rash.  Neurological:  Negative for dizziness, weakness and headaches.  Hematological:  Does not bruise/bleed easily.  All other systems reviewed and are negative.     Objective:   Physical Exam Vitals and nursing note reviewed.  Constitutional:      Appearance: Normal appearance. He is well-developed.  HENT:     Head: Normocephalic.     Nose: Nose normal.     Mouth/Throat:     Mouth: Mucous membranes are moist.     Pharynx: Oropharynx is clear.  Eyes:  Pupils: Pupils are equal, round, and reactive to light.  Neck:     Thyroid: No thyroid mass or thyromegaly.     Vascular: No carotid bruit or JVD.     Trachea: Phonation normal.  Cardiovascular:     Rate and Rhythm: Normal rate and regular rhythm.  Pulmonary:     Effort: Pulmonary effort is normal. No respiratory distress.     Breath sounds: Normal breath sounds.  Abdominal:     General: Bowel sounds are normal.     Palpations: Abdomen is soft.     Tenderness: There is no abdominal tenderness.  Musculoskeletal:        General: Normal range of motion.     Cervical back: Normal range of motion and neck supple.  Lymphadenopathy:     Cervical: No cervical adenopathy.  Skin:    General: Skin is warm and dry.  Neurological:     Mental Status: He is alert and oriented to person, place, and time.  Psychiatric:        Behavior: Behavior normal.        Thought Content: Thought content normal.        Judgment: Judgment normal.    BP 127/78    Pulse (!) 59    Temp 98.3 F (36.8 C) (Temporal)    Resp 20    Ht $R'5\' 10"'qc$  (1.778 m)    Wt 224 lb (101.6 kg)    SpO2 96%    BMI 32.14 kg/m        Assessment & Plan:   Benjamin Hale comes in today with chief complaint of Medical Management of Chronic Issues   Diagnosis and orders addressed:  1.  Essential hypertension Low sodium diet - lisinopril (ZESTRIL) 40 MG tablet; Take 1 tablet (40 mg total) by mouth daily.  Dispense: 90 tablet; Refill: 1 - metoprolol tartrate (LOPRESSOR) 25 MG tablet; Take 1 tablet (25 mg total) by mouth 2 (two) times daily.  Dispense: 180 tablet; Refill: 1 - amLODipine (NORVASC) 10 MG tablet; Take 1 tablet (10 mg total) by mouth daily.  Dispense: 90 tablet; Refill: 1  2. Hyperlipidemia with target LDL less than 100 Low fta diet - fenofibrate (TRICOR) 145 MG tablet; Take 1 tablet (145 mg total) by mouth daily.  Dispense: 90 tablet; Refill: 1 - omega-3 acid ethyl esters (LOVAZA) 1 g capsule; Take 1 capsule (1 g total) by mouth 2 (two) times daily.  Dispense: 180 capsule; Refill: 1  3. Controlled type 2 diabetes mellitus with diabetic nephropathy, without long-term current use of insulin (HCC) Stricter carb counting Rybellsus sample pack- 1 daily - glimepiride (AMARYL) 4 MG tablet; Take 1 tablet (4 mg total) by mouth daily before breakfast.  Dispense: 90 tablet; Refill: 1 - metFORMIN (GLUCOPHAGE) 1000 MG tablet; Take 1 tablet (1,000 mg total) by mouth 2 (two) times daily with a meal.  Dispense: 180 tablet; Refill: 1  4. Gastroesophageal reflux disease without esophagitis Avoid spicy foods Do not eat 2 hours prior to bedtime - omeprazole (PRILOSEC) 20 MG capsule; Take 1 capsule (20 mg total) by mouth daily as needed (for acid reflux). Acid reflux.  Dispense: 90 capsule; Refill: 1  5. Restless leg syndrome Keep legs warm at night - pramipexole (MIRAPEX) 0.75 MG tablet; Take 1 tablet (0.75 mg total) by mouth at bedtime.  Dispense: 90 tablet; Refill: 1  6. Depression, major, single episode, moderate (HCC) Stress management - escitalopram (LEXAPRO) 10 MG tablet; TAKE ONE (1) TABLET EACH DAY  Dispense: 90 tablet; Refill: 1  7. Cervical neuropathic pain Keep appointment for surgery - diazepam (VALIUM) 5 MG tablet; TAKE 1 TABLET EVERY 12 HOURS AS NEEDED FOR  ANXIETY  Dispense: 30 tablet; Refill: 2  8. Post herpetic neuralgia - gabapentin (NEURONTIN) 300 MG capsule; Take 1 capsule (300 mg total) by mouth 2 (two) times daily.  Dispense: 60 capsule; Refill: 0   Labs pending Health Maintenance reviewed Diet and exercise encouraged  Follow up plan: 3 months   Mary-Margaret Hassell Done, [name]

## 2021-07-18 NOTE — Patient Instructions (Signed)

## 2021-07-19 ENCOUNTER — Ambulatory Visit (HOSPITAL_COMMUNITY): Payer: BC Managed Care – PPO | Admitting: Physician Assistant

## 2021-07-19 ENCOUNTER — Ambulatory Visit (HOSPITAL_COMMUNITY): Payer: BC Managed Care – PPO | Admitting: Anesthesiology

## 2021-07-19 ENCOUNTER — Observation Stay (HOSPITAL_COMMUNITY)
Admission: RE | Admit: 2021-07-19 | Discharge: 2021-07-20 | Disposition: A | Discharge status: Home / Self Care | Payer: BC Managed Care – PPO | Attending: Neurosurgery | Admitting: Neurosurgery

## 2021-07-19 ENCOUNTER — Ambulatory Visit (HOSPITAL_COMMUNITY): Payer: BC Managed Care – PPO

## 2021-07-19 ENCOUNTER — Other Ambulatory Visit: Payer: Self-pay

## 2021-07-19 ENCOUNTER — Encounter (HOSPITAL_COMMUNITY): Payer: Self-pay | Admitting: Neurosurgery

## 2021-07-19 ENCOUNTER — Encounter (HOSPITAL_COMMUNITY)
Admission: RE | Disposition: A | Discharge status: Home / Self Care | Payer: Self-pay | Source: Home / Self Care | Attending: Neurosurgery

## 2021-07-19 DIAGNOSIS — E119 Type 2 diabetes mellitus without complications: Secondary | ICD-10-CM | POA: Insufficient documentation

## 2021-07-19 DIAGNOSIS — J449 Chronic obstructive pulmonary disease, unspecified: Secondary | ICD-10-CM | POA: Insufficient documentation

## 2021-07-19 DIAGNOSIS — M4322 Fusion of spine, cervical region: Secondary | ICD-10-CM | POA: Diagnosis not present

## 2021-07-19 DIAGNOSIS — S129XXA Fracture of neck, unspecified, initial encounter: Secondary | ICD-10-CM | POA: Diagnosis present

## 2021-07-19 DIAGNOSIS — M960 Pseudarthrosis after fusion or arthrodesis: Secondary | ICD-10-CM | POA: Diagnosis not present

## 2021-07-19 DIAGNOSIS — J45909 Unspecified asthma, uncomplicated: Secondary | ICD-10-CM | POA: Diagnosis not present

## 2021-07-19 DIAGNOSIS — Z7982 Long term (current) use of aspirin: Secondary | ICD-10-CM | POA: Insufficient documentation

## 2021-07-19 DIAGNOSIS — Z419 Encounter for procedure for purposes other than remedying health state, unspecified: Secondary | ICD-10-CM

## 2021-07-19 DIAGNOSIS — Z87891 Personal history of nicotine dependence: Secondary | ICD-10-CM | POA: Insufficient documentation

## 2021-07-19 DIAGNOSIS — Z981 Arthrodesis status: Secondary | ICD-10-CM | POA: Diagnosis not present

## 2021-07-19 DIAGNOSIS — Z79899 Other long term (current) drug therapy: Secondary | ICD-10-CM | POA: Diagnosis not present

## 2021-07-19 DIAGNOSIS — I10 Essential (primary) hypertension: Secondary | ICD-10-CM | POA: Diagnosis not present

## 2021-07-19 DIAGNOSIS — Z7984 Long term (current) use of oral hypoglycemic drugs: Secondary | ICD-10-CM | POA: Diagnosis not present

## 2021-07-19 HISTORY — PX: POSTERIOR CERVICAL FUSION/FORAMINOTOMY: SHX5038

## 2021-07-19 LAB — GLUCOSE, CAPILLARY
Glucose-Capillary: 275 mg/dL — ABNORMAL HIGH (ref 70–99)
Glucose-Capillary: 265 mg/dL — ABNORMAL HIGH (ref 70–99)
Glucose-Capillary: 307 mg/dL — ABNORMAL HIGH (ref 70–99)
Glucose-Capillary: 322 mg/dL — ABNORMAL HIGH (ref 70–99)
Glucose-Capillary: 222 mg/dL — ABNORMAL HIGH (ref 70–99)

## 2021-07-19 LAB — ABO/RH: ABO/RH(D): A NEG

## 2021-07-19 SURGERY — POSTERIOR CERVICAL FUSION/FORAMINOTOMY LEVEL 2
Anesthesia: General

## 2021-07-19 MED ORDER — MIDAZOLAM HCL 2 MG/2ML IJ SOLN
INTRAMUSCULAR | Status: DC | PRN
  Administered 2021-07-19: 2 mg via INTRAVENOUS

## 2021-07-19 MED ORDER — LIDOCAINE 2% (20 MG/ML) 5 ML SYRINGE
INTRAMUSCULAR | Status: AC
  Filled 2021-07-19: qty 5

## 2021-07-19 MED ORDER — GABAPENTIN 300 MG PO CAPS
300.0000 mg | ORAL_CAPSULE | Freq: Two times a day (BID) | ORAL | Status: DC
  Administered 2021-07-19 – 2021-07-20 (×3): 300 mg via ORAL
  Filled 2021-07-19 (×3): qty 1

## 2021-07-19 MED ORDER — PRAMIPEXOLE DIHYDROCHLORIDE 0.25 MG PO TABS
0.7500 mg | ORAL_TABLET | Freq: Every day | ORAL | Status: DC
  Administered 2021-07-19: 0.75 mg via ORAL
  Filled 2021-07-19: qty 3

## 2021-07-19 MED ORDER — SODIUM CHLORIDE 0.9 % IV SOLN: 250.0000 mL | INTRAVENOUS | Status: DC

## 2021-07-19 MED ORDER — CHLORHEXIDINE GLUCONATE CLOTH 2 % EX PADS: 6.0000 | MEDICATED_PAD | Freq: Once | CUTANEOUS | Status: DC

## 2021-07-19 MED ORDER — PANTOPRAZOLE SODIUM 40 MG IV SOLR
40.0000 mg | Freq: Every day | INTRAVENOUS | Status: DC
  Administered 2021-07-19: 40 mg via INTRAVENOUS
  Filled 2021-07-19: qty 40

## 2021-07-19 MED ORDER — OXYCODONE HCL 5 MG/5ML PO SOLN: 5.0000 mg | Freq: Once | ORAL | Status: DC | PRN

## 2021-07-19 MED ORDER — POLYETHYLENE GLYCOL 3350 17 G PO PACK: 17.0000 g | PACK | Freq: Every day | ORAL | Status: DC | PRN

## 2021-07-19 MED ORDER — PROPOFOL 10 MG/ML IV BOLUS
INTRAVENOUS | Status: AC
  Filled 2021-07-19: qty 20

## 2021-07-19 MED ORDER — ACETAMINOPHEN 325 MG PO TABS: 650.0000 mg | ORAL_TABLET | ORAL | Status: DC | PRN

## 2021-07-19 MED ORDER — ACETAMINOPHEN 10 MG/ML IV SOLN: 1000.0000 mg | Freq: Once | INTRAVENOUS | Status: DC | PRN

## 2021-07-19 MED ORDER — OXYCODONE HCL 5 MG PO TABS: 5.0000 mg | ORAL_TABLET | Freq: Once | ORAL | Status: DC | PRN

## 2021-07-19 MED ORDER — HYDROMORPHONE HCL 1 MG/ML IJ SOLN
INTRAMUSCULAR | Status: AC
  Administered 2021-07-19: 0.5 mg via INTRAVENOUS
  Filled 2021-07-19: qty 2

## 2021-07-19 MED ORDER — SENNA 8.6 MG PO TABS
1.0000 | ORAL_TABLET | Freq: Two times a day (BID) | ORAL | Status: DC
  Administered 2021-07-19 – 2021-07-20 (×3): 8.6 mg via ORAL
  Filled 2021-07-19 (×3): qty 1

## 2021-07-19 MED ORDER — LISINOPRIL 20 MG PO TABS
40.0000 mg | ORAL_TABLET | Freq: Every day | ORAL | Status: DC
  Administered 2021-07-19 – 2021-07-20 (×2): 40 mg via ORAL
  Filled 2021-07-19 (×2): qty 2

## 2021-07-19 MED ORDER — THROMBIN 5000 UNITS EX SOLR
CUTANEOUS | Status: AC
  Filled 2021-07-19: qty 5000

## 2021-07-19 MED ORDER — SUGAMMADEX SODIUM 200 MG/2ML IV SOLN
INTRAVENOUS | Status: DC | PRN
Start: 2021-07-19 — End: 2021-07-19
  Administered 2021-07-19: 200 mg via INTRAVENOUS

## 2021-07-19 MED ORDER — AMISULPRIDE (ANTIEMETIC) 5 MG/2ML IV SOLN: 10.0000 mg | Freq: Once | INTRAVENOUS | Status: DC | PRN

## 2021-07-19 MED ORDER — INSULIN ASPART 100 UNIT/ML IJ SOLN
8.0000 [IU] | Freq: Once | INTRAMUSCULAR | Status: AC
  Administered 2021-07-19: 8 [IU] via SUBCUTANEOUS

## 2021-07-19 MED ORDER — FLEET ENEMA 7-19 GM/118ML RE ENEM: 1.0000 | ENEMA | Freq: Once | RECTAL | Status: DC | PRN

## 2021-07-19 MED ORDER — METOPROLOL TARTRATE 25 MG PO TABS
25.0000 mg | ORAL_TABLET | Freq: Two times a day (BID) | ORAL | Status: DC
  Administered 2021-07-19 – 2021-07-20 (×2): 25 mg via ORAL
  Filled 2021-07-19 (×3): qty 1

## 2021-07-19 MED ORDER — PROPOFOL 10 MG/ML IV BOLUS
INTRAVENOUS | Status: DC | PRN
Start: 2021-07-19 — End: 2021-07-19
  Administered 2021-07-19: 180 mg via INTRAVENOUS
  Administered 2021-07-19: 70 mg via INTRAVENOUS

## 2021-07-19 MED ORDER — CEFAZOLIN SODIUM-DEXTROSE 2-4 GM/100ML-% IV SOLN
2.0000 g | Freq: Three times a day (TID) | INTRAVENOUS | Status: AC
  Administered 2021-07-19 (×2): 2 g via INTRAVENOUS
  Filled 2021-07-19 (×2): qty 100

## 2021-07-19 MED ORDER — INSULIN ASPART 100 UNIT/ML IJ SOLN
INTRAMUSCULAR | Status: AC
  Filled 2021-07-19: qty 1

## 2021-07-19 MED ORDER — BACITRACIN ZINC 500 UNIT/GM EX OINT
TOPICAL_OINTMENT | CUTANEOUS | Status: AC
  Filled 2021-07-19: qty 28.35

## 2021-07-19 MED ORDER — MENTHOL 3 MG MT LOZG: 1.0000 | LOZENGE | OROMUCOSAL | Status: DC | PRN

## 2021-07-19 MED ORDER — LACTATED RINGERS IV SOLN: INTRAVENOUS | Status: DC | PRN

## 2021-07-19 MED ORDER — MORPHINE SULFATE (PF) 2 MG/ML IV SOLN
2.0000 mg | INTRAVENOUS | Status: DC | PRN
  Administered 2021-07-19: 2 mg via INTRAVENOUS
  Filled 2021-07-19: qty 1

## 2021-07-19 MED ORDER — BUPIVACAINE HCL (PF) 0.5 % IJ SOLN
INTRAMUSCULAR | Status: DC | PRN
  Administered 2021-07-19: 5 mL

## 2021-07-19 MED ORDER — CHLORHEXIDINE GLUCONATE 0.12 % MT SOLN
15.0000 mL | Freq: Once | OROMUCOSAL | Status: AC
  Administered 2021-07-19: 15 mL via OROMUCOSAL
  Filled 2021-07-19: qty 15

## 2021-07-19 MED ORDER — OXYCODONE HCL 5 MG PO TABS: 5.0000 mg | ORAL_TABLET | ORAL | Status: DC | PRN

## 2021-07-19 MED ORDER — ROCURONIUM BROMIDE 10 MG/ML (PF) SYRINGE
PREFILLED_SYRINGE | INTRAVENOUS | Status: DC | PRN
  Administered 2021-07-19: 30 mg via INTRAVENOUS
  Administered 2021-07-19: 70 mg via INTRAVENOUS

## 2021-07-19 MED ORDER — ACETAMINOPHEN 650 MG RE SUPP: 650.0000 mg | RECTAL | Status: DC | PRN

## 2021-07-19 MED ORDER — THROMBIN 5000 UNITS EX SOLR
OROMUCOSAL | Status: DC | PRN
  Administered 2021-07-19: 5 mL via TOPICAL

## 2021-07-19 MED ORDER — FENTANYL CITRATE (PF) 250 MCG/5ML IJ SOLN
INTRAMUSCULAR | Status: DC | PRN
  Administered 2021-07-19 (×3): 50 ug via INTRAVENOUS
  Administered 2021-07-19: 100 ug via INTRAVENOUS

## 2021-07-19 MED ORDER — ALBUTEROL SULFATE (2.5 MG/3ML) 0.083% IN NEBU: 2.5000 mg | INHALATION_SOLUTION | Freq: Four times a day (QID) | RESPIRATORY_TRACT | Status: DC | PRN

## 2021-07-19 MED ORDER — ONDANSETRON HCL 4 MG/2ML IJ SOLN: 4.0000 mg | Freq: Once | INTRAMUSCULAR | Status: DC | PRN

## 2021-07-19 MED ORDER — 0.9 % SODIUM CHLORIDE (POUR BTL) OPTIME
TOPICAL | Status: DC | PRN
  Administered 2021-07-19: 1000 mL

## 2021-07-19 MED ORDER — PHENOL 1.4 % MT LIQD: 1.0000 | OROMUCOSAL | Status: DC | PRN

## 2021-07-19 MED ORDER — FLUTICASONE PROPIONATE 50 MCG/ACT NA SUSP
2.0000 | Freq: Every day | NASAL | Status: DC | PRN
  Filled 2021-07-19: qty 16

## 2021-07-19 MED ORDER — ROCURONIUM BROMIDE 10 MG/ML (PF) SYRINGE
PREFILLED_SYRINGE | INTRAVENOUS | Status: AC
  Filled 2021-07-19: qty 10

## 2021-07-19 MED ORDER — LIDOCAINE-EPINEPHRINE 1 %-1:100000 IJ SOLN
INTRAMUSCULAR | Status: DC | PRN
  Administered 2021-07-19: 5 mL

## 2021-07-19 MED ORDER — DOCUSATE SODIUM 100 MG PO CAPS
100.0000 mg | ORAL_CAPSULE | Freq: Two times a day (BID) | ORAL | Status: DC
  Administered 2021-07-19 – 2021-07-20 (×3): 100 mg via ORAL
  Filled 2021-07-19 (×3): qty 1

## 2021-07-19 MED ORDER — SODIUM CHLORIDE 0.9 % IV SOLN: INTRAVENOUS | Status: DC

## 2021-07-19 MED ORDER — BUPIVACAINE HCL (PF) 0.5 % IJ SOLN
INTRAMUSCULAR | Status: AC
  Filled 2021-07-19: qty 30

## 2021-07-19 MED ORDER — INSULIN ASPART 100 UNIT/ML IJ SOLN
0.0000 [IU] | Freq: Every day | INTRAMUSCULAR | Status: DC
  Administered 2021-07-19: 2 [IU] via SUBCUTANEOUS

## 2021-07-19 MED ORDER — DEXAMETHASONE SODIUM PHOSPHATE 10 MG/ML IJ SOLN
INTRAMUSCULAR | Status: DC | PRN
Start: 2021-07-19 — End: 2021-07-19
  Administered 2021-07-19: 4 mg via INTRAVENOUS

## 2021-07-19 MED ORDER — PHENYLEPHRINE HCL-NACL 20-0.9 MG/250ML-% IV SOLN: INTRAVENOUS | Status: DC | PRN

## 2021-07-19 MED ORDER — ONDANSETRON HCL 4 MG/2ML IJ SOLN
INTRAMUSCULAR | Status: AC
  Filled 2021-07-19: qty 2

## 2021-07-19 MED ORDER — METHOCARBAMOL 1000 MG/10ML IJ SOLN
500.0000 mg | Freq: Four times a day (QID) | INTRAVENOUS | Status: DC | PRN
  Filled 2021-07-19: qty 5

## 2021-07-19 MED ORDER — SODIUM CHLORIDE 0.9% FLUSH: 3.0000 mL | INTRAVENOUS | Status: DC | PRN

## 2021-07-19 MED ORDER — AMLODIPINE BESYLATE 5 MG PO TABS
10.0000 mg | ORAL_TABLET | Freq: Every day | ORAL | Status: DC
  Administered 2021-07-19 – 2021-07-20 (×2): 10 mg via ORAL
  Filled 2021-07-19 (×2): qty 2

## 2021-07-19 MED ORDER — LACTATED RINGERS IV SOLN: INTRAVENOUS | Status: DC

## 2021-07-19 MED ORDER — HYDROMORPHONE HCL 1 MG/ML IJ SOLN
0.2500 mg | INTRAMUSCULAR | Status: DC | PRN
  Administered 2021-07-19 (×2): 0.5 mg via INTRAVENOUS

## 2021-07-19 MED ORDER — MIDAZOLAM HCL 2 MG/2ML IJ SOLN
INTRAMUSCULAR | Status: AC
  Filled 2021-07-19: qty 2

## 2021-07-19 MED ORDER — LIDOCAINE-EPINEPHRINE 1 %-1:100000 IJ SOLN
INTRAMUSCULAR | Status: AC
  Filled 2021-07-19: qty 1

## 2021-07-19 MED ORDER — HYDROMORPHONE HCL 1 MG/ML IJ SOLN
INTRAMUSCULAR | Status: DC | PRN
  Administered 2021-07-19: .5 mg via INTRAVENOUS

## 2021-07-19 MED ORDER — GLIMEPIRIDE 2 MG PO TABS
4.0000 mg | ORAL_TABLET | Freq: Every day | ORAL | Status: DC
  Administered 2021-07-20: 4 mg via ORAL
  Filled 2021-07-19: qty 2

## 2021-07-19 MED ORDER — LIDOCAINE 2% (20 MG/ML) 5 ML SYRINGE
INTRAMUSCULAR | Status: DC | PRN
  Administered 2021-07-19: 100 mg via INTRAVENOUS

## 2021-07-19 MED ORDER — INSULIN ASPART 100 UNIT/ML IJ SOLN
0.0000 [IU] | Freq: Three times a day (TID) | INTRAMUSCULAR | Status: DC
  Administered 2021-07-19 – 2021-07-20 (×2): 11 [IU] via SUBCUTANEOUS

## 2021-07-19 MED ORDER — PHENYLEPHRINE 40 MCG/ML (10ML) SYRINGE FOR IV PUSH (FOR BLOOD PRESSURE SUPPORT)
PREFILLED_SYRINGE | INTRAVENOUS | Status: AC
  Filled 2021-07-19: qty 10

## 2021-07-19 MED ORDER — METFORMIN HCL 500 MG PO TABS
1000.0000 mg | ORAL_TABLET | Freq: Two times a day (BID) | ORAL | Status: DC
  Administered 2021-07-19 – 2021-07-20 (×2): 1000 mg via ORAL
  Filled 2021-07-19 (×2): qty 2

## 2021-07-19 MED ORDER — INSULIN ASPART 100 UNIT/ML IJ SOLN
INTRAMUSCULAR | Status: AC
  Administered 2021-07-19: 8 [IU] via SUBCUTANEOUS
  Filled 2021-07-19: qty 1

## 2021-07-19 MED ORDER — ORAL CARE MOUTH RINSE: 15.0000 mL | Freq: Once | OROMUCOSAL | Status: AC

## 2021-07-19 MED ORDER — HYDROMORPHONE HCL 1 MG/ML IJ SOLN
INTRAMUSCULAR | Status: AC
  Filled 2021-07-19: qty 0.5

## 2021-07-19 MED ORDER — EPHEDRINE SULFATE-NACL 50-0.9 MG/10ML-% IV SOSY
PREFILLED_SYRINGE | INTRAVENOUS | Status: DC | PRN
Start: 2021-07-19 — End: 2021-07-19
  Administered 2021-07-19: 5 mg via INTRAVENOUS

## 2021-07-19 MED ORDER — ESCITALOPRAM OXALATE 10 MG PO TABS
10.0000 mg | ORAL_TABLET | Freq: Every day | ORAL | Status: DC
  Administered 2021-07-19: 10 mg via ORAL
  Filled 2021-07-19: qty 1

## 2021-07-19 MED ORDER — PHENYLEPHRINE HCL-NACL 20-0.9 MG/250ML-% IV SOLN
INTRAVENOUS | Status: DC | PRN
Start: 2021-07-19 — End: 2021-07-19
  Administered 2021-07-19: 30 ug/min via INTRAVENOUS

## 2021-07-19 MED ORDER — DEXAMETHASONE SODIUM PHOSPHATE 10 MG/ML IJ SOLN
INTRAMUSCULAR | Status: AC
  Filled 2021-07-19: qty 1

## 2021-07-19 MED ORDER — BACITRACIN ZINC 500 UNIT/GM EX OINT
TOPICAL_OINTMENT | CUTANEOUS | Status: DC | PRN
Start: 2021-07-19 — End: 2021-07-19
  Administered 2021-07-19: 1 via TOPICAL

## 2021-07-19 MED ORDER — METHOCARBAMOL 500 MG PO TABS
500.0000 mg | ORAL_TABLET | Freq: Four times a day (QID) | ORAL | Status: DC | PRN
  Administered 2021-07-19 – 2021-07-20 (×3): 500 mg via ORAL
  Filled 2021-07-19 (×3): qty 1

## 2021-07-19 MED ORDER — SODIUM CHLORIDE 0.9% FLUSH
3.0000 mL | Freq: Two times a day (BID) | INTRAVENOUS | Status: DC
  Administered 2021-07-19 (×2): 3 mL via INTRAVENOUS

## 2021-07-19 MED ORDER — BISACODYL 10 MG RE SUPP: 10.0000 mg | Freq: Every day | RECTAL | Status: DC | PRN

## 2021-07-19 MED ORDER — ONDANSETRON HCL 4 MG PO TABS: 4.0000 mg | ORAL_TABLET | Freq: Four times a day (QID) | ORAL | Status: DC | PRN

## 2021-07-19 MED ORDER — ONDANSETRON HCL 4 MG/2ML IJ SOLN
INTRAMUSCULAR | Status: DC | PRN
  Administered 2021-07-19: 4 mg via INTRAVENOUS

## 2021-07-19 MED ORDER — ONDANSETRON HCL 4 MG/2ML IJ SOLN: 4.0000 mg | Freq: Four times a day (QID) | INTRAMUSCULAR | Status: DC | PRN

## 2021-07-19 MED ORDER — FENTANYL CITRATE (PF) 250 MCG/5ML IJ SOLN
INTRAMUSCULAR | Status: AC
  Filled 2021-07-19: qty 5

## 2021-07-19 MED ORDER — CEFAZOLIN SODIUM-DEXTROSE 2-4 GM/100ML-% IV SOLN
2.0000 g | INTRAVENOUS | Status: AC
  Administered 2021-07-19: 2 g via INTRAVENOUS
  Filled 2021-07-19: qty 100

## 2021-07-19 MED ORDER — OXYCODONE HCL 5 MG PO TABS
10.0000 mg | ORAL_TABLET | ORAL | Status: DC | PRN
  Administered 2021-07-19 – 2021-07-20 (×5): 10 mg via ORAL
  Filled 2021-07-19 (×5): qty 2

## 2021-07-19 MED ORDER — NITROGLYCERIN 0.4 MG SL SUBL: 0.4000 mg | SUBLINGUAL_TABLET | SUBLINGUAL | Status: DC | PRN

## 2021-07-19 SURGICAL SUPPLY — 61 items
BAG COUNTER SPONGE SURGICOUNT (BAG) ×3 IMPLANT
BENZOIN TINCTURE PRP APPL 2/3 (GAUZE/BANDAGES/DRESSINGS) ×1 IMPLANT
BIT DRILL NEURO 2X3.1 SFT TUCH (MISCELLANEOUS) IMPLANT
BLADE CLIPPER SURG (BLADE) ×1 IMPLANT
BLADE ULTRA TIP 2M (BLADE) IMPLANT
BUR MATCHSTICK NEURO 3.0 LAGG (BURR) IMPLANT
BUR PRECISION FLUTE 5.0 (BURR) ×2 IMPLANT
CANISTER SUCT 3000ML PPV (MISCELLANEOUS) ×2 IMPLANT
CARTRIDGE OIL MAESTRO DRILL (MISCELLANEOUS) ×1 IMPLANT
DECANTER SPIKE VIAL GLASS SM (MISCELLANEOUS) ×2 IMPLANT
DERMABOND ADVANCED (GAUZE/BANDAGES/DRESSINGS) ×1
DERMABOND ADVANCED .7 DNX12 (GAUZE/BANDAGES/DRESSINGS) IMPLANT
DIFFUSER DRILL AIR PNEUMATIC (MISCELLANEOUS) ×2 IMPLANT
DRAPE C-ARM 42X72 X-RAY (DRAPES) ×4 IMPLANT
DRAPE LAPAROTOMY 100X72 PEDS (DRAPES) ×2 IMPLANT
DRILL NEURO 2X3.1 SOFT TOUCH (MISCELLANEOUS) ×2
DRSG OPSITE POSTOP 4X6 (GAUZE/BANDAGES/DRESSINGS) ×1 IMPLANT
DURAPREP 6ML APPLICATOR 50/CS (WOUND CARE) ×2 IMPLANT
ELECT REM PT RETURN 9FT ADLT (ELECTROSURGICAL) ×2
ELECTRODE REM PT RTRN 9FT ADLT (ELECTROSURGICAL) ×1 IMPLANT
GAUZE 4X4 16PLY ~~LOC~~+RFID DBL (SPONGE) ×1 IMPLANT
GAUZE SPONGE 4X4 12PLY STRL (GAUZE/BANDAGES/DRESSINGS) IMPLANT
GLOVE EXAM NITRILE XL STR (GLOVE) IMPLANT
GLOVE SURG ENC MOIS LTX SZ7.5 (GLOVE) ×1 IMPLANT
GLOVE SURG LTX SZ7 (GLOVE) ×2 IMPLANT
GLOVE SURG POLYISO LF SZ6.5 (GLOVE) ×1 IMPLANT
GLOVE SURG POLYISO LF SZ7 (GLOVE) ×1 IMPLANT
GLOVE SURG UNDER POLY LF SZ6.5 (GLOVE) ×1 IMPLANT
GLOVE SURG UNDER POLY LF SZ7.5 (GLOVE) ×4 IMPLANT
GOWN STRL REUS W/ TWL LRG LVL3 (GOWN DISPOSABLE) IMPLANT
GOWN STRL REUS W/ TWL XL LVL3 (GOWN DISPOSABLE) ×1 IMPLANT
GOWN STRL REUS W/TWL 2XL LVL3 (GOWN DISPOSABLE) IMPLANT
GOWN STRL REUS W/TWL LRG LVL3 (GOWN DISPOSABLE) ×1
GOWN STRL REUS W/TWL XL LVL3 (GOWN DISPOSABLE) ×2
HEMOSTAT POWDER SURGIFOAM 1G (HEMOSTASIS) ×1 IMPLANT
KIT BASIN OR (CUSTOM PROCEDURE TRAY) ×2 IMPLANT
KIT TURNOVER KIT B (KITS) ×2 IMPLANT
NEEDLE HYPO 22GX1.5 SAFETY (NEEDLE) ×2 IMPLANT
NS IRRIG 1000ML POUR BTL (IV SOLUTION) ×2 IMPLANT
OIL CARTRIDGE MAESTRO DRILL (MISCELLANEOUS) ×2
PACK LAMINECTOMY NEURO (CUSTOM PROCEDURE TRAY) ×2 IMPLANT
PAD ARMBOARD 7.5X6 YLW CONV (MISCELLANEOUS) ×3 IMPLANT
PIN MAYFIELD SKULL DISP (PIN) ×2 IMPLANT
PUTTY DBF 6CC CORTICAL FIBERS (Putty) ×1 IMPLANT
ROD PRE CUT 3.5X40MM SPINAL (Rod) ×2 IMPLANT
SCREW MULTI AXIAL 3.5X14MM (Screw) ×6 IMPLANT
SCREW SET 3600315 STANDARD (Screw) ×12 IMPLANT
SCREW SET 3600315 STD (Screw) IMPLANT
SPONGE SURGIFOAM ABS GEL SZ50 (HEMOSTASIS) ×1 IMPLANT
SPONGE T-LAP 4X18 ~~LOC~~+RFID (SPONGE) ×1 IMPLANT
STAPLER VISISTAT 35W (STAPLE) ×2 IMPLANT
STRIP CLOSURE SKIN 1/2X4 (GAUZE/BANDAGES/DRESSINGS) ×1 IMPLANT
SUT ETHILON 3 0 FSL (SUTURE) IMPLANT
SUT VIC AB 0 CT1 18XCR BRD8 (SUTURE) ×1 IMPLANT
SUT VIC AB 0 CT1 8-18 (SUTURE) ×3
SUT VIC AB 2-0 CT1 18 (SUTURE) IMPLANT
SUT VICRYL 3-0 RB1 18 ABS (SUTURE) ×2 IMPLANT
TOWEL GREEN STERILE (TOWEL DISPOSABLE) ×2 IMPLANT
TOWEL GREEN STERILE FF (TOWEL DISPOSABLE) ×2 IMPLANT
UNDERPAD 30X36 HEAVY ABSORB (UNDERPADS AND DIAPERS) ×1 IMPLANT
WATER STERILE IRR 1000ML POUR (IV SOLUTION) ×2 IMPLANT

## 2021-07-19 NOTE — Transfer of Care (Signed)
Immediate Anesthesia Transfer of Care Note  Patient: Benjamin Hale  Procedure(s) Performed: Cervical five- six Cervical six-seven Lateral Mass Fusion  Patient Location: PACU  Anesthesia Type:General  Level of Consciousness: drowsy  Airway & Oxygen Therapy: Patient Spontanous Breathing and Patient connected to face mask oxygen  Post-op Assessment: Report given to RN and Post -op Vital signs reviewed and stable  Post vital signs: Reviewed and stable  Last Vitals:  Vitals Value Taken Time  BP 163/71 07/19/21 0951  Temp    Pulse 93 07/19/21 0952  Resp 14 07/19/21 0952  SpO2 97 % 07/19/21 0952  Vitals shown include unvalidated device data.  Last Pain:  Vitals:   07/19/21 0605  TempSrc:   PainSc: 4       Patients Stated Pain Goal: 3 ([id number])  Complications: No notable events documented.

## 2021-07-19 NOTE — Anesthesia Procedure Notes (Signed)
Procedure Name: Intubation Date/Time: 07/19/2021 7:57 AM Performed by: Carolan Clines, CRNA Pre-anesthesia Checklist: Patient identified, Emergency Drugs available, Suction available and Patient being monitored Patient Re-evaluated:Patient Re-evaluated prior to induction Oxygen Delivery Method: Circle System Utilized Preoxygenation: Pre-oxygenation with 100% oxygen Induction Type: IV induction Ventilation: Mask ventilation without difficulty and Oral airway inserted - appropriate to patient size Laryngoscope Size: Glidescope and 4 Grade View: Grade I Tube type: Oral Tube size: 7.5 mm Number of attempts: 1 Airway Equipment and Method: Stylet and Oral airway Placement Confirmation: ETT inserted through vocal cords under direct vision, positive ETCO2 and breath sounds checked- equal and bilateral Secured at: 23 cm Tube secured with: Tape Dental Injury: Teeth and Oropharynx as per pre-operative assessment  Difficulty Due To: Difficulty was anticipated and Difficult Airway- due to reduced neck mobility Comments: Elective glidescope intubation.

## 2021-07-19 NOTE — Op Note (Signed)
NEUROSURGERY OPERATIVE NOTE   PREOP DIAGNOSIS:  Pseudoarthrosis, C6-7   POSTOP DIAGNOSIS: Same  PROCEDURE: Posterolateral arthrodesis, C5-C7 Posterior segmental instrumentation, C5-7 Medtronic vertex lateral mass screws Use of locally harvested morcellized bone autograft Use of morcellized bone allograft - Medtronic DBM  SURGEON: Dr. Lisbeth Renshaw, MD  ASSISTANT: Hildred Priest, NP-C  ANESTHESIA: General Endotracheal  EBL: 50cc  SPECIMENS: None  DRAINS: None  COMPLICATIONS: None immediate  CONDITION: Hemodynamically stable to PACU  HISTORY: Benjamin Hale is a 56 y.o. male who previously underwent C5-C7 ACDF several years ago.  While he did have significant improvement in his symptoms, he began to complain of posterior neck pain with subjective left arm weakness.  Imaging did reveal pseudoarthrosis at C6-7 without any significant neural compression on MRI.  He attempted conservative treatments including application of an external bone growth stimulator without improvement in his symptoms and repeat imaging revealing continued pseudoarthrosis.  He therefore elected to proceed with posterior surgical instrumentation and fusion.  The risks, benefits, and alternatives to surgery were all reviewed in detail with the patient.  After all his questions were answered informed consent was obtained and witnessed.  PROCEDURE IN DETAIL: The patient was brought to the operating room. After induction of general anesthesia, the patient was positioned on the operative table in the prone position in the [place] head holder.  All pressure points were meticulously padded. Skin incision was then marked out and prepped and draped in the usual sterile fashion.  After timeout was conducted, the midline skin incision was infiltrated with local anesthetic with epinephrine.  Incision was then made slightly above the prominence of the C7 spinous process.  This was carried down through the nuchal  fascia and subperiosteal dissection was carried out along the presumed C5-C6 and C7 spinous processes.  A penetrating towel clip was placed on the C6 spinous process and intraoperative lateral fluoroscopy was taken to confirm our location at the correct level.  Bovie was then used in the subperiosteal plane to further dissect out towards the lateral masses.  Self-retaining retractors were then placed.  A rongeur was then used to remove the spinous processes at C5-C6 and C7.  This bone was morselized and cleaned of any soft tissue for use as autograft.  At this point utilizing standard anatomic landmarks, entry points for bilateral C5-C6 and C7 lateral mass screws were identified.  These were then drilled and tapped.  Using a sound, all pilot holes appear to be well within the lateral masses.  The exposed lateral masses and lamina as well as the C6-7 facet complex were decorticated with a high-speed drill.  Of note, there did appear to be arthrosis of the C5-6 facet joint.  Once the exposed bone surfaces were decorticated, lateral mass screws were placed bilaterally at C5-C6 and C7.  Pre-bent 40 mm lordotic rod was then placed and setscrews were placed and final tightened.  Final AP fluoroscopy revealed good position of the implanted hardware.  At this point, the bone autograft was mixed with demineralized bone matrix and placed over the exposed decorticated bone surfaces for posterolateral arthrodesis.  Self-retaining retractor was then removed and hemostasis was secured on the muscular edges using a combination of bipolar electrocautery and morselized Gelfoam with thrombin.  The wound was then closed in multiple layers in standard fashion using a combination of interrupted 0 and 3-0 Vicryl stitches.  Skin was closed with Dermabond.  Once this dried a sterile honeycomb dressing was applied.  Patient was then transferred  to the stretcher and the [place] head holder was removed.  At the end of the case all  sponge, needle, instrument, and cottonoid counts were correct.  The patient was then transferred to the stretcher, extubated, and taken to the post-anesthesia care unit in stable hemodynamic condition.    Lisbeth Renshaw, MD [facility]

## 2021-07-19 NOTE — Anesthesia Postprocedure Evaluation (Signed)
Anesthesia Post Note  Patient: Benjamin Hale  Procedure(s) Performed: Cervical five- six Cervical six-seven Lateral Mass Fusion     Patient location during evaluation: PACU Anesthesia Type: General Level of consciousness: awake and alert Pain management: pain level controlled Vital Signs Assessment: post-procedure vital signs reviewed and stable Respiratory status: spontaneous breathing, nonlabored ventilation, respiratory function stable and patient connected to nasal cannula oxygen Cardiovascular status: blood pressure returned to baseline and stable Postop Assessment: no apparent nausea or vomiting Anesthetic complications: no   No notable events documented.  Last Vitals:  Vitals:   07/19/21 1020 07/19/21 1040  BP: (!) 149/72   Pulse: 81 88  Resp: 14 12  Temp:    SpO2: 93% 94%    Last Pain:  Vitals:   07/19/21 1040  TempSrc:   PainSc: 8       LLE Sensation: Full sensation (07/19/21 1035)   RLE Sensation: Full sensation (07/19/21 1035)      Barnet Glasgow

## 2021-07-19 NOTE — H&P (Signed)
Chief Complaint  Neck pain  History of Present Illness  Benjamin Hale is a 57 y.o. male with a history of previous C5-C7 ACDF.  He initially did well, but over the last several months has been complaining of worsening neck pain with subjective primarily left shoulder and arm weakness.  He attempted a an external bone growth stimulator with repeat imaging revealing continued pseudoarthrosis at C6-7.  He is therefore elected to proceed with posterior instrumentation fusion.  Past Medical History   Past Medical History:  Diagnosis Date   Anginal pain ([place])    Arthritis    "neck" (12/23/2012)   Asthma    Atrial flutter ([place])    a. s/p cervical spine surgery 4/14 => s/p DCCV;  b.  Echo 09/2012:  Mild LVH, EF 60-65%, Gr 2 DD.c. s/p atrial flutter ablation 12/23/2012 by Dr Rayann Heman   Cervical disc disease    s/p cervical spine surgery 4/14   COPD (chronic obstructive pulmonary disease) (HCC)    Diabetes mellitus without complication (HCC)    Dysrhythmia    Exertional shortness of breath    GERD (gastroesophageal reflux disease)    Headache    "weekly" (12/23/2012)   HTN (hypertension)    Hyperlipidemia    Hypogonadism male    Migraines    "once q 3-4 months" (12/23/2012)    Past Surgical History   Past Surgical History:  Procedure Laterality Date   ANTERIOR CERVICAL DECOMP/DISCECTOMY FUSION N/A 09/22/2012   Procedure: ANTERIOR CERVICAL DECOMPRESSION/DISCECTOMY FUSION 2 LEVELS;  Surgeon: Floyce Stakes, MD;  Location: [facility];  Service: Neurosurgery;  Laterality: N/A;  Cervical five-six Cervical six-seven Anterior cervical decompression/diskectomy/fusion   ATRIAL FLUTTER ABLATION  12/23/2012   CTI ablation by Dr Rayann Heman   ATRIAL FLUTTER ABLATION N/A 12/23/2012   Procedure: ATRIAL FLUTTER ABLATION;  Surgeon: Thompson Grayer, MD;  Location: [facility] CATH LAB;  Service: Cardiovascular;  Laterality: N/A;   CARDIAC CATHETERIZATION N/A 11/16/2015   Procedure: Left Heart Cath and Coronary  Angiography;  Surgeon: Leonie Man, MD;  Location: [place] CV LAB;  Service: Cardiovascular;  Laterality: N/A;   CARDIOVERSION N/A 09/23/2012   Procedure: CARDIOVERSION;  Surgeon: Renella Cunas, MD;  Location: [place] OR;  Service: Cardiovascular;  Laterality: N/A;   TONSILLECTOMY  1973   TREATMENT FISTULA ANAL  ~ 2007   WISDOM TOOTH EXTRACTION      Social History   Social History   Tobacco Use   Smoking status: Former    Packs/day: 1.50    Years: 22.00    Pack years: 33.00    Types: Cigarettes    Quit date: 06/16/2005    Years since quitting: 16.1   Smokeless tobacco: Never  Vaping Use   Vaping Use: Every day  Substance Use Topics   Alcohol use: Not Currently    Comment: rarely   Drug use: No    Medications   Prior to Admission medications   Medication Sig Start Date End Date Taking? Authorizing Provider  amLODipine (NORVASC) 10 MG tablet Take 1 tablet (10 mg total) by mouth daily. 07/18/21  Yes [name], Mary-Margaret, [name]  Ascorbic Acid (VITAMIN C PO) Take 1 tablet by mouth daily.   Yes [provider]  aspirin EC 81 MG tablet Take 81 mg by mouth daily.   Yes [provider]  diazepam (VALIUM) 5 MG tablet TAKE 1 TABLET EVERY 12 HOURS AS NEEDED FOR ANXIETY 07/18/21  Yes Hassell Done, Mary-Margaret, [name]  escitalopram (LEXAPRO) 10 MG tablet  TAKE ONE (1) TABLET EACH DAY 07/18/21  Yes Hassell Done, Mary-Margaret, [name]  fenofibrate (TRICOR) 145 MG tablet Take 1 tablet (145 mg total) by mouth daily. 07/18/21  Yes [name], Mary-Margaret, [name]  fluticasone (FLONASE) 50 MCG/ACT nasal spray Place 2 sprays into the nose daily as needed for rhinitis or allergies.   Yes [provider]  gabapentin (NEURONTIN) 300 MG capsule Take 1 capsule (300 mg total) by mouth 2 (two) times daily. 07/18/21  Yes Hassell Done, Mary-Margaret, [name]  glimepiride (AMARYL) 4 MG tablet Take 1 tablet (4 mg total) by mouth daily before breakfast. 07/18/21  Yes Hassell Done, Mary-Margaret, [name]  ibuprofen (ADVIL,MOTRIN) 200 MG  tablet Take 600 mg by mouth every 6 (six) hours as needed (For headache or pain.).   Yes [provider]  lisinopril (ZESTRIL) 40 MG tablet Take 1 tablet (40 mg total) by mouth daily. 07/18/21  Yes Hassell Done, Mary-Margaret, [name]  metFORMIN (GLUCOPHAGE) 1000 MG tablet Take 1 tablet (1,000 mg total) by mouth 2 (two) times daily with a meal. 07/18/21  Yes Hassell Done, Mary-Margaret, [name]  metoprolol tartrate (LOPRESSOR) 25 MG tablet Take 1 tablet (25 mg total) by mouth 2 (two) times daily. 07/18/21 10/16/21 Yes [name], Mary-Margaret, [name]  Multiple Vitamin (MULTIVITAMIN WITH MINERALS) TABS tablet Take 1 tablet by mouth daily.   Yes [provider]  omega-3 acid ethyl esters (LOVAZA) 1 g capsule Take 1 capsule (1 g total) by mouth 2 (two) times daily. 07/18/21  Yes Hassell Done, Mary-Margaret, [name]  omeprazole (PRILOSEC) 20 MG capsule Take 1 capsule (20 mg total) by mouth daily as needed (for acid reflux). Acid reflux. 07/18/21  Yes Hassell Done, Mary-Margaret, [name]  Probiotic Product (PROBIOTIC PO) Take 1 capsule by mouth daily.   Yes [provider]  sildenafil (REVATIO) 20 MG tablet Take 1 tablet (20 mg total) by mouth 3 (three) times daily. 12/22/19  Yes Hassell Done, Mary-Margaret, [name]  albuterol (VENTOLIN HFA) 108 (90 Base) MCG/ACT inhaler Inhale 2 puffs into the lungs every 6 (six) hours as needed for wheezing or shortness of breath. 07/10/20   Sharion Balloon, [name]  blood glucose meter kit and supplies Dispense based on patient and insurance preference. Use up to four times daily as directed. (FOR ICD-10 E10.9, E11.9). 08/17/18   Chevis Pretty, [name]  glucose blood [facility] VERIO) test strip Test BS QID Dx E11.9 07/18/19   Chevis Pretty, [name]  naproxen (NAPROSYN) 500 MG tablet Take 1 tablet (500 mg total) by mouth 2 (two) times daily with a meal. 11/16/20   Hassell Done, Mary-Margaret, [name]  nitroGLYCERIN (NITROSTAT) 0.4 MG SL tablet Place 1 tablet (0.4 mg total) under the tongue every 5 (five) minutes as needed for  chest pain. 11/13/15   Erlene Quan, PA-C  ONETOUCH DELICA LANCETS 89V MISC EVERY DAY 04/22/16   Hassell Done, Mary-Margaret, [name]  pramipexole (MIRAPEX) 0.75 MG tablet Take 1 tablet (0.75 mg total) by mouth at bedtime. 07/18/21   Hassell Done Mary-Margaret, [name]    Allergies  No Known Allergies  Review of Systems  ROS  Neurologic Exam  Awake, alert, oriented Memory and concentration grossly intact Speech fluent, appropriate CN grossly intact Motor exam: Upper Extremities Deltoid Bicep Tricep Grip  Right 5/5 5/5 5/5 5/5  Left 5/5 5/5 5/5 5/5   Lower Extremities IP Quad PF DF EHL  Right 5/5 5/5 5/5 5/5 5/5  Left 5/5 5/5 5/5 5/5 5/5   Sensation grossly intact to LT  Imaging  CT scan of the cervical spine was personally reviewed and demonstrate straightening  of the normal cervical lordosis.  Anterior interbody cages and anterior plate appear to be in satisfactory position however there is no convincing evidence of interbody fusion at C6-7.  MRI was also reviewed without evidence of severe high-grade central or neuroforaminal stenosis.  Impression  - 57 y.o. male with continued neck pain and subjective left arm weakness likely related to pseudoarthrosis at C6-7  Plan  -We will plan on proceeding with posterior instrumentation and lateral mass fusion C5-C7  I have reviewed the details of the operation as well as the expected postoperative course and recovery with the patient in the office.  We have also discussed the associated risks, benefits, and alternatives to surgery.  All his questions today were answered and he provided informed consent to proceed.   Consuella Lose, MD [address] [facility]

## 2021-07-20 DIAGNOSIS — Z79899 Other long term (current) drug therapy: Secondary | ICD-10-CM | POA: Diagnosis not present

## 2021-07-20 DIAGNOSIS — E119 Type 2 diabetes mellitus without complications: Secondary | ICD-10-CM | POA: Diagnosis not present

## 2021-07-20 DIAGNOSIS — J45909 Unspecified asthma, uncomplicated: Secondary | ICD-10-CM | POA: Diagnosis not present

## 2021-07-20 DIAGNOSIS — I10 Essential (primary) hypertension: Secondary | ICD-10-CM | POA: Diagnosis not present

## 2021-07-20 DIAGNOSIS — Z7982 Long term (current) use of aspirin: Secondary | ICD-10-CM | POA: Diagnosis not present

## 2021-07-20 DIAGNOSIS — J449 Chronic obstructive pulmonary disease, unspecified: Secondary | ICD-10-CM | POA: Diagnosis not present

## 2021-07-20 DIAGNOSIS — Z7984 Long term (current) use of oral hypoglycemic drugs: Secondary | ICD-10-CM | POA: Diagnosis not present

## 2021-07-20 DIAGNOSIS — M960 Pseudarthrosis after fusion or arthrodesis: Secondary | ICD-10-CM | POA: Diagnosis not present

## 2021-07-20 DIAGNOSIS — Z87891 Personal history of nicotine dependence: Secondary | ICD-10-CM | POA: Diagnosis not present

## 2021-07-20 LAB — GLUCOSE, CAPILLARY: Glucose-Capillary: 307 mg/dL — ABNORMAL HIGH (ref 70–99)

## 2021-07-20 MED ORDER — OXYCODONE HCL 10 MG PO TABS
10.0000 mg | ORAL_TABLET | ORAL | 0 refills | Status: DC | PRN
Start: 2021-07-20 — End: 2022-04-21

## 2021-07-20 NOTE — Plan of Care (Signed)
Adequately Ready for discharge °

## 2021-07-20 NOTE — Discharge Summary (Signed)
Physician Discharge Summary  Patient ID: Benjamin Hale MRN: [id number] DOB/AGE: [birth year] 56 y.o.  Admit date: 07/19/2021 Discharge date: 07/20/2021  Admission Diagnoses:  Discharge Diagnoses:  Principal Problem:   Pseudoarthrosis of cervical spine [facility])   Discharged Condition: good  Hospital Course: 56 year old male admitted to hospital where he underwent uncomplicated posterior cervical fusion for treatment of a symptomatic pseudoarthrosis.  Patient has done well postop.  No new pain numbness or weakness.  Ambulating and voiding without difficulty.  Ready for discharge home.  Consults:   Significant Diagnostic Studies:   Treatments:   Discharge Exam: Blood pressure (!) 148/74, pulse 82, temperature 98.6 F (37 C), temperature source Oral, resp. rate 18, height _0  (1.778 m), weight 98.4 kg, SpO2 93 %. Awake and alert.  Oriented and appropriate.  Motor and sensory function intact.  Wound clean and dry.  Chest and abdomen benign.  Disposition: Discharge disposition: 01-Home or Self Care       Discharge Instructions     Incentive spirometry RT   Complete by: As directed       Allergies as of 07/20/2021   No Known Allergies      Medication List     TAKE these medications    albuterol 108 (90 Base) MCG/ACT inhaler Commonly known as: VENTOLIN HFA Inhale 2 puffs into the lungs every 6 (six) hours as needed for wheezing or shortness of breath.   amLODipine 10 MG tablet Commonly known as: NORVASC Take 1 tablet (10 mg total) by mouth daily.   aspirin EC 81 MG tablet Take 81 mg by mouth daily.   blood glucose meter kit and supplies Dispense based on patient and insurance preference. Use up to four times daily as directed. (FOR ICD-10 E10.9, E11.9).   diazepam 5 MG tablet Commonly known as: VALIUM TAKE 1 TABLET EVERY 12 HOURS AS NEEDED FOR ANXIETY   escitalopram 10 MG tablet Commonly known as: LEXAPRO TAKE ONE (1) TABLET EACH DAY   fenofibrate 145  MG tablet Commonly known as: TRICOR Take 1 tablet (145 mg total) by mouth daily.   fluticasone 50 MCG/ACT nasal spray Commonly known as: FLONASE Place 2 sprays into the nose daily as needed for rhinitis or allergies.   gabapentin 300 MG capsule Commonly known as: NEURONTIN Take 1 capsule (300 mg total) by mouth 2 (two) times daily.   glimepiride 4 MG tablet Commonly known as: AMARYL Take 1 tablet (4 mg total) by mouth daily before breakfast.   ibuprofen 200 MG tablet Commonly known as: ADVIL Take 600 mg by mouth every 6 (six) hours as needed (For headache or pain.).   lisinopril 40 MG tablet Commonly known as: ZESTRIL Take 1 tablet (40 mg total) by mouth daily.   metFORMIN 1000 MG tablet Commonly known as: GLUCOPHAGE Take 1 tablet (1,000 mg total) by mouth 2 (two) times daily with a meal.   metoprolol tartrate 25 MG tablet Commonly known as: LOPRESSOR Take 1 tablet (25 mg total) by mouth 2 (two) times daily.   multivitamin with minerals Tabs tablet Take 1 tablet by mouth daily.   nitroGLYCERIN 0.4 MG SL tablet Commonly known as: NITROSTAT Place 1 tablet (0.4 mg total) under the tongue every 5 (five) minutes as needed for chest pain.   omega-3 acid ethyl esters 1 g capsule Commonly known as: LOVAZA Take 1 capsule (1 g total) by mouth 2 (two) times daily.   omeprazole 20 MG capsule Commonly known as: PRILOSEC Take 1 capsule (20 mg total)  by mouth daily as needed (for acid reflux). Acid reflux.   OneTouch Delica Lancets 68G Misc EVERY DAY   OneTouch Verio test strip Generic drug: glucose blood Test BS QID Dx E11.9   Oxycodone HCl 10 MG Tabs Take 1 tablet (10 mg total) by mouth every 3 (three) hours as needed for severe pain ((score 7 to 10)).   pramipexole 0.75 MG tablet Commonly known as: Mirapex Take 1 tablet (0.75 mg total) by mouth at bedtime.   PROBIOTIC PO Take 1 capsule by mouth daily.   VITAMIN C PO Take 1 tablet by mouth daily.         Follow-up Information     Consuella Lose, MD. Call.   Specialty: Neurosurgery Why: As needed, If symptoms worsen Contact information: [address] [place] [address] [place] [address] [phone]                 Signed: Charlie Pitter 07/20/2021, 4:58 PM

## 2021-07-20 NOTE — Progress Notes (Signed)
Patient alert and oriented, mae's well, voiding adequate amount of urine, swallowing without difficulty, no c/o pain at time of discharge. Patient discharged home with family. Script and discharged instructions given to patient. Patient and family stated understanding of instructions given. Patient has an appointment with Dr. Nundkumar in 2 weeks 

## 2021-07-20 NOTE — Evaluation (Signed)
Physical Therapy Evaluation & Discharge Patient Details Name: Benjamin Hale MRN: [id number] DOB: Aug 14, 1965 Today's Date: 07/20/2021  History of Present Illness  56 y/o male admitted on 07/19/21 following posterolateral cervical fusion at C5-7. PMH: hx of C5-7 ACDF, HTN, DM, COPD, atrial flutter, migraines  Clinical Impression  Patient admitted following above procedure. Patient functioning at modI level with no AD. Educated patient on cervical precautions, brace wear, and progressive mobility program, patient verbalized understanding. No further skilled PT needs identified acutely. No PT follow up recommended at this time.        Recommendations for follow up therapy are one component of a multi-disciplinary discharge planning process, led by the attending physician.  Recommendations may be updated based on patient status, additional functional criteria and insurance authorization.  Follow Up Recommendations No PT follow up    Assistance Recommended at Discharge PRN  Patient can return home with the following       Equipment Recommendations None recommended by PT  Recommendations for Other Services       Functional Status Assessment Patient has had a recent decline in their functional status and demonstrates the ability to make significant improvements in function in a reasonable and predictable amount of time.     Precautions / Restrictions Precautions Precautions: Cervical Precaution Booklet Issued: Yes (comment) Required Braces or Orthoses: Cervical Brace Cervical Brace: Hard collar;At all times Restrictions Weight Bearing Restrictions: No      Mobility  Bed Mobility Overal bed mobility: Modified Independent             General bed mobility comments: increased time and effort    Transfers Overall transfer level: Modified independent Equipment used: None                    Ambulation/Gait Ambulation/Gait assistance: Modified independent  (Device/Increase time) Gait Distance (Feet): 300 Feet Assistive device: None Gait Pattern/deviations: WFL(Within Functional Limits) Gait velocity: decreased Gait velocity interpretation: >2.62 ft/sec, indicative of community ambulatory      Stairs Stairs: Yes Stairs assistance: Modified independent (Device/Increase time) Stair Management: One rail Left, Alternating pattern, Forwards Number of Stairs: 2    Wheelchair Mobility    Modified Rankin (Stroke Patients Only)       Balance Overall balance assessment: No apparent balance deficits (not formally assessed)                                           Pertinent Vitals/Pain Pain Assessment Pain Assessment: Faces Faces Pain Scale: Hurts even more Pain Location: neck Pain Descriptors / Indicators: Grimacing Pain Intervention(s): Monitored during session, Patient requesting pain meds-RN notified    Home Living Family/patient expects to be discharged to:: Private residence Living Arrangements: Spouse/significant other;Children Available Help at Discharge: Family;Available 24 hours/day Type of Home: House Home Access: Stairs to enter Entrance Stairs-Rails: Right Entrance Stairs-Number of Steps: 2   Home Layout: One level Home Equipment: Agricultural consultant (2 wheels);Cane - single point;Shower seat      Prior Function Prior Level of Function : Independent/Modified Independent;Driving;Working/employed                     Hand Dominance        Extremity/Trunk Assessment   Upper Extremity Assessment Upper Extremity Assessment: Defer to OT evaluation    Lower Extremity Assessment Lower Extremity Assessment: Overall WFL for tasks assessed  Cervical / Trunk Assessment Cervical / Trunk Assessment: Neck Surgery  Communication   Communication: No difficulties  Cognition Arousal/Alertness: Awake/alert Behavior During Therapy: WFL for tasks assessed/performed Overall Cognitive Status:  Within Functional Limits for tasks assessed                                          General Comments      Exercises     Assessment/Plan    PT Assessment Patient does not need any further PT services  PT Problem List         PT Treatment Interventions      PT Goals (Current goals can be found in the Care Plan section)  Acute Rehab PT Goals Patient Stated Goal: to go home PT Goal Formulation: All assessment and education complete, DC therapy    Frequency       Co-evaluation               AM-PAC PT "6 Clicks" Mobility  Outcome Measure Help needed turning from your back to your side while in a flat bed without using bedrails?: None Help needed moving from lying on your back to sitting on the side of a flat bed without using bedrails?: None Help needed moving to and from a bed to a chair (including a wheelchair)?: None Help needed standing up from a chair using your arms (e.g., wheelchair or bedside chair)?: None Help needed to walk in hospital room?: None Help needed climbing 3-5 steps with a railing? : None 6 Click Score: 24    End of Session Equipment Utilized During Treatment: Cervical collar Activity Tolerance: Patient tolerated treatment well Patient left: in bed;with call bell/phone within reach Nurse Communication: Mobility status PT Visit Diagnosis: Muscle weakness (generalized) (M62.81)    Time: 9678-9381 PT Time Calculation (min) (ACUTE ONLY): 17 min   Charges:   PT Evaluation $PT Eval Low Complexity: 1 Low          Messina Kosinski A. Dan Humphreys PT, DPT Acute Rehabilitation Services Pager [phone] Office [phone]   Viviann Spare 07/20/2021, 8:18 AM

## 2021-07-20 NOTE — Discharge Instructions (Signed)
Wound Care Remove outer dressing in 2-3 days Leave incision open to air. You may shower. Do not scrub directly on incision.  Do not put any creams, lotions, or ointments on incision. Activity Walk each and every day, increasing distance each day. No lifting greater than 5 lbs.  Avoid excessive neck motion. No driving for 2 weeks; may ride as a passenger locally. Wear neck brace at all times except when showering.  If provided soft collar, may wear for comfort unless otherwise instructed. Diet Resume your normal diet.  Return to Work Will be discussed at you follow up appointment. Call Your Doctor If Any of These Occur Redness, drainage, or swelling at the wound.  Temperature greater than 101 degrees. Severe pain not relieved by pain medication. Increased difficulty swallowing. Incision starts to come apart. Follow Up Appt Call today for appointment in 2 weeks [id number]) or for problems.  If you have any hardware placed in your spine, you will need an x-ray before your appointment.

## 2021-07-20 NOTE — Evaluation (Signed)
Occupational Therapy Evaluation Patient Details Name: Benjamin Hale MRN: [id number] DOB: 06-11-1966 Today's Date: 07/20/2021   History of Present Illness 56 y/o male admitted on 07/19/21 following posterolateral cervical fusion at C5-7. PMH: hx of C5-7 ACDF, HTN, DM, COPD, atrial flutter, migraines   Clinical Impression   Patient admitted for the diagnosis above.  PTA he was working, driving, and needed no assist for ADL/IADL.  Primary deficit is post op discomfort.  Currently he is moving slowly, but is essentially Mod I with all aspects of mobility and ADL completion at a sit/stand level.  Patient is comfortable with his brace, and understands his cervical precaution.  No further needs in the acute setting.        Recommendations for follow up therapy are one component of a multi-disciplinary discharge planning process, led by the attending physician.  Recommendations may be updated based on patient status, additional functional criteria and insurance authorization.   Follow Up Recommendations  No OT follow up    Assistance Recommended at Discharge PRN  Patient can return home with the following      Functional Status Assessment  Patient has not had a recent decline in their functional status  Equipment Recommendations  None recommended by OT    Recommendations for Other Services       Precautions / Restrictions Precautions Precautions: Cervical Precaution Booklet Issued: Yes (comment) Required Braces or Orthoses: Cervical Brace Cervical Brace: Hard collar;At all times Restrictions Weight Bearing Restrictions: No      Mobility Bed Mobility                    Transfers Overall transfer level: Modified independent                        Balance Overall balance assessment: No apparent balance deficits (not formally assessed)                                         ADL either performed or assessed with clinical judgement   ADL  Overall ADL's : Modified independent                                             Vision Baseline Vision/History: 1 Wears glasses Patient Visual Report: No change from baseline       Perception Perception Perception: Within Functional Limits   Praxis Praxis Praxis: Intact    Pertinent Vitals/Pain Pain Assessment Faces Pain Scale: Hurts even more Pain Location: neck Pain Descriptors / Indicators: Grimacing Pain Intervention(s): Patient requesting pain meds-RN notified     Hand Dominance Right   Extremity/Trunk Assessment Upper Extremity Assessment Upper Extremity Assessment: Overall WFL for tasks assessed   Lower Extremity Assessment Lower Extremity Assessment: Overall WFL for tasks assessed   Cervical / Trunk Assessment Cervical / Trunk Assessment: Neck Surgery   Communication Communication Communication: No difficulties   Cognition Arousal/Alertness: Awake/alert Behavior During Therapy: WFL for tasks assessed/performed Overall Cognitive Status: Within Functional Limits for tasks assessed                                       General Comments   VSS  on RA    Exercises  Educated on light shoulder shrugs and rolls to tolerance.     Shoulder Instructions      Home Living Family/patient expects to be discharged to:: Private residence Living Arrangements: Spouse/significant other Available Help at Discharge: Family;Available 24 hours/day Type of Home: House Home Access: Stairs to enter [employer] of Steps: 2 Entrance Stairs-Rails: Right Home Layout: One level     Bathroom Shower/Tub: Producer, television/film/video: Handicapped height     Home Equipment: Agricultural consultant (2 wheels);Cane - single point;Shower seat          Prior Functioning/Environment Prior Level of Function : Independent/Modified Independent;Driving;Working/employed                        OT Problem List: Pain      OT  Treatment/Interventions:      OT Goals(Current goals can be found in the care plan section) Acute Rehab OT Goals Patient Stated Goal: home OT Goal Formulation: With patient Time For Goal Achievement: 07/22/21 Potential to Achieve Goals: Good  OT Frequency:      Co-evaluation              AM-PAC OT "6 Clicks" Daily Activity     Outcome Measure Help from another person eating meals?: None Help from another person taking care of personal grooming?: None Help from another person toileting, which includes using toliet, bedpan, or urinal?: None Help from another person bathing (including washing, rinsing, drying)?: None Help from another person to put on and taking off regular upper body clothing?: None Help from another person to put on and taking off regular lower body clothing?: None 6 Click Score: 24   End of Session Equipment Utilized During Treatment: Cervical collar  Activity Tolerance: Patient tolerated treatment well Patient left: in bed;with call bell/phone within reach  OT Visit Diagnosis: Pain                Time: 0813-0827 OT Time Calculation (min): 14 min Charges:  OT General Charges $OT Visit: 1 Visit OT Evaluation $OT Eval Moderate Complexity: 1 Mod  07/20/2021  RP, OTR/L  Acute Rehabilitation Services  Office:  [phone]   Suzanna Obey 07/20/2021, 8:29 AM

## 2021-07-23 ENCOUNTER — Encounter (HOSPITAL_COMMUNITY): Payer: Self-pay | Admitting: Neurosurgery

## 2021-08-12 DIAGNOSIS — M5412 Radiculopathy, cervical region: Secondary | ICD-10-CM | POA: Diagnosis not present

## 2021-08-16 ENCOUNTER — Encounter: Payer: Self-pay | Admitting: Nurse Practitioner

## 2021-09-09 ENCOUNTER — Telehealth: Payer: Self-pay | Admitting: Nurse Practitioner

## 2021-09-09 MED ORDER — RYBELSUS 3 MG PO TABS: 3.0000 mg | ORAL_TABLET | Freq: Every day | ORAL | 0 refills | Status: DC

## 2021-09-09 NOTE — Telephone Encounter (Signed)
?  Prescription Request ? ?09/09/2021 ? ?Is this a "Controlled Substance" medicine? Rybelsus new rx for 7MG  90 day supply. Sugars have come down to about 180. ? ?Have you seen your PCP in the last 2 weeks? no ? ?If YES, route message to pool  -  If NO, patient needs to be scheduled for appointment. ? ?What is the name of the medication or equipment? Rybelsus 7mg  ? ?Have you contacted your pharmacy to request a refill? No new rx  ? ?Which pharmacy [facility] you [facility] ? ? ?Patient notified that their request is being sent to the clinical staff for review and that they should receive a response within 2 business days.  ? ? ?

## 2021-09-09 NOTE — Telephone Encounter (Signed)
Order sent in pharmacy for  rybelisus ?

## 2021-10-21 ENCOUNTER — Encounter: Payer: Self-pay | Admitting: Nurse Practitioner

## 2021-10-21 ENCOUNTER — Ambulatory Visit: Payer: BC Managed Care – PPO | Admitting: Nurse Practitioner

## 2021-10-21 VITALS — BP 126/76 | HR 62 | Temp 98.5°F | Resp 20 | Ht 70.0 in | Wt 217.0 lb

## 2021-10-21 DIAGNOSIS — Z1211 Encounter for screening for malignant neoplasm of colon: Secondary | ICD-10-CM

## 2021-10-21 DIAGNOSIS — E785 Hyperlipidemia, unspecified: Secondary | ICD-10-CM

## 2021-10-21 DIAGNOSIS — I10 Essential (primary) hypertension: Secondary | ICD-10-CM | POA: Diagnosis not present

## 2021-10-21 DIAGNOSIS — Z125 Encounter for screening for malignant neoplasm of prostate: Secondary | ICD-10-CM | POA: Diagnosis not present

## 2021-10-21 DIAGNOSIS — G2581 Restless legs syndrome: Secondary | ICD-10-CM

## 2021-10-21 DIAGNOSIS — K219 Gastro-esophageal reflux disease without esophagitis: Secondary | ICD-10-CM | POA: Diagnosis not present

## 2021-10-21 DIAGNOSIS — Z1212 Encounter for screening for malignant neoplasm of rectum: Secondary | ICD-10-CM

## 2021-10-21 DIAGNOSIS — Z23 Encounter for immunization: Secondary | ICD-10-CM | POA: Diagnosis not present

## 2021-10-21 DIAGNOSIS — Z6831 Body mass index (BMI) 31.0-31.9, adult: Secondary | ICD-10-CM

## 2021-10-21 DIAGNOSIS — F321 Major depressive disorder, single episode, moderate: Secondary | ICD-10-CM

## 2021-10-21 DIAGNOSIS — E1121 Type 2 diabetes mellitus with diabetic nephropathy: Secondary | ICD-10-CM | POA: Diagnosis not present

## 2021-10-21 DIAGNOSIS — Z79891 Long term (current) use of opiate analgesic: Secondary | ICD-10-CM | POA: Diagnosis not present

## 2021-10-21 LAB — BAYER DCA HB A1C WAIVED: HB A1C (BAYER DCA - WAIVED): 8.5 % — ABNORMAL HIGH (ref 4.8–5.6)

## 2021-10-21 MED ORDER — OMEPRAZOLE 20 MG PO CPDR: 20.0000 mg | DELAYED_RELEASE_CAPSULE | Freq: Every day | ORAL | 1 refills | Status: DC | PRN

## 2021-10-21 MED ORDER — AMLODIPINE BESYLATE 10 MG PO TABS: 10.0000 mg | ORAL_TABLET | Freq: Every day | ORAL | 1 refills | Status: DC

## 2021-10-21 MED ORDER — METOPROLOL TARTRATE 25 MG PO TABS: 25.0000 mg | ORAL_TABLET | Freq: Two times a day (BID) | ORAL | 1 refills | Status: DC

## 2021-10-21 MED ORDER — ESCITALOPRAM OXALATE 10 MG PO TABS: ORAL_TABLET | ORAL | 1 refills | Status: DC

## 2021-10-21 MED ORDER — GLIMEPIRIDE 4 MG PO TABS: 4.0000 mg | ORAL_TABLET | Freq: Every day | ORAL | 1 refills | Status: DC

## 2021-10-21 MED ORDER — RYBELSUS 7 MG PO TABS: 7.0000 mg | ORAL_TABLET | Freq: Every day | ORAL | 1 refills | Status: DC

## 2021-10-21 MED ORDER — FENOFIBRATE 145 MG PO TABS: 145.0000 mg | ORAL_TABLET | Freq: Every day | ORAL | 1 refills | Status: DC

## 2021-10-21 MED ORDER — LISINOPRIL 40 MG PO TABS: 40.0000 mg | ORAL_TABLET | Freq: Every day | ORAL | 1 refills | Status: DC

## 2021-10-21 MED ORDER — PRAMIPEXOLE DIHYDROCHLORIDE 0.75 MG PO TABS: 0.7500 mg | ORAL_TABLET | Freq: Every day | ORAL | 1 refills | Status: DC

## 2021-10-21 MED ORDER — METFORMIN HCL 1000 MG PO TABS: 1000.0000 mg | ORAL_TABLET | Freq: Two times a day (BID) | ORAL | 1 refills | Status: DC

## 2021-10-21 NOTE — Progress Notes (Signed)
? ?Subjective:  ? ? Patient ID: Benjamin Hale, male    DOB: 1965/11/02, 56 y.o.   MRN: [id number] ? ? ?Chief Complaint: medical management of chronic issues  ?  ? ?HPI: ? ?Benjamin Hale is a 56 y.o. who identifies as a male who was assigned male at birth.  ? ?Social history: ?Lives with: wife and son ? ? ? ?Comes in today for follow up of the following chronic medical issues: ? ?1. Essential hypertension ?No c/o chest pain, sob or headache. Does not check blood pressure at home. ?BP Readings from Last 3 Encounters:  ?07/20/21 (!) 148/74  ?07/18/21 132/78  ?07/16/21 (!) 146/82  ? ? ? ?2. Hyperlipidemia with target LDL less than 100 ?Does not watch diet and does little to no exercise. ?Lab Results  ?Component Value Date  ? CHOL 122 04/11/2021  ? HDL 18 (L) 04/11/2021  ? [place] 55 04/11/2021  ? TRIG 316 (H) 04/11/2021  ? CHOLHDL 6.8 (H) 04/11/2021  ? ? ? ?3. Controlled type 2 diabetes mellitus with diabetic nephropathy, without long-term current use of insulin ([place]) ?Fasting blood sugars are some better running below 200 but still high. He has not been watching his diet at all. He has not been taking his rybellisus on a regular basis.  ?Lab Results  ?Component Value Date  ? HGBA1C 8.3 (H) 07/16/2021  ? ? ? ?4. Gastroesophageal reflux disease without esophagitis ?Is on omeprazole daily and that helps with his symptoms. ? ?5. Depression, major, single episode, moderate ([place]) ?Is on lexapro daily. Has valium to take as needed. ? ?  10/21/2021  ?  8:53 AM 07/18/2021  ?  8:11 AM 04/11/2021  ?  9:02 AM  ?Depression screen PHQ 2/9  ?Decreased Interest 0 0 2  ?Down, Depressed, Hopeless 0 0 1  ?PHQ - 2 Score 0 0 3  ?Altered sleeping 0 1 1  ?Tired, decreased energy 0 1 1  ?Change in appetite 0 0 0  ?Feeling bad or failure about yourself  0 0 0  ?Trouble concentrating 0 0 0  ?Moving slowly or fidgety/restless 0 0 0  ?Suicidal thoughts 0 0 0  ?PHQ-9 Score 0 2 5  ?Difficult doing work/chores Not difficult at all Not difficult  at all Not difficult at all  ? ? ? ? ?6. Restless leg syndrome ?Is on mirapex and is doing well. ? ?7. BMI 36.0-36.9 ?No recent weight changes ?Wt Readings from Last 3 Encounters:  ?10/21/21 217 lb (98.4 kg)  ?07/19/21 217 lb (98.4 kg)  ?07/18/21 224 lb (101.6 kg)  ? ?BMI Readings from Last 3 Encounters:  ?10/21/21 31.14 kg/m?  ?07/19/21 31.14 kg/m?  ?07/18/21 32.14 kg/m?  ? ? ? ?New complaints: ?None today ? ?No Known Allergies ?Outpatient Encounter Medications as of 10/21/2021  ?Medication Sig  ? albuterol (VENTOLIN HFA) 108 (90 Base) MCG/ACT inhaler Inhale 2 puffs into the lungs every 6 (six) hours as needed for wheezing or shortness of breath.  ? amLODipine (NORVASC) 10 MG tablet Take 1 tablet (10 mg total) by mouth daily.  ? Ascorbic Acid (VITAMIN C PO) Take 1 tablet by mouth daily.  ? aspirin EC 81 MG tablet Take 81 mg by mouth daily.  ? blood glucose meter kit and supplies Dispense based on patient and insurance preference. Use up to four times daily as directed. (FOR ICD-10 E10.9, E11.9).  ? diazepam (VALIUM) 5 MG tablet TAKE 1 TABLET EVERY 12 HOURS AS NEEDED FOR ANXIETY  ? escitalopram (  LEXAPRO) 10 MG tablet TAKE ONE (1) TABLET EACH DAY  ? fenofibrate (TRICOR) 145 MG tablet Take 1 tablet (145 mg total) by mouth daily.  ? fluticasone (FLONASE) 50 MCG/ACT nasal spray Place 2 sprays into the nose daily as needed for rhinitis or allergies.  ? gabapentin (NEURONTIN) 300 MG capsule Take 1 capsule (300 mg total) by mouth 2 (two) times daily.  ? glimepiride (AMARYL) 4 MG tablet Take 1 tablet (4 mg total) by mouth daily before breakfast.  ? glucose blood (ONETOUCH VERIO) test strip Test BS QID Dx E11.9  ? ibuprofen (ADVIL,MOTRIN) 200 MG tablet Take 600 mg by mouth every 6 (six) hours as needed (For headache or pain.).  ? lisinopril (ZESTRIL) 40 MG tablet Take 1 tablet (40 mg total) by mouth daily.  ? metFORMIN (GLUCOPHAGE) 1000 MG tablet Take 1 tablet (1,000 mg total) by mouth 2 (two) times daily with a meal.  ?  metoprolol tartrate (LOPRESSOR) 25 MG tablet Take 1 tablet (25 mg total) by mouth 2 (two) times daily.  ? Multiple Vitamin (MULTIVITAMIN WITH MINERALS) TABS tablet Take 1 tablet by mouth daily.  ? nitroGLYCERIN (NITROSTAT) 0.4 MG SL tablet Place 1 tablet (0.4 mg total) under the tongue every 5 (five) minutes as needed for chest pain.  ? omega-3 acid ethyl esters (LOVAZA) 1 g capsule Take 1 capsule (1 g total) by mouth 2 (two) times daily.  ? omeprazole (PRILOSEC) 20 MG capsule Take 1 capsule (20 mg total) by mouth daily as needed (for acid reflux). Acid reflux.  ? ONETOUCH DELICA LANCETS 16L MISC EVERY DAY  ? oxyCODONE 10 MG TABS Take 1 tablet (10 mg total) by mouth every 3 (three) hours as needed for severe pain ((score 7 to 10)).  ? pramipexole (MIRAPEX) 0.75 MG tablet Take 1 tablet (0.75 mg total) by mouth at bedtime.  ? Probiotic Product (PROBIOTIC PO) Take 1 capsule by mouth daily.  ? Semaglutide (RYBELSUS) 3 MG TABS Take 3 mg by mouth daily.  ? ?No facility-administered encounter medications on file as of 10/21/2021.  ? ? ?Past Surgical History:  ?Procedure Laterality Date  ? ANTERIOR CERVICAL DECOMP/DISCECTOMY FUSION N/A 09/22/2012  ? Procedure: ANTERIOR CERVICAL DECOMPRESSION/DISCECTOMY FUSION 2 LEVELS;  Surgeon: Floyce Stakes, MD;  Location: [facility];  Service: Neurosurgery;  Laterality: N/A;  Cervical five-six Cervical six-seven Anterior cervical decompression/diskectomy/fusion  ? ATRIAL FLUTTER ABLATION  12/23/2012  ? CTI ablation by Dr Rayann Heman  ? ATRIAL FLUTTER ABLATION N/A 12/23/2012  ? Procedure: ATRIAL FLUTTER ABLATION;  Surgeon: Thompson Grayer, MD;  [facility] CATH LAB;  Service: Cardiovascular;  Laterality: N/A;  ? CARDIAC CATHETERIZATION N/A 11/16/2015  ? Procedure: Left Heart Cath and Coronary Angiography;  Surgeon: Leonie Man, MD;  Location: [place] CV LAB;  Service: Cardiovascular;  Laterality: N/A;  ? CARDIOVERSION N/A 09/23/2012  ? Procedure: CARDIOVERSION;  Surgeon: Renella Cunas, MD;   Location: [place];  Service: Cardiovascular;  Laterality: N/A;  ? POSTERIOR CERVICAL FUSION/FORAMINOTOMY N/A 07/19/2021  ? Procedure: Cervical five- six Cervical six-seven Lateral Mass Fusion;  Surgeon: Consuella Lose, MD;  Location: [place];  Service: Neurosurgery;  Laterality: N/A;  ? TONSILLECTOMY  1973  ? TREATMENT FISTULA ANAL  ~ 2007  ? WISDOM TOOTH EXTRACTION    ? ? ?Family History  ?Problem Relation Age of Onset  ? Lung cancer Mother   ?     died @ 54  ? Brain cancer Father   ?     died @  63  ? Diabetes Maternal Uncle   ? Diabetes Maternal Grandfather   ? ? ? ? ?Controlled substance contract: 10/31/21- toxasure today ? ? ? ? ?Review of Systems  ?Constitutional:  Negative for diaphoresis.  ?Eyes:  Negative for pain.  ?Respiratory:  Negative for shortness of breath.   ?Cardiovascular:  Negative for chest pain, palpitations and leg swelling.  ?Gastrointestinal:  Negative for abdominal pain.  ?Endocrine: Negative for polydipsia.  ?Skin:  Negative for rash.  ?Neurological:  Negative for dizziness, weakness and headaches.  ?Hematological:  Does not bruise/bleed easily.  ?All other systems reviewed and are negative. ? ?   ?Objective:  ? Physical Exam ?Vitals and nursing note reviewed.  ?Constitutional:   ?   Appearance: Normal appearance. He is well-developed.  ?HENT:  ?   Head: Normocephalic.  ?   Nose: Nose normal.  ?   Mouth/Throat:  ?   Mouth: Mucous membranes are moist.  ?   Pharynx: Oropharynx is clear.  ?Eyes:  ?   Pupils: Pupils are equal, round, and reactive to light.  ?Neck:  ?   Thyroid: No thyroid mass or thyromegaly.  ?   Vascular: No carotid bruit or JVD.  ?   Trachea: Phonation normal.  ?Cardiovascular:  ?   Rate and Rhythm: Normal rate and regular rhythm.  ?Pulmonary:  ?   Effort: Pulmonary effort is normal. No respiratory distress.  ?   Breath sounds: Normal breath sounds.  ?Abdominal:  ?   General: Bowel sounds are normal.  ?   Palpations: Abdomen is soft.  ?   Tenderness: There is no abdominal  tenderness.  ?Musculoskeletal:     ?   General: Normal range of motion.  ?   Cervical back: Normal range of motion and neck supple.  ?Lymphadenopathy:  ?   Cervical: No cervical adenopathy.  ?Skin: ?   General:

## 2021-10-21 NOTE — Patient Instructions (Signed)

## 2021-10-21 NOTE — Addendum Note (Signed)
Addended by: Rolena Infante on: 10/21/2021 04:01 PM ? ? Modules accepted: Orders ? ?

## 2021-10-22 LAB — LIPID PANEL
Chol/HDL Ratio: 7.4 ratio — ABNORMAL HIGH (ref 0.0–5.0)
Cholesterol, Total: 126 mg/dL (ref 100–199)
HDL: 17 mg/dL — ABNORMAL LOW (ref >39)
LDL Chol Calc (NIH): 31 mg/dL (ref 0–99)
Triglycerides: 564 mg/dL (ref 0–149)
VLDL Cholesterol Cal: 78 mg/dL — ABNORMAL HIGH (ref 5–40)

## 2021-10-22 LAB — CBC WITH DIFFERENTIAL/PLATELET
Basophils Absolute: 0.1 10*3/uL (ref 0.0–0.2)
Basos: 1 %
EOS (ABSOLUTE): 0.2 10*3/uL (ref 0.0–0.4)
Eos: 3 %
Hematocrit: 44.9 % (ref 37.5–51.0)
Hemoglobin: 15.5 g/dL (ref 13.0–17.7)
Immature Grans (Abs): 0 10*3/uL (ref 0.0–0.1)
Immature Granulocytes: 0 %
Lymphocytes Absolute: 2.4 10*3/uL (ref 0.7–3.1)
Lymphs: 30 %
MCH: 29.5 pg (ref 26.6–33.0)
MCHC: 34.5 g/dL (ref 31.5–35.7)
MCV: 86 fL (ref 79–97)
Monocytes Absolute: 0.7 10*3/uL (ref 0.1–0.9)
Monocytes: 9 %
Neutrophils Absolute: 4.6 10*3/uL (ref 1.4–7.0)
Neutrophils: 57 %
Platelets: 260 10*3/uL (ref 150–450)
RBC: 5.25 x10E6/uL (ref 4.14–5.80)
RDW: 13.5 % (ref 11.6–15.4)
WBC: 8.1 10*3/uL (ref 3.4–10.8)

## 2021-10-22 LAB — CMP14+EGFR
ALT: 53 IU/L — ABNORMAL HIGH (ref 0–44)
AST: 48 IU/L — ABNORMAL HIGH (ref 0–40)
Albumin/Globulin Ratio: 1.8 (ref 1.2–2.2)
Albumin: 4.4 g/dL (ref 3.8–4.9)
Alkaline Phosphatase: 74 IU/L (ref 44–121)
BUN/Creatinine Ratio: 17 (ref 9–20)
BUN: 15 mg/dL (ref 6–24)
Bilirubin Total: 0.8 mg/dL (ref 0.0–1.2)
CO2: 22 mmol/L (ref 20–29)
Calcium: 9.3 mg/dL (ref 8.7–10.2)
Chloride: 98 mmol/L (ref 96–106)
Creatinine, Ser: 0.86 mg/dL (ref 0.76–1.27)
Globulin, Total: 2.5 g/dL (ref 1.5–4.5)
Glucose: 258 mg/dL — ABNORMAL HIGH (ref 70–99)
Potassium: 3.9 mmol/L (ref 3.5–5.2)
Sodium: 136 mmol/L (ref 134–144)
Total Protein: 6.9 g/dL (ref 6.0–8.5)
eGFR: 102 mL/min/{1.73_m2} (ref >59)

## 2021-10-22 LAB — PSA, TOTAL AND FREE
PSA, Free Pct: 23.3 %
PSA, Free: 0.07 ng/mL
Prostate Specific Ag, Serum: 0.3 ng/mL (ref 0.0–4.0)

## 2021-10-25 LAB — TOXASSURE SELECT 13 (MW), URINE

## 2021-11-15 ENCOUNTER — Telehealth: Payer: Self-pay | Admitting: Nurse Practitioner

## 2021-11-15 NOTE — Telephone Encounter (Signed)
Patients wife notified of lab results and verbalized understanding

## 2021-12-29 DIAGNOSIS — L03011 Cellulitis of right finger: Secondary | ICD-10-CM | POA: Diagnosis not present

## 2021-12-30 DIAGNOSIS — M25512 Pain in left shoulder: Secondary | ICD-10-CM | POA: Diagnosis not present

## 2021-12-30 DIAGNOSIS — G8929 Other chronic pain: Secondary | ICD-10-CM | POA: Diagnosis not present

## 2021-12-30 DIAGNOSIS — Z6831 Body mass index (BMI) 31.0-31.9, adult: Secondary | ICD-10-CM | POA: Diagnosis not present

## 2021-12-30 DIAGNOSIS — S129XXD Fracture of neck, unspecified, subsequent encounter: Secondary | ICD-10-CM | POA: Diagnosis not present

## 2022-01-06 ENCOUNTER — Telehealth: Payer: Self-pay

## 2022-01-06 NOTE — Chronic Care Management (AMB) (Signed)
  Care Coordination  Note  01/06/2022 Name: Benjamin Hale MRN: [id number] DOB: 12-11-1965  Benjamin Hale is a 56 y.o. year old male who is a primary care patient of [name], [name]. I reached out to Benjamin Hale by phone today to offer care coordination services.      [name] was given information about Care Coordination services today including:  The Care Coordination services include support from the care team which includes your Nurse Coordinator, Clinical Social Worker, or Pharmacist.  The Care Coordination team is here to help remove barriers to the health concerns and goals most important to you. Care Coordination services are voluntary and the patient may decline or stop services at any time by request to their care team member.   Patient did not agree to participate in care coordination services at this time.  Follow up plan: Patient declines further follow up or participation in care coordination services.   Penne Lash, RMA Care Guide [facility]  [place], [place] [address] Direct Dial: [phone] [email or web address]

## 2022-01-07 ENCOUNTER — Ambulatory Visit (INDEPENDENT_AMBULATORY_CARE_PROVIDER_SITE_OTHER): Payer: BC Managed Care – PPO | Admitting: Orthopaedic Surgery

## 2022-01-07 ENCOUNTER — Ambulatory Visit: Payer: Self-pay

## 2022-01-07 DIAGNOSIS — M25512 Pain in left shoulder: Secondary | ICD-10-CM | POA: Diagnosis not present

## 2022-01-07 DIAGNOSIS — G8929 Other chronic pain: Secondary | ICD-10-CM

## 2022-01-07 NOTE — Progress Notes (Signed)
Office Visit Note   Patient: Benjamin Hale           Date of Birth: 1966-01-18           MRN: [id number] Visit Date: 01/07/2022              Requested by: Benjamin Pierini, [name] [address] [place],  [place] [address] PCP: Benjamin Pierini, [name]   Assessment & Plan: Visit Diagnoses:  1. Chronic left shoulder pain     Plan: Based on findings impression is left shoulder adhesive capsulitis.  It is possible he may have some rotator cuff tendinitis as well.  His diabetes is also not under great control which I explained has significant impact on adhesive capsulitis and how long will affect him.  We will put him on work restrictions, make a referral to outpatient PT, referral to Dr. Denyse Hale for intra-articular cortisone injection.  He will see his PCP about getting his diabetes under better control.  Recheck in 8 weeks.  Follow-Up Instructions: Return in about 8 weeks (around 03/04/2022).   Orders:  Orders Placed This Encounter  Procedures   XR Shoulder Left   No orders of the defined types were placed in this encounter.     Procedures: No procedures performed   Clinical Data: No additional findings.   Subjective: Chief Complaint  Patient presents with   Left Shoulder - Pain    HPI Benjamin Hale a 56 year old gentleman here for left shoulder pain.  Pain radiates into the elbow as well.  Denies any numbness and tingling.  Feels soreness all the time.  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  Works for a Benjamin [employer].  Right-hand-dominant.  Underwent ACDF about 10 years ago.  Has trouble with reaching and twisting. Review of Systems  Constitutional: Negative.   All other systems reviewed and are negative.    Objective: Vital Signs: There were no vitals taken for this visit.  Physical Exam Vitals and nursing note reviewed.  Constitutional:      Appearance: He is well-developed.  HENT:     Head: Normocephalic and  atraumatic.  Eyes:     Pupils: Pupils are equal, round, and reactive to light.  Pulmonary:     Effort: Pulmonary effort is normal.  Abdominal:     Palpations: Abdomen is soft.  Musculoskeletal:        General: Normal Hale of motion.     Cervical back: Neck supple.  Skin:    General: Skin is warm.  Neurological:     Mental Status: He is alert and oriented to person, place, and time.  Psychiatric:        Behavior: Behavior normal.        Thought Content: Thought content normal.        Judgment: Judgment normal.     Ortho Exam Examination of left shoulder shows good strength to manual muscle testing of the cuff.  Mild to moderate restriction in forward flexion external rotation abduction internal rotation.  Slight crepitus to the [facility] joint and tenderness.  Specialty Comments:  No specialty comments available.  Imaging: No results found.   PMFS History: Patient Active Problem List   Diagnosis Date Noted   Chronic left shoulder pain 01/07/2022   Pseudoarthrosis of cervical spine (HCC) 07/19/2021   Restless leg syndrome 01/01/2021   Cervical neuropathic pain 01/01/2021   Depression, major, single episode, moderate (HCC) 06/23/2019   Type 2 diabetes mellitus, controlled (HCC)  05/01/2014   Essential hypertension 05/01/2014   Hyperlipidemia with target LDL less than 100 03/27/2014   Asthma 03/27/2014   GERD (gastroesophageal reflux disease) 03/27/2014   Past Medical History:  Diagnosis Date   Anginal pain (HCC)    Arthritis    "neck" (12/23/2012)   Asthma    Atrial flutter (HCC)    a. s/p cervical spine surgery 4/14 => s/p DCCV;  b.  Echo 09/2012:  Mild LVH, EF 60-65%, Gr 2 DD.c. s/p atrial flutter ablation 12/23/2012 by Dr Benjamin Hale   Cervical disc disease    s/p cervical spine surgery 4/14   COPD (chronic obstructive pulmonary disease) (HCC)    Diabetes mellitus without complication (HCC)    Dysrhythmia    Exertional shortness of breath    GERD (gastroesophageal reflux  disease)    Headache    "weekly" (12/23/2012)   HTN (hypertension)    Hyperlipidemia    Hypogonadism male    Migraines    "once q 3-4 months" (12/23/2012)    Family History  Problem Relation Age of Onset   Lung cancer Mother        died @ 38   Brain cancer Father        died @ 51   Diabetes Maternal Uncle    Diabetes Maternal Grandfather     Past Surgical History:  Procedure Laterality Date   ANTERIOR CERVICAL DECOMP/DISCECTOMY FUSION N/A 09/22/2012   Procedure: ANTERIOR CERVICAL DECOMPRESSION/DISCECTOMY FUSION 2 LEVELS;  Surgeon: Benjamin Cassis, MD;  Location: [facility];  Service: Neurosurgery;  Laterality: N/A;  Cervical five-six Cervical six-seven Anterior cervical decompression/diskectomy/fusion   ATRIAL FLUTTER ABLATION  12/23/2012   CTI ablation by Dr Benjamin Hale   ATRIAL FLUTTER ABLATION N/A 12/23/2012   Procedure: ATRIAL FLUTTER ABLATION;  Surgeon: Benjamin Range, MD;  Location: [facility] CATH LAB;  Service: Cardiovascular;  Laterality: N/A;   CARDIAC CATHETERIZATION N/A 11/16/2015   Procedure: Left Heart Cath and Coronary Angiography;  Surgeon: Benjamin Lex, MD;  Location: [facility] INVASIVE CV LAB;  Service: Cardiovascular;  Laterality: N/A;   CARDIOVERSION N/A 09/23/2012   Procedure: CARDIOVERSION;  Surgeon: Benjamin Shih, MD;  Location: [facility] OR;  Service: Cardiovascular;  Laterality: N/A;   POSTERIOR CERVICAL FUSION/FORAMINOTOMY N/A 07/19/2021   Procedure: Cervical five- six Cervical six-seven Lateral Mass Fusion;  Surgeon: Benjamin Renshaw, MD;  Location: [facility] OR;  Service: Neurosurgery;  Laterality: N/A;   TONSILLECTOMY  1973   TREATMENT FISTULA ANAL  ~ 2007   WISDOM TOOTH EXTRACTION     Social History   Occupational History   Not on file  Tobacco Use   Smoking status: Former    Packs/day: 1.50    Years: 22.00    Total pack years: 33.00    Types: Cigarettes    Quit date: 06/16/2005    Years since quitting: 16.5   Smokeless tobacco: Never  Vaping Use   Vaping Use: Every day   Substance and Sexual Activity   Alcohol use: Not Currently    Comment: rarely   Drug use: No   Sexual activity: Yes

## 2022-01-09 NOTE — Progress Notes (Signed)
   I, Benjamin Hale, LAT, ATC acting as a scribe for Benjamin Graham, MD.  Subjective:    CC: L shoulder pain  HPI: Pt is a 56 y/o male c/o L shoulder pain ongoing since around Dec 2022.  Pt is R-hand dominate. Hx of ACDF about 10 years ago and a C6-7 fusion on 07/19/21. 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 Works for a [employer]. Pt saw Dr. Roda Hale on 01/07/22 dx w/ adhesive capsulitis and possible RC tendonopathy. Pt was given a work note, w/ restrictions, and referred to PT, 1st visit scheduled for 8/2. Pt locates pain to deep within the L GH joint w/ radiating pain to L elbow.  Radiates: yes Mechanical symptoms: no UE Numbness/tingling: yes- whole hand and fingers UE Weakness: yes Aggravates: certain motions Treatments tried: IBU, heat  Pertinent review of Systems: No fevers or chills  Relevant historical information: History of cervical radiculopathy.  Diabetes.  Hyperlipidemia.   Objective:    Vitals:   01/13/22 0755  BP: (!) 158/82  Pulse: 61  SpO2: 97%   General: Well Developed, well nourished, and in no acute distress.   MSK: Left shoulder: Normal-appearing Nontender. Decreased range of motion pain with abduction and external rotation and internal rotation.  Lab and Radiology Results  Procedure: Real-time Ultrasound Guided Injection of left shoulder glenohumeral joint posterior approach Device: Philips Affiniti 50G Images permanently stored and available for review in PACS Verbal informed consent obtained.  Discussed risks and benefits of procedure. Warned about infection, bleeding, hyperglycemia damage to structures among others. Patient expresses understanding and agreement Time-out conducted.   Noted no overlying erythema, induration, or other signs of local infection.   Skin prepped in a sterile fashion.   Local anesthesia: Topical Ethyl chloride.   With sterile technique and under real time ultrasound  guidance: 40 mg of Kenalog and 2 mL of Marcaine injected into glenohumeral joint. Fluid seen entering the joint capsule.   Completed without difficulty   Pain moderately  resolved suggesting accurate placement of the medication.   Advised to call if fevers/chills, erythema, induration, drainage, or persistent bleeding.   Images permanently stored and available for review in the ultrasound unit.  Impression: Technically successful ultrasound guided injection.       Impression and Recommendations:    Assessment and Plan: 56 y.o. male with left shoulder pain thought to be due to adhesive capsulitis.  Plan for steroid injection today and physical therapy as ordered by Dr Benjamin Hale.  Check back as needed.Marland Kitchen  PDMP not reviewed this encounter. Orders Placed This Encounter  Procedures   [place] LIMITED JOINT SPACE STRUCTURES UP RIGHT(NO LINKED CHARGES)    Order Specific Question:   Reason for Exam (SYMPTOM  OR DIAGNOSIS REQUIRED)    Answer:   right shoulder pain    Order Specific Question:   Preferred imaging location?    Answer:   [facility]   No orders of the defined types were placed in this encounter.   Discussed warning signs or symptoms. Please see discharge instructions. Patient expresses understanding.   The above documentation has been reviewed and is accurate and complete Benjamin Hale, M.D.

## 2022-01-11 DIAGNOSIS — S6701XA Crushing injury of right thumb, initial encounter: Secondary | ICD-10-CM | POA: Diagnosis not present

## 2022-01-11 DIAGNOSIS — Z6830 Body mass index (BMI) 30.0-30.9, adult: Secondary | ICD-10-CM | POA: Diagnosis not present

## 2022-01-11 DIAGNOSIS — L089 Local infection of the skin and subcutaneous tissue, unspecified: Secondary | ICD-10-CM | POA: Diagnosis not present

## 2022-01-13 ENCOUNTER — Ambulatory Visit: Payer: Self-pay

## 2022-01-13 ENCOUNTER — Ambulatory Visit (INDEPENDENT_AMBULATORY_CARE_PROVIDER_SITE_OTHER): Payer: BC Managed Care – PPO | Admitting: Family Medicine

## 2022-01-13 VITALS — BP 158/82 | HR 61 | Ht 70.0 in | Wt 217.6 lb

## 2022-01-13 DIAGNOSIS — M25512 Pain in left shoulder: Secondary | ICD-10-CM

## 2022-01-13 DIAGNOSIS — S6701XA Crushing injury of right thumb, initial encounter: Secondary | ICD-10-CM | POA: Diagnosis not present

## 2022-01-13 DIAGNOSIS — G8929 Other chronic pain: Secondary | ICD-10-CM | POA: Diagnosis not present

## 2022-01-13 DIAGNOSIS — M1811 Unilateral primary osteoarthritis of first carpometacarpal joint, right hand: Secondary | ICD-10-CM | POA: Diagnosis not present

## 2022-01-13 DIAGNOSIS — Z6831 Body mass index (BMI) 31.0-31.9, adult: Secondary | ICD-10-CM | POA: Diagnosis not present

## 2022-01-13 NOTE — Patient Instructions (Addendum)
Thank you for coming in today.   You received an injection today. Seek immediate medical attention if the joint becomes red, extremely painful, or is oozing fluid.   Recheck as needed.    

## 2022-01-15 ENCOUNTER — Other Ambulatory Visit: Payer: Self-pay

## 2022-01-15 ENCOUNTER — Ambulatory Visit: Payer: BC Managed Care – PPO | Attending: Orthopaedic Surgery

## 2022-01-15 DIAGNOSIS — G8929 Other chronic pain: Secondary | ICD-10-CM | POA: Insufficient documentation

## 2022-01-15 DIAGNOSIS — M25512 Pain in left shoulder: Secondary | ICD-10-CM | POA: Diagnosis not present

## 2022-01-15 DIAGNOSIS — M25612 Stiffness of left shoulder, not elsewhere classified: Secondary | ICD-10-CM | POA: Diagnosis not present

## 2022-01-15 NOTE — Therapy (Signed)
OUTPATIENT PHYSICAL THERAPY SHOULDER EVALUATION   Patient Name: Benjamin Hale MRN: [id number] DOB:[birth year], 56 y.o., male Today's Date: 01/15/2022   PT End of Session - 01/15/22 0812     Visit Number 1    Number of Visits 12    Date for PT Re-Evaluation 04/11/22    PT Start Time 0818    PT Stop Time 0851    PT Time Calculation (min) 33 min    Activity Tolerance Patient tolerated treatment well    Behavior During Therapy [facility] for tasks assessed/performed             Past Medical History:  Diagnosis Date   Anginal pain (HCC)    Arthritis    "neck" (12/23/2012)   Asthma    Atrial flutter (HCC)    a. s/p cervical spine surgery 4/14 => s/p DCCV;  b.  Echo 09/2012:  Mild LVH, EF 60-65%, Gr 2 DD.c. s/p atrial flutter ablation 12/23/2012 by Dr Johney Frame   Cervical disc disease    s/p cervical spine surgery 4/14   COPD (chronic obstructive pulmonary disease) (HCC)    Diabetes mellitus without complication (HCC)    Dysrhythmia    Exertional shortness of breath    GERD (gastroesophageal reflux disease)    Headache    "weekly" (12/23/2012)   HTN (hypertension)    Hyperlipidemia    Hypogonadism male    Migraines    "once q 3-4 months" (12/23/2012)   Past Surgical History:  Procedure Laterality Date   ANTERIOR CERVICAL DECOMP/DISCECTOMY FUSION N/A 09/22/2012   Procedure: ANTERIOR CERVICAL DECOMPRESSION/DISCECTOMY FUSION 2 LEVELS;  Surgeon: Karn Cassis, MD;  Location: [facility] NEURO ORS;  Service: Neurosurgery;  Laterality: N/A;  Cervical five-six Cervical six-seven Anterior cervical decompression/diskectomy/fusion   ATRIAL FLUTTER ABLATION  12/23/2012   CTI ablation by Dr Johney Frame   ATRIAL FLUTTER ABLATION N/A 12/23/2012   Procedure: ATRIAL FLUTTER ABLATION;  Surgeon: Hillis Range, MD;  Location[facility] CATH LAB;  Service: Cardiovascular;  Laterality: N/A;   CARDIAC CATHETERIZATION N/A 11/16/2015   Procedure: Left Heart Cath and Coronary Angiography;  Surgeon: Marykay Lex, MD;   Location: [facility] INVASIVE CV LAB;  Service: Cardiovascular;  Laterality: N/A;   CARDIOVERSION N/A 09/23/2012   Procedure: CARDIOVERSION;  Surgeon: Gaylord Shih, MD;  Location: [facility] OR;  Service: Cardiovascular;  Laterality: N/A;   POSTERIOR CERVICAL FUSION/FORAMINOTOMY N/A 07/19/2021   Procedure: Cervical five- six Cervical six-seven Lateral Mass Fusion;  Surgeon: Lisbeth Renshaw, MD;  Location: [facility] OR;  Service: Neurosurgery;  Laterality: N/A;   TONSILLECTOMY  1973   TREATMENT FISTULA ANAL  ~ 2007   WISDOM TOOTH EXTRACTION     Patient Active Problem List   Diagnosis Date Noted   Chronic left shoulder pain 01/07/2022   Pseudoarthrosis of cervical spine (HCC) 07/19/2021   Restless leg syndrome 01/01/2021   Cervical neuropathic pain 01/01/2021   Depression, major, single episode, moderate (HCC) 06/23/2019   Type 2 diabetes mellitus, controlled (HCC) 05/01/2014   Essential hypertension 05/01/2014   Hyperlipidemia with target LDL less than 100 03/27/2014   Asthma 03/27/2014   GERD (gastroesophageal reflux disease) 03/27/2014    PCP: [name], [name]  REFERRING PROVIDER: Tarry Kos, MD  REFERRING DIAG: Chronic left shoulder pain  THERAPY DIAG:  Stiffness of left shoulder, not elsewhere classified  Chronic left shoulder pain  Rationale for Evaluation and Treatment Rehabilitation  ONSET DATE: December 2022  SUBJECTIVE:  SUBJECTIVE STATEMENT: Patient notes that he began having left shoulder pain December 2022 when he was 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 He had a cortisone shot yesterday. He is right hand dominant. He has noticed that his pain is slowly getting worse. He has noticed that when his pain gets aggravated that it will take about 5 minutes before his pain with slowly calm down.  He had neck surgery in February 2023 and his neck is still getting better, but he is still having numbness in his left hand.   PERTINENT HISTORY: 2 neck surgeries, slight right shoulder pain  PAIN:  Are you having pain? Yes: NPRS scale: 10/10 Pain location: Left shoulder and upper arm Pain description: sore, but can feel like a tooth pulling Aggravating factors: twisting his arm, reaching, ice  Relieving factors: heat, rest  PRECAUTIONS: None  WEIGHT BEARING RESTRICTIONS No  FALLS:  Has patient fallen in last 6 months? No  LIVING ENVIRONMENT: Lives with: lives with their family Lives in: House/apartment   OCCUPATION: Currently on light duty with no lifting over 5 pounds; Production designer, theatre/television/film at a [employer]  PLOF: Independent  PATIENT GOALS reduced pain, improved motion, be able to reach and do his normal daily activities without being limited by pain  OBJECTIVE:   COGNITION:  Overall cognitive status: Within functional limits for tasks assessed     SENSATION: Patient reports intermittent numbness in his left hand (3-5th fingers)  UPPER EXTREMITY ROM:   Active ROM Right eval Left eval Left PROM eval   Shoulder flexion 155 142   Shoulder extension     Shoulder abduction 150 109; limited by tightness   Shoulder adduction     Shoulder internal rotation To T5  To sacrum; limited by tightness and pain Limited due to tightness   Shoulder external rotation To T3 To occiput; limited by tightness and pain Limited due to pain and tightness  Elbow flexion     Elbow extension     Wrist flexion     Wrist extension     Wrist ulnar deviation     Wrist radial deviation     Wrist pronation     Wrist supination     (Blank rows = not tested)  UPPER EXTREMITY MMT:  MMT Right eval Left eval  Shoulder flexion 4+/5 4+/5  Shoulder extension    Shoulder abduction 4/5 4/5 limited by familiar pain  Shoulder adduction    Shoulder internal rotation 5/5 4/5 slightly sore   Shoulder external rotation 4/5 4/5  Middle trapezius    Lower trapezius    Elbow flexion    Elbow extension    Wrist flexion    Wrist extension    Wrist ulnar deviation    Wrist radial deviation    Wrist pronation    Wrist supination    Grip strength (lbs) 85 50  (Blank rows = not tested)  JOINT MOBILITY TESTING:  Left glenohumeral- posterior: WFL and nonpainful, inferior- mild hypomobility and discomfort  PALPATION:  TTP: left upper trapezius, deltoid (reproduced pain into his elbow), bicep   TODAY'S TREATMENT:     PATIENT EDUCATION: Education details: POC, prognosis, healing Person educated: Patient Education method: Explanation Education comprehension: verbalized understanding   HOME EXERCISE PROGRAM:   ASSESSMENT:  CLINICAL IMPRESSION: Patient is a 56 y.o. male who was seen today for physical therapy evaluation and treatment for left shoulder adhesive capsulitis. He exhibited moderate to high pain severity and irritability with active abduction, internal rotation,  and external rotation active and passive range of motion and strength assessments. Recommend that he continue with skilled physical therapy to address his remaining impairments to return to his prior level of function.    OBJECTIVE IMPAIRMENTS decreased activity tolerance, decreased ROM, decreased strength, hypomobility, impaired sensation, and pain.   ACTIVITY LIMITATIONS carrying, lifting, and reach over head  PARTICIPATION LIMITATIONS: community activity, occupation, and yard work  PERSONAL FACTORS Time since onset of injury/illness/exacerbation and 3+ comorbidities: HTN, asthma, DM  are also affecting patient's functional outcome.   REHAB POTENTIAL: Good  CLINICAL DECISION MAKING: Evolving/moderate complexity  EVALUATION COMPLEXITY: Moderate   GOALS: Goals reviewed with patient? Yes  SHORT TERM GOALS: Target date: 02/05/2022   Patient will be independent with his initial HEP.   Baseline: Goal status: INITIAL  2.  Patient will be able to demonstrate at least 120 degrees of active left shoulder abduction for improved function reaching overhead.  Baseline:  Goal status: INITIAL  LONG TERM GOALS: Target date: 02/26/2022    Patient will be independent with his advanced HEP.  Baseline:  Goal status: INITIAL  2.  Patient will be able to complete his daily activities without his familiar left shoulder pain exceeding 5/10. Baseline:  Goal status: INITIAL  3.  Patient will improve his functional internal rotation to at least the level of L1 for improved function washing his back.  Baseline:  Goal status: INITIAL  4.  Patient will be able to lift at least 10 pounds without being limited by his familiar left shoulder pain.  Baseline:  Goal status: INITIAL   PLAN: PT FREQUENCY: 1-2x/week  PT DURATION: 6 weeks  PLANNED INTERVENTIONS: Therapeutic exercises, Therapeutic activity, Neuromuscular re-education, Patient/Family education, Joint mobilization, Dry Needling, Electrical stimulation, Cryotherapy, Moist heat, Taping, Vasopneumatic device, Manual therapy, and Re-evaluation  PLAN FOR NEXT SESSION: UBE, wall slides, internal rotation stretch, and modalities as needed; provide HEP    Granville Lewis, PT 01/15/2022, 9:37 AM

## 2022-01-17 ENCOUNTER — Encounter: Payer: Self-pay | Admitting: Physical Therapy

## 2022-01-17 ENCOUNTER — Ambulatory Visit: Payer: BC Managed Care – PPO | Admitting: Physical Therapy

## 2022-01-17 DIAGNOSIS — G8929 Other chronic pain: Secondary | ICD-10-CM

## 2022-01-17 DIAGNOSIS — M25612 Stiffness of left shoulder, not elsewhere classified: Secondary | ICD-10-CM

## 2022-01-17 DIAGNOSIS — M25512 Pain in left shoulder: Secondary | ICD-10-CM | POA: Diagnosis not present

## 2022-01-17 NOTE — Therapy (Signed)
OUTPATIENT PHYSICAL THERAPY SHOULDER TREATMENT   Patient Name: Benjamin Hale MRN: [id number] DOB:04-May-1966, 56 y.o., male Today's Date: 01/17/2022   PT End of Session - 01/17/22 0819     Visit Number 2    Number of Visits 12    Date for PT Re-Evaluation 04/11/22    PT Start Time 0817    PT Stop Time 0902    PT Time Calculation (min) 45 min    Activity Tolerance Patient tolerated treatment well    Behavior During Therapy [facility] for tasks assessed/performed             Past Medical History:  Diagnosis Date   Anginal pain (HCC)    Arthritis    "neck" (12/23/2012)   Asthma    Atrial flutter (HCC)    a. s/p cervical spine surgery 4/14 => s/p DCCV;  b.  Echo 09/2012:  Mild LVH, EF 60-65%, Gr 2 DD.c. s/p atrial flutter ablation 12/23/2012 by Dr Johney Frame   Cervical disc disease    s/p cervical spine surgery 4/14   COPD (chronic obstructive pulmonary disease) (HCC)    Diabetes mellitus without complication (HCC)    Dysrhythmia    Exertional shortness of breath    GERD (gastroesophageal reflux disease)    Headache    "weekly" (12/23/2012)   HTN (hypertension)    Hyperlipidemia    Hypogonadism male    Migraines    "once q 3-4 months" (12/23/2012)   Past Surgical History:  Procedure Laterality Date   ANTERIOR CERVICAL DECOMP/DISCECTOMY FUSION N/A 09/22/2012   Procedure: ANTERIOR CERVICAL DECOMPRESSION/DISCECTOMY FUSION 2 LEVELS;  Surgeon: Karn Cassis, MD;  Location: [facility] NEURO ORS;  Service: Neurosurgery;  Laterality: N/A;  Cervical five-six Cervical six-seven Anterior cervical decompression/diskectomy/fusion   ATRIAL FLUTTER ABLATION  12/23/2012   CTI ablation by Dr Johney Frame   ATRIAL FLUTTER ABLATION N/A 12/23/2012   Procedure: ATRIAL FLUTTER ABLATION;  Surgeon: Hillis Range, MD;  Location: [facility] CATH LAB;  Service: Cardiovascular;  Laterality: N/A;   CARDIAC CATHETERIZATION N/A 11/16/2015   Procedure: Left Heart Cath and Coronary Angiography;  Surgeon: Marykay Lex, MD;  Location:  [facility] INVASIVE CV LAB;  Service: Cardiovascular;  Laterality: N/A;   CARDIOVERSION N/A 09/23/2012   Procedure: CARDIOVERSION;  Surgeon: Gaylord Shih, MD;  Location: [facility] OR;  Service: Cardiovascular;  Laterality: N/A;   POSTERIOR CERVICAL FUSION/FORAMINOTOMY N/A 07/19/2021   Procedure: Cervical five- six Cervical six-seven Lateral Mass Fusion;  Surgeon: Lisbeth Renshaw, MD;  Location: [facility] OR;  Service: Neurosurgery;  Laterality: N/A;   TONSILLECTOMY  1973   TREATMENT FISTULA ANAL  ~ 2007   WISDOM TOOTH EXTRACTION     Patient Active Problem List   Diagnosis Date Noted   Chronic left shoulder pain 01/07/2022   Pseudoarthrosis of cervical spine (HCC) 07/19/2021   Restless leg syndrome 01/01/2021   Cervical neuropathic pain 01/01/2021   Depression, major, single episode, moderate (HCC) 06/23/2019   Type 2 diabetes mellitus, controlled (HCC) 05/01/2014   Essential hypertension 05/01/2014   Hyperlipidemia with target LDL less than 100 03/27/2014   Asthma 03/27/2014   GERD (gastroesophageal reflux disease) 03/27/2014    PCP: [name], [name]  REFERRING PROVIDER: Tarry Kos, MD  REFERRING DIAG: Chronic left shoulder pain  THERAPY DIAG:  Stiffness of left shoulder, not elsewhere classified  Chronic left shoulder pain  Rationale for Evaluation and Treatment Rehabilitation  ONSET DATE: December 2022  SUBJECTIVE:  SUBJECTIVE STATEMENT: Reports that he feels after neck surgery that LUE got worse. No symptoms in LUE today other than soreness with exercise. States that cortizone shot may have helped some as he can lay on the LUE more and no sharp pains.  PERTINENT HISTORY: 2 neck surgeries, slight right shoulder pain  PAIN:  Are you having pain? No  PRECAUTIONS: None  WEIGHT BEARING RESTRICTIONS  No   OBJECTIVE:  TODAY'S TREATMENT:                                     EXERCISE LOG  Exercise Repetitions and Resistance Comments  UBE 60 RPM x10 min   UE ranger  Flex x15 reps   Corner stretch 3x30 sec   AAROM IR X20 reps 3 sec holds   AAROM abduction X20 reps   Horizontal abduction Red x20 reps   Wall pushups X20 reps    Blank cell = exercise not performed today   Modalities  Date:  Unattended Estim: Shoulder, Pre-Mod 80-150 hz, 10 mins, Pain  PATIENT EDUCATION: Education details: POC, prognosis, healing Person educated: Patient Education method: Explanation Education comprehension: verbalized understanding   HOME EXERCISE PROGRAM:   ASSESSMENT:  CLINICAL IMPRESSION: Patient presented in clinic with reports of soreness of the L shoulder. Patient progressed to ROM and light strengthening. Great amount of weakness noted in L shoulder with light resistance in horizontal abduction especially. No other complaints of pain reported during treatment. Normal stimulation response noted following removal of the modality.   OBJECTIVE IMPAIRMENTS decreased activity tolerance, decreased ROM, decreased strength, hypomobility, impaired sensation, and pain.   ACTIVITY LIMITATIONS carrying, lifting, and reach over head  PARTICIPATION LIMITATIONS: community activity, occupation, and yard work  PERSONAL FACTORS Time since onset of injury/illness/exacerbation and 3+ comorbidities: HTN, asthma, DM  are also affecting patient's functional outcome.   REHAB POTENTIAL: Good  CLINICAL DECISION MAKING: Evolving/moderate complexity  EVALUATION COMPLEXITY: Moderate   GOALS: Goals reviewed with patient? Yes  SHORT TERM GOALS: Target date: 02/05/2022   Patient will be independent with his initial HEP.  Baseline: Goal status: INITIAL  2.  Patient will be able to demonstrate at least 120 degrees of active left shoulder abduction for improved function reaching overhead.  Baseline:   Goal status: INITIAL  LONG TERM GOALS: Target date: 02/26/2022    Patient will be independent with his advanced HEP.  Baseline:  Goal status: INITIAL  2.  Patient will be able to complete his daily activities without his familiar left shoulder pain exceeding 5/10. Baseline:  Goal status: INITIAL  3.  Patient will improve his functional internal rotation to at least the level of L1 for improved function washing his back.  Baseline:  Goal status: INITIAL  4.  Patient will be able to lift at least 10 pounds without being limited by his familiar left shoulder pain.  Baseline:  Goal status: INITIAL   PLAN: PT FREQUENCY: 1-2x/week  PT DURATION: 6 weeks  PLANNED INTERVENTIONS: Therapeutic exercises, Therapeutic activity, Neuromuscular re-education, Patient/Family education, Joint mobilization, Dry Needling, Electrical stimulation, Cryotherapy, Moist heat, Taping, Vasopneumatic device, Manual therapy, and Re-evaluation  PLAN FOR NEXT SESSION: UBE, wall slides, internal rotation stretch, and modalities as needed; provide HEP    Marvell Fuller, PTA 01/17/2022, 9:16 AM

## 2022-01-20 ENCOUNTER — Encounter: Payer: Self-pay | Admitting: Physical Therapy

## 2022-01-20 ENCOUNTER — Ambulatory Visit: Payer: BC Managed Care – PPO | Admitting: Physical Therapy

## 2022-01-20 DIAGNOSIS — G8929 Other chronic pain: Secondary | ICD-10-CM | POA: Diagnosis not present

## 2022-01-20 DIAGNOSIS — M25612 Stiffness of left shoulder, not elsewhere classified: Secondary | ICD-10-CM | POA: Diagnosis not present

## 2022-01-20 DIAGNOSIS — M25512 Pain in left shoulder: Secondary | ICD-10-CM | POA: Diagnosis not present

## 2022-01-20 NOTE — Therapy (Addendum)
OUTPATIENT PHYSICAL THERAPY SHOULDER TREATMENT   Patient Name: Benjamin Hale MRN: [id number] DOB:1965-12-21, 56 y.o., male Today's Date: 01/20/2022   PT End of Session - 01/20/22 0731     Visit Number 3    Number of Visits 12    Date for PT Re-Evaluation 04/11/22    PT Start Time 0729    PT Stop Time 0820    PT Time Calculation (min) 51 min    Activity Tolerance Patient tolerated treatment well    Behavior During Therapy [facility] for tasks assessed/performed             Past Medical History:  Diagnosis Date   Anginal pain (HCC)    Arthritis    "neck" (12/23/2012)   Asthma    Atrial flutter (HCC)    a. s/p cervical spine surgery 4/14 => s/p DCCV;  b.  Echo 09/2012:  Mild LVH, EF 60-65%, Gr 2 DD.c. s/p atrial flutter ablation 12/23/2012 by Dr Johney Frame   Cervical disc disease    s/p cervical spine surgery 4/14   COPD (chronic obstructive pulmonary disease) (HCC)    Diabetes mellitus without complication (HCC)    Dysrhythmia    Exertional shortness of breath    GERD (gastroesophageal reflux disease)    Headache    "weekly" (12/23/2012)   HTN (hypertension)    Hyperlipidemia    Hypogonadism male    Migraines    "once q 3-4 months" (12/23/2012)   Past Surgical History:  Procedure Laterality Date   ANTERIOR CERVICAL DECOMP/DISCECTOMY FUSION N/A 09/22/2012   Procedure: ANTERIOR CERVICAL DECOMPRESSION/DISCECTOMY FUSION 2 LEVELS;  Surgeon: Karn Cassis, MD;  Location: [facility] NEURO ORS;  Service: Neurosurgery;  Laterality: N/A;  Cervical five-six Cervical six-seven Anterior cervical decompression/diskectomy/fusion   ATRIAL FLUTTER ABLATION  12/23/2012   CTI ablation by Dr Johney Frame   ATRIAL FLUTTER ABLATION N/A 12/23/2012   Procedure: ATRIAL FLUTTER ABLATION;  Surgeon: Hillis Range, MD;  Location: [facility] CATH LAB;  Service: Cardiovascular;  Laterality: N/A;   CARDIAC CATHETERIZATION N/A 11/16/2015   Procedure: Left Heart Cath and Coronary Angiography;  Surgeon: Marykay Lex, MD;  Location:  [facility] INVASIVE CV LAB;  Service: Cardiovascular;  Laterality: N/A;   CARDIOVERSION N/A 09/23/2012   Procedure: CARDIOVERSION;  Surgeon: Gaylord Shih, MD;  Location: [facility] OR;  Service: Cardiovascular;  Laterality: N/A;   POSTERIOR CERVICAL FUSION/FORAMINOTOMY N/A 07/19/2021   Procedure: Cervical five- six Cervical six-seven Lateral Mass Fusion;  Surgeon: Lisbeth Renshaw, MD;  Location: [facility] OR;  Service: Neurosurgery;  Laterality: N/A;   TONSILLECTOMY  1973   TREATMENT FISTULA ANAL  ~ 2007   WISDOM TOOTH EXTRACTION     Patient Active Problem List   Diagnosis Date Noted   Chronic left shoulder pain 01/07/2022   Pseudoarthrosis of cervical spine (HCC) 07/19/2021   Restless leg syndrome 01/01/2021   Cervical neuropathic pain 01/01/2021   Depression, major, single episode, moderate (HCC) 06/23/2019   Type 2 diabetes mellitus, controlled (HCC) 05/01/2014   Essential hypertension 05/01/2014   Hyperlipidemia with target LDL less than 100 03/27/2014   Asthma 03/27/2014   GERD (gastroesophageal reflux disease) 03/27/2014    PCP: [name], [name]  REFERRING PROVIDER: Tarry Kos, MD  REFERRING DIAG: Chronic left shoulder pain  THERAPY DIAG:  Stiffness of left shoulder, not elsewhere classified  Chronic left shoulder pain  Rationale for Evaluation and Treatment Rehabilitation  ONSET DATE: December 2022  SUBJECTIVE:  SUBJECTIVE STATEMENT: Had soreness over the weekend but reports a sharp pain in anterior shoulder around the proximal bicep origin.  PERTINENT HISTORY: 2 neck surgeries, slight right shoulder pain  PAIN:  Are you having pain? Yes: NPRS scale: no number provided/10 Pain location: L anterior shoulder Pain description: sharp, annoying Aggravating factors: None identifiable Relieving  factors: None identifiable    PRECAUTIONS: None  WEIGHT BEARING RESTRICTIONS No   OBJECTIVE:  TODAY'S TREATMENT:                                     EXERCISE LOG  Exercise Repetitions and Resistance Comments  UBE 90 RPM x8 min   UE ranger  Flex x15 reps   Corner stretch 3x30 sec   AAROM IR X20 reps 3 sec holds   AAROM adduction X20 reps   AAROM extension X20 reps   Shoulder flexion 1# LUE x20 reps   Shoulder scaption 1# LUE x20 reps   Shoulder extension LUE red x20 reps   Horizontal abduction Red x20 reps   Wall pushups X20 reps    Blank cell = exercise not performed today   Modalities  Date: 01/20/2022 Unattended Estim: Shoulder, Pre-Mod 80-150 hz, 10 mins, Pain  PATIENT EDUCATION: Education details: POC, prognosis, healing Person educated: Patient Education method: Explanation Education comprehension: verbalized understanding   HOME EXERCISE PROGRAM:   ASSESSMENT:  CLINICAL IMPRESSION: Patient presented in clinic with reports of sharp pain in the anterior shoulder. Patient had no other complaints during therex and less LUE fatigue and trembling with resisted strengthening. Normal stimulation response noted following removal of the modality.  PHYSICAL THERAPY DISCHARGE SUMMARY  Visits from Start of Care: 3  Current functional level related to goals / functional outcomes: Patient was unable to meet his goals for skilled physical therapy.    Remaining deficits: Pain, AROM, and strength    Education / Equipment: HEP    Patient agrees to discharge. Patient goals were not met. Patient is being discharged due to not returning since the last visit.  Candi Leash, PT, DPT    OBJECTIVE IMPAIRMENTS decreased activity tolerance, decreased ROM, decreased strength, hypomobility, impaired sensation, and pain.   ACTIVITY LIMITATIONS carrying, lifting, and reach over head  PARTICIPATION LIMITATIONS: community activity, occupation, and yard work  PERSONAL FACTORS  Time since onset of injury/illness/exacerbation and 3+ comorbidities: HTN, asthma, DM  are also affecting patient's functional outcome.   REHAB POTENTIAL: Good  CLINICAL DECISION MAKING: Evolving/moderate complexity  EVALUATION COMPLEXITY: Moderate   GOALS: Goals reviewed with patient? Yes  SHORT TERM GOALS: Target date: 02/05/2022   Patient will be independent with his initial HEP.  Baseline: Goal status: INITIAL  2.  Patient will be able to demonstrate at least 120 degrees of active left shoulder abduction for improved function reaching overhead.  Baseline:  Goal status: INITIAL  LONG TERM GOALS: Target date: 02/26/2022    Patient will be independent with his advanced HEP.  Baseline:  Goal status: INITIAL  2.  Patient will be able to complete his daily activities without his familiar left shoulder pain exceeding 5/10. Baseline:  Goal status: INITIAL  3.  Patient will improve his functional internal rotation to at least the level of L1 for improved function washing his back.  Baseline:  Goal status: INITIAL  4.  Patient will be able to lift at least 10 pounds without being limited by his familiar left shoulder  pain.  Baseline:  Goal status: INITIAL   PLAN: PT FREQUENCY: 1-2x/week  PT DURATION: 6 weeks  PLANNED INTERVENTIONS: Therapeutic exercises, Therapeutic activity, Neuromuscular re-education, Patient/Family education, Joint mobilization, Dry Needling, Electrical stimulation, Cryotherapy, Moist heat, Taping, Vasopneumatic device, Manual therapy, and Re-evaluation  PLAN FOR NEXT SESSION: UBE, wall slides, internal rotation stretch, and modalities as needed; provide HEP    [employer], PTA 01/20/2022, 8:24 AM

## 2022-01-21 ENCOUNTER — Encounter: Payer: Self-pay | Admitting: Nurse Practitioner

## 2022-01-21 ENCOUNTER — Ambulatory Visit: Payer: BC Managed Care – PPO | Admitting: Nurse Practitioner

## 2022-01-21 VITALS — BP 127/80 | HR 60 | Temp 98.0°F | Resp 20 | Ht 70.0 in | Wt 216.0 lb

## 2022-01-21 DIAGNOSIS — I10 Essential (primary) hypertension: Secondary | ICD-10-CM | POA: Diagnosis not present

## 2022-01-21 DIAGNOSIS — Z23 Encounter for immunization: Secondary | ICD-10-CM | POA: Diagnosis not present

## 2022-01-21 DIAGNOSIS — G2581 Restless legs syndrome: Secondary | ICD-10-CM

## 2022-01-21 DIAGNOSIS — E1121 Type 2 diabetes mellitus with diabetic nephropathy: Secondary | ICD-10-CM

## 2022-01-21 DIAGNOSIS — J4540 Moderate persistent asthma, uncomplicated: Secondary | ICD-10-CM

## 2022-01-21 DIAGNOSIS — E785 Hyperlipidemia, unspecified: Secondary | ICD-10-CM | POA: Diagnosis not present

## 2022-01-21 DIAGNOSIS — K219 Gastro-esophageal reflux disease without esophagitis: Secondary | ICD-10-CM | POA: Diagnosis not present

## 2022-01-21 DIAGNOSIS — F321 Major depressive disorder, single episode, moderate: Secondary | ICD-10-CM

## 2022-01-21 LAB — BAYER DCA HB A1C WAIVED: HB A1C (BAYER DCA - WAIVED): 7.7 % — ABNORMAL HIGH (ref 4.8–5.6)

## 2022-01-21 NOTE — Patient Instructions (Signed)
Restless Legs Syndrome Restless legs syndrome is a condition that causes uncomfortable feelings or sensations in the legs, especially while sitting or lying down. The sensations usually cause an overwhelming urge to move the legs. The arms can also sometimes be affected. The condition can range from mild to severe. The symptoms often interfere with a person's ability to sleep. What are the causes? The cause of this condition is not known. What increases the risk? The following factors may make you more likely to develop this condition: Being older than 50. Pregnancy. Being a woman. In general, the condition is more common in women than in men. A family history of the condition. Having iron deficiency. Overuse of caffeine, nicotine, or alcohol. Certain medical conditions, such as kidney disease, Parkinson's disease, or nerve damage. Certain medicines, such as those for high blood pressure, nausea, colds, allergies, depression, and some heart conditions. What are the signs or symptoms? The main symptom of this condition is uncomfortable sensations in the legs, such as: Pulling. Tingling. Prickling. Throbbing. Crawling. Burning. Usually, the sensations: Affect both sides of the body. Are worse when you sit or lie down. Are worse at night. These may make it difficult to fall asleep. Make you have a strong urge to move your legs. Are temporarily relieved by moving your legs or standing. The arms can also be affected, but this is rare. People who have this condition often have tiredness during the day because of their lack of sleep at night. How is this diagnosed? This condition may be diagnosed based on: Your symptoms. Blood tests. In some cases, you may be monitored in a sleep lab by a specialist (a sleep study). This can detect any disruptions in your sleep. How is this treated? This condition is treated by managing the symptoms. This may include: Lifestyle changes, such as  exercising, using relaxation techniques, and avoiding caffeine, alcohol, or tobacco. Iron supplements. Medicines. Parkinson's medications may be tried first. Anti-seizure medications can also be helpful. Follow these instructions at home: General instructions Take over-the-counter and prescription medicines only as told by your health care provider. Use methods to help relieve the uncomfortable sensations, such as: Massaging your legs. Walking or stretching. Taking a cold or hot bath. Keep all follow-up visits. This is important. Lifestyle     Practice good sleep habits. For example, go to bed and get up at the same time every day. Most adults should get 7-9 hours of sleep each night. Exercise regularly. Try to get at least 30 minutes of exercise most days of the week. Practice ways of relaxing, such as yoga or meditation. Avoid caffeine and alcohol. Do not use any products that contain nicotine or tobacco. These products include cigarettes, chewing tobacco, and vaping devices, such as e-cigarettes. If you need help quitting, ask your health care provider. Where to find more information National Institute of Neurological Disorders and Stroke: www.ninds.nih.gov Contact a health care provider if: Your symptoms get worse or they do not improve with treatment. Summary Restless legs syndrome is a condition that causes uncomfortable feelings or sensations in the legs, especially while sitting or lying down. The symptoms often interfere with your ability to sleep. This condition is treated by managing the symptoms. You may need to make lifestyle changes or take medicines. This information is not intended to replace advice given to you by your health care provider. Make sure you discuss any questions you have with your health care provider. Document Revised: 01/13/2021 Document Reviewed: 01/13/2021 Elsevier Patient Education    2023 [employer] Inc.  

## 2022-01-21 NOTE — Addendum Note (Signed)
Addended by: Cleda Daub on: 01/21/2022 01:30 PM   Modules accepted: Orders

## 2022-01-21 NOTE — Progress Notes (Signed)
 Subjective:    Patient ID: Benjamin Hale, male    DOB: 04/13/1966, 55 y.o.   MRN: [id number]   Chief Complaint: medical management of chronic issues    HPI:  Benjamin Hale is a 55 y.o. who identifies as a male who was assigned male at birth.   Social history: Lives with: wife Work history: [employer]   Comes in today for follow up of the following chronic medical issues:  1. Essential hypertension No c/o chest pain, sob or headache. Does not check blood pressure at home. BP Readings from Last 3 Encounters:  01/13/22 (!) 158/82  10/21/21 126/76  07/20/21 (!) 148/74     2. Hyperlipidemia with target LDL less than 100 Does not watch diet and does no dedicated exercise. Lab Results  Component Value Date   CHOL 126 10/21/2021   HDL 17 (L) 10/21/2021   LDLCALC 31 10/21/2021   TRIG 564 (HH) 10/21/2021   CHOLHDL 7.4 (H) 10/21/2021     3. Controlled type 2 diabetes mellitus with diabetic nephropathy, without long-term current use of insulin (HCC) Fasting blood sugars are running around 120-180. No low blood sugars Lab Results  Component Value Date   HGBA1C 8.5 (H) 10/21/2021     4. Gastroesophageal reflux disease without esophagitis Is on omperazole daily and is doing well.  5. Moderate persistent asthma without complication Denies any wheezing or trouble breathing.  6. Depression, major, single episode, moderate (HCC) Is currently on lexapro and says he is doing ok. His  mother in law recently passed away and  that has caused stressed  7. Restless leg syndrome Is on mirapex nightly and that seems  to work well for him.   New complaints: None today  No Known Allergies Outpatient Encounter Medications as of 01/21/2022  Medication Sig   albuterol (VENTOLIN HFA) 108 (90 Base) MCG/ACT inhaler Inhale 2 puffs into the lungs every 6 (six) hours as needed for wheezing or shortness of breath.   amLODipine (NORVASC) 10 MG tablet Take 1 tablet (10 mg total) by mouth  daily.   Ascorbic Acid (VITAMIN C PO) Take 1 tablet by mouth daily.   aspirin EC 81 MG tablet Take 81 mg by mouth daily.   blood glucose meter kit and supplies Dispense based on patient and insurance preference. Use up to four times daily as directed. (FOR ICD-10 E10.9, E11.9).   diazepam (VALIUM) 5 MG tablet TAKE 1 TABLET EVERY 12 HOURS AS NEEDED FOR ANXIETY   escitalopram (LEXAPRO) 10 MG tablet TAKE ONE (1) TABLET EACH DAY   fenofibrate (TRICOR) 145 MG tablet Take 1 tablet (145 mg total) by mouth daily.   fluticasone (FLONASE) 50 MCG/ACT nasal spray Place 2 sprays into the nose daily as needed for rhinitis or allergies.   gabapentin (NEURONTIN) 300 MG capsule Take 1 capsule (300 mg total) by mouth 2 (two) times daily.   glimepiride (AMARYL) 4 MG tablet Take 1 tablet (4 mg total) by mouth daily before breakfast.   glucose blood (ONETOUCH VERIO) test strip Test BS QID Dx E11.9   ibuprofen (ADVIL,MOTRIN) 200 MG tablet Take 600 mg by mouth every 6 (six) hours as needed (For headache or pain.).   lisinopril (ZESTRIL) 40 MG tablet Take 1 tablet (40 mg total) by mouth daily.   metFORMIN (GLUCOPHAGE) 1000 MG tablet Take 1 tablet (1,000 mg total) by mouth 2 (two) times daily with a meal.   metoprolol tartrate (LOPRESSOR) 25 MG tablet Take 1 tablet (25 mg total)   by mouth 2 (two) times daily.   Multiple Vitamin (MULTIVITAMIN WITH MINERALS) TABS tablet Take 1 tablet by mouth daily.   nitroGLYCERIN (NITROSTAT) 0.4 MG SL tablet Place 1 tablet (0.4 mg total) under the tongue every 5 (five) minutes as needed for chest pain.   omega-3 acid ethyl esters (LOVAZA) 1 g capsule Take 1 capsule (1 g total) by mouth 2 (two) times daily.   omeprazole (PRILOSEC) 20 MG capsule Take 1 capsule (20 mg total) by mouth daily as needed (for acid reflux). Acid reflux.   ONETOUCH DELICA LANCETS 33G MISC EVERY DAY   oxyCODONE 10 MG TABS Take 1 tablet (10 mg total) by mouth every 3 (three) hours as needed for severe pain ((score 7  to 10)). (Patient not taking: Reported on 01/13/2022)   pramipexole (MIRAPEX) 0.75 MG tablet Take 1 tablet (0.75 mg total) by mouth at bedtime.   Probiotic Product (PROBIOTIC PO) Take 1 capsule by mouth daily.   Semaglutide (RYBELSUS) 7 MG TABS Take 7 mg by mouth daily.   No facility-administered encounter medications on file as of 01/21/2022.    Past Surgical History:  Procedure Laterality Date   ANTERIOR CERVICAL DECOMP/DISCECTOMY FUSION N/A 09/22/2012   Procedure: ANTERIOR CERVICAL DECOMPRESSION/DISCECTOMY FUSION 2 LEVELS;  Surgeon: Ernesto M Botero, MD;  Location: [facility];  Service: Neurosurgery;  Laterality: N/A;  Cervical five-six Cervical six-seven Anterior cervical decompression/diskectomy/fusion   ATRIAL FLUTTER ABLATION  12/23/2012   CTI ablation by Dr Allred   ATRIAL FLUTTER ABLATION N/A 12/23/2012   Procedure: ATRIAL FLUTTER ABLATION;  Surgeon: James Allred, MD;  Location: [facility];  Service: Cardiovascular;  Laterality: N/A;   CARDIAC CATHETERIZATION N/A 11/16/2015   Procedure: Left Heart Cath and Coronary Angiography;  Surgeon: David W Harding, MD;  Location: [facility];  Service: Cardiovascular;  Laterality: N/A;   CARDIOVERSION N/A 09/23/2012   Procedure: CARDIOVERSION;  Surgeon: Thomas C Wall, MD;  Location: [facility] OR;  Service: Cardiovascular;  Laterality: N/A;   POSTERIOR CERVICAL FUSION/FORAMINOTOMY N/A 07/19/2021   Procedure: Cervical five- six Cervical six-seven Lateral Mass Fusion;  Surgeon: Nundkumar, Neelesh, MD;  Location: [facility] OR;  Service: Neurosurgery;  Laterality: N/A;   TONSILLECTOMY  1973   TREATMENT FISTULA ANAL  ~ 2007   WISDOM TOOTH EXTRACTION      Family History  Problem Relation Age of Onset   Lung cancer Mother        died @ 65   Brain cancer Father        died @ 72   Diabetes Maternal Uncle    Diabetes Maternal Grandfather       Controlled substance contract: n/a     Review of Systems  Constitutional:  Negative for diaphoresis.  Eyes:   Negative for pain.  Respiratory:  Negative for shortness of breath.   Cardiovascular:  Negative for chest pain, palpitations and leg swelling.  Gastrointestinal:  Negative for abdominal pain.  Endocrine: Negative for polydipsia.  Skin:  Negative for rash.  Neurological:  Negative for dizziness, weakness and headaches.  Hematological:  Does not bruise/bleed easily.  All other systems reviewed and are negative.      Objective:   Physical Exam Vitals and nursing note reviewed.  Constitutional:      Appearance: Normal appearance. He is well-developed.  HENT:     Head: Normocephalic.     Nose: Nose normal.     Mouth/Throat:     Mouth: Mucous membranes are moist.     Pharynx:   Oropharynx is clear.  Eyes:     Pupils: Pupils are equal, round, and reactive to light.  Neck:     Thyroid: No thyroid mass or thyromegaly.     Vascular: No carotid bruit or JVD.     Trachea: Phonation normal.  Cardiovascular:     Rate and Rhythm: Normal rate and regular rhythm.  Pulmonary:     Effort: Pulmonary effort is normal. No respiratory distress.     Breath sounds: Normal breath sounds.  Abdominal:     General: Bowel sounds are normal.     Palpations: Abdomen is soft.     Tenderness: There is no abdominal tenderness.  Musculoskeletal:        General: Normal range of motion.     Cervical back: Normal range of motion and neck supple.  Lymphadenopathy:     Cervical: No cervical adenopathy.  Skin:    General: Skin is warm and dry.  Neurological:     Mental Status: He is alert and oriented to person, place, and time.  Psychiatric:        Behavior: Behavior normal.        Thought Content: Thought content normal.        Judgment: Judgment normal.     BP 127/80   Pulse 60   Temp 98 F (36.7 C) (Temporal)   Resp 20   Ht 5' 10" (1.778 m)   Wt 216 lb (98 kg)   SpO2 92%   BMI 30.99 kg/m   HGBA1c 7.7%     Assessment & Plan:   Benjamin Hale comes in today with chief complaint of  Medical Management of Chronic Issues   Diagnosis and orders addressed:  1. Essential hypertension Low sodium diet - CBC with Differential/Platelet - CMP14+EGFR  2. Hyperlipidemia with target LDL less than 100 Low fat diet - Lipid panel  3. Controlled type 2 diabetes mellitus with diabetic nephropathy, without long-term current use of insulin (HCC) Continue to watch carbs in diet - Bayer DCA Hb A1c Waived  4. Gastroesophageal reflux disease without esophagitis Avoid spicy foods Do not eat 2 hours prior to bedtime  5. Moderate persistent asthma without complication Report any issues  6. Depression, major, single episode, moderate (HCC) Stress management  7. Restless leg syndrome Keep legs warm at  night   Labs pending Health Maintenance reviewed Diet and exercise encouraged  Follow up plan: 3 months   Taliaferro, [name]

## 2022-01-22 LAB — CBC WITH DIFFERENTIAL/PLATELET
Basophils Absolute: 0.1 10*3/uL (ref 0.0–0.2)
Basos: 1 %
EOS (ABSOLUTE): 0.2 10*3/uL (ref 0.0–0.4)
Eos: 2 %
Hematocrit: 46.8 % (ref 37.5–51.0)
Hemoglobin: 16 g/dL (ref 13.0–17.7)
Immature Grans (Abs): 0 10*3/uL (ref 0.0–0.1)
Immature Granulocytes: 0 %
Lymphocytes Absolute: 2.3 10*3/uL (ref 0.7–3.1)
Lymphs: 26 %
MCH: 30.3 pg (ref 26.6–33.0)
MCHC: 34.2 g/dL (ref 31.5–35.7)
MCV: 89 fL (ref 79–97)
Monocytes Absolute: 0.7 10*3/uL (ref 0.1–0.9)
Monocytes: 8 %
Neutrophils Absolute: 5.7 10*3/uL (ref 1.4–7.0)
Neutrophils: 63 %
Platelets: 245 10*3/uL (ref 150–450)
RBC: 5.28 x10E6/uL (ref 4.14–5.80)
RDW: 13.2 % (ref 11.6–15.4)
WBC: 8.9 10*3/uL (ref 3.4–10.8)

## 2022-01-22 LAB — CMP14+EGFR
ALT: 54 IU/L — ABNORMAL HIGH (ref 0–44)
AST: 50 IU/L — ABNORMAL HIGH (ref 0–40)
Albumin/Globulin Ratio: 1.6 (ref 1.2–2.2)
Albumin: 4.4 g/dL (ref 3.8–4.9)
Alkaline Phosphatase: 66 IU/L (ref 44–121)
BUN/Creatinine Ratio: 16 (ref 9–20)
BUN: 15 mg/dL (ref 6–24)
Bilirubin Total: 0.5 mg/dL (ref 0.0–1.2)
CO2: 17 mmol/L — ABNORMAL LOW (ref 20–29)
Calcium: 9.3 mg/dL (ref 8.7–10.2)
Chloride: 100 mmol/L (ref 96–106)
Creatinine, Ser: 0.95 mg/dL (ref 0.76–1.27)
Globulin, Total: 2.7 g/dL (ref 1.5–4.5)
Glucose: 297 mg/dL — ABNORMAL HIGH (ref 70–99)
Potassium: 4.6 mmol/L (ref 3.5–5.2)
Sodium: 138 mmol/L (ref 134–144)
Total Protein: 7.1 g/dL (ref 6.0–8.5)
eGFR: 95 mL/min/{1.73_m2} (ref >59)

## 2022-01-22 LAB — LIPID PANEL
Chol/HDL Ratio: 7.2 ratio — ABNORMAL HIGH (ref 0.0–5.0)
Cholesterol, Total: 137 mg/dL (ref 100–199)
HDL: 19 mg/dL — ABNORMAL LOW (ref >39)
LDL Chol Calc (NIH): 54 mg/dL (ref 0–99)
Triglycerides: 418 mg/dL — ABNORMAL HIGH (ref 0–149)
VLDL Cholesterol Cal: 64 mg/dL — ABNORMAL HIGH (ref 5–40)

## 2022-02-07 ENCOUNTER — Other Ambulatory Visit: Payer: Self-pay | Admitting: Nurse Practitioner

## 2022-02-07 DIAGNOSIS — E785 Hyperlipidemia, unspecified: Secondary | ICD-10-CM

## 2022-03-04 ENCOUNTER — Ambulatory Visit: Payer: BC Managed Care – PPO | Admitting: Orthopaedic Surgery

## 2022-03-19 ENCOUNTER — Encounter: Payer: Self-pay | Admitting: Nurse Practitioner

## 2022-03-19 ENCOUNTER — Ambulatory Visit: Payer: BC Managed Care – PPO | Admitting: Nurse Practitioner

## 2022-03-19 DIAGNOSIS — R051 Acute cough: Secondary | ICD-10-CM | POA: Diagnosis not present

## 2022-03-19 DIAGNOSIS — J069 Acute upper respiratory infection, unspecified: Secondary | ICD-10-CM

## 2022-03-19 MED ORDER — AZITHROMYCIN 250 MG PO TABS: ORAL_TABLET | ORAL | 0 refills | Status: AC

## 2022-03-19 MED ORDER — GUAIFENESIN ER 600 MG PO TB12: 600.0000 mg | ORAL_TABLET | Freq: Two times a day (BID) | ORAL | 0 refills | Status: DC

## 2022-03-19 MED ORDER — PREDNISONE 20 MG PO TABS: 20.0000 mg | ORAL_TABLET | Freq: Every day | ORAL | 0 refills | Status: DC

## 2022-03-19 NOTE — Progress Notes (Signed)
   Virtual Visit  Note Due to COVID-19 pandemic this visit was conducted virtually. This visit type was conducted due to national recommendations for restrictions regarding the COVID-19 Pandemic (e.g. social distancing, sheltering in place) in an effort to limit this patient's exposure and mitigate transmission in our community. All issues noted in this document were discussed and addressed.  A physical exam was not performed with this format.  I connected with Benjamin Hale on 03/19/22 at 2:00 pm  by telephone and verified that I am speaking with the correct person using two identifiers. Benjamin Hale is currently located at home during visit. The provider, Ivy Lynn, NP is located in their office at time of visit.  I discussed the limitations, risks, security and privacy concerns of performing an evaluation and management service by telephone and the availability of in person appointments. I also discussed with the patient that there may be a patient responsible charge related to this service. The patient expressed understanding and agreed to proceed.   History and Present Illness:  Cough This is a new problem. Episode onset: In the past 5 to 6 days. The problem has been gradually worsening. The cough is Productive of sputum. Associated symptoms include chills, a fever, headaches, nasal congestion, postnasal drip, shortness of breath and wheezing. Pertinent negatives include no sore throat. Nothing aggravates the symptoms. He has tried OTC cough suppressant (NyQuil, DayQuil) for the symptoms. The treatment provided no relief.  URI  This is a new problem. The problem has been unchanged. The maximum temperature recorded prior to his arrival was 100.4 - 100.9 F. Associated symptoms include congestion, coughing, headaches, sinus pain and wheezing. Pertinent negatives include no sore throat. He has tried decongestant for the symptoms. The treatment provided no relief.      Review of  Systems  Constitutional:  Positive for chills and fever.  HENT:  Positive for congestion, postnasal drip and sinus pain. Negative for sore throat.   Respiratory:  Positive for cough, shortness of breath and wheezing.   Cardiovascular: Negative.   Neurological:  Positive for headaches.  All other systems reviewed and are negative.    Observations/Objective: Televisit patient not in distress.  Assessment and Plan: Patient presents with symptoms of low-grade fever, cough, congestion and shortness of breath.  Advised patient to -Throughout home COVID-19 test negative Take meds as prescribed - Use a cool mist humidifier  -Use saline nose sprays frequently -Force fluids -For fever or aches or pains- take Tylenol or ibuprofen.   Follow Up Instructions: Follow-up with unresolved symptoms.    I discussed the assessment and treatment plan with the patient. The patient was provided an opportunity to ask questions and all were answered. The patient agreed with the plan and demonstrated an understanding of the instructions.   The patient was advised to call back or seek an in-person evaluation if the symptoms worsen or if the condition fails to improve as anticipated.  The above assessment and management plan was discussed with the patient. The patient verbalized understanding of and has agreed to the management plan. Patient is aware to call the clinic if symptoms persist or worsen. Patient is aware when to return to the clinic for a follow-up visit. Patient educated on when it is appropriate to go to the emergency department.   Time call ended: 2:13 PM  I provided 13 minutes of  non face-to-face time during this encounter.    Ivy Lynn, NP

## 2022-03-27 ENCOUNTER — Other Ambulatory Visit: Payer: Self-pay | Admitting: Nurse Practitioner

## 2022-03-27 DIAGNOSIS — E1121 Type 2 diabetes mellitus with diabetic nephropathy: Secondary | ICD-10-CM

## 2022-04-18 ENCOUNTER — Other Ambulatory Visit: Payer: Self-pay

## 2022-04-18 DIAGNOSIS — E785 Hyperlipidemia, unspecified: Secondary | ICD-10-CM

## 2022-04-18 DIAGNOSIS — E1121 Type 2 diabetes mellitus with diabetic nephropathy: Secondary | ICD-10-CM

## 2022-04-18 DIAGNOSIS — I10 Essential (primary) hypertension: Secondary | ICD-10-CM

## 2022-04-21 ENCOUNTER — Other Ambulatory Visit: Payer: Self-pay | Admitting: Nurse Practitioner

## 2022-04-21 ENCOUNTER — Ambulatory Visit: Payer: BC Managed Care – PPO | Admitting: Nurse Practitioner

## 2022-04-21 ENCOUNTER — Encounter: Payer: Self-pay | Admitting: Nurse Practitioner

## 2022-04-21 VITALS — BP 118/65 | HR 63 | Temp 98.5°F | Resp 20 | Ht 70.0 in | Wt 217.0 lb

## 2022-04-21 DIAGNOSIS — G2581 Restless legs syndrome: Secondary | ICD-10-CM

## 2022-04-21 DIAGNOSIS — I10 Essential (primary) hypertension: Secondary | ICD-10-CM | POA: Diagnosis not present

## 2022-04-21 DIAGNOSIS — E1165 Type 2 diabetes mellitus with hyperglycemia: Secondary | ICD-10-CM

## 2022-04-21 DIAGNOSIS — E785 Hyperlipidemia, unspecified: Secondary | ICD-10-CM | POA: Diagnosis not present

## 2022-04-21 DIAGNOSIS — K219 Gastro-esophageal reflux disease without esophagitis: Secondary | ICD-10-CM

## 2022-04-21 DIAGNOSIS — E1121 Type 2 diabetes mellitus with diabetic nephropathy: Secondary | ICD-10-CM | POA: Diagnosis not present

## 2022-04-21 DIAGNOSIS — F321 Major depressive disorder, single episode, moderate: Secondary | ICD-10-CM

## 2022-04-21 LAB — BAYER DCA HB A1C WAIVED: HB A1C (BAYER DCA - WAIVED): 9.1 % — ABNORMAL HIGH (ref 4.8–5.6)

## 2022-04-21 MED ORDER — LISINOPRIL 40 MG PO TABS: 40.0000 mg | ORAL_TABLET | Freq: Every day | ORAL | 1 refills | Status: DC

## 2022-04-21 MED ORDER — LANTUS SOLOSTAR 100 UNIT/ML ~~LOC~~ SOPN: 20.0000 [IU] | PEN_INJECTOR | Freq: Every day | SUBCUTANEOUS | 3 refills | Status: DC

## 2022-04-21 MED ORDER — OMEPRAZOLE 20 MG PO CPDR: DELAYED_RELEASE_CAPSULE | ORAL | 1 refills | Status: DC

## 2022-04-21 MED ORDER — METOPROLOL TARTRATE 25 MG PO TABS: 25.0000 mg | ORAL_TABLET | Freq: Two times a day (BID) | ORAL | 1 refills | Status: DC

## 2022-04-21 MED ORDER — ESCITALOPRAM OXALATE 10 MG PO TABS: ORAL_TABLET | ORAL | 1 refills | Status: DC

## 2022-04-21 MED ORDER — AMLODIPINE BESYLATE 10 MG PO TABS: 10.0000 mg | ORAL_TABLET | Freq: Every day | ORAL | 1 refills | Status: DC

## 2022-04-21 NOTE — Patient Instructions (Addendum)

## 2022-04-21 NOTE — Progress Notes (Signed)
Subjective:    Patient ID: Benjamin Hale, male    DOB: 1965-12-26, 56 y.o.   MRN: [id number]   Chief Complaint: Medical Management of Chronic Issues    HPI:  Benjamin Hale is a 56 y.o. who identifies as a male who was assigned male at birth.   Social history: Lives with: wife and son Work history: [employer]   Comes in today for follow up of the following chronic medical issues:  1. Controlled type 2 diabetes mellitus with diabetic nephropathy, without long-term current use of insulin (HCC) Fasting blood sugars are running around close to 200. W.e added rybellsus and that is making him nauseous. Lab Results  Component Value Date   HGBA1C 7.7 (H) 01/21/2022     2. Essential hypertension No c/o chest pain, sob or headache. Does not check blood pressure at home. BP Readings from Last 3 Encounters:  01/21/22 127/80  01/13/22 (!) 158/82  10/21/21 126/76     3. Hyperlipidemia with target LDL less than 100 Does not really watch diet and does no dedicated exercise. Lab Results  Component Value Date   CHOL 137 01/21/2022   HDL 19 (L) 01/21/2022   LDLCALC 54 01/21/2022   TRIG 418 (H) 01/21/2022   CHOLHDL 7.2 (H) 01/21/2022     4. Gastroesophageal reflux disease without esophagitis Is on omeprazole daily and is doing well.  5. Depression, major, single episode, moderate ([place]) Is on lexapro and is doing well    04/21/2022   11:01 AM 01/21/2022    9:36 AM 10/21/2021    8:53 AM  Depression screen PHQ 2/9  Decreased Interest 0 0 0  Down, Depressed, Hopeless 0 0 0  PHQ - 2 Score 0 0 0  Altered sleeping 0 0 0  Tired, decreased energy 0 0 0  Change in appetite 0 0 0  Feeling bad or failure about yourself  0 0 0  Trouble concentrating 0 0 0  Moving slowly or fidgety/restless 0 0 0  Suicidal thoughts 0 0 0  PHQ-9 Score 0 0 0  Difficult doing work/chores Not difficult at all Not difficult at all Not difficult at all     6. Restless leg syndrome Use to be on  mirapex. Has not been taking and is doing well.   New complaints: None today  No Known Allergies Outpatient Encounter Medications as of 04/21/2022  Medication Sig   amLODipine (NORVASC) 10 MG tablet TAKE 1 TABLET DAILY   Ascorbic Acid (VITAMIN C PO) Take 1 tablet by mouth daily.   aspirin EC 81 MG tablet Take 81 mg by mouth daily.   blood glucose meter kit and supplies Dispense based on patient and insurance preference. Use up to four times daily as directed. (FOR ICD-10 E10.9, E11.9).   diazepam (VALIUM) 5 MG tablet TAKE 1 TABLET EVERY 12 HOURS AS NEEDED FOR ANXIETY   escitalopram (LEXAPRO) 10 MG tablet TAKE ONE (1) TABLET EACH DAY   fenofibrate (TRICOR) 145 MG tablet Take 1 tablet (145 mg total) by mouth daily.   fluticasone (FLONASE) 50 MCG/ACT nasal spray Place 2 sprays into the nose daily as needed for rhinitis or allergies.   glimepiride (AMARYL) 4 MG tablet TAKE 1 TABLET DAILY BEFORE BREAKFAST   glucose blood (ONETOUCH VERIO) test strip Test BS QID Dx E11.9   guaiFENesin (MUCINEX) 600 MG 12 hr tablet Take 1 tablet (600 mg total) by mouth 2 (two) times daily.   ibuprofen (ADVIL,MOTRIN) 200 MG tablet Take 600  mg by mouth every 6 (six) hours as needed (For headache or pain.).   lisinopril (ZESTRIL) 40 MG tablet TAKE 1 TABLET DAILY   metFORMIN (GLUCOPHAGE) 1000 MG tablet TAKE 1 TABLET TWICE A DAY WITH MEALS   metoprolol tartrate (LOPRESSOR) 25 MG tablet TAKE 1 TABLET TWICE A DAY   Multiple Vitamin (MULTIVITAMIN WITH MINERALS) TABS tablet Take 1 tablet by mouth daily.   nitroGLYCERIN (NITROSTAT) 0.4 MG SL tablet Place 1 tablet (0.4 mg total) under the tongue every 5 (five) minutes as needed for chest pain.   omega-3 acid ethyl esters (LOVAZA) 1 g capsule TAKE 1 CAPSULE TWICE A DAY   omeprazole (PRILOSEC) 20 MG capsule TAKE 1 CAPSULE DAILY AS NEEDED FOR ACID REFLUX   ONETOUCH DELICA LANCETS 23V MISC EVERY DAY   oxyCODONE 10 MG TABS Take 1 tablet (10 mg total) by mouth every 3 (three)  hours as needed for severe pain ((score 7 to 10)).   pramipexole (MIRAPEX) 0.75 MG tablet Take 1 tablet (0.75 mg total) by mouth at bedtime.   predniSONE (DELTASONE) 20 MG tablet Take 1 tablet (20 mg total) by mouth daily with breakfast.   Probiotic Product (PROBIOTIC PO) Take 1 capsule by mouth daily.   RYBELSUS 7 MG TABS TAKE 1 TABLET DAILY   No facility-administered encounter medications on file as of 04/21/2022.    Past Surgical History:  Procedure Laterality Date   ANTERIOR CERVICAL DECOMP/DISCECTOMY FUSION N/A 09/22/2012   Procedure: ANTERIOR CERVICAL DECOMPRESSION/DISCECTOMY FUSION 2 LEVELS;  Surgeon: Floyce Stakes, MD;  Location: [facility];  Service: Neurosurgery;  Laterality: N/A;  Cervical five-six Cervical six-seven Anterior cervical decompression/diskectomy/fusion   ATRIAL FLUTTER ABLATION  12/23/2012   CTI ablation by Dr Rayann Heman   ATRIAL FLUTTER ABLATION N/A 12/23/2012   Procedure: ATRIAL FLUTTER ABLATION;  Surgeon: Thompson Grayer, MD;  Location: [facility] CATH LAB;  Service: Cardiovascular;  Laterality: N/A;   CARDIAC CATHETERIZATION N/A 11/16/2015   Procedure: Left Heart Cath and Coronary Angiography;  Surgeon: Leonie Man, MD;  Location: [place] CV LAB;  Service: Cardiovascular;  Laterality: N/A;   CARDIOVERSION N/A 09/23/2012   Procedure: CARDIOVERSION;  Surgeon: Renella Cunas, MD;  Location: [place];  Service: Cardiovascular;  Laterality: N/A;   POSTERIOR CERVICAL FUSION/FORAMINOTOMY N/A 07/19/2021   Procedure: Cervical five- six Cervical six-seven Lateral Mass Fusion;  Surgeon: Consuella Lose, MD;  Location: [facility];  Service: Neurosurgery;  Laterality: N/A;   TONSILLECTOMY  1973   TREATMENT FISTULA ANAL  ~ 2007   WISDOM TOOTH EXTRACTION      Family History  Problem Relation Age of Onset   Lung cancer Mother        died @ 74   Brain cancer Father        died @ 33   Diabetes Maternal Uncle    Diabetes Maternal Grandfather       Controlled substance contract:  n/a     Review of Systems  Constitutional:  Negative for diaphoresis.  Eyes:  Negative for pain.  Respiratory:  Negative for shortness of breath.   Cardiovascular:  Negative for chest pain, palpitations and leg swelling.  Gastrointestinal:  Negative for abdominal pain.  Endocrine: Negative for polydipsia.  Skin:  Negative for rash.  Neurological:  Negative for dizziness, weakness and headaches.  Hematological:  Does not bruise/bleed easily.  All other systems reviewed and are negative.      Objective:   Physical Exam Vitals and nursing note reviewed.  Constitutional:  Appearance: Normal appearance. He is well-developed.  HENT:     Head: Normocephalic.     Nose: Nose normal.     Mouth/Throat:     Mouth: Mucous membranes are moist.     Pharynx: Oropharynx is clear.  Eyes:     Pupils: Pupils are equal, round, and reactive to light.  Neck:     Thyroid: No thyroid mass or thyromegaly.     Vascular: No carotid bruit or JVD.     Trachea: Phonation normal.  Cardiovascular:     Rate and Rhythm: Normal rate and regular rhythm.  Pulmonary:     Effort: Pulmonary effort is normal. No respiratory distress.     Breath sounds: Normal breath sounds.  Abdominal:     General: Bowel sounds are normal.     Palpations: Abdomen is soft.     Tenderness: There is no abdominal tenderness.  Musculoskeletal:        General: Normal range of motion.     Cervical back: Normal range of motion and neck supple.  Lymphadenopathy:     Cervical: No cervical adenopathy.  Skin:    General: Skin is warm and dry.  Neurological:     Mental Status: He is alert and oriented to person, place, and time.  Psychiatric:        Behavior: Behavior normal.        Thought Content: Thought content normal.        Judgment: Judgment normal.     BP 118/65   Pulse 63   Temp 98.5 F (36.9 C) (Temporal)   Resp 20   Ht _0  (1.778 m)   Wt 217 lb (98.4 kg)   SpO2 95%   BMI 31.14 kg/m   HGBA1c  9.1%      Assessment & Plan:  Benjamin Hale comes in today with chief complaint of Medical Management of Chronic Issues   Diagnosis and orders addressed:  1. Uncontrolled type 2 diabetes mellitus with hyperglycemia (HCC) Strict carb counting Added lantus to meds - Bayer DCA Hb A1c Waived - Microalbumin / creatinine urine ratio - insulin glargine (LANTUS SOLOSTAR) 100 UNIT/ML Solostar Pen; Inject 20 Units into the skin daily.  Dispense: 15 mL; Refill: 3  2. Essential hypertension Low sodium diet - CBC with Differential/Platelet - CMP14+EGFR - lisinopril (ZESTRIL) 40 MG tablet; Take 1 tablet (40 mg total) by mouth daily.  Dispense: 90 tablet; Refill: 1 - amLODipine (NORVASC) 10 MG tablet; Take 1 tablet (10 mg total) by mouth daily.  Dispense: 90 tablet; Refill: 1 - metoprolol tartrate (LOPRESSOR) 25 MG tablet; Take 1 tablet (25 mg total) by mouth 2 (two) times daily.  Dispense: 180 tablet; Refill: 1  3. Hyperlipidemia with target LDL less than 100 Low fat diet - Lipid panel  4. Gastroesophageal reflux disease without esophagitis Avoid spicy foods Do not eat 2 hours prior to bedtime - omeprazole (PRILOSEC) 20 MG capsule; TAKE 1 CAPSULE DAILY AS NEEDED FOR ACID REFLUX  Dispense: 90 capsule; Refill: 1  5. Depression, major, single episode, moderate (HCC) Stress management - escitalopram (LEXAPRO) 10 MG tablet; TAKE ONE (1) TABLET EACH DAY  Dispense: 90 tablet; Refill: 1  6. Restless leg syndrome Keep legs warm at night   Labs pending Health Maintenance reviewed Diet and exercise encouraged  Follow up plan: 1 month   [place], [name]

## 2022-04-22 DIAGNOSIS — E1165 Type 2 diabetes mellitus with hyperglycemia: Secondary | ICD-10-CM

## 2022-04-22 LAB — CMP14+EGFR
ALT: 69 IU/L — ABNORMAL HIGH (ref 0–44)
AST: 63 IU/L — ABNORMAL HIGH (ref 0–40)
Albumin/Globulin Ratio: 1.8 (ref 1.2–2.2)
Albumin: 4.2 g/dL (ref 3.8–4.9)
Alkaline Phosphatase: 76 IU/L (ref 44–121)
BUN/Creatinine Ratio: 19 (ref 9–20)
BUN: 15 mg/dL (ref 6–24)
Bilirubin Total: 0.8 mg/dL (ref 0.0–1.2)
CO2: 20 mmol/L (ref 20–29)
Calcium: 9.2 mg/dL (ref 8.7–10.2)
Chloride: 98 mmol/L (ref 96–106)
Creatinine, Ser: 0.81 mg/dL (ref 0.76–1.27)
Globulin, Total: 2.3 g/dL (ref 1.5–4.5)
Glucose: 271 mg/dL — ABNORMAL HIGH (ref 70–99)
Potassium: 4.3 mmol/L (ref 3.5–5.2)
Sodium: 137 mmol/L (ref 134–144)
Total Protein: 6.5 g/dL (ref 6.0–8.5)
eGFR: 103 mL/min/{1.73_m2} (ref >59)

## 2022-04-22 LAB — CBC WITH DIFFERENTIAL/PLATELET
Basophils Absolute: 0.1 10*3/uL (ref 0.0–0.2)
Basos: 1 %
EOS (ABSOLUTE): 0.3 10*3/uL (ref 0.0–0.4)
Eos: 3 %
Hematocrit: 46.1 % (ref 37.5–51.0)
Hemoglobin: 15.7 g/dL (ref 13.0–17.7)
Immature Grans (Abs): 0 10*3/uL (ref 0.0–0.1)
Immature Granulocytes: 1 %
Lymphocytes Absolute: 2 10*3/uL (ref 0.7–3.1)
Lymphs: 25 %
MCH: 30.1 pg (ref 26.6–33.0)
MCHC: 34.1 g/dL (ref 31.5–35.7)
MCV: 88 fL (ref 79–97)
Monocytes Absolute: 0.7 10*3/uL (ref 0.1–0.9)
Monocytes: 9 %
Neutrophils Absolute: 4.9 10*3/uL (ref 1.4–7.0)
Neutrophils: 61 %
Platelets: 239 10*3/uL (ref 150–450)
RBC: 5.22 x10E6/uL (ref 4.14–5.80)
RDW: 13.4 % (ref 11.6–15.4)
WBC: 8.1 10*3/uL (ref 3.4–10.8)

## 2022-04-22 LAB — LIPID PANEL
Chol/HDL Ratio: 7.4 ratio — ABNORMAL HIGH (ref 0.0–5.0)
Cholesterol, Total: 134 mg/dL (ref 100–199)
HDL: 18 mg/dL — ABNORMAL LOW (ref >39)
Triglycerides: 818 mg/dL (ref 0–149)

## 2022-04-23 ENCOUNTER — Telehealth: Payer: Self-pay

## 2022-04-23 ENCOUNTER — Telehealth: Payer: Self-pay | Admitting: Pharmacist

## 2022-04-23 DIAGNOSIS — E1121 Type 2 diabetes mellitus with diabetic nephropathy: Secondary | ICD-10-CM

## 2022-04-23 MED ORDER — FREESTYLE LIBRE 3 SENSOR MISC: 11 refills | Status: DC

## 2022-04-23 NOTE — Telephone Encounter (Signed)
KeyDemetrios Hale -   PA Case ID: [id number] -   Rx #: [id number]  Need help? Call us at [phone] Status  Sent to [facility]  Lantus SoloStar 100UNIT/ML pen-injectors  Form  [employer] Electronic PA Form [phone] NCPDP)      Original Claim Info  3

## 2022-04-23 NOTE — Telephone Encounter (Signed)
Libre 3 sensor sent to pharmacy to check for coverage

## 2022-04-30 MED ORDER — INSULIN GLARGINE-YFGN 100 UNIT/ML ~~LOC~~ SOLN: 20.0000 [IU] | Freq: Every day | SUBCUTANEOUS | 11 refills | Status: DC

## 2022-04-30 NOTE — Telephone Encounter (Signed)
Insurance would not cover lantus, so changed to semglee- still use 20 u daily  Meds ordered this encounter  Medications   insulin glargine-yfgn (SEMGLEE, YFGN,) 100 UNIT/ML injection    Sig: Inject 0.2 mLs (20 Units total) into the skin daily.    Dispense:  10 mL    Refill:  11    Order Specific Question:   Supervising Provider    Answer:   Arville Care A [[id number]]   Mary-Margaret Daphine Deutscher, [name]

## 2022-04-30 NOTE — Telephone Encounter (Signed)
PA for Lantus Solostar has been denied. Please review and advise.   KeyDemetrios Hale -   PA Case ID: [id number] -   Rx #: [id number] Need help? Call us at [phone] Lantus Solostar denied. Asking to try Semglee first  Coverage is provided in situations when the patient has tried and cannot use the  formulary alternative, Semglee [facility]), due to a formulation difference in the inactive  ingredient (s) [for example, differences in stabilizing agent, buffering agent, and/or  surfactant] which, according to the prescriber, would result in a significant allergy or  serious adverse reaction. Coverage cannot be authorized at this time.   Outcome Deniedon November 13  CaseId:[id number];Status:Denied;Review Type:Prior Auth;Appeal Information: Attention:ATTN: CLINICAL APPEALS DEPARTMENT [employer] [facility] [id number]. [phone] Phone:[phone] [phone]; Important - Please read the below note on eAppeals: Please reference the denial letter for information on the rights for an appeal, rationale for the denial, and how to submit an appeal including if any information is needed to support the appeal. Note about urgent situations - Generally, an urgent situation is one which, in the opinion of the provider, the health of the patient may be in serious jeopardy or may experience pain that cannot be adequately controlled while waiting for a decision on the appeal.; Drug Lantus SoloStar 100UNIT/ML pen-injectors Form [employer] Electronic PA Form (2017 NCPDP) Original Claim Info 70

## 2022-05-01 MED ORDER — BD PEN NEEDLE MICRO U/F 32G X 6 MM MISC: 6 refills | Status: DC

## 2022-05-01 MED ORDER — FREESTYLE LIBRE 3 SENSOR MISC: 11 refills | Status: DC

## 2022-05-01 NOTE — Telephone Encounter (Signed)
Patient already taking Semglee. Advised to continue. Patient verbalized understanding

## 2022-05-01 NOTE — Addendum Note (Signed)
Addended by: Cleda Daub on: 05/01/2022 10:52 AM   Modules accepted: Orders

## 2022-05-20 ENCOUNTER — Ambulatory Visit: Payer: BC Managed Care – PPO | Admitting: Nurse Practitioner

## 2022-05-20 ENCOUNTER — Encounter: Payer: Self-pay | Admitting: Nurse Practitioner

## 2022-05-20 VITALS — BP 125/71 | HR 56 | Temp 98.2°F | Resp 20 | Ht 70.0 in | Wt 219.0 lb

## 2022-05-20 DIAGNOSIS — E1121 Type 2 diabetes mellitus with diabetic nephropathy: Secondary | ICD-10-CM | POA: Diagnosis not present

## 2022-05-20 DIAGNOSIS — E1165 Type 2 diabetes mellitus with hyperglycemia: Secondary | ICD-10-CM | POA: Diagnosis not present

## 2022-05-20 LAB — BAYER DCA HB A1C WAIVED: HB A1C (BAYER DCA - WAIVED): 8 % — ABNORMAL HIGH (ref 4.8–5.6)

## 2022-05-20 MED ORDER — FREESTYLE LIBRE 3 SENSOR MISC: 1 refills | Status: DC

## 2022-05-20 NOTE — Progress Notes (Signed)
Subjective:    Patient ID: Benjamin Hale, male    DOB: [birth year], 56 y.o.   MRN: [id number]   Chief Complaint: diabetes check  HPI Patient was seen on 04/21/22. At that time he had not beed watching diet and doing no exercise. He was on metformin and amaryl. We had added rybellsus and that caused nausea so he stopped taking it. His blood sugars had been running high, over 200. His HGBA1c was 9.1%. we added lantus 20u daily to meds. His insurance would not cover that so we switched him to semglee 20u daily. Blood sugars are going up in the middle of night over 200. Will drop back down around lunch and then will go back up in evening. He has libre and checks blood sugars frequently.    Review of Systems  Constitutional:  Negative for diaphoresis.  Eyes:  Negative for pain.  Respiratory:  Negative for shortness of breath.   Cardiovascular:  Negative for chest pain, palpitations and leg swelling.  Gastrointestinal:  Negative for abdominal pain.  Endocrine: Negative for polydipsia.  Skin:  Negative for rash.  Neurological:  Negative for dizziness, weakness and headaches.  Hematological:  Does not bruise/bleed easily.  All other systems reviewed and are negative.      Objective:   Physical Exam Vitals and nursing note reviewed.  Constitutional:      Appearance: Normal appearance. He is well-developed.  Neck:     Thyroid: No thyroid mass or thyromegaly.     Vascular: No carotid bruit or JVD.     Trachea: Phonation normal.  Cardiovascular:     Rate and Rhythm: Normal rate and regular rhythm.  Pulmonary:     Effort: Pulmonary effort is normal. No respiratory distress.     Breath sounds: Normal breath sounds.  Abdominal:     General: Bowel sounds are normal.     Palpations: Abdomen is soft.     Tenderness: There is no abdominal tenderness.  Musculoskeletal:        General: Normal range of motion.     Cervical back: Normal range of motion and neck supple.  Lymphadenopathy:      Cervical: No cervical adenopathy.  Skin:    General: Skin is warm and dry.  Neurological:     Mental Status: He is alert and oriented to person, place, and time.  Psychiatric:        Behavior: Behavior normal.        Thought Content: Thought content normal.        Judgment: Judgment normal.     BP 125/71   Pulse (!) 56   Temp 98.2 F (36.8 C) (Temporal)   Resp 20   Ht 5\' 10"  (1.778 m)   Wt 219 lb (99.3 kg)   SpO2 97%   BMI 31.42 kg/m   HGBA1c 8.0      Assessment & Plan:   in today with chief complaint of Diabetes   1. Uncontrolled type 2 diabetes mellitus with hyperglycemia, without long-term current use of insulin (HCC) Increase semglee 26 u daily Follow up in 3 months - Bayer DCA Hb A1c Waived    The above assessment and management plan was discussed with the patient. The patient verbalized understanding of and has agreed to the management plan. Patient is aware to call the clinic if symptoms persist or worsen. Patient is aware when to return to the clinic for a follow-up visit. Patient educated on when it is appropriate  to go to the emergency department.   Mary-Margaret Hassell Done, [name]

## 2022-05-20 NOTE — Patient Instructions (Addendum)

## 2022-06-25 ENCOUNTER — Other Ambulatory Visit: Payer: Self-pay | Admitting: Nurse Practitioner

## 2022-06-25 DIAGNOSIS — E1121 Type 2 diabetes mellitus with diabetic nephropathy: Secondary | ICD-10-CM

## 2022-07-03 MED ORDER — INSULIN GLARGINE-YFGN 100 UNIT/ML ~~LOC~~ SOLN: 24.0000 [IU] | Freq: Every day | SUBCUTANEOUS | 1 refills | Status: DC

## 2022-07-03 MED ORDER — INSULIN GLARGINE-YFGN 100 UNIT/ML ~~LOC~~ SOLN: 34.0000 [IU] | Freq: Every day | SUBCUTANEOUS | 1 refills | Status: DC

## 2022-07-08 ENCOUNTER — Telehealth: Payer: Self-pay | Admitting: Nurse Practitioner

## 2022-07-08 MED ORDER — INSULIN GLARGINE-YFGN 100 UNIT/ML ~~LOC~~ SOLN: 24.0000 [IU] | Freq: Every day | SUBCUTANEOUS | 1 refills | Status: DC

## 2022-07-08 NOTE — Telephone Encounter (Signed)
Re-sent to pharmacy.

## 2022-07-08 NOTE — Telephone Encounter (Signed)
Calling for clarification on directions for insulin glargine-yfgn (SEMGLEE, YFGN,) 100 UNIT/ML injection   Please resend with correct directions

## 2022-07-08 NOTE — Telephone Encounter (Signed)
Suppose to do 24u daily according  to note at last visit

## 2022-07-09 ENCOUNTER — Other Ambulatory Visit: Payer: Self-pay

## 2022-07-09 MED ORDER — INSULIN GLARGINE-YFGN 100 UNIT/ML ~~LOC~~ SOLN: 26.0000 [IU] | Freq: Every day | SUBCUTANEOUS | 1 refills | Status: DC

## 2022-07-23 ENCOUNTER — Telehealth: Payer: BC Managed Care – PPO | Admitting: Emergency Medicine

## 2022-07-23 DIAGNOSIS — H9212 Otorrhea, left ear: Secondary | ICD-10-CM

## 2022-07-23 NOTE — Progress Notes (Signed)
Because of the discharge you are having from the ear with a history of diabetes, you will need to have an ear exam to rule out malignant otitis externa.  I feel your condition warrants further evaluation and I recommend that you be seen in a face to face visit so that an ear exam can be performed.   NOTE: There will be NO CHARGE for this eVisit   If you are having a true medical emergency please call 911.      For an urgent face to face visit, [facility] has eight urgent care centers for your convenience:   NEW!! [facility] [place] [facility] [place] Get Driving Directions [phone] [address] [place], [place] [facility] [place] [facility] [place] Get Driving Directions [phone] [place], [place] [address]   [place] [facility]) Get Driving Directions [phone] [address], [place] [address]  [place] [facility] [place] ([place]) Get Driving Directions [phone] [address] [place],  [place]  [address]  [place] [facility] [place] [facility] - at [address] [place] Get Driving Directions  [phone] [address] [place],  [place] [address]   [facility] Get Driving Directions [phone] [place], [place], [place] [address]   [place] [facility] Get Driving Directions  [phone] [address] [place], [place] [address]   [facility] at  Get Driving Directions [phone] [address] [place], [place] [address]  Your MyChart E-visit questionnaire answers were reviewed by a board certified advanced clinical practitioner to complete your personal care plan based on your specific symptoms.  Thank you for using e-Visits.   Approximately 5 minutes was used in reviewing the  patient's chart, questionnaire, prescribing medications, and documentation.

## 2022-07-24 ENCOUNTER — Telehealth: Payer: BC Managed Care – PPO | Admitting: Family Medicine

## 2022-07-24 ENCOUNTER — Encounter: Payer: Self-pay | Admitting: Family Medicine

## 2022-07-24 DIAGNOSIS — J40 Bronchitis, not specified as acute or chronic: Secondary | ICD-10-CM

## 2022-07-24 DIAGNOSIS — J069 Acute upper respiratory infection, unspecified: Secondary | ICD-10-CM | POA: Diagnosis not present

## 2022-07-24 MED ORDER — FLUTICASONE PROPIONATE 50 MCG/ACT NA SUSP: 2.0000 | Freq: Every day | NASAL | 1 refills | Status: DC | PRN

## 2022-07-24 MED ORDER — AMOXICILLIN-POT CLAVULANATE 875-125 MG PO TABS: 1.0000 | ORAL_TABLET | Freq: Two times a day (BID) | ORAL | 0 refills | Status: DC

## 2022-07-24 NOTE — Progress Notes (Signed)
Virtual Visit via MyChart video note  I connected with Benjamin Hale on 07/24/22 at 1343 by video and verified that I am speaking with the correct person using two identifiers. Loreli Dollar is currently located at home and patient are currently with her during visit. The provider, Fransisca Kaufmann Kellis Mcadam, MD is located in their office at time of visit.  Call ended at 1350  I discussed the limitations, risks, security and privacy concerns of performing an evaluation and management service by video and the availability of in person appointments. I also discussed with the patient that there may be a patient responsible charge related to this service. The patient expressed understanding and agreed to proceed.   History and Present Illness: Patient is calling in for 1 week of cough and earache and sinus pressure.  He has been using allergy medicines. He has a low grade fever.  He has sinus issues drainage and headaches.  He is now starting to get congestion in his chest. His wife just got over an illness last week. He tested covid negative at home.   1. Upper respiratory tract infection, unspecified type   2. Bronchitis     Outpatient Encounter Medications as of 07/24/2022  Medication Sig   amoxicillin-clavulanate (AUGMENTIN) 875-125 MG tablet Take 1 tablet by mouth 2 (two) times daily.   amLODipine (NORVASC) 10 MG tablet Take 1 tablet (10 mg total) by mouth daily.   Ascorbic Acid (VITAMIN C PO) Take 1 tablet by mouth daily.   aspirin EC 81 MG tablet Take 81 mg by mouth daily.   blood glucose meter kit and supplies Dispense based on patient and insurance preference. Use up to four times daily as directed. (FOR ICD-10 E10.9, E11.9).   Continuous Blood Gluc Sensor (FREESTYLE LIBRE 3 SENSOR) MISC Place 1 sensor on the skin every 14 days (apply to back of arm). Use to check glucose continuously   diazepam (VALIUM) 5 MG tablet TAKE 1 TABLET EVERY 12 HOURS AS NEEDED FOR ANXIETY   escitalopram  (LEXAPRO) 10 MG tablet TAKE ONE (1) TABLET EACH DAY   fenofibrate (TRICOR) 145 MG tablet Take 1 tablet (145 mg total) by mouth daily.   fluticasone (FLONASE) 50 MCG/ACT nasal spray Place 2 sprays into both nostrils daily as needed for rhinitis or allergies.   glimepiride (AMARYL) 4 MG tablet TAKE 1 TABLET DAILY BEFORE BREAKFAST   glucose blood (ONETOUCH VERIO) test strip Test BS QID Dx E11.9   guaiFENesin (MUCINEX) 600 MG 12 hr tablet Take 1 tablet (600 mg total) by mouth 2 (two) times daily.   ibuprofen (ADVIL,MOTRIN) 200 MG tablet Take 600 mg by mouth every 6 (six) hours as needed (For headache or pain.).   insulin glargine-yfgn (SEMGLEE, YFGN,) 100 UNIT/ML injection Inject 0.26 mLs (26 Units total) into the skin daily.   Insulin Pen Needle (BD PEN NEEDLE MICRO U/F) 32G X 6 MM MISC Use to give Semglee as needed   lisinopril (ZESTRIL) 40 MG tablet Take 1 tablet (40 mg total) by mouth daily.   metFORMIN (GLUCOPHAGE) 1000 MG tablet TAKE 1 TABLET TWICE A DAY WITH MEALS   metoprolol tartrate (LOPRESSOR) 25 MG tablet Take 1 tablet (25 mg total) by mouth 2 (two) times daily.   Multiple Vitamin (MULTIVITAMIN WITH MINERALS) TABS tablet Take 1 tablet by mouth daily.   nitroGLYCERIN (NITROSTAT) 0.4 MG SL tablet Place 1 tablet (0.4 mg total) under the tongue every 5 (five) minutes as needed for chest pain.  omega-3 acid ethyl esters (LOVAZA) 1 g capsule TAKE 1 CAPSULE TWICE A DAY   omeprazole (PRILOSEC) 20 MG capsule TAKE 1 CAPSULE DAILY AS NEEDED FOR ACID REFLUX   ONETOUCH DELICA LANCETS 21H MISC EVERY DAY   Probiotic Product (PROBIOTIC PO) Take 1 capsule by mouth daily.   [DISCONTINUED] fluticasone (FLONASE) 50 MCG/ACT nasal spray Place 2 sprays into the nose daily as needed for rhinitis or allergies.   No facility-administered encounter medications on file as of 07/24/2022.    Review of Systems  Constitutional:  Positive for fever. Negative for chills.  HENT:  Positive for congestion, ear pain,  postnasal drip, rhinorrhea and sinus pressure. Negative for ear discharge, sneezing, sore throat and voice change.   Eyes:  Negative for pain, discharge, redness and visual disturbance.  Respiratory:  Positive for cough. Negative for shortness of breath and wheezing.   Cardiovascular:  Negative for chest pain and leg swelling.  Musculoskeletal:  Positive for myalgias. Negative for gait problem.  Skin:  Negative for rash.  All other systems reviewed and are negative.   Observations/Objective: Patient sounds and looks comfortable and in no acute distress.  Assessment and Plan: Problem List Items Addressed This Visit   None Visit Diagnoses     Upper respiratory tract infection, unspecified type    -  Primary   Relevant Medications   amoxicillin-clavulanate (AUGMENTIN) 875-125 MG tablet   fluticasone (FLONASE) 50 MCG/ACT nasal spray   Bronchitis       Relevant Medications   amoxicillin-clavulanate (AUGMENTIN) 875-125 MG tablet   fluticasone (FLONASE) 50 MCG/ACT nasal spray     Will treat with amoxicillin and fluticasone  Follow up plan: Return if symptoms worsen or fail to improve.     I discussed the assessment and treatment plan with the patient. The patient was provided an opportunity to ask questions and all were answered. The patient agreed with the plan and demonstrated an understanding of the instructions.   The patient was advised to call back or seek an in-person evaluation if the symptoms worsen or if the condition fails to improve as anticipated.  The above assessment and management plan was discussed with the patient. The patient verbalized understanding of and has agreed to the management plan. Patient is aware to call the clinic if symptoms persist or worsen. Patient is aware when to return to the clinic for a follow-up visit. Patient educated on when it is appropriate to go to the emergency department.    I provided 7 minutes of non-face-to-face time during this  encounter.    Worthy Rancher, MD

## 2022-08-06 ENCOUNTER — Other Ambulatory Visit: Payer: Self-pay | Admitting: *Deleted

## 2022-08-06 DIAGNOSIS — J069 Acute upper respiratory infection, unspecified: Secondary | ICD-10-CM

## 2022-08-06 DIAGNOSIS — J40 Bronchitis, not specified as acute or chronic: Secondary | ICD-10-CM

## 2022-08-06 MED ORDER — FLUTICASONE PROPIONATE 50 MCG/ACT NA SUSP: 2.0000 | Freq: Every day | NASAL | 1 refills | Status: DC | PRN

## 2022-08-14 LAB — HM DIABETES EYE EXAM

## 2022-08-18 NOTE — Progress Notes (Unsigned)
Subjective:    Patient ID: Benjamin Hale, male    DOB: 1966-02-02, 57 y.o.   MRN: [id number]   Chief Complaint: medical management of chronic issues     HPI:  Benjamin Hale is a 57 y.o. who identifies as a male who was assigned male at birth.   Social history: Lives with: wife and son Work history: [employer]   Comes in today for follow up of the following chronic medical issues:  1. Essential hypertension No c/o chest pain, sob or headache. Does not check blood pressure at home. BP Readings from Last 3 Encounters:  05/20/22 125/71  04/21/22 118/65  01/21/22 127/80     2. Hyperlipidemia with target LDL less than 100 Does not watch diet and does no dedicated exercise. Lab Results  Component Value Date   CHOL 134 04/21/2022   HDL 18 (L) 04/21/2022   LDLCALC Comment (A) 04/21/2022   TRIG 818 (HH) 04/21/2022   CHOLHDL 7.4 (H) 04/21/2022   The ASCVD Risk score (Arnett DK, et al., 2019) failed to calculate for the following reasons:   The valid HDL cholesterol range is 20 to 100 mg/dL   3. Gastroesophageal reflux disease without esophagitis Is on omeprazole daily and is doing well.  4. Controlled type 2 diabetes mellitus with diabetic nephropathy, without long-term current use of insulin (HCC) Fasting blood sugars are running around 180-200, but will come down throughout the day. His average is 169. Lab Results  Component Value Date   HGBA1C 8.0 (H) 05/20/2022     5. Depression, major, single episode, moderate ([place]) Is on lexapro and is doing well.    08/19/2022    8:06 AM 05/20/2022    8:09 AM 04/21/2022   11:01 AM  Depression screen PHQ 2/9  Decreased Interest 0 0 0  Down, Depressed, Hopeless 0 0 0  PHQ - 2 Score 0 0 0  Altered sleeping 0 0 0  Tired, decreased energy 0 0 0  Change in appetite 0 0 0  Feeling bad or failure about yourself  0 0 0  Trouble concentrating 0 0 0  Moving slowly or fidgety/restless 0 0 0  Suicidal thoughts 0 0 0  PHQ-9 Score  0 0 0  Difficult doing work/chores Not difficult at all Not difficult at all Not difficult at all     6. Restless leg syndrome He still has legs wigggle at night. Mirapex did not work  7. BMI 31.0-31.9,adult No recent weight changes Wt Readings from Last 3 Encounters:  08/19/22 217 lb (98.4 kg)  05/20/22 219 lb (99.3 kg)  04/21/22 217 lb (98.4 kg)   BMI Readings from Last 3 Encounters:  08/19/22 31.14 kg/m  05/20/22 31.42 kg/m  04/21/22 31.14 kg/m     New complaints: Had cervical neck suregry x2 . He takes valuim at night to relax his neck. Would like  to continue that.  No Known Allergies Outpatient Encounter Medications as of 08/19/2022  Medication Sig   amLODipine (NORVASC) 10 MG tablet Take 1 tablet (10 mg total) by mouth daily.   amoxicillin-clavulanate (AUGMENTIN) 875-125 MG tablet Take 1 tablet by mouth 2 (two) times daily.   Ascorbic Acid (VITAMIN C PO) Take 1 tablet by mouth daily.   aspirin EC 81 MG tablet Take 81 mg by mouth daily.   blood glucose meter kit and supplies Dispense based on patient and insurance preference. Use up to four times daily as directed. (FOR ICD-10 E10.9, E11.9).   Continuous  Blood Gluc Sensor (FREESTYLE LIBRE 3 SENSOR) MISC Place 1 sensor on the skin every 14 days (apply to back of arm). Use to check glucose continuously   diazepam (VALIUM) 5 MG tablet TAKE 1 TABLET EVERY 12 HOURS AS NEEDED FOR ANXIETY   escitalopram (LEXAPRO) 10 MG tablet TAKE ONE (1) TABLET EACH DAY   fenofibrate (TRICOR) 145 MG tablet Take 1 tablet (145 mg total) by mouth daily.   fluticasone (FLONASE) 50 MCG/ACT nasal spray Place 2 sprays into both nostrils daily as needed for rhinitis or allergies.   glimepiride (AMARYL) 4 MG tablet TAKE 1 TABLET DAILY BEFORE BREAKFAST   glucose blood (ONETOUCH VERIO) test strip Test BS QID Dx E11.9   guaiFENesin (MUCINEX) 600 MG 12 hr tablet Take 1 tablet (600 mg total) by mouth 2 (two) times daily.   ibuprofen (ADVIL,MOTRIN) 200 MG  tablet Take 600 mg by mouth every 6 (six) hours as needed (For headache or pain.).   insulin glargine-yfgn (SEMGLEE, YFGN,) 100 UNIT/ML injection Inject 0.26 mLs (26 Units total) into the skin daily.   Insulin Pen Needle (BD PEN NEEDLE MICRO U/F) 32G X 6 MM MISC Use to give Semglee as needed   lisinopril (ZESTRIL) 40 MG tablet Take 1 tablet (40 mg total) by mouth daily.   metFORMIN (GLUCOPHAGE) 1000 MG tablet TAKE 1 TABLET TWICE A DAY WITH MEALS   metoprolol tartrate (LOPRESSOR) 25 MG tablet Take 1 tablet (25 mg total) by mouth 2 (two) times daily.   Multiple Vitamin (MULTIVITAMIN WITH MINERALS) TABS tablet Take 1 tablet by mouth daily.   nitroGLYCERIN (NITROSTAT) 0.4 MG SL tablet Place 1 tablet (0.4 mg total) under the tongue every 5 (five) minutes as needed for chest pain.   omega-3 acid ethyl esters (LOVAZA) 1 g capsule TAKE 1 CAPSULE TWICE A DAY   omeprazole (PRILOSEC) 20 MG capsule TAKE 1 CAPSULE DAILY AS NEEDED FOR ACID REFLUX   ONETOUCH DELICA LANCETS [id number] MISC EVERY DAY   Probiotic Product (PROBIOTIC PO) Take 1 capsule by mouth daily.   No facility-administered encounter medications on file as of 08/19/2022.    Past Surgical History:  Procedure Laterality Date   ANTERIOR CERVICAL DECOMP/DISCECTOMY FUSION N/A 09/22/2012   Procedure: ANTERIOR CERVICAL DECOMPRESSION/DISCECTOMY FUSION 2 LEVELS;  Surgeon: Floyce Stakes, MD;  Location: [facility];  Service: Neurosurgery;  Laterality: N/A;  Cervical five-six Cervical six-seven Anterior cervical decompression/diskectomy/fusion   ATRIAL FLUTTER ABLATION  12/23/2012   CTI ablation by Dr Rayann Heman   ATRIAL FLUTTER ABLATION N/A 12/23/2012   Procedure: ATRIAL FLUTTER ABLATION;  Surgeon: Thompson Grayer, MD;  Location: [facility] CATH [facility];  Service: Cardiovascular;  Laterality: N/A;   CARDIAC CATHETERIZATION N/A 11/16/2015   Procedure: Left Heart Cath and Coronary Angiography;  Surgeon: Leonie Man, MD;  Location: [place] CV LAB;  Service: Cardiovascular;   Laterality: N/A;   CARDIOVERSION N/A 09/23/2012   Procedure: CARDIOVERSION;  Surgeon: Renella Cunas, MD;  Location: [place];  Service: Cardiovascular;  Laterality: N/A;   POSTERIOR CERVICAL FUSION/FORAMINOTOMY N/A 07/19/2021   Procedure: Cervical five- six Cervical six-seven Lateral Mass Fusion;  Surgeon: Consuella Lose, MD;  Location: [place];  Service: Neurosurgery;  Laterality: N/A;   TONSILLECTOMY  1973   TREATMENT FISTULA ANAL  ~ 2007   WISDOM TOOTH EXTRACTION      Family History  Problem Relation Age of Onset   Lung cancer Mother        died @ 7   Brain cancer Father  died @ 83   Diabetes Maternal Uncle    Diabetes Maternal Grandfather       Controlled substance contract: 10/22/21     Review of Systems  Constitutional:  Negative for diaphoresis.  Eyes:  Negative for pain.  Respiratory:  Negative for shortness of breath.   Cardiovascular:  Negative for chest pain, palpitations and leg swelling.  Gastrointestinal:  Negative for abdominal pain.  Endocrine: Negative for polydipsia.  Musculoskeletal:  Positive for neck pain.  Skin:  Negative for rash.  Neurological:  Negative for dizziness, weakness and headaches.  Hematological:  Does not bruise/bleed easily.  All other systems reviewed and are negative.      Objective:   Physical Exam Vitals and nursing note reviewed.  Constitutional:      Appearance: Normal appearance. He is well-developed.  HENT:     Head: Normocephalic.     Nose: Nose normal.     Mouth/Throat:     Mouth: Mucous membranes are moist.     Pharynx: Oropharynx is clear.  Eyes:     Pupils: Pupils are equal, round, and reactive to light.  Neck:     Thyroid: No thyroid mass or thyromegaly.     Vascular: No carotid bruit or JVD.     Trachea: Phonation normal.  Cardiovascular:     Rate and Rhythm: Normal rate and regular rhythm.  Pulmonary:     Effort: Pulmonary effort is normal. No respiratory distress.     Breath sounds: Normal breath  sounds.  Abdominal:     General: Bowel sounds are normal.     Palpations: Abdomen is soft.     Tenderness: There is no abdominal tenderness.  Musculoskeletal:        General: Normal range of motion.     Cervical back: Normal range of motion and neck supple.     Comments: FROM of cervical spine- hurts to move in any direction. Grips equal bil  Lymphadenopathy:     Cervical: No cervical adenopathy.  Skin:    General: Skin is warm and dry.  Neurological:     Mental Status: He is alert and oriented to person, place, and time.  Psychiatric:        Behavior: Behavior normal.        Thought Content: Thought content normal.        Judgment: Judgment normal.    BP 134/77   Pulse (!) 56   Temp 98.3 F (36.8 C) (Temporal)   Resp 20   Ht '5\' 10"'$  (1.778 m)   Wt 217 lb (98.4 kg)   SpO2 95%   BMI 31.14 kg/m   Hgba1c 7.4%       Assessment & Plan:  Benjamin Hale in today with chief complaint of Medical Management of Chronic Issues   1. Essential hypertension Low sodium diet - CBC with Differential/Platelet - CMP14+EGFR - amLODipine (NORVASC) 10 MG tablet; Take 1 tablet (10 mg total) by mouth daily.  Dispense: 90 tablet; Refill: 1 - lisinopril (ZESTRIL) 40 MG tablet; Take 1 tablet (40 mg total) by mouth daily.  Dispense: 90 tablet; Refill: 1 - metoprolol tartrate (LOPRESSOR) 25 MG tablet; Take 1 tablet (25 mg total) by mouth 2 (two) times daily.  Dispense: 180 tablet; Refill: 1  2. Hyperlipidemia with target LDL less than 100 Low fat diet encouraged - Lipid panel - LDL Cholesterol, Direct - fenofibrate (TRICOR) 145 MG tablet; Take 1 tablet (145 mg total) by mouth daily.  Dispense: 90 tablet; Refill:  1 - omega-3 acid ethyl esters (LOVAZA) 1 g capsule; Take 1 capsule (1 g total) by mouth 2 (two) times daily.  Dispense: 180 capsule; Refill: 3  3. Gastroesophageal reflux disease without esophagitis Avoid spicy foods Do not eat 2 hours prior to bedtime  - omeprazole  (PRILOSEC) 20 MG capsule; TAKE 1 CAPSULE DAILY AS NEEDED FOR ACID REFLUX  Dispense: 90 capsule; Refill: 1  4. Controlled type 2 diabetes mellitus with diabetic nephropathy, without long-term current use of insulin (HCC) Continue  to watch carbs in diet - Bayer DCA Hb A1c Waived - Microalbumin / creatinine urine ratio - glimepiride (AMARYL) 4 MG tablet; Take 1 tablet (4 mg total) by mouth daily before breakfast.  Dispense: 90 tablet; Refill: 1 - metFORMIN (GLUCOPHAGE) 1000 MG tablet; Take 1 tablet (1,000 mg total) by mouth 2 (two) times daily with a meal.  Dispense: 180 tablet; Refill: 1 - insulin glargine-yfgn (SEMGLEE, YFGN,) 100 UNIT/ML injection; Inject 0.26 mLs (26 Units total) into the skin daily.  Dispense: 30 mL; Refill: 1  5. Depression, major, single episode, moderate (HCC) Stress management - escitalopram (LEXAPRO) 10 MG tablet; TAKE ONE (1) TABLET EACH DAY  Dispense: 90 tablet; Refill: 1  6. Restless leg syndrome Keep legs warm at night  7. BMI 31.0-31.9,adult Discussed diet and exercise for person with BMI >25 Will recheck weight in 3-6 months   8. Cervical neuropathic pain Moist heat Good neck support pillow. - diazepam (VALIUM) 5 MG tablet; TAKE 1 TABLET EVERY 12 HOURS AS NEEDED FOR ANXIETY  Dispense: 30 tablet; Refill: 2    The above assessment and management plan was discussed with the patient. The patient verbalized understanding of and has agreed to the management plan. Patient is aware to call the clinic if symptoms persist or worsen. Patient is aware when to return to the clinic for a follow-up visit. Patient educated on when it is appropriate to go to the emergency department.   Mary-Margaret Hassell Done, [name]

## 2022-08-19 ENCOUNTER — Ambulatory Visit: Payer: BC Managed Care – PPO | Admitting: Nurse Practitioner

## 2022-08-19 ENCOUNTER — Encounter: Payer: Self-pay | Admitting: Nurse Practitioner

## 2022-08-19 VITALS — BP 134/77 | HR 56 | Temp 98.3°F | Resp 20 | Ht 70.0 in | Wt 217.0 lb

## 2022-08-19 DIAGNOSIS — E785 Hyperlipidemia, unspecified: Secondary | ICD-10-CM | POA: Diagnosis not present

## 2022-08-19 DIAGNOSIS — F321 Major depressive disorder, single episode, moderate: Secondary | ICD-10-CM

## 2022-08-19 DIAGNOSIS — K219 Gastro-esophageal reflux disease without esophagitis: Secondary | ICD-10-CM

## 2022-08-19 DIAGNOSIS — M5412 Radiculopathy, cervical region: Secondary | ICD-10-CM

## 2022-08-19 DIAGNOSIS — Z6831 Body mass index (BMI) 31.0-31.9, adult: Secondary | ICD-10-CM

## 2022-08-19 DIAGNOSIS — E1121 Type 2 diabetes mellitus with diabetic nephropathy: Secondary | ICD-10-CM

## 2022-08-19 DIAGNOSIS — I10 Essential (primary) hypertension: Secondary | ICD-10-CM

## 2022-08-19 DIAGNOSIS — E786 Lipoprotein deficiency: Secondary | ICD-10-CM | POA: Diagnosis not present

## 2022-08-19 DIAGNOSIS — Z7984 Long term (current) use of oral hypoglycemic drugs: Secondary | ICD-10-CM

## 2022-08-19 DIAGNOSIS — G2581 Restless legs syndrome: Secondary | ICD-10-CM

## 2022-08-19 LAB — CMP14+EGFR
ALT: 35 IU/L (ref 0–44)
AST: 32 IU/L (ref 0–40)
Albumin/Globulin Ratio: 1.8 (ref 1.2–2.2)
Albumin: 4.5 g/dL (ref 3.8–4.9)
Alkaline Phosphatase: 55 IU/L (ref 44–121)
BUN/Creatinine Ratio: 21 — ABNORMAL HIGH (ref 9–20)
BUN: 19 mg/dL (ref 6–24)
Bilirubin Total: 0.7 mg/dL (ref 0.0–1.2)
CO2: 22 mmol/L (ref 20–29)
Calcium: 9.1 mg/dL (ref 8.7–10.2)
Chloride: 100 mmol/L (ref 96–106)
Creatinine, Ser: 0.9 mg/dL (ref 0.76–1.27)
Globulin, Total: 2.5 g/dL (ref 1.5–4.5)
Glucose: 200 mg/dL — ABNORMAL HIGH (ref 70–99)
Potassium: 4.6 mmol/L (ref 3.5–5.2)
Sodium: 139 mmol/L (ref 134–144)
Total Protein: 7 g/dL (ref 6.0–8.5)
eGFR: 100 mL/min/{1.73_m2} (ref >59)

## 2022-08-19 LAB — CBC WITH DIFFERENTIAL/PLATELET
Basophils Absolute: 0.1 10*3/uL (ref 0.0–0.2)
Basos: 1 %
EOS (ABSOLUTE): 0.2 10*3/uL (ref 0.0–0.4)
Eos: 3 %
Hematocrit: 44.2 % (ref 37.5–51.0)
Hemoglobin: 15.4 g/dL (ref 13.0–17.7)
Immature Grans (Abs): 0 10*3/uL (ref 0.0–0.1)
Immature Granulocytes: 0 %
Lymphocytes Absolute: 2.1 10*3/uL (ref 0.7–3.1)
Lymphs: 28 %
MCH: 29.7 pg (ref 26.6–33.0)
MCHC: 34.8 g/dL (ref 31.5–35.7)
MCV: 85 fL (ref 79–97)
Monocytes Absolute: 0.7 10*3/uL (ref 0.1–0.9)
Monocytes: 9 %
Neutrophils Absolute: 4.6 10*3/uL (ref 1.4–7.0)
Neutrophils: 59 %
Platelets: 245 10*3/uL (ref 150–450)
RBC: 5.19 x10E6/uL (ref 4.14–5.80)
RDW: 12.6 % (ref 11.6–15.4)
WBC: 7.7 10*3/uL (ref 3.4–10.8)

## 2022-08-19 LAB — LIPID PANEL
Chol/HDL Ratio: 6.4 ratio — ABNORMAL HIGH (ref 0.0–5.0)
Cholesterol, Total: 134 mg/dL (ref 100–199)
HDL: 21 mg/dL — ABNORMAL LOW (ref >39)
LDL Chol Calc (NIH): 77 mg/dL (ref 0–99)
Triglycerides: 214 mg/dL — ABNORMAL HIGH (ref 0–149)
VLDL Cholesterol Cal: 36 mg/dL (ref 5–40)

## 2022-08-19 LAB — BAYER DCA HB A1C WAIVED: HB A1C (BAYER DCA - WAIVED): 7.4 % — ABNORMAL HIGH (ref 4.8–5.6)

## 2022-08-19 MED ORDER — METFORMIN HCL 1000 MG PO TABS: 1000.0000 mg | ORAL_TABLET | Freq: Two times a day (BID) | ORAL | 1 refills | Status: DC

## 2022-08-19 MED ORDER — AMLODIPINE BESYLATE 10 MG PO TABS: 10.0000 mg | ORAL_TABLET | Freq: Every day | ORAL | 1 refills | Status: DC

## 2022-08-19 MED ORDER — INSULIN GLARGINE-YFGN 100 UNIT/ML ~~LOC~~ SOLN: 26.0000 [IU] | Freq: Every day | SUBCUTANEOUS | 1 refills | Status: DC

## 2022-08-19 MED ORDER — ESCITALOPRAM OXALATE 10 MG PO TABS: ORAL_TABLET | ORAL | 1 refills | Status: DC

## 2022-08-19 MED ORDER — LISINOPRIL 40 MG PO TABS: 40.0000 mg | ORAL_TABLET | Freq: Every day | ORAL | 1 refills | Status: DC

## 2022-08-19 MED ORDER — FENOFIBRATE 145 MG PO TABS: 145.0000 mg | ORAL_TABLET | Freq: Every day | ORAL | 1 refills | Status: DC

## 2022-08-19 MED ORDER — DIAZEPAM 5 MG PO TABS: ORAL_TABLET | ORAL | 2 refills | Status: DC

## 2022-08-19 MED ORDER — GLIMEPIRIDE 4 MG PO TABS: 4.0000 mg | ORAL_TABLET | Freq: Every day | ORAL | 1 refills | Status: DC

## 2022-08-19 MED ORDER — DIAZEPAM 5 MG PO TABS: ORAL_TABLET | ORAL | 1 refills | Status: DC

## 2022-08-19 MED ORDER — OMEPRAZOLE 20 MG PO CPDR: DELAYED_RELEASE_CAPSULE | ORAL | 1 refills | Status: DC

## 2022-08-19 MED ORDER — METOPROLOL TARTRATE 25 MG PO TABS: 25.0000 mg | ORAL_TABLET | Freq: Two times a day (BID) | ORAL | 1 refills | Status: DC

## 2022-08-19 MED ORDER — OMEGA-3-ACID ETHYL ESTERS 1 G PO CAPS: 1.0000 | ORAL_CAPSULE | Freq: Two times a day (BID) | ORAL | 3 refills | Status: DC

## 2022-08-19 NOTE — Patient Instructions (Signed)
Restless Legs Syndrome Restless legs syndrome is a condition that causes uncomfortable feelings or sensations in the legs, especially while sitting or lying down. The sensations usually cause an overwhelming urge to move the legs. The arms can also sometimes be affected. The condition can range from mild to severe. The symptoms often interfere with a person's ability to sleep. What are the causes? The cause of this condition is not known. What increases the risk? The following factors may make you more likely to develop this condition: Being older than 50. Pregnancy. Being a woman. In general, the condition is more common in women than in men. A family history of the condition. Having iron deficiency. Overuse of caffeine, nicotine, or alcohol. Certain medical conditions, such as kidney disease, Parkinson's disease, or nerve damage. Certain medicines, such as those for high blood pressure, nausea, colds, allergies, depression, and some heart conditions. What are the signs or symptoms? The main symptom of this condition is uncomfortable sensations in the legs, such as: Pulling. Tingling. Prickling. Throbbing. Crawling. Burning. Usually, the sensations: Affect both sides of the body. Are worse when you sit or lie down. Are worse at night. These may make it difficult to fall asleep. Make you have a strong urge to move your legs. Are temporarily relieved by moving your legs or standing. The arms can also be affected, but this is rare. People who have this condition often have tiredness during the day because of their lack of sleep at night. How is this diagnosed? This condition may be diagnosed based on: Your symptoms. Blood tests. In some cases, you may be monitored in a sleep lab by a specialist (a sleep study). This can detect any disruptions in your sleep. How is this treated? This condition is treated by managing the symptoms. This may include: Lifestyle changes, such as  exercising, using relaxation techniques, and avoiding caffeine, alcohol, or tobacco. Iron supplements. Medicines. Parkinson's medications may be tried first. Anti-seizure medications can also be helpful. Follow these instructions at home: General instructions Take over-the-counter and prescription medicines only as told by your health care provider. Use methods to help relieve the uncomfortable sensations, such as: Massaging your legs. Walking or stretching. Taking a cold or hot bath. Keep all follow-up visits. This is important. Lifestyle     Practice good sleep habits. For example, go to bed and get up at the same time every day. Most adults should get 7-9 hours of sleep each night. Exercise regularly. Try to get at least 30 minutes of exercise most days of the week. Practice ways of relaxing, such as yoga or meditation. Avoid caffeine and alcohol. Do not use any products that contain nicotine or tobacco. These products include cigarettes, chewing tobacco, and vaping devices, such as e-cigarettes. If you need help quitting, ask your health care provider. Where to find more information National Institute of Neurological Disorders and Stroke: www.ninds.nih.gov Contact a health care provider if: Your symptoms get worse or they do not improve with treatment. Summary Restless legs syndrome is a condition that causes uncomfortable feelings or sensations in the legs, especially while sitting or lying down. The symptoms often interfere with your ability to sleep. This condition is treated by managing the symptoms. You may need to make lifestyle changes or take medicines. This information is not intended to replace advice given to you by your health care provider. Make sure you discuss any questions you have with your health care provider. Document Revised: 01/13/2021 Document Reviewed: 01/13/2021 Elsevier Patient Education    2023 [employer] Inc.  

## 2022-08-20 LAB — LDL CHOLESTEROL, DIRECT

## 2022-08-20 LAB — MICROALBUMIN / CREATININE URINE RATIO
Creatinine, Urine: 162.5 mg/dL
Microalb/Creat Ratio: 8 mg/g creat (ref 0–29)
Microalbumin, Urine: 13 ug/mL

## 2022-08-21 LAB — SPECIMEN STATUS REPORT

## 2022-08-21 LAB — LDL CHOLESTEROL, DIRECT: LDL Direct: 81 mg/dL (ref 0–99)

## 2022-09-11 ENCOUNTER — Telehealth: Payer: BC Managed Care – PPO | Admitting: Nurse Practitioner

## 2022-09-11 ENCOUNTER — Encounter: Payer: Self-pay | Admitting: Nurse Practitioner

## 2022-09-11 DIAGNOSIS — H9202 Otalgia, left ear: Secondary | ICD-10-CM | POA: Diagnosis not present

## 2022-09-11 MED ORDER — AMOXICILLIN-POT CLAVULANATE 875-125 MG PO TABS: 1.0000 | ORAL_TABLET | Freq: Two times a day (BID) | ORAL | 0 refills | Status: DC

## 2022-09-11 NOTE — Patient Instructions (Signed)
Benjamin Hale, thank you for joining Chevis Pretty, [name] for today's virtual visit.  While this provider is not your primary care provider (PCP), if your PCP is located in our provider database this encounter information will be shared with them immediately following your visit.   [facility] account gives you access to today's visit and all your visits, tests, and labs performed at [facility] " click here if you don't have a [facility] account or go to [email or web address]  Consent: (Patient) Benjamin Hale provided verbal consent for this virtual visit at the beginning of the encounter.  Current Medications:  Current Outpatient Medications:    amoxicillin-clavulanate (AUGMENTIN) 875-125 MG tablet, Take 1 tablet by mouth 2 (two) times daily., Disp: 14 tablet, Rfl: 0   amLODipine (NORVASC) 10 MG tablet, Take 1 tablet (10 mg total) by mouth daily., Disp: 90 tablet, Rfl: 1   aspirin EC 81 MG tablet, Take 81 mg by mouth daily., Disp: , Rfl:    Continuous Blood Gluc Sensor (FREESTYLE LIBRE 3 SENSOR) MISC, Place 1 sensor on the skin every 14 days (apply to back of arm). Use to check glucose continuously, Disp: 6 each, Rfl: 1   diazepam (VALIUM) 5 MG tablet, TAKE 1 TABLET EVERY 12 HOURS AS NEEDED FOR ANXIETY, Disp: 90 tablet, Rfl: 1   escitalopram (LEXAPRO) 10 MG tablet, TAKE ONE (1) TABLET EACH DAY, Disp: 90 tablet, Rfl: 1   fenofibrate (TRICOR) 145 MG tablet, Take 1 tablet (145 mg total) by mouth daily., Disp: 90 tablet, Rfl: 1   fluticasone (FLONASE) 50 MCG/ACT nasal spray, Place 2 sprays into both nostrils daily as needed for rhinitis or allergies., Disp: 48 g, Rfl: 1   glimepiride (AMARYL) 4 MG tablet, Take 1 tablet (4 mg total) by mouth daily before breakfast., Disp: 90 tablet, Rfl: 1   guaiFENesin (MUCINEX) 600 MG 12 hr tablet, Take 1 tablet (600 mg total) by mouth 2 (two) times daily., Disp: 30 tablet, Rfl: 0   ibuprofen (ADVIL,MOTRIN) 200 MG  tablet, Take 600 mg by mouth every 6 (six) hours as needed (For headache or pain.)., Disp: , Rfl:    insulin glargine-yfgn (SEMGLEE, YFGN,) 100 UNIT/ML injection, Inject 0.26 mLs (26 Units total) into the skin daily., Disp: 30 mL, Rfl: 1   Insulin Pen Needle (BD PEN NEEDLE MICRO U/F) 32G X 6 MM MISC, Use to give Semglee as needed, Disp: 100 each, Rfl: 6   lisinopril (ZESTRIL) 40 MG tablet, Take 1 tablet (40 mg total) by mouth daily., Disp: 90 tablet, Rfl: 1   metFORMIN (GLUCOPHAGE) 1000 MG tablet, Take 1 tablet (1,000 mg total) by mouth 2 (two) times daily with a meal., Disp: 180 tablet, Rfl: 1   metoprolol tartrate (LOPRESSOR) 25 MG tablet, Take 1 tablet (25 mg total) by mouth 2 (two) times daily., Disp: 180 tablet, Rfl: 1   Multiple Vitamin (MULTIVITAMIN WITH MINERALS) TABS tablet, Take 1 tablet by mouth daily., Disp: , Rfl:    nitroGLYCERIN (NITROSTAT) 0.4 MG SL tablet, Place 1 tablet (0.4 mg total) under the tongue every 5 (five) minutes as needed for chest pain., Disp: 25 tablet, Rfl: 3   omega-3 acid ethyl esters (LOVAZA) 1 g capsule, Take 1 capsule (1 g total) by mouth 2 (two) times daily., Disp: 180 capsule, Rfl: 3   omeprazole (PRILOSEC) 20 MG capsule, TAKE 1 CAPSULE DAILY AS NEEDED FOR ACID REFLUX, Disp: 90 capsule, Rfl: 1   Probiotic Product (PROBIOTIC PO), Take 1 capsule  by mouth daily., Disp: , Rfl:    Medications ordered in this encounter:  Meds ordered this encounter  Medications   amoxicillin-clavulanate (AUGMENTIN) 875-125 MG tablet    Sig: Take 1 tablet by mouth 2 (two) times daily.    Dispense:  14 tablet    Refill:  0    Order Specific Question:   Supervising Provider    Answer:   Caryl Pina A [id number]     *If you need refills on other medications prior to your next appointment, please contact your pharmacy*  Follow-Up: Call back or seek an in-person evaluation if the symptoms worsen or if the condition fails to improve as anticipated.  [place]  [phone]  Other Instructions Continue flonase as prescribed   If you have been instructed to have an in-person evaluation today at a local Urgent Care facility, please use the link below. It will take you to a list of all of our available [facility], including address, phone number and hours of operation. Please do not delay care.  [facility]  If you or a family member do not have a primary care provider, use the link below to schedule a visit and establish care. When you choose a [facility] primary care physician or advanced practice provider, you gain a long-term partner in health. Find a Primary Care Provider  Learn more about [facility] in-office and virtual care options: [facility]

## 2022-09-11 NOTE — Progress Notes (Signed)
Virtual Visit Consent   Benjamin Hale, you are scheduled for a virtual visit with Benjamin Hale Done, [place], a [place] provider, today.     Just as with appointments in the office, your consent must be obtained to participate.  Your consent will be active for this visit and any virtual visit you may have with one of our providers in the next 365 days.     If you have a MyChart account, a copy of this consent can be sent to you electronically.  All virtual visits are billed to your insurance company just like a traditional visit in the office.    As this is a virtual visit, video technology does not allow for your provider to perform a traditional examination.  This may limit your provider's ability to fully assess your condition.  If your provider identifies any concerns that need to be evaluated in person or the need to arrange testing (such as labs, EKG, etc.), we will make arrangements to do so.     Although advances in technology are sophisticated, we cannot ensure that it will always work on either your end or our end.  If the connection with a video visit is poor, the visit may have to be switched to a telephone visit.  With either a video or telephone visit, we are not always able to ensure that we have a secure connection.     I need to obtain your verbal consent now.   Are you willing to proceed with your visit today? YES   [name] has provided verbal consent on 09/11/2022 for a virtual visit (video or telephone).   Benjamin Hale Done, [name]   Date: 09/11/2022 2:04 PM   Virtual Visit via Video Note   I, Benjamin Hale Done, connected with Benjamin Hale ([id number], 1966/04/21) on 09/11/22 at  2:45 PM EDT by a video-enabled telemedicine application and verified that I am speaking with the correct person using two identifiers.  Location: Patient: Virtual Visit Location Patient: Home Provider: Virtual Visit Location Provider: Mobile   I discussed the  limitations of evaluation and management by telemedicine and the availability of in person appointments. The patient expressed understanding and agreed to proceed.    History of Present Illness: Benjamin Hale is a 57 y.o. who identifies as a male who was assigned male at birth, and is being seen today for otalgia.  HPI: Patient was seen several weeks ago with ear effusion. Left has has gradually continue to have  oain increase.  Otalgia  There is pain in the left ear. This is a new problem. The current episode started in the past 7 days. The problem occurs constantly. The problem has been waxing and waning. There has been no fever. The pain is at a severity of 8/10. The pain is moderate. Pertinent negatives include no coughing, headaches, rhinorrhea or sore throat. He has tried nothing for the symptoms. The treatment provided mild relief.    Review of Systems  HENT:  Positive for ear pain. Negative for rhinorrhea and sore throat.   Respiratory:  Negative for cough.   Neurological:  Negative for headaches.    Problems:  Patient Active Problem List   Diagnosis Date Noted   Chronic left shoulder pain 01/07/2022   Pseudoarthrosis of cervical spine ([place]) 07/19/2021   Restless leg syndrome 01/01/2021   Cervical neuropathic pain 01/01/2021   Depression, major, single episode, moderate ([place]) 06/23/2019   Type 2 diabetes mellitus, controlled ([place]) 05/01/2014  Essential hypertension 05/01/2014   Hyperlipidemia with target LDL less than 100 03/27/2014   Asthma 03/27/2014   GERD (gastroesophageal reflux disease) 03/27/2014    Allergies: No Known Allergies Medications:  Current Outpatient Medications:    amLODipine (NORVASC) 10 MG tablet, Take 1 tablet (10 mg total) by mouth daily., Disp: 90 tablet, Rfl: 1   aspirin EC 81 MG tablet, Take 81 mg by mouth daily., Disp: , Rfl:    Continuous Blood Gluc Sensor (FREESTYLE LIBRE 3 SENSOR) MISC, Place 1 sensor on the skin every 14 days (apply to  back of arm). Use to check glucose continuously, Disp: 6 each, Rfl: 1   diazepam (VALIUM) 5 MG tablet, TAKE 1 TABLET EVERY 12 HOURS AS NEEDED FOR ANXIETY, Disp: 90 tablet, Rfl: 1   escitalopram (LEXAPRO) 10 MG tablet, TAKE ONE (1) TABLET EACH DAY, Disp: 90 tablet, Rfl: 1   fenofibrate (TRICOR) 145 MG tablet, Take 1 tablet (145 mg total) by mouth daily., Disp: 90 tablet, Rfl: 1   fluticasone (FLONASE) 50 MCG/ACT nasal spray, Place 2 sprays into both nostrils daily as needed for rhinitis or allergies., Disp: 48 g, Rfl: 1   glimepiride (AMARYL) 4 MG tablet, Take 1 tablet (4 mg total) by mouth daily before breakfast., Disp: 90 tablet, Rfl: 1   guaiFENesin (MUCINEX) 600 MG 12 hr tablet, Take 1 tablet (600 mg total) by mouth 2 (two) times daily., Disp: 30 tablet, Rfl: 0   ibuprofen (ADVIL,MOTRIN) 200 MG tablet, Take 600 mg by mouth every 6 (six) hours as needed (For headache or pain.)., Disp: , Rfl:    insulin glargine-yfgn (SEMGLEE, YFGN,) 100 UNIT/ML injection, Inject 0.26 mLs (26 Units total) into the skin daily., Disp: 30 mL, Rfl: 1   Insulin Pen Needle (BD PEN NEEDLE MICRO U/F) 32G X 6 MM MISC, Use to give Semglee as needed, Disp: 100 each, Rfl: 6   lisinopril (ZESTRIL) 40 MG tablet, Take 1 tablet (40 mg total) by mouth daily., Disp: 90 tablet, Rfl: 1   metFORMIN (GLUCOPHAGE) 1000 MG tablet, Take 1 tablet (1,000 mg total) by mouth 2 (two) times daily with a meal., Disp: 180 tablet, Rfl: 1   metoprolol tartrate (LOPRESSOR) 25 MG tablet, Take 1 tablet (25 mg total) by mouth 2 (two) times daily., Disp: 180 tablet, Rfl: 1   Multiple Vitamin (MULTIVITAMIN WITH MINERALS) TABS tablet, Take 1 tablet by mouth daily., Disp: , Rfl:    nitroGLYCERIN (NITROSTAT) 0.4 MG SL tablet, Place 1 tablet (0.4 mg total) under the tongue every 5 (five) minutes as needed for chest pain., Disp: 25 tablet, Rfl: 3   omega-3 acid ethyl esters (LOVAZA) 1 g capsule, Take 1 capsule (1 g total) by mouth 2 (two) times daily., Disp: 180  capsule, Rfl: 3   omeprazole (PRILOSEC) 20 MG capsule, TAKE 1 CAPSULE DAILY AS NEEDED FOR ACID REFLUX, Disp: 90 capsule, Rfl: 1   Probiotic Product (PROBIOTIC PO), Take 1 capsule by mouth daily., Disp: , Rfl:   Observations/Objective: Patient is well-developed, well-nourished in no acute distress.  Resting comfortably  at home.  Head is normocephalic, atraumatic.  No labored breathing.  Speech is clear and coherent with logical content.  Patient is alert and oriented at baseline.  No pain with moving tragus  Assessment and Plan:  Loreli Dollar in today with chief complaint of Otalgia   1. Left ear pain Motirn or tylenol OTC Force fluids Continue flonase nasal spray  Meds ordered this encounter  Medications   amoxicillin-clavulanate (  AUGMENTIN) 875-125 MG tablet    Sig: Take 1 tablet by mouth 2 (two) times daily.    Dispense:  14 tablet    Refill:  0    Order Specific Question:   Supervising Provider    Answer:   Caryl Pina A [id number]       Follow Up Instructions: I discussed the assessment and treatment plan with the patient. The patient was provided an opportunity to ask questions and all were answered. The patient agreed with the plan and demonstrated an understanding of the instructions.  A copy of instructions were sent to the patient via MyChart.  The patient was advised to call back or seek an in-person evaluation if the symptoms worsen or if the condition fails to improve as anticipated.  Time:  I spent 6 minutes with the patient via telehealth technology discussing the above problems/concerns.    Benjamin Hale Done, [name]

## 2022-10-16 ENCOUNTER — Other Ambulatory Visit: Payer: Self-pay | Admitting: Nurse Practitioner

## 2022-10-16 DIAGNOSIS — K219 Gastro-esophageal reflux disease without esophagitis: Secondary | ICD-10-CM

## 2022-11-16 ENCOUNTER — Other Ambulatory Visit: Payer: Self-pay | Admitting: Nurse Practitioner

## 2022-11-16 DIAGNOSIS — M5412 Radiculopathy, cervical region: Secondary | ICD-10-CM

## 2022-11-17 ENCOUNTER — Encounter: Payer: Self-pay | Admitting: Family Medicine

## 2022-11-17 ENCOUNTER — Telehealth: Payer: BC Managed Care – PPO | Admitting: Family Medicine

## 2022-11-17 DIAGNOSIS — J40 Bronchitis, not specified as acute or chronic: Secondary | ICD-10-CM

## 2022-11-17 MED ORDER — AMOXICILLIN-POT CLAVULANATE 875-125 MG PO TABS: 1.0000 | ORAL_TABLET | Freq: Two times a day (BID) | ORAL | 0 refills | Status: DC

## 2022-11-17 NOTE — Progress Notes (Signed)
Virtual Visit via MyChart video note  I connected with Benjamin Hale on 11/17/22 at 1408 by video and verified that I am speaking with the correct person using two identifiers. Benjamin Hale is currently located at home and patient are currently with her during visit. The provider, Elige Radon Jenisse Vullo, MD is located in their office at time of visit.  Call ended at 1414  I discussed the limitations, risks, security and privacy concerns of performing an evaluation and management service by video and the availability of in person appointments. I also discussed with the patient that there may be a patient responsible charge related to this service. The patient expressed understanding and agreed to proceed.   History and Present Illness: Patient is calling in for congestion, fever and headaches.  It has been worsening over the past 2 weeks.  He has been worsening and sore throat.  He has been using flonase and inhaler.  He feels like it is getting into his chest and wheezing.  His pulse ox is 95-96.  His temp was 100.4 at the highest.  He just feels like he is not improving and feels like it started to get down in his chest and worsening.  1. Bronchitis     Outpatient Encounter Medications as of 11/17/2022  Medication Sig   amLODipine (NORVASC) 10 MG tablet Take 1 tablet (10 mg total) by mouth daily.   amoxicillin-clavulanate (AUGMENTIN) 875-125 MG tablet Take 1 tablet by mouth 2 (two) times daily.   aspirin EC 81 MG tablet Take 81 mg by mouth daily.   Continuous Blood Gluc Sensor (FREESTYLE LIBRE 3 SENSOR) MISC Place 1 sensor on the skin every 14 days (apply to back of arm). Use to check glucose continuously   diazepam (VALIUM) 5 MG tablet TAKE 1 TABLET EVERY 12 HOURS AS NEEDED FOR ANXIETY   escitalopram (LEXAPRO) 10 MG tablet TAKE ONE (1) TABLET EACH DAY   fenofibrate (TRICOR) 145 MG tablet Take 1 tablet (145 mg total) by mouth daily.   fluticasone (FLONASE) 50 MCG/ACT nasal spray Place  2 sprays into both nostrils daily as needed for rhinitis or allergies.   glimepiride (AMARYL) 4 MG tablet Take 1 tablet (4 mg total) by mouth daily before breakfast.   guaiFENesin (MUCINEX) 600 MG 12 hr tablet Take 1 tablet (600 mg total) by mouth 2 (two) times daily.   ibuprofen (ADVIL,MOTRIN) 200 MG tablet Take 600 mg by mouth every 6 (six) hours as needed (For headache or pain.).   insulin glargine-yfgn (SEMGLEE, YFGN,) 100 UNIT/ML injection Inject 0.26 mLs (26 Units total) into the skin daily.   Insulin Pen Needle (BD PEN NEEDLE MICRO U/F) 32G X 6 MM MISC Use to give Semglee as needed   lisinopril (ZESTRIL) 40 MG tablet Take 1 tablet (40 mg total) by mouth daily.   metFORMIN (GLUCOPHAGE) 1000 MG tablet Take 1 tablet (1,000 mg total) by mouth 2 (two) times daily with a meal.   metoprolol tartrate (LOPRESSOR) 25 MG tablet Take 1 tablet (25 mg total) by mouth 2 (two) times daily.   Multiple Vitamin (MULTIVITAMIN WITH MINERALS) TABS tablet Take 1 tablet by mouth daily.   nitroGLYCERIN (NITROSTAT) 0.4 MG SL tablet Place 1 tablet (0.4 mg total) under the tongue every 5 (five) minutes as needed for chest pain.   omega-3 acid ethyl esters (LOVAZA) 1 g capsule Take 1 capsule (1 g total) by mouth 2 (two) times daily.   omeprazole (PRILOSEC) 20 MG capsule TAKE 1  CAPSULE DAILY AS NEEDED FOR ACID REFLUX   Probiotic Product (PROBIOTIC PO) Take 1 capsule by mouth daily.   [DISCONTINUED] amoxicillin-clavulanate (AUGMENTIN) 875-125 MG tablet Take 1 tablet by mouth 2 (two) times daily.   No facility-administered encounter medications on file as of 11/17/2022.    Review of Systems  Constitutional:  Positive for fever. Negative for chills.  HENT:  Positive for congestion, postnasal drip and rhinorrhea. Negative for ear discharge, ear pain, sneezing, sore throat and voice change.   Eyes:  Negative for pain, discharge, redness and visual disturbance.  Respiratory:  Positive for cough and wheezing. Negative for  shortness of breath.   Cardiovascular:  Negative for chest pain and leg swelling.  Musculoskeletal:  Negative for gait problem.  Skin:  Negative for rash.  All other systems reviewed and are negative.   Observations/Objective: Patient sounds comfortable and in no acute distress  Assessment and Plan: Problem List Items Addressed This Visit   None Visit Diagnoses     Bronchitis    -  Primary   Relevant Medications   amoxicillin-clavulanate (AUGMENTIN) 875-125 MG tablet       Will treat like a bronchitis, it sounds like it is worsening and not improving with conservative management Follow up plan: Return if symptoms worsen or fail to improve.     I discussed the assessment and treatment plan with the patient. The patient was provided an opportunity to ask questions and all were answered. The patient agreed with the plan and demonstrated an understanding of the instructions.   The patient was advised to call back or seek an in-person evaluation if the symptoms worsen or if the condition fails to improve as anticipated.  The above assessment and management plan was discussed with the patient. The patient verbalized understanding of and has agreed to the management plan. Patient is aware to call the clinic if symptoms persist or worsen. Patient is aware when to return to the clinic for a follow-up visit. Patient educated on when it is appropriate to go to the emergency department.    I provided 6 minutes of non-face-to-face time during this encounter.    Nils Pyle, MD

## 2022-12-05 ENCOUNTER — Ambulatory Visit: Payer: BC Managed Care – PPO | Admitting: Nurse Practitioner

## 2022-12-05 ENCOUNTER — Encounter: Payer: Self-pay | Admitting: Nurse Practitioner

## 2022-12-05 VITALS — BP 128/79 | HR 57 | Temp 98.3°F | Resp 20 | Ht 70.0 in | Wt 221.0 lb

## 2022-12-05 DIAGNOSIS — E119 Type 2 diabetes mellitus without complications: Secondary | ICD-10-CM | POA: Insufficient documentation

## 2022-12-05 DIAGNOSIS — Z7984 Long term (current) use of oral hypoglycemic drugs: Secondary | ICD-10-CM

## 2022-12-05 DIAGNOSIS — E1169 Type 2 diabetes mellitus with other specified complication: Secondary | ICD-10-CM

## 2022-12-05 DIAGNOSIS — Z125 Encounter for screening for malignant neoplasm of prostate: Secondary | ICD-10-CM

## 2022-12-05 DIAGNOSIS — I10 Essential (primary) hypertension: Secondary | ICD-10-CM

## 2022-12-05 DIAGNOSIS — E785 Hyperlipidemia, unspecified: Secondary | ICD-10-CM | POA: Diagnosis not present

## 2022-12-05 DIAGNOSIS — Z7985 Long-term (current) use of injectable non-insulin antidiabetic drugs: Secondary | ICD-10-CM

## 2022-12-05 DIAGNOSIS — K219 Gastro-esophageal reflux disease without esophagitis: Secondary | ICD-10-CM

## 2022-12-05 DIAGNOSIS — G2581 Restless legs syndrome: Secondary | ICD-10-CM

## 2022-12-05 DIAGNOSIS — F411 Generalized anxiety disorder: Secondary | ICD-10-CM | POA: Diagnosis not present

## 2022-12-05 DIAGNOSIS — M5412 Radiculopathy, cervical region: Secondary | ICD-10-CM

## 2022-12-05 DIAGNOSIS — J029 Acute pharyngitis, unspecified: Secondary | ICD-10-CM

## 2022-12-05 DIAGNOSIS — F321 Major depressive disorder, single episode, moderate: Secondary | ICD-10-CM

## 2022-12-05 LAB — PSA, TOTAL AND FREE

## 2022-12-05 LAB — BAYER DCA HB A1C WAIVED: HB A1C (BAYER DCA - WAIVED): 7.3 % — ABNORMAL HIGH (ref 4.8–5.6)

## 2022-12-05 MED ORDER — METFORMIN HCL 1000 MG PO TABS
1000.0000 mg | ORAL_TABLET | Freq: Two times a day (BID) | ORAL | 1 refills | Status: DC
Start: 2022-12-05 — End: 2022-12-24

## 2022-12-05 MED ORDER — FENOFIBRATE 145 MG PO TABS
145.0000 mg | ORAL_TABLET | Freq: Every day | ORAL | 1 refills | Status: DC
Start: 2022-12-05 — End: 2023-03-10

## 2022-12-05 MED ORDER — METOPROLOL TARTRATE 25 MG PO TABS
25.0000 mg | ORAL_TABLET | Freq: Two times a day (BID) | ORAL | 1 refills | Status: DC
Start: 2022-12-05 — End: 2023-03-10

## 2022-12-05 MED ORDER — DIAZEPAM 5 MG PO TABS
ORAL_TABLET | ORAL | 1 refills | Status: DC
Start: 2022-12-05 — End: 2023-03-10

## 2022-12-05 MED ORDER — AMLODIPINE BESYLATE 10 MG PO TABS: 10.0000 mg | ORAL_TABLET | Freq: Every day | ORAL | 1 refills | Status: DC

## 2022-12-05 MED ORDER — OMEPRAZOLE 20 MG PO CPDR
DELAYED_RELEASE_CAPSULE | ORAL | 1 refills | Status: DC
Start: 2022-12-05 — End: 2022-12-24

## 2022-12-05 MED ORDER — INSULIN GLARGINE-YFGN 100 UNIT/ML ~~LOC~~ SOLN
26.0000 [IU] | Freq: Every day | SUBCUTANEOUS | 1 refills | Status: DC
Start: 2022-12-05 — End: 2023-03-10

## 2022-12-05 MED ORDER — ESCITALOPRAM OXALATE 10 MG PO TABS
ORAL_TABLET | ORAL | 1 refills | Status: DC
Start: 2022-12-05 — End: 2022-12-24

## 2022-12-05 MED ORDER — GLIMEPIRIDE 4 MG PO TABS
4.0000 mg | ORAL_TABLET | Freq: Every day | ORAL | 1 refills | Status: DC
Start: 2022-12-05 — End: 2023-03-10

## 2022-12-05 MED ORDER — OMEGA-3-ACID ETHYL ESTERS 1 G PO CAPS
1.0000 | ORAL_CAPSULE | Freq: Two times a day (BID) | ORAL | 3 refills | Status: DC
Start: 2022-12-05 — End: 2023-08-27

## 2022-12-05 MED ORDER — LISINOPRIL 40 MG PO TABS: 40.0000 mg | ORAL_TABLET | Freq: Every day | ORAL | 1 refills | Status: DC

## 2022-12-05 NOTE — Progress Notes (Signed)
Subjective:    Patient ID: Benjamin Hale, male    DOB: Mar 10, 1966, 57 y.o.   MRN: [id number]   Chief Complaint: medical management of chronic issues     HPI:  Benjamin Hale is a 57 y.o. who identifies as a male who was assigned male at birth.   Social history: Lives with: wife and son Work history: [employer]   Comes in today for follow up of the following chronic medical issues:  1. Essential hypertension No c/o chest pain, sob or headache. Does not check blood pressure at home. BP Readings from Last 3 Encounters:  08/19/22 134/77  05/20/22 125/71  04/21/22 118/65     2. Hyperlipidemia associated with type 2 diabetes mellitus (HCC) Does not watch diet and does no dedicated exercise. Lab Results  Component Value Date   CHOL 134 08/19/2022   HDL 21 (L) 08/19/2022   LDLCALC 77 08/19/2022   LDLDIRECT 81 08/19/2022   TRIG 214 (H) 08/19/2022   CHOLHDL 6.4 (H) 08/19/2022     3. Diabetes mellitus treated with oral medication (HCC) 4. Long-term current use of injectable noninsulin antidiabetic medication Fasting blood sugars are running around 160-200. No low blood sugars. Lab Results  Component Value Date   HGBA1C 7.4 (H) 08/19/2022     5. Gastroesophageal reflux disease without esophagitis Is on omeprazole daily and works well for him.  6. Depression, major, single episode, moderate (HCC) Has been on lexapro for several years now. Is doing well.    12/05/2022    8:20 AM 08/19/2022    8:06 AM 05/20/2022    8:09 AM  Depression screen PHQ 2/9  Decreased Interest 0 0 0  Down, Depressed, Hopeless 0 0 0  PHQ - 2 Score 0 0 0  Altered sleeping 1 0 0  Tired, decreased energy 0 0 0  Change in appetite 0 0 0  Feeling bad or failure about yourself  0 0 0  Trouble concentrating 0 0 0  Moving slowly or fidgety/restless 0 0 0  Suicidal thoughts 0 0 0  PHQ-9 Score 1 0 0  Difficult doing work/chores Not difficult at all Not difficult at all Not difficult at all      7. GAD Takes valium daily. The valium also helps with his chronic neck pain    12/05/2022    8:20 AM 08/19/2022    8:06 AM 05/20/2022    8:10 AM 04/21/2022   11:01 AM  GAD 7 : Generalized Anxiety Score  Nervous, Anxious, on Edge 0 0 0 0  Control/stop worrying 0 0 0 0  Worry too much - different things 0 0 0 0  Trouble relaxing 0 0 0 0  Restless 0 0 0 0  Easily annoyed or irritable 0 0 0 0  Afraid - awful might happen 0 0 0 0  Total GAD 7 Score 0 0 0 0  Anxiety Difficulty Not difficult at all Not difficult at all Not difficult at all Not difficult at all      8. Restless leg syndrome Valium helps some   New complaints: Has had a sore throat for 3 weeks. Antibiotic did not help. He is concerned because of his smoking history.  No Known Allergies Outpatient Encounter Medications as of 12/05/2022  Medication Sig   amLODipine (NORVASC) 10 MG tablet Take 1 tablet (10 mg total) by mouth daily.   amoxicillin-clavulanate (AUGMENTIN) 875-125 MG tablet Take 1 tablet by mouth 2 (two) times daily.   aspirin  EC 81 MG tablet Take 81 mg by mouth daily.   Continuous Blood Gluc Sensor (FREESTYLE LIBRE 3 SENSOR) MISC Place 1 sensor on the skin every 14 days (apply to back of arm). Use to check glucose continuously   diazepam (VALIUM) 5 MG tablet TAKE 1 TABLET EVERY 12 HOURS AS NEEDED FOR ANXIETY   escitalopram (LEXAPRO) 10 MG tablet TAKE ONE (1) TABLET EACH DAY   fenofibrate (TRICOR) 145 MG tablet Take 1 tablet (145 mg total) by mouth daily.   fluticasone (FLONASE) 50 MCG/ACT nasal spray Place 2 sprays into both nostrils daily as needed for rhinitis or allergies.   glimepiride (AMARYL) 4 MG tablet Take 1 tablet (4 mg total) by mouth daily before breakfast.   guaiFENesin (MUCINEX) 600 MG 12 hr tablet Take 1 tablet (600 mg total) by mouth 2 (two) times daily.   ibuprofen (ADVIL,MOTRIN) 200 MG tablet Take 600 mg by mouth every 6 (six) hours as needed (For headache or pain.).   insulin  glargine-yfgn (SEMGLEE, YFGN,) 100 UNIT/ML injection Inject 0.26 mLs (26 Units total) into the skin daily.   Insulin Pen Needle (BD PEN NEEDLE MICRO U/F) 32G X 6 MM MISC Use to give Semglee as needed   lisinopril (ZESTRIL) 40 MG tablet Take 1 tablet (40 mg total) by mouth daily.   metFORMIN (GLUCOPHAGE) 1000 MG tablet Take 1 tablet (1,000 mg total) by mouth 2 (two) times daily with a meal.   metoprolol tartrate (LOPRESSOR) 25 MG tablet Take 1 tablet (25 mg total) by mouth 2 (two) times daily.   Multiple Vitamin (MULTIVITAMIN WITH MINERALS) TABS tablet Take 1 tablet by mouth daily.   nitroGLYCERIN (NITROSTAT) 0.4 MG SL tablet Place 1 tablet (0.4 mg total) under the tongue every 5 (five) minutes as needed for chest pain.   omega-3 acid ethyl esters (LOVAZA) 1 g capsule Take 1 capsule (1 g total) by mouth 2 (two) times daily.   omeprazole (PRILOSEC) 20 MG capsule TAKE 1 CAPSULE DAILY AS NEEDED FOR ACID REFLUX   Probiotic Product (PROBIOTIC PO) Take 1 capsule by mouth daily.   No facility-administered encounter medications on file as of 12/05/2022.    Past Surgical History:  Procedure Laterality Date   ANTERIOR CERVICAL DECOMP/DISCECTOMY FUSION N/A 09/22/2012   Procedure: ANTERIOR CERVICAL DECOMPRESSION/DISCECTOMY FUSION 2 LEVELS;  Surgeon: Karn Cassis, MD;  Location: [facility];  Service: Neurosurgery;  Laterality: N/A;  Cervical five-six Cervical six-seven Anterior cervical decompression/diskectomy/fusion   ATRIAL FLUTTER ABLATION  12/23/2012   CTI ablation by Dr Johney Frame   ATRIAL FLUTTER ABLATION N/A 12/23/2012   Procedure: ATRIAL FLUTTER ABLATION;  Surgeon: Hillis Range, MD;  Location[facility] CATH LAB;  Service: Cardiovascular;  Laterality: N/A;   CARDIAC CATHETERIZATION N/A 11/16/2015   Procedure: Left Heart Cath and Coronary Angiography;  Surgeon: Marykay Lex, MD;  Location: [facility] INVASIVE CV LAB;  Service: Cardiovascular;  Laterality: N/A;   CARDIOVERSION N/A 09/23/2012   Procedure:  CARDIOVERSION;  Surgeon: Gaylord Shih, MD;  Location: [facility] OR;  Service: Cardiovascular;  Laterality: N/A;   POSTERIOR CERVICAL FUSION/FORAMINOTOMY N/A 07/19/2021   Procedure: Cervical five- six Cervical six-seven Lateral Mass Fusion;  Surgeon: Lisbeth Renshaw, MD;  Location: [facility] OR;  Service: Neurosurgery;  Laterality: N/A;   TONSILLECTOMY  1973   TREATMENT FISTULA ANAL  ~ 2007   WISDOM TOOTH EXTRACTION      Family History  Problem Relation Age of Onset   Lung cancer Mother        died @  65   Brain cancer Father        died @ 43   Diabetes Maternal Uncle    Diabetes Maternal Grandfather       Controlled substance contract: n/a     Review of Systems  Constitutional:  Negative for diaphoresis.  HENT:  Positive for sore throat (left side).   Eyes:  Negative for pain.  Respiratory:  Negative for shortness of breath.   Cardiovascular:  Negative for chest pain, palpitations and leg swelling.  Gastrointestinal:  Negative for abdominal pain.  Endocrine: Negative for polydipsia.  Skin:  Negative for rash.  Neurological:  Negative for dizziness, weakness and headaches.  Hematological:  Does not bruise/bleed easily.  All other systems reviewed and are negative.      Objective:   Physical Exam Vitals and nursing note reviewed.  Constitutional:      Appearance: Normal appearance. He is well-developed.  HENT:     Head: Normocephalic.     Nose: Nose normal.     Mouth/Throat:     Mouth: Mucous membranes are moist.     Pharynx: Oropharynx is clear.  Eyes:     Pupils: Pupils are equal, round, and reactive to light.  Neck:     Thyroid: No thyroid mass or thyromegaly.     Vascular: No carotid bruit or JVD.     Trachea: Phonation normal.  Cardiovascular:     Rate and Rhythm: Normal rate and regular rhythm.  Pulmonary:     Effort: Pulmonary effort is normal. No respiratory distress.     Breath sounds: Normal breath sounds.  Abdominal:     General: Bowel sounds are normal.      Palpations: Abdomen is soft.     Tenderness: There is no abdominal tenderness.  Musculoskeletal:        General: Normal range of motion.     Cervical back: Normal range of motion and neck supple.  Lymphadenopathy:     Cervical: No cervical adenopathy.  Skin:    General: Skin is warm and dry.  Neurological:     Mental Status: He is alert and oriented to person, place, and time.  Psychiatric:        Behavior: Behavior normal.        Thought Content: Thought content normal.        Judgment: Judgment normal.    Hgba1c 7.3%       Assessment & Plan:   Benjamin Hale comes in today with chief complaint of Medical Management of Chronic Issues   Diagnosis and orders addressed:  1. Essential hypertension Low sodium diet - CBC with Differential/Platelet - CMP14+EGFR - amLODipine (NORVASC) 10 MG tablet; Take 1 tablet (10 mg total) by mouth daily.  Dispense: 90 tablet; Refill: 1 - lisinopril (ZESTRIL) 40 MG tablet; Take 1 tablet (40 mg total) by mouth daily.  Dispense: 90 tablet; Refill: 1 - metoprolol tartrate (LOPRESSOR) 25 MG tablet; Take 1 tablet (25 mg total) by mouth 2 (two) times daily.  Dispense: 180 tablet; Refill: 1  2. Hyperlipidemia associated with type 2 diabetes mellitus (HCC) Low fat diet - Lipid panel - LDL Cholesterol, Direct - fenofibrate (TRICOR) 145 MG tablet; Take 1 tablet (145 mg total) by mouth daily.  Dispense: 90 tablet; Refill: 1 - omega-3 acid ethyl esters (LOVAZA) 1 g capsule; Take 1 capsule (1 g total) by mouth 2 (two) times daily.  Dispense: 180 capsule; Refill: 3  3. Diabetes mellitus treated with oral medication (HCC) Continue to  watch carbs in diet - Bayer DCA Hb A1c Waived - glimepiride (AMARYL) 4 MG tablet; Take 1 tablet (4 mg total) by mouth daily before breakfast.  Dispense: 90 tablet; Refill: 1 - insulin glargine-yfgn (SEMGLEE, YFGN,) 100 UNIT/ML injection; Inject 0.26 mLs (26 Units total) into the skin daily.  Dispense: 30 mL; Refill: 1 -  metFORMIN (GLUCOPHAGE) 1000 MG tablet; Take 1 tablet (1,000 mg total) by mouth 2 (two) times daily with a meal.  Dispense: 180 tablet; Refill: 1  4. Long-term current use of injectable noninsulin antidiabetic medication See above  5. Gastroesophageal reflux disease without esophagitis Avoid spicy foods Do not eat 2 hours prior to bedtime - omeprazole (PRILOSEC) 20 MG capsule; TAKE 1 CAPSULE DAILY AS NEEDED FOR ACID REFLUX  Dispense: 90 capsule; Refill: 1  6. Depression, major, single episode, moderate (HCC) Stress management - escitalopram (LEXAPRO) 10 MG tablet; TAKE ONE (1) TABLET EACH DAY  Dispense: 90 tablet; Refill: 1  7. GAD (generalized anxiety disorder) - ToxASSURE Select 13 (MW), Urine - diazepam (VALIUM) 5 MG tablet; TAKE 1 TABLET EVERY 12 HOURS AS NEEDED FOR ANXIETY  Dispense: 90 tablet; Refill: 1  8. Restless leg syndrome Keep legs warm at night  9. Prostate cancer screening Labs pending - PSA, total and free  10. Sore throat - Ambulatory referral to ENT  Labs pending Health Maintenance reviewed Diet and exercise encouraged  Follow up plan: 3 months   Mary-Margaret Daphine Deutscher, [name]

## 2022-12-06 LAB — CMP14+EGFR
ALT: 29 IU/L (ref 0–44)
AST: 26 IU/L (ref 0–40)
Albumin: 4.4 g/dL (ref 3.8–4.9)
Alkaline Phosphatase: 63 IU/L (ref 44–121)
BUN/Creatinine Ratio: 15 (ref 9–20)
BUN: 14 mg/dL (ref 6–24)
Bilirubin Total: 0.9 mg/dL (ref 0.0–1.2)
CO2: 20 mmol/L (ref 20–29)
Calcium: 9.5 mg/dL (ref 8.7–10.2)
Chloride: 101 mmol/L (ref 96–106)
Creatinine, Ser: 0.92 mg/dL (ref 0.76–1.27)
Globulin, Total: 2.6 g/dL (ref 1.5–4.5)
Glucose: 223 mg/dL — ABNORMAL HIGH (ref 70–99)
Potassium: 4.2 mmol/L (ref 3.5–5.2)
Sodium: 137 mmol/L (ref 134–144)
Total Protein: 7 g/dL (ref 6.0–8.5)
eGFR: 98 mL/min/{1.73_m2} (ref >59)

## 2022-12-06 LAB — LIPID PANEL
Chol/HDL Ratio: 5.8 ratio — ABNORMAL HIGH (ref 0.0–5.0)
Cholesterol, Total: 111 mg/dL (ref 100–199)
HDL: 19 mg/dL — ABNORMAL LOW (ref >39)
LDL Chol Calc (NIH): 56 mg/dL (ref 0–99)
Triglycerides: 219 mg/dL — ABNORMAL HIGH (ref 0–149)
VLDL Cholesterol Cal: 36 mg/dL (ref 5–40)

## 2022-12-06 LAB — CBC WITH DIFFERENTIAL/PLATELET
Basophils Absolute: 0.1 10*3/uL (ref 0.0–0.2)
Basos: 1 %
EOS (ABSOLUTE): 0.2 10*3/uL (ref 0.0–0.4)
Eos: 2 %
Hematocrit: 44.3 % (ref 37.5–51.0)
Hemoglobin: 15.4 g/dL (ref 13.0–17.7)
Immature Grans (Abs): 0 10*3/uL (ref 0.0–0.1)
Immature Granulocytes: 0 %
Lymphocytes Absolute: 2.1 10*3/uL (ref 0.7–3.1)
Lymphs: 29 %
MCH: 28.9 pg (ref 26.6–33.0)
MCHC: 34.8 g/dL (ref 31.5–35.7)
MCV: 83 fL (ref 79–97)
Monocytes Absolute: 0.6 10*3/uL (ref 0.1–0.9)
Monocytes: 9 %
Neutrophils Absolute: 4.1 10*3/uL (ref 1.4–7.0)
Neutrophils: 59 %
Platelets: 250 10*3/uL (ref 150–450)
RBC: 5.33 x10E6/uL (ref 4.14–5.80)
RDW: 13 % (ref 11.6–15.4)
WBC: 7 10*3/uL (ref 3.4–10.8)

## 2022-12-06 LAB — LDL CHOLESTEROL, DIRECT: LDL Direct: 62 mg/dL (ref 0–99)

## 2022-12-06 LAB — PSA, TOTAL AND FREE: Prostate Specific Ag, Serum: 0.3 ng/mL (ref 0.0–4.0)

## 2022-12-11 LAB — TOXASSURE SELECT 13 (MW), URINE

## 2022-12-23 ENCOUNTER — Other Ambulatory Visit: Payer: Self-pay | Admitting: Nurse Practitioner

## 2022-12-23 DIAGNOSIS — F321 Major depressive disorder, single episode, moderate: Secondary | ICD-10-CM

## 2022-12-23 DIAGNOSIS — E119 Type 2 diabetes mellitus without complications: Secondary | ICD-10-CM

## 2022-12-23 DIAGNOSIS — K219 Gastro-esophageal reflux disease without esophagitis: Secondary | ICD-10-CM

## 2022-12-23 DIAGNOSIS — I10 Essential (primary) hypertension: Secondary | ICD-10-CM

## 2023-01-12 ENCOUNTER — Telehealth: Payer: Self-pay

## 2023-01-12 NOTE — Telephone Encounter (Signed)
Benjamin Hale (Key: [id number]) PA Case ID #: [id number] Need Help? Call us at [phone] Outcome Approved today by [employer] 2017 CaseId:[id number];Status:Approved;Review Type:Prior Auth;Coverage Start Date:12/13/2022;Coverage End Date:01/12/2024; Authorization Expiration Date: 01/11/2024 Drug Omega-3-acid Ethyl Esters 1GM capsules ePA cloud logo Form [employer] Electronic PA Form [phone] NCPDP)

## 2023-01-13 ENCOUNTER — Other Ambulatory Visit (HOSPITAL_COMMUNITY): Payer: Self-pay

## 2023-01-13 NOTE — Telephone Encounter (Signed)
Pharmacy Patient Advocate Encounter  Received notification from [employer] that Prior Authorization for Omega-3-acid Ethyl Esters 1GM capsules has been APPROVED from 12/13/22 to 01/12/24. Ran test claim, Copay is $--- (Filled 01/10/23 via mail)  PA #/Case ID/Reference #: [id number]

## 2023-01-20 DIAGNOSIS — H938X2 Other specified disorders of left ear: Secondary | ICD-10-CM | POA: Diagnosis not present

## 2023-01-20 DIAGNOSIS — H93292 Other abnormal auditory perceptions, left ear: Secondary | ICD-10-CM | POA: Diagnosis not present

## 2023-02-04 ENCOUNTER — Other Ambulatory Visit: Payer: Self-pay | Admitting: Nurse Practitioner

## 2023-02-04 DIAGNOSIS — J069 Acute upper respiratory infection, unspecified: Secondary | ICD-10-CM

## 2023-02-04 DIAGNOSIS — J40 Bronchitis, not specified as acute or chronic: Secondary | ICD-10-CM

## 2023-02-19 ENCOUNTER — Ambulatory Visit: Payer: BC Managed Care – PPO | Admitting: Orthopaedic Surgery

## 2023-02-19 ENCOUNTER — Encounter: Payer: Self-pay | Admitting: Orthopaedic Surgery

## 2023-02-19 ENCOUNTER — Other Ambulatory Visit (INDEPENDENT_AMBULATORY_CARE_PROVIDER_SITE_OTHER): Payer: BC Managed Care – PPO

## 2023-02-19 DIAGNOSIS — M25512 Pain in left shoulder: Secondary | ICD-10-CM

## 2023-02-19 DIAGNOSIS — G8929 Other chronic pain: Secondary | ICD-10-CM | POA: Diagnosis not present

## 2023-02-19 DIAGNOSIS — M25511 Pain in right shoulder: Secondary | ICD-10-CM

## 2023-02-19 MED ORDER — DEXCOM G7 SENSOR MISC: 1.0000 | 1 refills | Status: DC

## 2023-02-19 MED ORDER — DEXCOM G7 RECEIVER DEVI: 1.0000 | Freq: Every day | 0 refills | Status: AC

## 2023-02-19 NOTE — Addendum Note (Signed)
Addended by: Wendi Maya on: 02/19/2023 11:32 AM   Modules accepted: Orders

## 2023-02-19 NOTE — Progress Notes (Signed)
Office Visit Note   Patient: Benjamin Hale           Date of Birth: 04/11/1966           MRN: [id number] Visit Date: 02/19/2023              Requested by: [name], [name] [address] [place],  [place] [address] PCP: [name], [name]   Assessment & Plan: Visit Diagnoses:  1. Chronic pain of both shoulders     Plan: Benjamin Hale is a 57 year old gentleman with improved left shoulder pain since we last saw him after he had a glenohumeral injection and course of physical therapy and home exercises.  Sounds like he is having the same issue with his right shoulder which is adhesive capsulitis.  He would like to try glenohumeral injection for the right shoulder and he will do his exercises at home.  If this does not improve his symptoms we would move forward with MRIs of both shoulders.  His diabetes has also been under better control recently.  Follow-Up Instructions: No follow-ups on file.   Orders:  Orders Placed This Encounter  Procedures   XR Shoulder Left   XR Shoulder Right   No orders of the defined types were placed in this encounter.     Procedures: No procedures performed   Clinical Data: No additional findings.   Subjective: Chief Complaint  Patient presents with   Right Shoulder - Pain   Left Shoulder - Pain    HPI Benjamin Hale returns today for follow-up for bilateral shoulder pain.  We saw him about a year ago for the left shoulder and sent him to Dr. Shon Baton for intra-articular steroid injection and physical therapy.  He states that overall that his left shoulder feels better from these treatments as well as activity modifications and rest.  His right shoulder has started to hurt him more recently.  The symptoms are reminiscent of the left shoulder. Review of Systems  Constitutional: Negative.   HENT: Negative.    Eyes: Negative.   Respiratory: Negative.    Cardiovascular: Negative.   Gastrointestinal: Negative.   Endocrine:  Negative.   Genitourinary: Negative.   Skin: Negative.   Allergic/Immunologic: Negative.   Neurological: Negative.   Hematological: Negative.   Psychiatric/Behavioral: Negative.    All other systems reviewed and are negative.    Objective: Vital Signs: There were no vitals taken for this visit.  Physical Exam Vitals and nursing note reviewed.  Constitutional:      Appearance: He is well-developed.  Pulmonary:     Effort: Pulmonary effort is normal.  Abdominal:     Palpations: Abdomen is soft.  Skin:    General: Skin is warm.  Neurological:     Mental Status: He is alert and oriented to person, place, and time.  Psychiatric:        Behavior: Behavior normal.        Thought Content: Thought content normal.        Judgment: Judgment normal.     Ortho Exam Examination of left shoulder shows mild restriction in range of motion in all planes with mild pain.  Muscle testing the rotator cuff is normal.  Examination of the right shoulder shows mild restriction range of motion in all planes with moderate pain.  Manual muscle testing of the rotator cuff is normal.  No real impingement signs. Specialty Comments:  No specialty comments available.  Imaging: No results found.   PMFS History: Patient Active  Problem List   Diagnosis Date Noted   Diabetes mellitus treated with oral medication (HCC) 12/05/2022   Chronic left shoulder pain 01/07/2022   Pseudoarthrosis of cervical spine (HCC) 07/19/2021   Restless leg syndrome 01/01/2021   Cervical neuropathic pain 01/01/2021   Depression, major, single episode, moderate (HCC) 06/23/2019   Long-term current use of injectable noninsulin antidiabetic medication 05/01/2014   Essential hypertension 05/01/2014   Hyperlipidemia associated with type 2 diabetes mellitus (HCC) 03/27/2014   Asthma 03/27/2014   GERD (gastroesophageal reflux disease) 03/27/2014   Past Medical History:  Diagnosis Date   Anginal pain (HCC)    Arthritis     "neck" (12/23/2012)   Asthma    Atrial flutter (HCC)    a. s/p cervical spine surgery 4/14 => s/p DCCV;  b.  Echo 09/2012:  Mild LVH, EF 60-65%, Gr 2 DD.c. s/p atrial flutter ablation 12/23/2012 by Dr Johney Frame   Cervical disc disease    s/p cervical spine surgery 4/14   COPD (chronic obstructive pulmonary disease) (HCC)    Diabetes mellitus without complication (HCC)    Dysrhythmia    Exertional shortness of breath    GERD (gastroesophageal reflux disease)    Headache    "weekly" (12/23/2012)   HTN (hypertension)    Hyperlipidemia    Hypogonadism male    Migraines    "once q 3-4 months" (12/23/2012)    Family History  Problem Relation Age of Onset   Lung cancer Mother        died @ 62   Brain cancer Father        died @ 93   Diabetes Maternal Uncle    Diabetes Maternal Grandfather     Past Surgical History:  Procedure Laterality Date   ANTERIOR CERVICAL DECOMP/DISCECTOMY FUSION N/A 09/22/2012   Procedure: ANTERIOR CERVICAL DECOMPRESSION/DISCECTOMY FUSION 2 LEVELS;  Surgeon: Karn Cassis, MD;  Location: [facility];  Service: Neurosurgery;  Laterality: N/A;  Cervical five-six Cervical six-seven Anterior cervical decompression/diskectomy/fusion   ATRIAL FLUTTER ABLATION  12/23/2012   CTI ablation by Dr Johney Frame   ATRIAL FLUTTER ABLATION N/A 12/23/2012   Procedure: ATRIAL FLUTTER ABLATION;  Surgeon: Hillis Range, MD;  Location: [facility] CATH LAB;  Service: Cardiovascular;  Laterality: N/A;   CARDIAC CATHETERIZATION N/A 11/16/2015   Procedure: Left Heart Cath and Coronary Angiography;  Surgeon: Marykay Lex, MD;  Location: [facility] INVASIVE CV LAB;  Service: Cardiovascular;  Laterality: N/A;   CARDIOVERSION N/A 09/23/2012   Procedure: CARDIOVERSION;  Surgeon: Gaylord Shih, MD;  Location: [facility] OR;  Service: Cardiovascular;  Laterality: N/A;   POSTERIOR CERVICAL FUSION/FORAMINOTOMY N/A 07/19/2021   Procedure: Cervical five- six Cervical six-seven Lateral Mass Fusion;  Surgeon: Lisbeth Renshaw, MD;   Location: [facility] OR;  Service: Neurosurgery;  Laterality: N/A;   TONSILLECTOMY  1973   TREATMENT FISTULA ANAL  ~ 2007   WISDOM TOOTH EXTRACTION     Social History   Occupational History   Not on file  Tobacco Use   Smoking status: Former    Current packs/day: 0.00    Average packs/day: 1.5 packs/day for 22.0 years (33.0 ttl pk-yrs)    Types: Cigarettes    Start date: 06/17/1983    Quit date: 06/16/2005    Years since quitting: 17.6   Smokeless tobacco: Never  Vaping Use   Vaping status: Every Day  Substance and Sexual Activity   Alcohol use: Not Currently    Comment: rarely   Drug use: No   Sexual activity:  Yes

## 2023-02-24 ENCOUNTER — Telehealth: Payer: Self-pay | Admitting: *Deleted

## 2023-02-24 MED ORDER — DEXCOM G7 SENSOR MISC: 1 refills | Status: DC

## 2023-02-24 NOTE — Telephone Encounter (Signed)
Fax from [employer] Directions for Dexcom G7 sensor, change Q 10 days not every 14 days Corrected and resent to pharmacy

## 2023-02-26 ENCOUNTER — Ambulatory Visit (INDEPENDENT_AMBULATORY_CARE_PROVIDER_SITE_OTHER): Payer: BC Managed Care – PPO | Admitting: Sports Medicine

## 2023-02-26 ENCOUNTER — Encounter: Payer: Self-pay | Admitting: Sports Medicine

## 2023-02-26 ENCOUNTER — Other Ambulatory Visit: Payer: Self-pay

## 2023-02-26 DIAGNOSIS — M7501 Adhesive capsulitis of right shoulder: Secondary | ICD-10-CM | POA: Diagnosis not present

## 2023-02-26 DIAGNOSIS — M25511 Pain in right shoulder: Secondary | ICD-10-CM | POA: Diagnosis not present

## 2023-02-26 DIAGNOSIS — G8929 Other chronic pain: Secondary | ICD-10-CM | POA: Diagnosis not present

## 2023-02-26 MED ORDER — BUPIVACAINE HCL 0.25 % IJ SOLN
3.0000 mL | INTRAMUSCULAR | Status: AC | PRN
Start: 2023-02-26 — End: 2023-02-26
  Administered 2023-02-26: 3 mL via INTRA_ARTICULAR

## 2023-02-26 MED ORDER — METHYLPREDNISOLONE ACETATE 40 MG/ML IJ SUSP
40.0000 mg | INTRAMUSCULAR | Status: AC | PRN
Start: 2023-02-26 — End: 2023-02-26
  Administered 2023-02-26: 40 mg via INTRA_ARTICULAR

## 2023-02-26 MED ORDER — LIDOCAINE HCL 1 % IJ SOLN
3.0000 mL | INTRAMUSCULAR | Status: AC | PRN
Start: 2023-02-26 — End: 2023-02-26
  Administered 2023-02-26: 3 mL

## 2023-02-26 NOTE — Progress Notes (Signed)
   Procedure Note  Patient: Benjamin Hale             Date of Birth: Jun 06, 1966           MRN: [id number]             Visit Date: 02/26/2023  Procedures: Visit Diagnoses:  1. Adhesive capsulitis of right shoulder   2. Chronic right shoulder pain    Large Joint Inj: R glenohumeral on 02/26/2023 4:43 PM Indications: pain Details: 22 G 3.5 in needle, ultrasound-guided posterior approach Medications: 3 mL lidocaine 1 %; 3 mL bupivacaine 0.25 %; 40 mg methylPREDNISolone acetate 40 MG/ML Outcome: tolerated well, no immediate complications  US-guided glenohumeral joint injection, right shoulder After discussion on risks/benefits/indications, informed verbal consent was obtained. A timeout was then performed. The patient was positioned lying lateral recumbent on examination table. The patient's shoulder was prepped with betadine and multiple alcohol swabs and utilizing ultrasound guidance, the patient's glenohumeral joint was identified on ultrasound. Using ultrasound guidance a 22-gauge, 3.5 inch needle with a mixture of 2:2:1 cc's lidocaine:bupivicaine:depomedrol was directed from a lateral to medial direction via in-plane technique into the glenohumeral joint with visualization of appropriate spread of injectate into the joint. Patient tolerated the procedure well without immediate complications.      Procedure, treatment alternatives, risks and benefits explained, specific risks discussed. Consent was given by the patient. Immediately prior to procedure a time out was called to verify the correct patient, procedure, equipment, support staff and site/side marked as required. Patient was prepped and draped in the usual sterile fashion.     - I evaluated the patient about 5 minutes post-injection and he had fairly good improvement in pain and range of motion - follow-up with Dr. Roda Shutters as indicated; I am happy to see them as needed  Madelyn Brunner, DO Primary Care Sports Medicine Physician   [facility] - Orthopedics  This note was dictated using Dragon naturally speaking software and may contain errors in syntax, spelling, or content which have not been identified prior to signing this note.

## 2023-03-10 ENCOUNTER — Encounter: Payer: Self-pay | Admitting: Nurse Practitioner

## 2023-03-10 ENCOUNTER — Ambulatory Visit: Payer: BC Managed Care – PPO | Admitting: Nurse Practitioner

## 2023-03-10 VITALS — BP 139/80 | HR 56 | Temp 98.2°F | Resp 20 | Ht 70.0 in | Wt 226.0 lb

## 2023-03-10 DIAGNOSIS — G2581 Restless legs syndrome: Secondary | ICD-10-CM

## 2023-03-10 DIAGNOSIS — F411 Generalized anxiety disorder: Secondary | ICD-10-CM

## 2023-03-10 DIAGNOSIS — E119 Type 2 diabetes mellitus without complications: Secondary | ICD-10-CM | POA: Diagnosis not present

## 2023-03-10 DIAGNOSIS — Z7984 Long term (current) use of oral hypoglycemic drugs: Secondary | ICD-10-CM

## 2023-03-10 DIAGNOSIS — E785 Hyperlipidemia, unspecified: Secondary | ICD-10-CM | POA: Diagnosis not present

## 2023-03-10 DIAGNOSIS — I10 Essential (primary) hypertension: Secondary | ICD-10-CM | POA: Diagnosis not present

## 2023-03-10 DIAGNOSIS — E1169 Type 2 diabetes mellitus with other specified complication: Secondary | ICD-10-CM

## 2023-03-10 DIAGNOSIS — K219 Gastro-esophageal reflux disease without esophagitis: Secondary | ICD-10-CM

## 2023-03-10 DIAGNOSIS — Z6831 Body mass index (BMI) 31.0-31.9, adult: Secondary | ICD-10-CM

## 2023-03-10 DIAGNOSIS — F321 Major depressive disorder, single episode, moderate: Secondary | ICD-10-CM | POA: Diagnosis not present

## 2023-03-10 DIAGNOSIS — Z1211 Encounter for screening for malignant neoplasm of colon: Secondary | ICD-10-CM

## 2023-03-10 LAB — BAYER DCA HB A1C WAIVED: HB A1C (BAYER DCA - WAIVED): 7.7 % — ABNORMAL HIGH (ref 4.8–5.6)

## 2023-03-10 LAB — LIPID PANEL

## 2023-03-10 MED ORDER — METFORMIN HCL 1000 MG PO TABS: 1000.0000 mg | ORAL_TABLET | Freq: Two times a day (BID) | ORAL | 1 refills | Status: DC

## 2023-03-10 MED ORDER — FENOFIBRATE 145 MG PO TABS
145.0000 mg | ORAL_TABLET | Freq: Every day | ORAL | 1 refills | Status: DC
Start: 2023-03-10 — End: 2023-08-27

## 2023-03-10 MED ORDER — ESCITALOPRAM OXALATE 10 MG PO TABS: ORAL_TABLET | ORAL | 1 refills | Status: DC

## 2023-03-10 MED ORDER — INSULIN GLARGINE-YFGN 100 UNIT/ML ~~LOC~~ SOLN: 26.0000 [IU] | Freq: Every day | SUBCUTANEOUS | 1 refills | Status: DC

## 2023-03-10 MED ORDER — GLIMEPIRIDE 4 MG PO TABS: 4.0000 mg | ORAL_TABLET | Freq: Every day | ORAL | 1 refills | Status: DC

## 2023-03-10 MED ORDER — METOPROLOL TARTRATE 25 MG PO TABS: 25.0000 mg | ORAL_TABLET | Freq: Two times a day (BID) | ORAL | 1 refills | Status: DC

## 2023-03-10 MED ORDER — LISINOPRIL 40 MG PO TABS
40.0000 mg | ORAL_TABLET | Freq: Every day | ORAL | 1 refills | Status: DC
Start: 2023-03-10 — End: 2023-08-27

## 2023-03-10 MED ORDER — OMEPRAZOLE 20 MG PO CPDR: DELAYED_RELEASE_CAPSULE | ORAL | 1 refills | Status: DC

## 2023-03-10 MED ORDER — DIAZEPAM 5 MG PO TABS
ORAL_TABLET | ORAL | 1 refills | Status: DC
Start: 2023-03-10 — End: 2023-08-27

## 2023-03-10 MED ORDER — AMLODIPINE BESYLATE 10 MG PO TABS
10.0000 mg | ORAL_TABLET | Freq: Every day | ORAL | 1 refills | Status: DC
Start: 2023-03-10 — End: 2023-08-27

## 2023-03-10 NOTE — Progress Notes (Signed)
Subjective:    Patient ID: Benjamin Hale, male    DOB: March 23, 1966, 57 y.o.   MRN: [id number]   Chief Complaint: medical management of chronic issues     HPI:  Benjamin Hale is a 57 y.o. who identifies as a male who was assigned male at birth.   Social history: Lives with: wife and son Work history: [employer]   Comes in today for follow up of the following chronic medical issues:  1. Essential hypertension No c/o chest pain, sob or headache. Does not check blood pressure at home. BP Readings from Last 3 Encounters:  12/05/22 128/79  08/19/22 134/77  05/20/22 125/71     2. Diabetes mellitus treated with oral medication (HCC) Fasting blood sugars are running around above 200 all the time. He was switched form libre to dexcom and the dexcom has been reading high. Lab Results  Component Value Date   HGBA1C 7.3 (H) 12/05/2022     3. Hyperlipidemia associated with type 2 diabetes mellitus (HCC) He does not really wathc his diet and does no dedicated exercise. Lab Results  Component Value Date   CHOL 111 12/05/2022   HDL 19 (L) 12/05/2022   LDLCALC 56 12/05/2022   LDLDIRECT 62 12/05/2022   TRIG 219 (H) 12/05/2022   CHOLHDL 5.8 (H) 12/05/2022     4. Gastroesophageal reflux disease without esophagitis Is on omeprazole and is doing well.  5. Depression, major, single episode, moderate (HCC) Is on lexapro and has no issues.    03/10/2023    8:05 AM 12/05/2022    8:20 AM 08/19/2022    8:06 AM  Depression screen PHQ 2/9  Decreased Interest 0 0 0  Down, Depressed, Hopeless 0 0 0  PHQ - 2 Score 0 0 0  Altered sleeping 0 1 0  Tired, decreased energy 0 0 0  Change in appetite 0 0 0  Feeling bad or failure about yourself  0 0 0  Trouble concentrating 0 0 0  Moving slowly or fidgety/restless 0 0 0  Suicidal thoughts 0 0 0  PHQ-9 Score 0 1 0  Difficult doing work/chores Not difficult at all Not difficult at all Not difficult at all     6. Restless leg  syndrome Takes valium on as needed basis  7. BMI 31.0-31.9,adult No recent weight changes Wt Readings from Last 3 Encounters:  03/10/23 226 lb (102.5 kg)  12/05/22 221 lb (100.2 kg)  08/19/22 217 lb (98.4 kg)   BMI Readings from Last 3 Encounters:  03/10/23 32.43 kg/m  12/05/22 31.71 kg/m  08/19/22 31.14 kg/m      New complaints: None today  No Known Allergies Outpatient Encounter Medications as of 03/10/2023  Medication Sig   amLODipine (NORVASC) 10 MG tablet TAKE 1 TABLET DAILY   aspirin EC 81 MG tablet Take 81 mg by mouth daily.   Continuous Glucose Receiver (DEXCOM G7 RECEIVER) DEVI 1 each by Does not apply route daily.   Continuous Glucose Sensor (DEXCOM G7 SENSOR) MISC Change sensory every 10 days   diazepam (VALIUM) 5 MG tablet TAKE 1 TABLET EVERY 12 HOURS AS NEEDED FOR ANXIETY   escitalopram (LEXAPRO) 10 MG tablet TAKE 1 TABLET DAILY   fenofibrate (TRICOR) 145 MG tablet Take 1 tablet (145 mg total) by mouth daily.   fluticasone (FLONASE) 50 MCG/ACT nasal spray USE 2 SPRAYS IN EACH NOSTRIL DAILY AS NEEDED FOR RHINITIS OR ALLERGIES   glimepiride (AMARYL) 4 MG tablet Take 1 tablet (4 mg  total) by mouth daily before breakfast.   guaiFENesin (MUCINEX) 600 MG 12 hr tablet Take 1 tablet (600 mg total) by mouth 2 (two) times daily.   ibuprofen (ADVIL,MOTRIN) 200 MG tablet Take 600 mg by mouth every 6 (six) hours as needed (For headache or pain.).   insulin glargine-yfgn (SEMGLEE, YFGN,) 100 UNIT/ML injection Inject 0.26 mLs (26 Units total) into the skin daily.   Insulin Pen Needle (BD PEN NEEDLE MICRO U/F) 32G X 6 MM MISC Use to give Semglee as needed   lisinopril (ZESTRIL) 40 MG tablet TAKE 1 TABLET DAILY   metFORMIN (GLUCOPHAGE) 1000 MG tablet TAKE 1 TABLET TWICE A DAY WITH A MEAL   metoprolol tartrate (LOPRESSOR) 25 MG tablet Take 1 tablet (25 mg total) by mouth 2 (two) times daily.   Multiple Vitamin (MULTIVITAMIN WITH MINERALS) TABS tablet Take 1 tablet by mouth daily.    nitroGLYCERIN (NITROSTAT) 0.4 MG SL tablet Place 1 tablet (0.4 mg total) under the tongue every 5 (five) minutes as needed for chest pain.   omega-3 acid ethyl esters (LOVAZA) 1 g capsule Take 1 capsule (1 g total) by mouth 2 (two) times daily.   omeprazole (PRILOSEC) 20 MG capsule TAKE 1 CAPSULE DAILY AS NEEDED FOR ACID REFLUX   Probiotic Product (PROBIOTIC PO) Take 1 capsule by mouth daily.   No facility-administered encounter medications on file as of 03/10/2023.    Past Surgical History:  Procedure Laterality Date   ANTERIOR CERVICAL DECOMP/DISCECTOMY FUSION N/A 09/22/2012   Procedure: ANTERIOR CERVICAL DECOMPRESSION/DISCECTOMY FUSION 2 LEVELS;  Surgeon: Karn Cassis, MD;  Location: [facility] NEURO ORS;  Service: Neurosurgery;  Laterality: N/A;  Cervical five-six Cervical six-seven Anterior cervical decompression/diskectomy/fusion   ATRIAL FLUTTER ABLATION  12/23/2012   CTI ablation by Dr Johney Frame   ATRIAL FLUTTER ABLATION N/A 12/23/2012   Procedure: ATRIAL FLUTTER ABLATION;  Surgeon: Hillis Range, MD;  Location: [facility] CATH LAB;  Service: Cardiovascular;  Laterality: N/A;   CARDIAC CATHETERIZATION N/A 11/16/2015   Procedure: Left Heart Cath and Coronary Angiography;  Surgeon: Marykay Lex, MD;  Location: [facility] INVASIVE CV LAB;  Service: Cardiovascular;  Laterality: N/A;   CARDIOVERSION N/A 09/23/2012   Procedure: CARDIOVERSION;  Surgeon: Gaylord Shih, MD;  Location[facility] OR;  Service: Cardiovascular;  Laterality: N/A;   POSTERIOR CERVICAL FUSION/FORAMINOTOMY N/A 07/19/2021   Procedure: Cervical five- six Cervical six-seven Lateral Mass Fusion;  Surgeon: Lisbeth Renshaw, MD;  Location: [facility] OR;  Service: Neurosurgery;  Laterality: N/A;   TONSILLECTOMY  1973   TREATMENT FISTULA ANAL  ~ 2007   WISDOM TOOTH EXTRACTION      Family History  Problem Relation Age of Onset   Lung cancer Mother        died @ 30   Brain cancer Father        died @ 38   Diabetes Maternal Uncle    Diabetes Maternal  Grandfather       Controlled substance contract: n/a     Review of Systems  Constitutional:  Negative for diaphoresis.  Eyes:  Negative for pain.  Respiratory:  Negative for shortness of breath.   Cardiovascular:  Negative for chest pain, palpitations and leg swelling.  Gastrointestinal:  Negative for abdominal pain.  Endocrine: Negative for polydipsia.  Skin:  Negative for rash.  Neurological:  Negative for dizziness, weakness and headaches.  Hematological:  Does not bruise/bleed easily.  All other systems reviewed and are negative.      Objective:   Physical Exam Vitals  and nursing note reviewed.  Constitutional:      Appearance: Normal appearance. He is well-developed.  HENT:     Head: Normocephalic.     Nose: Nose normal.     Mouth/Throat:     Mouth: Mucous membranes are moist.     Pharynx: Oropharynx is clear.  Eyes:     Pupils: Pupils are equal, round, and reactive to light.  Neck:     Thyroid: No thyroid mass or thyromegaly.     Vascular: No carotid bruit or JVD.     Trachea: Phonation normal.  Cardiovascular:     Rate and Rhythm: Normal rate and regular rhythm.  Pulmonary:     Effort: Pulmonary effort is normal. No respiratory distress.     Breath sounds: Normal breath sounds.  Abdominal:     General: Bowel sounds are normal.     Palpations: Abdomen is soft.     Tenderness: There is no abdominal tenderness.  Musculoskeletal:        General: Normal range of motion.     Cervical back: Normal range of motion and neck supple.  Lymphadenopathy:     Cervical: No cervical adenopathy.  Skin:    General: Skin is warm and dry.  Neurological:     Mental Status: He is alert and oriented to person, place, and time.  Psychiatric:        Behavior: Behavior normal.        Thought Content: Thought content normal.        Judgment: Judgment normal.    BP 139/80   Pulse (!) 56   Temp 98.2 F (36.8 C) (Temporal)   Resp 20   Ht 5\' 10"  (1.778 m)   Wt 226 lb  (102.5 kg)   SpO2 92%   BMI 32.43 kg/m  % Hgba1c 7.7       Assessment & Plan:  Benjamin Hale comes in today with chief complaint of Medical Management of Chronic Issues   Diagnosis and orders addressed:  1. Essential hypertension Low sodium diet - CBC with Differential/Platelet - CMP14+EGFR - amLODipine (NORVASC) 10 MG tablet; Take 1 tablet (10 mg total) by mouth daily.  Dispense: 90 tablet; Refill: 1 - lisinopril (ZESTRIL) 40 MG tablet; Take 1 tablet (40 mg total) by mouth daily.  Dispense: 90 tablet; Refill: 1 - metoprolol tartrate (LOPRESSOR) 25 MG tablet; Take 1 tablet (25 mg total) by mouth 2 (two) times daily.  Dispense: 180 tablet; Refill: 1  2. Diabetes mellitus treated with oral medication (HCC) Stricter carb counting Compare dexcom readings to finger sticks - Bayer DCA Hb A1c Waived - glimepiride (AMARYL) 4 MG tablet; Take 1 tablet (4 mg total) by mouth daily before breakfast.  Dispense: 90 tablet; Refill: 1 - insulin glargine-yfgn (SEMGLEE, YFGN,) 100 UNIT/ML injection; Inject 0.26 mLs (26 Units total) into the skin daily.  Dispense: 30 mL; Refill: 1 - metFORMIN (GLUCOPHAGE) 1000 MG tablet; Take 1 tablet (1,000 mg total) by mouth 2 (two) times daily with a meal.  Dispense: 180 tablet; Refill: 1  3. Hyperlipidemia associated with type 2 diabetes mellitus (HCC) Low fat diet - Lipid panel - fenofibrate (TRICOR) 145 MG tablet; Take 1 tablet (145 mg total) by mouth daily.  Dispense: 90 tablet; Refill: 1  4. Gastroesophageal reflux disease without esophagitis Avoid spicy foods Do not eat 2 hours prior to bedtime - omeprazole (PRILOSEC) 20 MG capsule; TAKE 1 CAPSULE DAILY AS NEEDED FOR ACID REFLUX  Dispense: 90 capsule; Refill: 1  5. Depression, major, single episode, moderate (HCC) Stress management - escitalopram (LEXAPRO) 10 MG tablet; TAKE 1 TABLET DAILY  Dispense: 90 tablet; Refill: 1  6. Restless leg syndrome  7. BMI 31.0-31.9,adult Discussed diet and  exercise for person with BMI >25 Will recheck weight in 3-6 months   8. GAD (generalized anxiety disorder) Stress management - diazepam (VALIUM) 5 MG tablet; TAKE 1 TABLET EVERY 12 HOURS AS NEEDED FOR ANXIETY  Dispense: 90 tablet; Refill: 1  9. Colon cancer screening - Cologuard   Labs pending Health Maintenance reviewed Diet and exercise encouraged  Follow up plan: 3 months   Mary-Margaret Daphine Deutscher, [name]

## 2023-03-10 NOTE — Patient Instructions (Signed)

## 2023-03-11 LAB — CBC WITH DIFFERENTIAL/PLATELET
Basophils Absolute: 0.1 10*3/uL (ref 0.0–0.2)
Basos: 1 %
EOS (ABSOLUTE): 0.2 10*3/uL (ref 0.0–0.4)
Eos: 2 %
Hematocrit: 44.4 % (ref 37.5–51.0)
Hemoglobin: 15.3 g/dL (ref 13.0–17.7)
Immature Grans (Abs): 0 10*3/uL (ref 0.0–0.1)
Immature Granulocytes: 0 %
Lymphocytes Absolute: 2.2 10*3/uL (ref 0.7–3.1)
Lymphs: 31 %
MCH: 30 pg (ref 26.6–33.0)
MCHC: 34.5 g/dL (ref 31.5–35.7)
MCV: 87 fL (ref 79–97)
Monocytes Absolute: 0.7 10*3/uL (ref 0.1–0.9)
Monocytes: 9 %
Neutrophils Absolute: 4.1 10*3/uL (ref 1.4–7.0)
Neutrophils: 57 %
Platelets: 226 10*3/uL (ref 150–450)
RBC: 5.1 x10E6/uL (ref 4.14–5.80)
RDW: 13.3 % (ref 11.6–15.4)
WBC: 7.2 10*3/uL (ref 3.4–10.8)

## 2023-03-11 LAB — CMP14+EGFR
ALT: 35 IU/L (ref 0–44)
AST: 27 IU/L (ref 0–40)
Albumin: 4.1 g/dL (ref 3.8–4.9)
Alkaline Phosphatase: 72 IU/L (ref 44–121)
BUN/Creatinine Ratio: 19 (ref 9–20)
BUN: 17 mg/dL (ref 6–24)
Bilirubin Total: 0.7 mg/dL (ref 0.0–1.2)
CO2: 20 mmol/L (ref 20–29)
Calcium: 9.2 mg/dL (ref 8.7–10.2)
Chloride: 100 mmol/L (ref 96–106)
Creatinine, Ser: 0.89 mg/dL (ref 0.76–1.27)
Globulin, Total: 2.8 g/dL (ref 1.5–4.5)
Glucose: 240 mg/dL — ABNORMAL HIGH (ref 70–99)
Potassium: 4.6 mmol/L (ref 3.5–5.2)
Sodium: 139 mmol/L (ref 134–144)
Total Protein: 6.9 g/dL (ref 6.0–8.5)
eGFR: 101 mL/min/{1.73_m2} (ref >59)

## 2023-03-11 LAB — LIPID PANEL
Cholesterol, Total: 120 mg/dL (ref 100–199)
HDL: 20 mg/dL — ABNORMAL LOW (ref >39)
LDL CALC COMMENT:: 6 ratio — ABNORMAL HIGH (ref 0.0–5.0)
LDL Chol Calc (NIH): 56 mg/dL (ref 0–99)
Triglycerides: 276 mg/dL — ABNORMAL HIGH (ref 0–149)
VLDL Cholesterol Cal: 44 mg/dL — ABNORMAL HIGH (ref 5–40)

## 2023-03-17 ENCOUNTER — Other Ambulatory Visit: Payer: Self-pay | Admitting: Nurse Practitioner

## 2023-03-17 DIAGNOSIS — Z1211 Encounter for screening for malignant neoplasm of colon: Secondary | ICD-10-CM | POA: Diagnosis not present

## 2023-03-23 LAB — COLOGUARD: COLOGUARD: NEGATIVE

## 2023-04-24 ENCOUNTER — Ambulatory Visit: Payer: Self-pay | Admitting: Nurse Practitioner

## 2023-04-24 ENCOUNTER — Ambulatory Visit: Payer: BC Managed Care – PPO | Admitting: Nurse Practitioner

## 2023-04-24 ENCOUNTER — Encounter: Payer: Self-pay | Admitting: Nurse Practitioner

## 2023-04-24 VITALS — BP 145/74 | HR 61 | Temp 98.3°F | Resp 20 | Ht 70.0 in | Wt 228.0 lb

## 2023-04-24 DIAGNOSIS — J40 Bronchitis, not specified as acute or chronic: Secondary | ICD-10-CM | POA: Diagnosis not present

## 2023-04-24 MED ORDER — AZITHROMYCIN 250 MG PO TABS
ORAL_TABLET | ORAL | 0 refills | Status: DC
Start: 2023-04-24 — End: 2023-05-12

## 2023-04-24 MED ORDER — BENZONATATE 100 MG PO CAPS: 100.0000 mg | ORAL_CAPSULE | Freq: Three times a day (TID) | ORAL | 0 refills | Status: DC | PRN

## 2023-04-24 NOTE — Telephone Encounter (Signed)
  Chief Complaint: chest tightness Symptoms: chest tightness, low grade fever, cough Frequency: constant Pertinent Negatives: Patient denies oxygen desaturation, pain radiation, coughing up blood Disposition: [] ED /[] Urgent Care (no appt availability in office) / [x] Appointment(In office/virtual)/ []  [place] [facility]/ [] Home Care/ [] Refused Recommended Disposition /[] [place] [facility]/ []  Follow-up with PCP Additional Notes: Patient reports he is having chest tightness, cough, and fever for the past week and a half that has gotten worse over the past few days. Patient says he has been monitoring his o2 saturations and they have not dropped below 96. Patient reports his symptoms are the same as when he had bronchitis and he was requesting an appointment for antibiotics. Appointment scheduled for today 11/8.    Copied from CRM [id number]. Topic: Clinical - Red Word Triage >> Apr 24, 2023  9:41 AM Deaijah H wrote: Red Word that prompted transfer to Nurse Triage:  painful cough headache low grade fever tight chest  Reason for Disposition  Fever > 100.4 F (38.0 C)  Answer Assessment - Initial Assessment Questions 1. LOCATION: "Where does it hurt?"       "Entire chest" 2. RADIATION: "Does the pain go anywhere else?" (e.g., into neck, jaw, arms, back)     no 3. ONSET: "When did the chest pain begin?" (Minutes, hours or days)      Week and a half but worse the past few days 4. PATTERN: "Does the pain come and go, or has it been constant since it started?"  "Does it get worse with exertion?"      Constant, worse when your coughing 5. DURATION: "How long does it last" (e.g., seconds, minutes, hours)     constant 6. SEVERITY: "How bad is the pain?"  (e.g., Scale 1-10; mild, moderate, or severe)    - MILD (1-3): doesn't interfere with normal activities     - MODERATE (4-7): interferes with normal activities or awakens from sleep    - SEVERE (8-10): excruciating pain, unable to do any  normal activities       moderate 7. CARDIAC RISK FACTORS: "Do you have any history of heart problems or risk factors for heart disease?" (e.g., angina, prior heart attack; diabetes, high blood pressure, high cholesterol, smoker, or strong family history of heart disease)     High blood pressure, diabetic, hx of afib, hx of heart ablation 8. PULMONARY RISK FACTORS: "Do you have any history of lung disease?"  (e.g., blood clots in lung, asthma, emphysema, birth control pills)     Asthma, hx of bronchitis 9. CAUSE: "What do you think is causing the chest pain?"     This feels like when I last had bronchitis 10. OTHER SYMPTOMS: "Do you have any other symptoms?" (e.g., dizziness, nausea, vomiting, sweating, fever, difficulty breathing, cough)       Low grade fever, chest congestion, chest tightness, productive cough  Protocols used: Chest Pain-A-AH

## 2023-04-24 NOTE — Progress Notes (Signed)
Subjective:    Patient ID: Benjamin Hale, male    DOB: Nov 25, 1965, 57 y.o.   MRN: [id number]   Chief Complaint: Cough (Productive for 1 1/2 weeks)   Cough This is a new problem. The current episode started 1 to 4 weeks ago. The problem has been waxing and waning. The problem occurs every few minutes. The cough is Productive of sputum. Associated symptoms include a fever (low grade 99.5) and rhinorrhea. Pertinent negatives include no chills, ear congestion, ear pain, sore throat or shortness of breath. Nothing aggravates the symptoms. He has tried OTC cough suppressant for the symptoms. The treatment provided mild relief.     Patient Active Problem List   Diagnosis Date Noted   Diabetes mellitus treated with oral medication (HCC) 12/05/2022   Chronic left shoulder pain 01/07/2022   Pseudoarthrosis of cervical spine (HCC) 07/19/2021   Restless leg syndrome 01/01/2021   Cervical neuropathic pain 01/01/2021   Depression, major, single episode, moderate (HCC) 06/23/2019   Long-term current use of injectable noninsulin antidiabetic medication 05/01/2014   Essential hypertension 05/01/2014   Hyperlipidemia associated with type 2 diabetes mellitus (HCC) 03/27/2014   Asthma 03/27/2014   GERD (gastroesophageal reflux disease) 03/27/2014       Review of Systems  Constitutional:  Positive for fever (low grade 99.5). Negative for chills.  HENT:  Positive for rhinorrhea. Negative for ear pain and sore throat.   Respiratory:  Positive for cough. Negative for shortness of breath.        Objective:   Physical Exam Vitals and nursing note reviewed.  Constitutional:      Appearance: Normal appearance. He is well-developed.  HENT:     Head: Normocephalic.     Nose: Congestion and rhinorrhea present.     Mouth/Throat:     Mouth: Mucous membranes are moist.     Pharynx: Oropharynx is clear.  Eyes:     Pupils: Pupils are equal, round, and reactive to light.  Neck:     Thyroid: No  thyroid mass or thyromegaly.     Vascular: No carotid bruit or JVD.     Trachea: Phonation normal.  Cardiovascular:     Rate and Rhythm: Normal rate and regular rhythm.  Pulmonary:     Effort: Pulmonary effort is normal. No respiratory distress.     Breath sounds: Rhonchi present.     Comments: Deep dry cough Abdominal:     General: Bowel sounds are normal.     Palpations: Abdomen is soft.     Tenderness: There is no abdominal tenderness.  Musculoskeletal:        General: Normal range of motion.     Cervical back: Normal range of motion and neck supple.  Lymphadenopathy:     Cervical: No cervical adenopathy.  Skin:    General: Skin is warm and dry.  Neurological:     Mental Status: He is alert and oriented to person, place, and time.  Psychiatric:        Behavior: Behavior normal.        Thought Content: Thought content normal.        Judgment: Judgment normal.    BP (!) 145/74   Pulse 61   Temp 98.3 F (36.8 C) (Temporal)   Resp 20   Ht 5\' 10"  (1.778 m)   Wt 228 lb (103.4 kg)   SpO2 95%   BMI 32.71 kg/m         Assessment & Plan:   Benjamin Laperriere  Hale in today with chief complaint of Cough (Productive for 1 1/2 weeks)   1. Bronchitis 1. Take meds as prescribed 2. Use a cool mist humidifier especially during the winter months and when heat has been humid. 3. Use saline nose sprays frequently 4. Saline irrigations of the nose can be very helpful if done frequently.  * 4X daily for 1 week*  * Use of a nettie pot can be helpful with this. Follow directions with this* 5. Drink plenty of fluids 6. Keep thermostat turn down low 7.For any cough or congestion- tessalon perles 8. For fever or aces or pains- take tylenol or ibuprofen appropriate for age and weight.  * for fevers greater than 101 orally you may alternate ibuprofen and tylenol every  3 hours.    - azithromycin (ZITHROMAX Z-PAK) 250 MG tablet; As directed  Dispense: 6 tablet; Refill: 0 - benzonatate  (TESSALON PERLES) 100 MG capsule; Take 1 capsule (100 mg total) by mouth 3 (three) times daily as needed for cough.  Dispense: 20 capsule; Refill: 0    The above assessment and management plan was discussed with the patient. The patient verbalized understanding of and has agreed to the management plan. Patient is aware to call the clinic if symptoms persist or worsen. Patient is aware when to return to the clinic for a follow-up visit. Patient educated on when it is appropriate to go to the emergency department.   Mary-Margaret Daphine Deutscher, [name]

## 2023-04-24 NOTE — Patient Instructions (Signed)

## 2023-05-12 ENCOUNTER — Ambulatory Visit: Payer: Self-pay | Admitting: Nurse Practitioner

## 2023-05-12 ENCOUNTER — Telehealth: Payer: BC Managed Care – PPO | Admitting: Physician Assistant

## 2023-05-12 DIAGNOSIS — B9689 Other specified bacterial agents as the cause of diseases classified elsewhere: Secondary | ICD-10-CM

## 2023-05-12 DIAGNOSIS — J019 Acute sinusitis, unspecified: Secondary | ICD-10-CM

## 2023-05-12 MED ORDER — AMOXICILLIN-POT CLAVULANATE 875-125 MG PO TABS
1.0000 | ORAL_TABLET | Freq: Two times a day (BID) | ORAL | 0 refills | Status: DC
Start: 2023-05-12 — End: 2023-06-08

## 2023-05-12 MED ORDER — ALBUTEROL SULFATE HFA 108 (90 BASE) MCG/ACT IN AERS
2.0000 | INHALATION_SPRAY | Freq: Four times a day (QID) | RESPIRATORY_TRACT | 0 refills | Status: AC | PRN
Start: 2023-05-12 — End: ?

## 2023-05-12 MED ORDER — PROMETHAZINE-DM 6.25-15 MG/5ML PO SYRP
5.0000 mL | ORAL_SOLUTION | Freq: Four times a day (QID) | ORAL | 0 refills | Status: DC | PRN
Start: 2023-05-12 — End: 2023-06-08

## 2023-05-12 NOTE — Telephone Encounter (Signed)
Copied from CRM [id number]. Topic: Clinical - Red Word Triage >> May 12, 2023  9:14 AM Dennison Nancy wrote: Red Word that prompted transfer to Nurse Triage: Redword Shortness of Breath, headache ,chest congestion, aches and pains, sinus pressure and tired . Call back # [phone]   Chief Complaint: Nasal congestion Symptoms: sinus pain and cough, fever and body aches Frequency: started 3 wks ago, not getting any better even after taking ABX Pertinent Negatives: Patient denies  Disposition: [] ED /[] Urgent Care (no appt availability in office) / [] Appointment(In office/virtual)/ [x]  [place] [facility]/ [] Home Care/ [] Refused Recommended Disposition /[] [place] [facility]/ []  Follow-up with PCP Additional Notes: Recently diagnosed with bronchitis on 11/8, was prescribed a Azithromycin and Tessalon sts that cough has gotten a little better, but nasal congestion and sinus pain still persists.  Virtual care visit scheduled for 11/26 at 11:15am   Reason for Disposition  [1] Sinus congestion (pressure, fullness) AND [2] present > 10 days  Answer Assessment - Initial Assessment Questions 1. LOCATION: "Where does it hurt?"      Starts in the ear and shoots thru ear canal into sinuses, left side hurts more.   2. ONSET: "When did the sinus pain start?"  (e.g., hours, days)      Started months ago and has been a recurring problem since the fall, was seen by ENT doctor was advised to  take flonase and zyrtek; this episode  started 3 weeks  3. SEVERITY: "How bad is the pain?"   (Scale 1-10; mild, moderate or severe)   - MILD (1-3): doesn't interfere with normal activities    - MODERATE (4-7): interferes with normal activities (e.g., work or school) or awakens from sleep   - SEVERE (8-10): excruciating pain and patient unable to do any normal activities        8/10 when not taking flonase to keep sinuses clear.   4. RECURRENT SYMPTOM: "Have you ever had sinus problems before?" If Yes, ask: "When was  the last time?" and "What happened that time?"      Happens 1-2x a year  5. NASAL CONGESTION: "Is the nose blocked?" If Yes, ask: "Can you open it or must you breathe through your mouth?"     Nose feels stuffy  6. NASAL DISCHARGE: "Do you have discharge from your nose?" If so ask, "What color?"     Nothing comes out, sinuses feel swollen  7. FEVER: "Do you have a fever?" If Yes, ask: "What is it, how was it measured, and when did it start?"      Low Grade fever,   8. OTHER SYMPTOMS: "Do you have any other symptoms?" (e.g., sore throat, cough, earache, difficulty breathing)     Productive Cough, chest congestion, body aches, and tired  Protocols used: Sinus Pain or Congestion-A-AH

## 2023-05-12 NOTE — Progress Notes (Signed)
Virtual Visit Consent   Benjamin Hale, you are scheduled for a virtual visit with a  provider today. Just as with appointments in the office, your consent must be obtained to participate. Your consent will be active for this visit and any virtual visit you may have with one of our providers in the next 365 days. If you have a MyChart account, a copy of this consent can be sent to you electronically.  As this is a virtual visit, video technology does not allow for your provider to perform a traditional examination. This may limit your provider's ability to fully assess your condition. If your provider identifies any concerns that need to be evaluated in person or the need to arrange testing (such as labs, EKG, etc.), we will make arrangements to do so. Although advances in technology are sophisticated, we cannot ensure that it will always work on either your end or our end. If the connection with a video visit is poor, the visit may have to be switched to a telephone visit. With either a video or telephone visit, we are not always able to ensure that we have a secure connection.  By engaging in this virtual visit, you consent to the provision of healthcare and authorize for your insurance to be billed (if applicable) for the services provided during this visit. Depending on your insurance coverage, you may receive a charge related to this service.  I need to obtain your verbal consent now. Are you willing to proceed with your visit today? Benjamin Hale has provided verbal consent on 05/12/2023 for a virtual visit (video or telephone). Piedad Climes, [place]  Date: 05/12/2023 11:18 AM  Virtual Visit via Video Note   I, Piedad Climes, connected with  Benjamin Hale  ([id number], [birth year]) on 05/12/23 at 11:15 AM EST by a video-enabled telemedicine application and verified that I am speaking with the correct person using two identifiers.  Location: Patient: Virtual  Visit Location Patient: Home Provider: Virtual Visit Location Provider: Home Office   I discussed the limitations of evaluation and management by telemedicine and the availability of in person appointments. The patient expressed understanding and agreed to proceed.    History of Present Illness: Benjamin Hale is a 57 y.o. who identifies as a male who was assigned male at birth, and is being seen today for possible sinus infection. Patient endorses symptoms endorses had been dealing with minor symptoms that he chalked up to allergic inflammation for the past several months (has been evaluated by ENT and started on allergy regimen -- fluticasone and antihistamine). Past 3.5 weeks noting increased nasal congestion, sinus pressure, sinus pain and productive cough. Was initially evaluated on 11/8 and diagnosed with acute bronchitis and started on Azithromycin and Tessalon Perles in addition to OTC Mucinex-DM. Notes this helped with chest symptoms. No longer with productive cough but still persistent dry cough, but worsening of sinus pain and facial pain with continued sinus congestion. Notes occasional chest tightness.   HPI: HPI  Problems:  Patient Active Problem List   Diagnosis Date Noted   Diabetes mellitus treated with oral medication (HCC) 12/05/2022   Chronic left shoulder pain 01/07/2022   Pseudoarthrosis of cervical spine (HCC) 07/19/2021   Restless leg syndrome 01/01/2021   Cervical neuropathic pain 01/01/2021   Depression, major, single episode, moderate (HCC) 06/23/2019   Long-term current use of injectable noninsulin antidiabetic medication 05/01/2014   Essential hypertension 05/01/2014   Hyperlipidemia associated with type 2 diabetes  mellitus (HCC) 03/27/2014   Asthma 03/27/2014   GERD (gastroesophageal reflux disease) 03/27/2014    Allergies: No Known Allergies Medications:  Current Outpatient Medications:    albuterol (VENTOLIN HFA) 108 (90 Base) MCG/ACT inhaler, Inhale 2  puffs into the lungs every 6 (six) hours as needed for wheezing or shortness of breath., Disp: 8 g, Rfl: 0   amoxicillin-clavulanate (AUGMENTIN) 875-125 MG tablet, Take 1 tablet by mouth 2 (two) times daily., Disp: 14 tablet, Rfl: 0   promethazine-dextromethorphan (PROMETHAZINE-DM) 6.25-15 MG/5ML syrup, Take 5 mLs by mouth 4 (four) times daily as needed for cough., Disp: 118 mL, Rfl: 0   amLODipine (NORVASC) 10 MG tablet, Take 1 tablet (10 mg total) by mouth daily., Disp: 90 tablet, Rfl: 1   aspirin EC 81 MG tablet, Take 81 mg by mouth daily., Disp: , Rfl:    Continuous Glucose Receiver (DEXCOM G7 RECEIVER) DEVI, 1 each by Does not apply route daily., Disp: 1 each, Rfl: 0   Continuous Glucose Sensor (DEXCOM G7 SENSOR) MISC, Change sensory every 10 days, Disp: 9 each, Rfl: 1   diazepam (VALIUM) 5 MG tablet, TAKE 1 TABLET EVERY 12 HOURS AS NEEDED FOR ANXIETY, Disp: 90 tablet, Rfl: 1   escitalopram (LEXAPRO) 10 MG tablet, TAKE 1 TABLET DAILY, Disp: 90 tablet, Rfl: 1   fenofibrate (TRICOR) 145 MG tablet, Take 1 tablet (145 mg total) by mouth daily., Disp: 90 tablet, Rfl: 1   fluticasone (FLONASE) 50 MCG/ACT nasal spray, USE 2 SPRAYS IN EACH NOSTRIL DAILY AS NEEDED FOR RHINITIS OR ALLERGIES, Disp: 48 g, Rfl: 3   glimepiride (AMARYL) 4 MG tablet, Take 1 tablet (4 mg total) by mouth daily before breakfast., Disp: 90 tablet, Rfl: 1   guaiFENesin (MUCINEX) 600 MG 12 hr tablet, Take 1 tablet (600 mg total) by mouth 2 (two) times daily., Disp: 30 tablet, Rfl: 0   ibuprofen (ADVIL,MOTRIN) 200 MG tablet, Take 600 mg by mouth every 6 (six) hours as needed (For headache or pain.)., Disp: , Rfl:    insulin glargine-yfgn (SEMGLEE, YFGN,) 100 UNIT/ML injection, Inject 0.26 mLs (26 Units total) into the skin daily., Disp: 30 mL, Rfl: 1   Insulin Pen Needle (BD PEN NEEDLE MICRO U/F) 32G X 6 MM MISC, USE TO GIVE SEMGLEE AS NEEDED, Disp: 100 each, Rfl: 3   lisinopril (ZESTRIL) 40 MG tablet, Take 1 tablet (40 mg total) by  mouth daily., Disp: 90 tablet, Rfl: 1   metFORMIN (GLUCOPHAGE) 1000 MG tablet, Take 1 tablet (1,000 mg total) by mouth 2 (two) times daily with a meal., Disp: 180 tablet, Rfl: 1   metoprolol tartrate (LOPRESSOR) 25 MG tablet, Take 1 tablet (25 mg total) by mouth 2 (two) times daily., Disp: 180 tablet, Rfl: 1   Multiple Vitamin (MULTIVITAMIN WITH MINERALS) TABS tablet, Take 1 tablet by mouth daily., Disp: , Rfl:    nitroGLYCERIN (NITROSTAT) 0.4 MG SL tablet, Place 1 tablet (0.4 mg total) under the tongue every 5 (five) minutes as needed for chest pain., Disp: 25 tablet, Rfl: 3   omega-3 acid ethyl esters (LOVAZA) 1 g capsule, Take 1 capsule (1 g total) by mouth 2 (two) times daily., Disp: 180 capsule, Rfl: 3   omeprazole (PRILOSEC) 20 MG capsule, TAKE 1 CAPSULE DAILY AS NEEDED FOR ACID REFLUX, Disp: 90 capsule, Rfl: 1   Probiotic Product (PROBIOTIC PO), Take 1 capsule by mouth daily., Disp: , Rfl:   Observations/Objective: Patient is well-developed, well-nourished in no acute distress.  Resting comfortably at home.  Head is normocephalic, atraumatic.  No labored breathing. Speech is clear and coherent with logical content.  Patient is alert and oriented at baseline.   Assessment and Plan: 1. Acute bacterial sinusitis - amoxicillin-clavulanate (AUGMENTIN) 875-125 MG tablet; Take 1 tablet by mouth 2 (two) times daily.  Dispense: 14 tablet; Refill: 0 - promethazine-dextromethorphan (PROMETHAZINE-DM) 6.25-15 MG/5ML syrup; Take 5 mLs by mouth 4 (four) times daily as needed for cough.  Dispense: 118 mL; Refill: 0 - albuterol (VENTOLIN HFA) 108 (90 Base) MCG/ACT inhaler; Inhale 2 puffs into the lungs every 6 (six) hours as needed for wheezing or shortness of breath.  Dispense: 8 g; Refill: 0  Rx Augmentin.  Increase fluids.  Rest.  Saline nasal spray.  Probiotic.  Mucinex as directed.  Humidifier in bedroom. Stop the Tessalon. Start the promethazine-dm syrup and Albuterol per orders.  Call or return  to clinic if symptoms are not improving.   Follow Up Instructions: I discussed the assessment and treatment plan with the patient. The patient was provided an opportunity to ask questions and all were answered. The patient agreed with the plan and demonstrated an understanding of the instructions.  A copy of instructions were sent to the patient via MyChart unless otherwise noted below.   The patient was advised to call back or seek an in-person evaluation if the symptoms worsen or if the condition fails to improve as anticipated.    Piedad Climes, PA-C

## 2023-05-12 NOTE — Patient Instructions (Signed)
Benjamin Hale, thank you for joining Benjamin Climes, PA-C for today's virtual visit.  While this provider is not your primary care provider (PCP), if your PCP is located in our provider database this encounter information will be shared with them immediately following your visit.   A [facility] MyChart account gives you access to today's visit and all your visits, tests, and labs performed at [facility] " click here if you don't have a  MyChart account or go to [email or web address]  Consent: (Patient) Benjamin Hale provided verbal consent for this virtual visit at the beginning of the encounter.  Current Medications:  Current Outpatient Medications:    albuterol (VENTOLIN HFA) 108 (90 Base) MCG/ACT inhaler, Inhale 2 puffs into the lungs every 6 (six) hours as needed for wheezing or shortness of breath., Disp: 8 g, Rfl: 0   amoxicillin-clavulanate (AUGMENTIN) 875-125 MG tablet, Take 1 tablet by mouth 2 (two) times daily., Disp: 14 tablet, Rfl: 0   promethazine-dextromethorphan (PROMETHAZINE-DM) 6.25-15 MG/5ML syrup, Take 5 mLs by mouth 4 (four) times daily as needed for cough., Disp: 118 mL, Rfl: 0   amLODipine (NORVASC) 10 MG tablet, Take 1 tablet (10 mg total) by mouth daily., Disp: 90 tablet, Rfl: 1   aspirin EC 81 MG tablet, Take 81 mg by mouth daily., Disp: , Rfl:    Continuous Glucose Receiver (DEXCOM G7 RECEIVER) DEVI, 1 each by Does not apply route daily., Disp: 1 each, Rfl: 0   Continuous Glucose Sensor (DEXCOM G7 SENSOR) MISC, Change sensory every 10 days, Disp: 9 each, Rfl: 1   diazepam (VALIUM) 5 MG tablet, TAKE 1 TABLET EVERY 12 HOURS AS NEEDED FOR ANXIETY, Disp: 90 tablet, Rfl: 1   escitalopram (LEXAPRO) 10 MG tablet, TAKE 1 TABLET DAILY, Disp: 90 tablet, Rfl: 1   fenofibrate (TRICOR) 145 MG tablet, Take 1 tablet (145 mg total) by mouth daily., Disp: 90 tablet, Rfl: 1   fluticasone (FLONASE) 50 MCG/ACT nasal spray, USE 2 SPRAYS IN EACH  NOSTRIL DAILY AS NEEDED FOR RHINITIS OR ALLERGIES, Disp: 48 g, Rfl: 3   glimepiride (AMARYL) 4 MG tablet, Take 1 tablet (4 mg total) by mouth daily before breakfast., Disp: 90 tablet, Rfl: 1   guaiFENesin (MUCINEX) 600 MG 12 hr tablet, Take 1 tablet (600 mg total) by mouth 2 (two) times daily., Disp: 30 tablet, Rfl: 0   ibuprofen (ADVIL,MOTRIN) 200 MG tablet, Take 600 mg by mouth every 6 (six) hours as needed (For headache or pain.)., Disp: , Rfl:    insulin glargine-yfgn (SEMGLEE, YFGN,) 100 UNIT/ML injection, Inject 0.26 mLs (26 Units total) into the skin daily., Disp: 30 mL, Rfl: 1   Insulin Pen Needle (BD PEN NEEDLE MICRO U/F) 32G X 6 MM MISC, USE TO GIVE SEMGLEE AS NEEDED, Disp: 100 each, Rfl: 3   lisinopril (ZESTRIL) 40 MG tablet, Take 1 tablet (40 mg total) by mouth daily., Disp: 90 tablet, Rfl: 1   metFORMIN (GLUCOPHAGE) 1000 MG tablet, Take 1 tablet (1,000 mg total) by mouth 2 (two) times daily with a meal., Disp: 180 tablet, Rfl: 1   metoprolol tartrate (LOPRESSOR) 25 MG tablet, Take 1 tablet (25 mg total) by mouth 2 (two) times daily., Disp: 180 tablet, Rfl: 1   Multiple Vitamin (MULTIVITAMIN WITH MINERALS) TABS tablet, Take 1 tablet by mouth daily., Disp: , Rfl:    nitroGLYCERIN (NITROSTAT) 0.4 MG SL tablet, Place 1 tablet (0.4 mg total) under the tongue every 5 (five) minutes as needed for chest  pain., Disp: 25 tablet, Rfl: 3   omega-3 acid ethyl esters (LOVAZA) 1 g capsule, Take 1 capsule (1 g total) by mouth 2 (two) times daily., Disp: 180 capsule, Rfl: 3   omeprazole (PRILOSEC) 20 MG capsule, TAKE 1 CAPSULE DAILY AS NEEDED FOR ACID REFLUX, Disp: 90 capsule, Rfl: 1   Probiotic Product (PROBIOTIC PO), Take 1 capsule by mouth daily., Disp: , Rfl:    Medications ordered in this encounter:  Meds ordered this encounter  Medications   amoxicillin-clavulanate (AUGMENTIN) 875-125 MG tablet    Sig: Take 1 tablet by mouth 2 (two) times daily.    Dispense:  14 tablet    Refill:  0    Order  Specific Question:   Supervising Provider    Answer:   Merrilee Jansky [id number]   promethazine-dextromethorphan (PROMETHAZINE-DM) 6.25-15 MG/5ML syrup    Sig: Take 5 mLs by mouth 4 (four) times daily as needed for cough.    Dispense:  118 mL    Refill:  0    Order Specific Question:   Supervising Provider    Answer:   Merrilee Jansky [[id number]]   albuterol (VENTOLIN HFA) 108 (90 Base) MCG/ACT inhaler    Sig: Inhale 2 puffs into the lungs every 6 (six) hours as needed for wheezing or shortness of breath.    Dispense:  8 g    Refill:  0    Order Specific Question:   Supervising Provider    Answer:   Merrilee Jansky [id number]     *If you need refills on other medications prior to your next appointment, please contact your pharmacy*  Follow-Up: Call back or seek an in-person evaluation if the symptoms worsen or if the condition fails to improve as anticipated.  [facility] [phone]  Other Instructions Take antibiotic (Au) as directed.  Increase fluids.  Get plenty of rest. Use Mucinex for congestion. Use the albuterol and cough syrup as directed. Take a daily probiotic (I recommend Align or Culturelle, but even Activia Yogurt may be beneficial).  A humidifier placed in the bedroom may offer some relief for a dry, scratchy throat of nasal irritation.  Read information below on acute bronchitis. Please call or return to clinic if symptoms are not improving.  Acute Bronchitis Bronchitis is when the airways that extend from the windpipe into the lungs get red, puffy, and painful (inflamed). Bronchitis often causes thick spit (mucus) to develop. This leads to a cough. A cough is the most common symptom of bronchitis. In acute bronchitis, the condition usually begins suddenly and goes away over time (usually in 2 weeks). Smoking, allergies, and asthma can make bronchitis worse. Repeated episodes of bronchitis may cause more lung problems.  HOME CARE Rest. Drink enough  fluids to keep your pee (urine) clear or pale yellow (unless you need to limit fluids as told by your doctor). Only take over-the-counter or prescription medicines as told by your doctor. Avoid smoking and secondhand smoke. These can make bronchitis worse. If you are a smoker, think about using nicotine gum or skin patches. Quitting smoking will help your lungs heal faster. Reduce the chance of getting bronchitis again by: Washing your hands often. Avoiding people with cold symptoms. Trying not to touch your hands to your mouth, nose, or eyes. Follow up with your doctor as told.  GET HELP IF: Your symptoms do not improve after 1 week of treatment. Symptoms include: Cough. Fever. Coughing up thick spit. Body aches. Chest congestion.  Chills. Shortness of breath. Sore throat.  GET HELP RIGHT AWAY IF:  You have an increased fever. You have chills. You have severe shortness of breath. You have bloody thick spit (sputum). You throw up (vomit) often. You lose too much body fluid (dehydration). You have a severe headache. You faint.  MAKE SURE YOU:  Understand these instructions. Will watch your condition. Will get help right away if you are not doing well or get worse. Document Released: 11/19/2007 Document Revised: 02/02/2013 Document Reviewed: 11/23/2012 [facility] Patient Information 2015 [place], [place]. This information is not intended to replace advice given to you by your health care provider. Make sure you discuss any questions you have with your health care provider.    If you have been instructed to have an in-person evaluation today at a local Urgent Care facility, please use the link below. It will take you to a list of all of our available [facility], including address, phone number and hours of operation. Please do not delay care.  [facility]  If you or a family member do not have a primary care provider, use the link below to schedule a  visit and establish care. When you choose a [facility] primary care physician or advanced practice provider, you gain a long-term partner in health. Find a Primary Care Provider  Learn more about [facility]'s in-office and virtual care options:  - Get Care Now

## 2023-06-08 ENCOUNTER — Encounter: Payer: Self-pay | Admitting: Nurse Practitioner

## 2023-06-08 ENCOUNTER — Ambulatory Visit: Payer: BC Managed Care – PPO | Admitting: Nurse Practitioner

## 2023-06-08 VITALS — BP 132/77 | HR 60 | Temp 98.7°F | Resp 20 | Ht 70.0 in | Wt 225.0 lb

## 2023-06-08 DIAGNOSIS — E785 Hyperlipidemia, unspecified: Secondary | ICD-10-CM | POA: Diagnosis not present

## 2023-06-08 DIAGNOSIS — E119 Type 2 diabetes mellitus without complications: Secondary | ICD-10-CM | POA: Diagnosis not present

## 2023-06-08 DIAGNOSIS — F321 Major depressive disorder, single episode, moderate: Secondary | ICD-10-CM | POA: Diagnosis not present

## 2023-06-08 DIAGNOSIS — I10 Essential (primary) hypertension: Secondary | ICD-10-CM | POA: Diagnosis not present

## 2023-06-08 DIAGNOSIS — G2581 Restless legs syndrome: Secondary | ICD-10-CM

## 2023-06-08 DIAGNOSIS — K219 Gastro-esophageal reflux disease without esophagitis: Secondary | ICD-10-CM

## 2023-06-08 DIAGNOSIS — E1169 Type 2 diabetes mellitus with other specified complication: Secondary | ICD-10-CM | POA: Diagnosis not present

## 2023-06-08 DIAGNOSIS — Z7984 Long term (current) use of oral hypoglycemic drugs: Secondary | ICD-10-CM

## 2023-06-08 DIAGNOSIS — K1370 Unspecified lesions of oral mucosa: Secondary | ICD-10-CM

## 2023-06-08 LAB — LIPID PANEL

## 2023-06-08 LAB — BAYER DCA HB A1C WAIVED: HB A1C (BAYER DCA - WAIVED): 7.6 % — ABNORMAL HIGH (ref 4.8–5.6)

## 2023-06-08 MED ORDER — INSULIN GLARGINE-YFGN 100 UNIT/ML ~~LOC~~ SOLN: 34.0000 [IU] | Freq: Every day | SUBCUTANEOUS | 1 refills | Status: DC

## 2023-06-08 NOTE — Progress Notes (Signed)
Subjective:    Patient ID: Benjamin Hale, male    DOB: [birth year], 57 y.o.   MRN: [id number]   Chief Complaint: medical management of chronic issues     HPI:  Benjamin Hale is a 57 y.o. who identifies as a male who was assigned male at birth.   Social history: Lives with: wife and son Work history: [employer]   Comes in today for follow up of the following chronic medical issues:  1. Essential hypertension No c/o chest pain, sob or headache. Does not check blood pressure at home. BP Readings from Last 3 Encounters:  04/24/23 (!) 145/74  03/10/23 139/80  12/05/22 128/79     2. Diabetes mellitus treated with oral medication (HCC) Fasting blood sugars are running around above 200 all the time. He was switched form libre to dexcom and the dexcom has been reading high. Lab Results  Component Value Date   HGBA1C 7.7 (H) 03/10/2023     3. Hyperlipidemia associated with type 2 diabetes mellitus (HCC) He does not really wathc his diet and does no dedicated exercise. Lab Results  Component Value Date   CHOL 120 03/10/2023   HDL 20 (L) 03/10/2023   LDLCALC 56 03/10/2023   LDLDIRECT 62 12/05/2022   TRIG 276 (H) 03/10/2023   CHOLHDL 6.0 (H) 03/10/2023     4. Gastroesophageal reflux disease without esophagitis Is on omeprazole and is doing well.  5. Depression, major, single episode, moderate (HCC) Is on lexapro and has no issues.    04/24/2023    3:43 PM 03/10/2023    8:05 AM 12/05/2022    8:20 AM  Depression screen PHQ 2/9  Decreased Interest 0 0 0  Down, Depressed, Hopeless 0 0 0  PHQ - 2 Score 0 0 0  Altered sleeping 0 0 1  Tired, decreased energy 0 0 0  Change in appetite 0 0 0  Feeling bad or failure about yourself  0 0 0  Trouble concentrating 0 0 0  Moving slowly or fidgety/restless 0 0 0  Suicidal thoughts 0 0 0  PHQ-9 Score 0 0 1  Difficult doing work/chores Not difficult at all Not difficult at all Not difficult at all     6. Restless leg  syndrome Takes valium on as needed basis  7. BMI 31.0-31.9,adult No recent weight changes Wt Readings from Last 3 Encounters:  06/08/23 225 lb (102.1 kg)  04/24/23 228 lb (103.4 kg)  03/10/23 226 lb (102.5 kg)   BMI Readings from Last 3 Encounters:  06/08/23 32.28 kg/m  04/24/23 32.71 kg/m  03/10/23 32.43 kg/m         New complaints: None today  No Known Allergies Outpatient Encounter Medications as of 06/08/2023  Medication Sig   albuterol (VENTOLIN HFA) 108 (90 Base) MCG/ACT inhaler Inhale 2 puffs into the lungs every 6 (six) hours as needed for wheezing or shortness of breath.   amLODipine (NORVASC) 10 MG tablet Take 1 tablet (10 mg total) by mouth daily.   amoxicillin-clavulanate (AUGMENTIN) 875-125 MG tablet Take 1 tablet by mouth 2 (two) times daily.   aspirin EC 81 MG tablet Take 81 mg by mouth daily.   Continuous Glucose Receiver (DEXCOM G7 RECEIVER) DEVI 1 each by Does not apply route daily.   Continuous Glucose Sensor (DEXCOM G7 SENSOR) MISC Change sensory every 10 days   diazepam (VALIUM) 5 MG tablet TAKE 1 TABLET EVERY 12 HOURS AS NEEDED FOR ANXIETY   escitalopram (LEXAPRO) 10 MG tablet  TAKE 1 TABLET DAILY   fenofibrate (TRICOR) 145 MG tablet Take 1 tablet (145 mg total) by mouth daily.   fluticasone (FLONASE) 50 MCG/ACT nasal spray USE 2 SPRAYS IN EACH NOSTRIL DAILY AS NEEDED FOR RHINITIS OR ALLERGIES   glimepiride (AMARYL) 4 MG tablet Take 1 tablet (4 mg total) by mouth daily before breakfast.   guaiFENesin (MUCINEX) 600 MG 12 hr tablet Take 1 tablet (600 mg total) by mouth 2 (two) times daily.   ibuprofen (ADVIL,MOTRIN) 200 MG tablet Take 600 mg by mouth every 6 (six) hours as needed (For headache or pain.).   insulin glargine-yfgn (SEMGLEE, YFGN,) 100 UNIT/ML injection Inject 0.26 mLs (26 Units total) into the skin daily.   Insulin Pen Needle (BD PEN NEEDLE MICRO U/F) 32G X 6 MM MISC USE TO GIVE SEMGLEE AS NEEDED   lisinopril (ZESTRIL) 40 MG tablet Take 1  tablet (40 mg total) by mouth daily.   metFORMIN (GLUCOPHAGE) 1000 MG tablet Take 1 tablet (1,000 mg total) by mouth 2 (two) times daily with a meal.   metoprolol tartrate (LOPRESSOR) 25 MG tablet Take 1 tablet (25 mg total) by mouth 2 (two) times daily.   Multiple Vitamin (MULTIVITAMIN WITH MINERALS) TABS tablet Take 1 tablet by mouth daily.   nitroGLYCERIN (NITROSTAT) 0.4 MG SL tablet Place 1 tablet (0.4 mg total) under the tongue every 5 (five) minutes as needed for chest pain.   omega-3 acid ethyl esters (LOVAZA) 1 g capsule Take 1 capsule (1 g total) by mouth 2 (two) times daily.   omeprazole (PRILOSEC) 20 MG capsule TAKE 1 CAPSULE DAILY AS NEEDED FOR ACID REFLUX   Probiotic Product (PROBIOTIC PO) Take 1 capsule by mouth daily.   promethazine-dextromethorphan (PROMETHAZINE-DM) 6.25-15 MG/5ML syrup Take 5 mLs by mouth 4 (four) times daily as needed for cough.   No facility-administered encounter medications on file as of 06/08/2023.    Past Surgical History:  Procedure Laterality Date   ANTERIOR CERVICAL DECOMP/DISCECTOMY FUSION N/A 09/22/2012   Procedure: ANTERIOR CERVICAL DECOMPRESSION/DISCECTOMY FUSION 2 LEVELS;  Surgeon: Karn Cassis, MD;  Location: [facility];  Service: Neurosurgery;  Laterality: N/A;  Cervical five-six Cervical six-seven Anterior cervical decompression/diskectomy/fusion   ATRIAL FLUTTER ABLATION  12/23/2012   CTI ablation by Dr Johney Frame   ATRIAL FLUTTER ABLATION N/A 12/23/2012   Procedure: ATRIAL FLUTTER ABLATION;  Surgeon: Hillis Range, MD;  Location[facility] CATH LAB;  Service: Cardiovascular;  Laterality: N/A;   CARDIAC CATHETERIZATION N/A 11/16/2015   Procedure: Left Heart Cath and Coronary Angiography;  Surgeon: Marykay Lex, MD;  Location: [facility] INVASIVE CV LAB;  Service: Cardiovascular;  Laterality: N/A;   CARDIOVERSION N/A 09/23/2012   Procedure: CARDIOVERSION;  Surgeon: Gaylord Shih, MD;  Location: [facility] OR;  Service: Cardiovascular;  Laterality: N/A;   POSTERIOR  CERVICAL FUSION/FORAMINOTOMY N/A 07/19/2021   Procedure: Cervical five- six Cervical six-seven Lateral Mass Fusion;  Surgeon: Lisbeth Renshaw, MD;  Location: [facility] OR;  Service: Neurosurgery;  Laterality: N/A;   TONSILLECTOMY  1973   TREATMENT FISTULA ANAL  ~ 2007   WISDOM TOOTH EXTRACTION      Family History  Problem Relation Age of Onset   Lung cancer Mother        died @ 52   Brain cancer Father        died @ 50   Diabetes Maternal Uncle    Diabetes Maternal Grandfather       Controlled substance contract: n/a     Review of Systems  Constitutional:  Negative for diaphoresis.  Eyes:  Negative for pain.  Respiratory:  Negative for shortness of breath.   Cardiovascular:  Negative for chest pain, palpitations and leg swelling.  Gastrointestinal:  Negative for abdominal pain.  Endocrine: Negative for polydipsia.  Skin:  Negative for rash.  Neurological:  Negative for dizziness, weakness and headaches.  Hematological:  Does not bruise/bleed easily.  All other systems reviewed and are negative.      Objective:   Physical Exam Vitals and nursing note reviewed.  Constitutional:      Appearance: Normal appearance. He is well-developed.  HENT:     Head: Normocephalic.     Nose: Nose normal.     Mouth/Throat:     Mouth: Mucous membranes are moist.     Pharynx: Oropharynx is clear.     Comments: Erythematous pumps on uvula Eyes:     Pupils: Pupils are equal, round, and reactive to light.  Neck:     Thyroid: No thyroid mass or thyromegaly.     Vascular: No carotid bruit or JVD.     Trachea: Phonation normal.  Cardiovascular:     Rate and Rhythm: Normal rate and regular rhythm.  Pulmonary:     Effort: Pulmonary effort is normal. No respiratory distress.     Breath sounds: Normal breath sounds.  Abdominal:     General: Bowel sounds are normal.     Palpations: Abdomen is soft.     Tenderness: There is no abdominal tenderness.  Musculoskeletal:        General: Normal  range of motion.     Cervical back: Normal range of motion and neck supple.  Lymphadenopathy:     Cervical: No cervical adenopathy.  Skin:    General: Skin is warm and dry.  Neurological:     Mental Status: He is alert and oriented to person, place, and time.  Psychiatric:        Behavior: Behavior normal.        Thought Content: Thought content normal.        Judgment: Judgment normal.    There were no vitals taken for this visit. % Hgba1c 7.6       Assessment & Plan:  Benjamin Hale comes in today with chief complaint of No chief complaint on file.   Diagnosis and orders addressed:  1. Essential hypertension Low sodium diet - CBC with Differential/Platelet - CMP14+EGFR - amLODipine (NORVASC) 10 MG tablet; Take 1 tablet (10 mg total) by mouth daily.  Dispense: 90 tablet; Refill: 1 - lisinopril (ZESTRIL) 40 MG tablet; Take 1 tablet (40 mg total) by mouth daily.  Dispense: 90 tablet; Refill: 1 - metoprolol tartrate (LOPRESSOR) 25 MG tablet; Take 1 tablet (25 mg total) by mouth 2 (two) times daily.  Dispense: 180 tablet; Refill: 1  2. Diabetes mellitus treated with oral medication (HCC) Stricter carb counting Increase semglee to 30u for 3 days then 34u daily Compare dexcom readings to finger sticks - Bayer DCA Hb A1c Waived - glimepiride (AMARYL) 4 MG tablet; Take 1 tablet (4 mg total) by mouth daily before breakfast.  Dispense: 90 tablet; Refill: 1 - insulin glargine-yfgn (SEMGLEE, YFGN,) 100 UNIT/ML injection; Inject 0.34 mLs (34 Units total) into the skin daily.  Dispense: 30 mL; Refill: 1 - metFORMIN (GLUCOPHAGE) 1000 MG tablet; Take 1 tablet (1,000 mg total) by mouth 2 (two) times daily with a meal.  Dispense: 180 tablet; Refill: 1  3. Hyperlipidemia associated with type 2 diabetes mellitus (HCC)  Low fat diet - Lipid panel - fenofibrate (TRICOR) 145 MG tablet; Take 1 tablet (145 mg total) by mouth daily.  Dispense: 90 tablet; Refill: 1  4. Gastroesophageal  reflux disease without esophagitis Avoid spicy foods Do not eat 2 hours prior to bedtime - omeprazole (PRILOSEC) 20 MG capsule; TAKE 1 CAPSULE DAILY AS NEEDED FOR ACID REFLUX  Dispense: 90 capsule; Refill: 1  5. Depression, major, single episode, moderate (HCC) Stress management - escitalopram (LEXAPRO) 10 MG tablet; TAKE 1 TABLET DAILY  Dispense: 90 tablet; Refill: 1  6. Restless leg syndrome  7. BMI 31.0-31.9,adult Discussed diet and exercise for person with BMI >25 Will recheck weight in 3-6 months   8. GAD (generalized anxiety disorder) Stress management - diazepam (VALIUM) 5 MG tablet; TAKE 1 TABLET EVERY 12 HOURS AS NEEDED FOR ANXIETY  Dispense: 90 tablet; Refill: 1  9. Colon cancer screening - Cologuard   Labs pending Health Maintenance reviewed Diet and exercise encouraged  Follow up plan: 3 months   Mary-Margaret Daphine Deutscher, [name]

## 2023-06-09 LAB — CMP14+EGFR
ALT: 36 IU/L (ref 0–44)
AST: 33 IU/L (ref 0–40)
Albumin: 4.4 g/dL (ref 3.8–4.9)
Alkaline Phosphatase: 70 IU/L (ref 44–121)
BUN/Creatinine Ratio: 16 (ref 9–20)
BUN: 14 mg/dL (ref 6–24)
Bilirubin Total: 0.8 mg/dL (ref 0.0–1.2)
CO2: 20 mmol/L (ref 20–29)
Calcium: 9.1 mg/dL (ref 8.7–10.2)
Chloride: 100 mmol/L (ref 96–106)
Creatinine, Ser: 0.87 mg/dL (ref 0.76–1.27)
Globulin, Total: 2.3 g/dL (ref 1.5–4.5)
Glucose: 229 mg/dL — ABNORMAL HIGH (ref 70–99)
Potassium: 4.2 mmol/L (ref 3.5–5.2)
Sodium: 138 mmol/L (ref 134–144)
Total Protein: 6.7 g/dL (ref 6.0–8.5)
eGFR: 101 mL/min/{1.73_m2} (ref >59)

## 2023-06-09 LAB — CBC WITH DIFFERENTIAL/PLATELET
Basophils Absolute: 0.1 10*3/uL (ref 0.0–0.2)
Basos: 1 %
EOS (ABSOLUTE): 0.2 10*3/uL (ref 0.0–0.4)
Eos: 2 %
Hematocrit: 45.8 % (ref 37.5–51.0)
Hemoglobin: 15.2 g/dL (ref 13.0–17.7)
Immature Grans (Abs): 0 10*3/uL (ref 0.0–0.1)
Immature Granulocytes: 0 %
Lymphocytes Absolute: 2.1 10*3/uL (ref 0.7–3.1)
Lymphs: 32 %
MCH: 28.5 pg (ref 26.6–33.0)
MCHC: 33.2 g/dL (ref 31.5–35.7)
MCV: 86 fL (ref 79–97)
Monocytes Absolute: 0.6 10*3/uL (ref 0.1–0.9)
Monocytes: 10 %
Neutrophils Absolute: 3.6 10*3/uL (ref 1.4–7.0)
Neutrophils: 55 %
Platelets: 242 10*3/uL (ref 150–450)
RBC: 5.34 x10E6/uL (ref 4.14–5.80)
RDW: 13.1 % (ref 11.6–15.4)
WBC: 6.6 10*3/uL (ref 3.4–10.8)

## 2023-06-09 LAB — LIPID PANEL
Cholesterol, Total: 122 mg/dL (ref 100–199)
HDL: 18 mg/dL — ABNORMAL LOW (ref >39)
LDL CALC COMMENT:: 6.8 ratio — ABNORMAL HIGH (ref 0.0–5.0)
LDL Chol Calc (NIH): 60 mg/dL (ref 0–99)
Triglycerides: 277 mg/dL — ABNORMAL HIGH (ref 0–149)
VLDL Cholesterol Cal: 44 mg/dL — ABNORMAL HIGH (ref 5–40)

## 2023-06-30 ENCOUNTER — Other Ambulatory Visit: Payer: Self-pay | Admitting: Nurse Practitioner

## 2023-06-30 DIAGNOSIS — E119 Type 2 diabetes mellitus without complications: Secondary | ICD-10-CM

## 2023-06-30 MED ORDER — INSULIN GLARGINE-YFGN 100 UNIT/ML ~~LOC~~ SOLN: 34.0000 [IU] | Freq: Every day | SUBCUTANEOUS | 1 refills | Status: DC

## 2023-06-30 NOTE — Progress Notes (Signed)
 Meds ordered this encounter  Medications   insulin  glargine-yfgn (SEMGLEE , YFGN,) 100 UNIT/ML injection    Sig: Inject 0.34 mLs (34 Units total) into the skin daily.    Dispense:  30 mL    Refill:  1    Supervising Provider:   MARYANNE CHEW A [[id number]]   Mary-Margaret Gladis, [name]

## 2023-08-08 ENCOUNTER — Other Ambulatory Visit: Payer: Self-pay | Admitting: Nurse Practitioner

## 2023-08-27 ENCOUNTER — Ambulatory Visit: Admitting: Nurse Practitioner

## 2023-08-27 ENCOUNTER — Encounter: Payer: Self-pay | Admitting: Nurse Practitioner

## 2023-08-27 VITALS — BP 136/82 | HR 57 | Temp 98.2°F | Ht 70.0 in | Wt 225.0 lb

## 2023-08-27 DIAGNOSIS — F321 Major depressive disorder, single episode, moderate: Secondary | ICD-10-CM

## 2023-08-27 DIAGNOSIS — I10 Essential (primary) hypertension: Secondary | ICD-10-CM

## 2023-08-27 DIAGNOSIS — E1169 Type 2 diabetes mellitus with other specified complication: Secondary | ICD-10-CM

## 2023-08-27 DIAGNOSIS — G2581 Restless legs syndrome: Secondary | ICD-10-CM

## 2023-08-27 DIAGNOSIS — R079 Chest pain, unspecified: Secondary | ICD-10-CM

## 2023-08-27 DIAGNOSIS — E785 Hyperlipidemia, unspecified: Secondary | ICD-10-CM

## 2023-08-27 DIAGNOSIS — E119 Type 2 diabetes mellitus without complications: Secondary | ICD-10-CM

## 2023-08-27 DIAGNOSIS — K219 Gastro-esophageal reflux disease without esophagitis: Secondary | ICD-10-CM

## 2023-08-27 DIAGNOSIS — Z6831 Body mass index (BMI) 31.0-31.9, adult: Secondary | ICD-10-CM

## 2023-08-27 DIAGNOSIS — F411 Generalized anxiety disorder: Secondary | ICD-10-CM | POA: Diagnosis not present

## 2023-08-27 DIAGNOSIS — Z7984 Long term (current) use of oral hypoglycemic drugs: Secondary | ICD-10-CM

## 2023-08-27 LAB — LIPID PANEL

## 2023-08-27 LAB — BAYER DCA HB A1C WAIVED: HB A1C (BAYER DCA - WAIVED): 8.4 % — ABNORMAL HIGH (ref 4.8–5.6)

## 2023-08-27 MED ORDER — INSULIN GLARGINE-YFGN 100 UNIT/ML ~~LOC~~ SOLN: 40.0000 [IU] | Freq: Every day | SUBCUTANEOUS | 1 refills | Status: DC

## 2023-08-27 MED ORDER — LISINOPRIL 40 MG PO TABS: 40.0000 mg | ORAL_TABLET | Freq: Every day | ORAL | 1 refills | Status: DC

## 2023-08-27 MED ORDER — METFORMIN HCL 1000 MG PO TABS: 1000.0000 mg | ORAL_TABLET | Freq: Two times a day (BID) | ORAL | 1 refills | Status: DC

## 2023-08-27 MED ORDER — DIAZEPAM 5 MG PO TABS: ORAL_TABLET | ORAL | 1 refills | Status: AC

## 2023-08-27 MED ORDER — AMLODIPINE BESYLATE 10 MG PO TABS: 10.0000 mg | ORAL_TABLET | Freq: Every day | ORAL | 1 refills | Status: DC

## 2023-08-27 MED ORDER — FENOFIBRATE 145 MG PO TABS
145.0000 mg | ORAL_TABLET | Freq: Every day | ORAL | 1 refills | Status: DC
Start: 2023-08-27 — End: 2023-09-01

## 2023-08-27 MED ORDER — GLIMEPIRIDE 4 MG PO TABS: 4.0000 mg | ORAL_TABLET | Freq: Every day | ORAL | 1 refills | Status: DC

## 2023-08-27 MED ORDER — ESCITALOPRAM OXALATE 10 MG PO TABS: ORAL_TABLET | ORAL | 1 refills | Status: DC

## 2023-08-27 MED ORDER — OMEPRAZOLE 20 MG PO CPDR: DELAYED_RELEASE_CAPSULE | ORAL | 1 refills | Status: DC

## 2023-08-27 MED ORDER — OMEGA-3-ACID ETHYL ESTERS 1 G PO CAPS: 1.0000 | ORAL_CAPSULE | Freq: Two times a day (BID) | ORAL | 3 refills | Status: DC

## 2023-08-27 MED ORDER — METOPROLOL TARTRATE 25 MG PO TABS: 25.0000 mg | ORAL_TABLET | Freq: Two times a day (BID) | ORAL | 1 refills | Status: DC

## 2023-08-27 NOTE — Patient Instructions (Signed)
 Chest Pain (Angina): What to Know Angina is pain or discomfort in the chest. It can also be felt in the neck, arm, jaw, or back. Angina is caused by not having enough blood flow to the heart wall. Angina may be a warning that you're at risk for having a heart attack. What are the causes? Angina is most often caused by build-up of plaque in your arteries that makes it hard for blood to flow. Plaque narrows and blocks the arteries of the heart. Plaque is made of fats and cholesterol. Angina is also caused by: Sudden spasms of the muscles in the arteries of the heart. Small artery disease. Heart valve problems. A tear in an artery of your heart. Weakness of the heart muscle. What increases the risk? Main risks Having high cholesterol. High blood pressure. Having diabetes. Family history of heart disease. Not exercising or moving enough. Having had radiation treatment to the left side of your chest. Other risks Using tobacco products. Being very overweight. Eating foods that have a lot of unhealthy fats. Feeling stressed or having depression. Using drugs, such as cocaine. What are the signs or symptoms? Symptoms in all people Chest pain, which may: Feel like a crushing or squeezing in the chest. Feel like a tightness, pressure, or heaviness in the chest. Last for more than a few minutes at a time. Stop and come back. Pain in the neck, arm, jaw, or back. Heartburn or upset stomach for no reason. Being short of breath. Feeling like you may throw up. Sudden cold sweats. Other symptoms in females Tiredness or weakness. Worry and anxiety. Dizziness or fainting. How is this diagnosed?  Your symptoms and medical history. Blood tests. Electrocardiogram (ECG) to measure the electrical activity of your heart. Stress test to look for signs of a blocked artery. CT angiogram to examine your heart and the blood flow to it. Coronary angiogram to check for a blocked artery. How is this  treated? Medicines to: Prevent blood clots. Relax blood vessels and improve blood flow to the heart. Lower blood pressure. Reduce cholesterol. You may have a procedure called angioplasty to widen a narrowed or blocked artery. A small mesh tube called a stent may be put in the artery to keep it open. Surgery may be needed to allow blood to go around a blocked artery. Follow these instructions at home: Medicines Take your medicines only as told. Do not take these medicines unless your provider says that you can: NSAIDs, such as ibuprofen and naproxen. Supplements that contain vitamin A, vitamin E, or both. Hormone therapy that contains estrogen with or without progestin. Eating and drinking  Eat a healthy diet that includes: Lots of fresh fruits and vegetables. Whole grains. Low-fat protein. Low-fat dairy products. Follow instructions about what you may eat and drink. Activity Exercise as told. Talk with your provider about doing a program called cardiac rehab to help make your heart strong. When you feel tired, take a break. Plan breaks if you know you're going to feel tired. Lifestyle Do not smoke, vape, or use nicotine or tobacco. If your provider says you can drink alcohol: Limit how much you have to: 0-1 drink a day if you're male and not pregnant. 0-2 drinks a day if you're male. Know how much alcohol is in your drink. In the [place] one drink is one 12 oz bottle of beer (355 mL), one 5 oz glass of wine (148 mL), or one 1 oz glass of hard liquor (44 mL). General instructions  Stay at a healthy weight. If told to lose weight, work with your provider to lose weight safely. Keep your vaccines up to date. Get a flu shot every year. Learn to manage stress. If you need help, ask your provider. Talk with your provider if you feel depressed. Work with your provider to manage any other health problems that you have. These may include diabetes or high blood pressure. Keep all  follow-up visits. Your provider will want to check on your condition. Get help right away if: You have pain in your chest, neck, arm, jaw, or back, and the pain: Happens more often. Lasts more than a few minutes. Goes away and comes back. Does not get better after you take medicine under your tongue. You're dizzy or light-headed all of a sudden. You faint. You have any combination of these problems: Cold sweats. Heartburn or upset stomach. Trouble breathing. Feeling like you may throw up, or you throw up. Feeling very tired or weak. Feeling worried or nervous. These symptoms may be an emergency. Call 911 right away. Do not wait to see if the symptoms will go away. Do not drive yourself to the hospital. This information is not intended to replace advice given to you by your health care provider. Make sure you discuss any questions you have with your health care provider. Document Revised: 04/14/2023 Document Reviewed: 10/26/2022 Elsevier Patient Education  2024 [employer].

## 2023-08-27 NOTE — Progress Notes (Signed)
 Subjective:    Patient ID: Benjamin Hale, male    DOB: 1966-02-24, 58 y.o.   MRN: [id number]   Chief Complaint: medical management of chronic issues     HPI:  Benjamin Hale is a 58 y.o. who identifies as a male who was assigned male at birth.   Social history: Lives with: wife and son Work history: [employer]   Comes in today for follow up of the following chronic medical issues:  1. Essential hypertension No c/o chest pain, sob or headache. Does not check blood pressure at home. BP Readings from Last 3 Encounters:  06/08/23 132/77  04/24/23 (!) 145/74  03/10/23 139/80     2. Diabetes mellitus treated with oral medication (HCC) Fasting blood sugars are running around above 200 all the time. He was switched form libre to dexcom and the dexcom has been reading high. He has been running really high the last 3 weeks. His diet has not changed. Lab Results  Component Value Date   HGBA1C 7.6 (H) 06/08/2023     3. Hyperlipidemia associated with type 2 diabetes mellitus (HCC) He does not really wathc his diet and does no dedicated exercise. Lab Results  Component Value Date   CHOL 122 06/08/2023   HDL 18 (L) 06/08/2023   LDLCALC 60 06/08/2023   LDLDIRECT 62 12/05/2022   TRIG 277 (H) 06/08/2023   CHOLHDL 6.8 (H) 06/08/2023     4. Gastroesophageal reflux disease without esophagitis Is on omeprazole and is doing well.  5. Depression, major, single episode, moderate (HCC) Is on lexapro and has no issues.    06/08/2023    8:24 AM 04/24/2023    3:43 PM 03/10/2023    8:05 AM  Depression screen PHQ 2/9  Decreased Interest 0 0 0  Down, Depressed, Hopeless 0 0 0  PHQ - 2 Score 0 0 0  Altered sleeping  0 0  Tired, decreased energy  0 0  Change in appetite  0 0  Feeling bad or failure about yourself   0 0  Trouble concentrating  0 0  Moving slowly or fidgety/restless  0 0  Suicidal thoughts  0 0  PHQ-9 Score  0 0  Difficult doing work/chores  Not difficult at all  Not difficult at all     6. Restless leg syndrome Takes valium on as needed basis  7. BMI 31.0-31.9,adult No recent weight changes  Wt Readings from Last 3 Encounters:  08/27/23 225 lb (102.1 kg)  06/08/23 225 lb (102.1 kg)  04/24/23 228 lb (103.4 kg)   BMI Readings from Last 3 Encounters:  08/27/23 32.28 kg/m  06/08/23 32.28 kg/m  04/24/23 32.71 kg/m          New complaints: Patient had episode of chest pain this morning. Lasted a couple of minutes. Describes pain as sharp. No SOB.  No Known Allergies Outpatient Encounter Medications as of 08/27/2023  Medication Sig   albuterol (VENTOLIN HFA) 108 (90 Base) MCG/ACT inhaler Inhale 2 puffs into the lungs every 6 (six) hours as needed for wheezing or shortness of breath.   amLODipine (NORVASC) 10 MG tablet Take 1 tablet (10 mg total) by mouth daily.   aspirin EC 81 MG tablet Take 81 mg by mouth daily.   Continuous Glucose Receiver (DEXCOM G7 RECEIVER) DEVI 1 each by Does not apply route daily.   Continuous Glucose Sensor (DEXCOM G7 SENSOR) MISC CHANGE SENSOR EVERY 10 DAYS   diazepam (VALIUM) 5 MG tablet TAKE 1  TABLET EVERY 12 HOURS AS NEEDED FOR ANXIETY   escitalopram (LEXAPRO) 10 MG tablet TAKE 1 TABLET DAILY   fenofibrate (TRICOR) 145 MG tablet Take 1 tablet (145 mg total) by mouth daily.   fluticasone (FLONASE) 50 MCG/ACT nasal spray USE 2 SPRAYS IN EACH NOSTRIL DAILY AS NEEDED FOR RHINITIS OR ALLERGIES   glimepiride (AMARYL) 4 MG tablet Take 1 tablet (4 mg total) by mouth daily before breakfast.   ibuprofen (ADVIL,MOTRIN) 200 MG tablet Take 600 mg by mouth every 6 (six) hours as needed (For headache or pain.).   insulin glargine-yfgn (SEMGLEE, YFGN,) 100 UNIT/ML injection Inject 0.34 mLs (34 Units total) into the skin daily.   Insulin Pen Needle (BD PEN NEEDLE MICRO U/F) 32G X 6 MM MISC USE TO GIVE SEMGLEE AS NEEDED   lisinopril (ZESTRIL) 40 MG tablet Take 1 tablet (40 mg total) by mouth daily.   metFORMIN  (GLUCOPHAGE) 1000 MG tablet Take 1 tablet (1,000 mg total) by mouth 2 (two) times daily with a meal.   metoprolol tartrate (LOPRESSOR) 25 MG tablet Take 1 tablet (25 mg total) by mouth 2 (two) times daily.   Multiple Vitamin (MULTIVITAMIN WITH MINERALS) TABS tablet Take 1 tablet by mouth daily.   nitroGLYCERIN (NITROSTAT) 0.4 MG SL tablet Place 1 tablet (0.4 mg total) under the tongue every 5 (five) minutes as needed for chest pain.   omega-3 acid ethyl esters (LOVAZA) 1 g capsule Take 1 capsule (1 g total) by mouth 2 (two) times daily.   omeprazole (PRILOSEC) 20 MG capsule TAKE 1 CAPSULE DAILY AS NEEDED FOR ACID REFLUX   Probiotic Product (PROBIOTIC PO) Take 1 capsule by mouth daily.   No facility-administered encounter medications on file as of 08/27/2023.    Past Surgical History:  Procedure Laterality Date   ANTERIOR CERVICAL DECOMP/DISCECTOMY FUSION N/A 09/22/2012   Procedure: ANTERIOR CERVICAL DECOMPRESSION/DISCECTOMY FUSION 2 LEVELS;  Surgeon: Karn Cassis, MD;  Location: [facility];  Service: Neurosurgery;  Laterality: N/A;  Cervical five-six Cervical six-seven Anterior cervical decompression/diskectomy/fusion   ATRIAL FLUTTER ABLATION  12/23/2012   CTI ablation by Dr Johney Frame   ATRIAL FLUTTER ABLATION N/A 12/23/2012   Procedure: ATRIAL FLUTTER ABLATION;  Surgeon: Hillis Range, MD;  Location: [facility] CATH LAB;  Service: Cardiovascular;  Laterality: N/A;   CARDIAC CATHETERIZATION N/A 11/16/2015   Procedure: Left Heart Cath and Coronary Angiography;  Surgeon: Marykay Lex, MD;  Location: [facility] INVASIVE CV LAB;  Service: Cardiovascular;  Laterality: N/A;   CARDIOVERSION N/A 09/23/2012   Procedure: CARDIOVERSION;  Surgeon: Gaylord Shih, MD;  Location: [facility] OR;  Service: Cardiovascular;  Laterality: N/A;   POSTERIOR CERVICAL FUSION/FORAMINOTOMY N/A 07/19/2021   Procedure: Cervical five- six Cervical six-seven Lateral Mass Fusion;  Surgeon: Lisbeth Renshaw, MD;  Location: [facility] OR;  Service:  Neurosurgery;  Laterality: N/A;   TONSILLECTOMY  1973   TREATMENT FISTULA ANAL  ~ 2007   WISDOM TOOTH EXTRACTION      Family History  Problem Relation Age of Onset   Lung cancer Mother        died @ 78   Brain cancer Father        died @ 59   Diabetes Maternal Uncle    Diabetes Maternal Grandfather       Controlled substance contract: n/a     Review of Systems  Constitutional:  Negative for diaphoresis.  Eyes:  Negative for pain.  Respiratory:  Negative for shortness of breath.   Cardiovascular:  Negative for chest  pain, palpitations and leg swelling.  Gastrointestinal:  Negative for abdominal pain.  Endocrine: Negative for polydipsia.  Skin:  Negative for rash.  Neurological:  Negative for dizziness, weakness and headaches.  Hematological:  Does not bruise/bleed easily.  All other systems reviewed and are negative.      Objective:   Physical Exam Vitals and nursing note reviewed.  Constitutional:      Appearance: Normal appearance. He is well-developed.  HENT:     Head: Normocephalic.     Nose: Nose normal.     Mouth/Throat:     Mouth: Mucous membranes are moist.     Pharynx: Oropharynx is clear.     Comments: Erythematous pumps on uvula Eyes:     Pupils: Pupils are equal, round, and reactive to light.  Neck:     Thyroid: No thyroid mass or thyromegaly.     Vascular: No carotid bruit or JVD.     Trachea: Phonation normal.  Cardiovascular:     Rate and Rhythm: Normal rate and regular rhythm.  Pulmonary:     Effort: Pulmonary effort is normal. No respiratory distress.     Breath sounds: Normal breath sounds.  Abdominal:     General: Bowel sounds are normal.     Palpations: Abdomen is soft.     Tenderness: There is no abdominal tenderness.  Musculoskeletal:        General: Normal range of motion.     Cervical back: Normal range of motion and neck supple.  Lymphadenopathy:     Cervical: No cervical adenopathy.  Skin:    General: Skin is warm and dry.   Neurological:     Mental Status: He is alert and oriented to person, place, and time.  Psychiatric:        Behavior: Behavior normal.        Thought Content: Thought content normal.        Judgment: Judgment normal.    BP 136/82   Pulse (!) 57   Temp 98.2 F (36.8 C) (Temporal)   Ht 5\' 10"  (1.778 m)   Wt 225 lb (102.1 kg)   SpO2 94%   BMI 32.28 kg/m   EKG- NSR--Mary-Margaret Daphine Deutscher, [name]  Hgba1c 8.4%       Assessment & Plan:  FADI MENTER comes in today with chief complaint of medical management of chronic issues    Diagnosis and orders addressed:  1. Essential hypertension Low sodium diet - CBC with Differential/Platelet - CMP14+EGFR - amLODipine (NORVASC) 10 MG tablet; Take 1 tablet (10 mg total) by mouth daily.  Dispense: 90 tablet; Refill: 1 - lisinopril (ZESTRIL) 40 MG tablet; Take 1 tablet (40 mg total) by mouth daily.  Dispense: 90 tablet; Refill: 1 - metoprolol tartrate (LOPRESSOR) 25 MG tablet; Take 1 tablet (25 mg total) by mouth 2 (two) times daily.  Dispense: 180 tablet; Refill: 1  2. Diabetes mellitus treated with oral medication (HCC) Stricter carb counting Increase semglee to 40u  Compare dexcom readings to finger sticks - Bayer DCA Hb A1c Waived - glimepiride (AMARYL) 4 MG tablet; Take 1 tablet (4 mg total) by mouth daily before breakfast.  Dispense: 90 tablet; Refill: 1 - insulin glargine-yfgn (SEMGLEE, YFGN,) 100 UNIT/ML injection; Inject 0.34 mLs (34 Units total) into the skin daily.  Dispense: 30 mL; Refill: 1 - metFORMIN (GLUCOPHAGE) 1000 MG tablet; Take 1 tablet (1,000 mg total) by mouth 2 (two) times daily with a meal.  Dispense: 180 tablet; Refill: 1  3. Hyperlipidemia associated  with type 2 diabetes mellitus (HCC) Low fat diet - Lipid panel - fenofibrate (TRICOR) 145 MG tablet; Take 1 tablet (145 mg total) by mouth daily.  Dispense: 90 tablet; Refill: 1  4. Gastroesophageal reflux disease without esophagitis Avoid spicy foods Do  not eat 2 hours prior to bedtime - omeprazole (PRILOSEC) 20 MG capsule; TAKE 1 CAPSULE DAILY AS NEEDED FOR ACID REFLUX  Dispense: 90 capsule; Refill: 1  5. Depression, major, single episode, moderate (HCC) Stress management - escitalopram (LEXAPRO) 10 MG tablet; TAKE 1 TABLET DAILY  Dispense: 90 tablet; Refill: 1  6. Restless leg syndrome  7. BMI 31.0-31.9,adult Discussed diet and exercise for person with BMI >25 Will recheck weight in 3-6 months   8. GAD (generalized anxiety disorder) Stress management - diazepam (VALIUM) 5 MG tablet; TAKE 1 TABLET EVERY 12 HOURS AS NEEDED FOR ANXIETY  Dispense: 90 tablet; Refill: 1  9. Colon cancer screening - Cologuard  10. Chest pain Needs to make appt to follow up with cardiology  Labs pending Health Maintenance reviewed Diet and exercise encouraged  Follow up plan: 3 months   Mary-Margaret Daphine Deutscher, [name]

## 2023-08-28 ENCOUNTER — Ambulatory Visit: Payer: BC Managed Care – PPO | Admitting: Nurse Practitioner

## 2023-08-28 LAB — CBC WITH DIFFERENTIAL/PLATELET
Basophils Absolute: 0.1 10*3/uL (ref 0.0–0.2)
Basos: 1 %
EOS (ABSOLUTE): 0.2 10*3/uL (ref 0.0–0.4)
Eos: 3 %
Hematocrit: 44.7 % (ref 37.5–51.0)
Hemoglobin: 15.1 g/dL (ref 13.0–17.7)
Immature Grans (Abs): 0 10*3/uL (ref 0.0–0.1)
Immature Granulocytes: 1 %
Lymphocytes Absolute: 2.1 10*3/uL (ref 0.7–3.1)
Lymphs: 30 %
MCH: 28.8 pg (ref 26.6–33.0)
MCHC: 33.8 g/dL (ref 31.5–35.7)
MCV: 85 fL (ref 79–97)
Monocytes Absolute: 0.7 10*3/uL (ref 0.1–0.9)
Monocytes: 10 %
Neutrophils Absolute: 4 10*3/uL (ref 1.4–7.0)
Neutrophils: 55 %
Platelets: 248 10*3/uL (ref 150–450)
RBC: 5.24 x10E6/uL (ref 4.14–5.80)
RDW: 13.1 % (ref 11.6–15.4)
WBC: 7.1 10*3/uL (ref 3.4–10.8)

## 2023-08-28 LAB — MICROALBUMIN / CREATININE URINE RATIO
Creatinine, Urine: 77.9 mg/dL
Microalb/Creat Ratio: 9 mg/g{creat} (ref 0–29)
Microalbumin, Urine: 6.7 ug/mL

## 2023-08-28 LAB — CMP14+EGFR
ALT: 28 IU/L (ref 0–44)
AST: 27 IU/L (ref 0–40)
Albumin: 4.3 g/dL (ref 3.8–4.9)
Alkaline Phosphatase: 79 IU/L (ref 44–121)
BUN/Creatinine Ratio: 18 (ref 9–20)
BUN: 17 mg/dL (ref 6–24)
Bilirubin Total: 0.6 mg/dL (ref 0.0–1.2)
CO2: 20 mmol/L (ref 20–29)
Calcium: 9.1 mg/dL (ref 8.7–10.2)
Chloride: 99 mmol/L (ref 96–106)
Creatinine, Ser: 0.94 mg/dL (ref 0.76–1.27)
Globulin, Total: 2.4 g/dL (ref 1.5–4.5)
Glucose: 311 mg/dL — ABNORMAL HIGH (ref 70–99)
Potassium: 4.2 mmol/L (ref 3.5–5.2)
Sodium: 137 mmol/L (ref 134–144)
Total Protein: 6.7 g/dL (ref 6.0–8.5)
eGFR: 95 mL/min/{1.73_m2} (ref >59)

## 2023-08-28 LAB — LIPID PANEL
Cholesterol, Total: 114 mg/dL (ref 100–199)
HDL: 14 mg/dL — ABNORMAL LOW (ref >39)
LDL CALC COMMENT:: 8.1 ratio — ABNORMAL HIGH (ref 0.0–5.0)
LDL Chol Calc (NIH): 33 mg/dL (ref 0–99)
Triglycerides: 468 mg/dL — ABNORMAL HIGH (ref 0–149)
VLDL Cholesterol Cal: 67 mg/dL — ABNORMAL HIGH (ref 5–40)

## 2023-08-31 ENCOUNTER — Other Ambulatory Visit: Payer: Self-pay | Admitting: Nurse Practitioner

## 2023-08-31 DIAGNOSIS — E1169 Type 2 diabetes mellitus with other specified complication: Secondary | ICD-10-CM

## 2023-09-10 ENCOUNTER — Other Ambulatory Visit: Payer: Self-pay | Admitting: Nurse Practitioner

## 2023-09-10 DIAGNOSIS — E119 Type 2 diabetes mellitus without complications: Secondary | ICD-10-CM

## 2023-09-21 ENCOUNTER — Other Ambulatory Visit: Payer: Self-pay | Admitting: Nurse Practitioner

## 2023-09-21 DIAGNOSIS — K219 Gastro-esophageal reflux disease without esophagitis: Secondary | ICD-10-CM

## 2023-10-23 DIAGNOSIS — E119 Type 2 diabetes mellitus without complications: Secondary | ICD-10-CM

## 2023-10-23 MED ORDER — INSULIN GLARGINE-YFGN 100 UNIT/ML ~~LOC~~ SOLN: 40.0000 [IU] | Freq: Every day | SUBCUTANEOUS | 1 refills | Status: DC

## 2023-11-03 ENCOUNTER — Ambulatory Visit (INDEPENDENT_AMBULATORY_CARE_PROVIDER_SITE_OTHER): Admitting: Orthopaedic Surgery

## 2023-11-03 ENCOUNTER — Other Ambulatory Visit (INDEPENDENT_AMBULATORY_CARE_PROVIDER_SITE_OTHER)

## 2023-11-03 ENCOUNTER — Encounter: Payer: Self-pay | Admitting: Orthopaedic Surgery

## 2023-11-03 DIAGNOSIS — M25551 Pain in right hip: Secondary | ICD-10-CM

## 2023-11-03 MED ORDER — GABAPENTIN 100 MG PO CAPS: 100.0000 mg | ORAL_CAPSULE | Freq: Every evening | ORAL | 3 refills | Status: DC | PRN

## 2023-11-03 MED ORDER — CYCLOBENZAPRINE HCL 5 MG PO TABS: 5.0000 mg | ORAL_TABLET | Freq: Three times a day (TID) | ORAL | 3 refills | Status: DC | PRN

## 2023-11-03 MED ORDER — PREDNISONE 10 MG (21) PO TBPK: ORAL_TABLET | ORAL | 3 refills | Status: DC

## 2023-11-03 NOTE — Progress Notes (Signed)
 Office Visit Note   Patient: Benjamin Hale           Date of Birth: 12-02-1965           MRN: [id number] Visit Date: 11/03/2023              Requested by: Delfina Feller, [name] [address] [place],  [place] [address] PCP: Delfina Feller, [name]   Assessment & Plan: Visit Diagnoses:  1. Pain in right hip     Plan: History of Present Illness COLON RUETH is a 58 year old male who presents with right hip and low back pain.  He experiences severe right hip and low back pain for two months, described as 'horrific' and radiating from the lower back to the ankle, with the most intense pain between the hip and knee. The pain is constant, worsens with sitting, lying on the affected side, and physical exertion, and disrupts sleep. Numbness is present on the inner leg, with severe pain on the outer leg. Pain occurs with internal rotation of the hip and resisting downward pressure on the leg, but not with log roll or pressure on the trochanteric bursa. Groin pain is similar to his usual pain.  Physical Exam MUSCULOSKELETAL: Internal rotation of the right hip causes pain. Resisted hip flexion causes pain in the right groin.  Results RADIOLOGY Hip X-ray: Well-preserved joint spaces, no degenerative arthritis, no fractures, no dislocations (11/03/2023) Lumbar Spine X-ray: Well-preserved disc spaces, straight lumbar spine (11/03/2023)  Assessment and Plan Low back pain with radiculopathy Chronic low back pain with radiculopathy affecting the right leg, likely radicular. X-rays show a straight lumbar spine without degenerative changes or fractures. Primary suspicion on back origin. - Prescribe a six-day course of prednisone  to reduce inflammation. - Consider trial of gabapentin . - Refer to physical therapy for further management.  Right hip pain Right hip pain with radiating pain, possibly due to overcompensation from back issues or inflammation. No significant x-ray  findings. - Prescribe a six-day course of prednisone  to address potential inflammation.  Follow-Up Instructions: No follow-ups on file.   Orders:  Orders Placed This Encounter  Procedures   XR HIP UNILAT W OR W/O PELVIS 2-3 VIEWS RIGHT   XR Lumbar Spine 2-3 Views   Ambulatory referral to Physical Therapy   Meds ordered this encounter  Medications   predniSONE  (STERAPRED UNI-PAK 21 TAB) 10 MG (21) TBPK tablet    Sig: Take as directed    Dispense:  21 tablet    Refill:  3   gabapentin  (NEURONTIN ) 100 MG capsule    Sig: Take 1-3 capsules (100-300 mg total) by mouth at bedtime as needed.    Dispense:  30 capsule    Refill:  3   cyclobenzaprine (FLEXERIL) 5 MG tablet    Sig: Take 1 tablet (5 mg total) by mouth 3 (three) times daily as needed for muscle spasms.    Dispense:  30 tablet    Refill:  3      Subjective: Chief Complaint  Patient presents with   Right Hip - Pain   Lower Back - Pain      Review of Systems  Constitutional: Negative.   HENT: Negative.    Eyes: Negative.   Respiratory: Negative.    Cardiovascular: Negative.   Gastrointestinal: Negative.   Endocrine: Negative.   Genitourinary: Negative.   Skin: Negative.   Allergic/Immunologic: Negative.   Neurological: Negative.   Hematological: Negative.   Psychiatric/Behavioral: Negative.  All other systems reviewed and are negative.    Objective: Vital Signs: There were no vitals taken for this visit.  Physical Exam Vitals and nursing note reviewed.  Constitutional:      Appearance: He is well-developed.  HENT:     Head: Normocephalic and atraumatic.  Eyes:     Pupils: Pupils are equal, round, and reactive to light.  Pulmonary:     Effort: Pulmonary effort is normal.  Abdominal:     Palpations: Abdomen is soft.  Musculoskeletal:        General: Normal range of motion.     Cervical back: Neck supple.  Skin:    General: Skin is warm.  Neurological:     Mental Status: He is alert and  oriented to person, place, and time.  Psychiatric:        Behavior: Behavior normal.        Thought Content: Thought content normal.        Judgment: Judgment normal.     Imaging: XR HIP UNILAT W OR W/O PELVIS 2-3 VIEWS RIGHT Result Date: 11/03/2023 X-rays of the right hip show mild bilateral hip osteoarthritis with preserved joint space.  XR Lumbar Spine 2-3 Views Result Date: 11/03/2023 X-rays of the lumbar spine show loss of lumbar lordosis.  No acute abnormalities.    PMFS History: Patient Active Problem List   Diagnosis Date Noted   BMI 31.0-31.9,adult 08/27/2023   Diabetes mellitus treated with oral medication (HCC) 12/05/2022   Chronic left shoulder pain 01/07/2022   Pseudoarthrosis of cervical spine (HCC) 07/19/2021   Restless leg syndrome 01/01/2021   Cervical neuropathic pain 01/01/2021   Depression, major, single episode, moderate (HCC) 06/23/2019   Long-term current use of injectable noninsulin antidiabetic medication 05/01/2014   Essential hypertension 05/01/2014   Hyperlipidemia associated with type 2 diabetes mellitus (HCC) 03/27/2014   Asthma 03/27/2014   GERD (gastroesophageal reflux disease) 03/27/2014   Past Medical History:  Diagnosis Date   Anginal pain (HCC)    Arthritis    "neck" (12/23/2012)   Asthma    Atrial flutter (HCC)    a. s/p cervical spine surgery 4/14 => s/p DCCV;  b.  Echo 09/2012:  Mild LVH, EF 60-65%, Gr 2 DD.c. s/p atrial flutter ablation 12/23/2012 by Dr Nunzio Belch   Cervical disc disease    s/p cervical spine surgery 4/14   COPD (chronic obstructive pulmonary disease) (HCC)    Diabetes mellitus without complication (HCC)    Dysrhythmia    Exertional shortness of breath    GERD (gastroesophageal reflux disease)    Headache    "weekly" (12/23/2012)   HTN (hypertension)    Hyperlipidemia    Hypogonadism male    Migraines    "once q 3-4 months" (12/23/2012)    Family History  Problem Relation Age of Onset   Lung cancer Mother         died @ 6   Brain cancer Father        died @ 51   Diabetes Maternal Uncle    Diabetes Maternal Grandfather     Past Surgical History:  Procedure Laterality Date   ANTERIOR CERVICAL DECOMP/DISCECTOMY FUSION N/A 09/22/2012   Procedure: ANTERIOR CERVICAL DECOMPRESSION/DISCECTOMY FUSION 2 LEVELS;  Surgeon: Adelbert Adler, MD;  Location: [facility];  Service: Neurosurgery;  Laterality: N/A;  Cervical five-six Cervical six-seven Anterior cervical decompression/diskectomy/fusion   ATRIAL FLUTTER ABLATION  12/23/2012   CTI ablation by Dr Nunzio Belch   ATRIAL FLUTTER ABLATION N/A 12/23/2012  Procedure: ATRIAL FLUTTER ABLATION;  Surgeon: Jolly Needle, MD;  Location: [facility] CATH LAB;  Service: Cardiovascular;  Laterality: N/A;   CARDIAC CATHETERIZATION N/A 11/16/2015   Procedure: Left Heart Cath and Coronary Angiography;  Surgeon: Arleen Lacer, MD;  Location: [facility] INVASIVE CV LAB;  Service: Cardiovascular;  Laterality: N/A;   CARDIOVERSION N/A 09/23/2012   Procedure: CARDIOVERSION;  Surgeon: Harlene Lie, MD;  Location: [facility] OR;  Service: Cardiovascular;  Laterality: N/A;   POSTERIOR CERVICAL FUSION/FORAMINOTOMY N/A 07/19/2021   Procedure: Cervical five- six Cervical six-seven Lateral Mass Fusion;  Surgeon: Augusto Blonder, MD;  Location: [facility] OR;  Service: Neurosurgery;  Laterality: N/A;   TONSILLECTOMY  1973   TREATMENT FISTULA ANAL  ~ 2007   WISDOM TOOTH EXTRACTION     Social History   Occupational History   Not on file  Tobacco Use   Smoking status: Former    Current packs/day: 0.00    Average packs/day: 1.5 packs/day for 22.0 years (33.0 ttl pk-yrs)    Types: Cigarettes    Start date: 06/17/1983    Quit date: 06/16/2005    Years since quitting: 18.3   Smokeless tobacco: Never  Vaping Use   Vaping status: Every Day  Substance and Sexual Activity   Alcohol use: Not Currently    Comment: rarely   Drug use: No   Sexual activity: Yes

## 2023-12-03 ENCOUNTER — Other Ambulatory Visit: Payer: Self-pay | Admitting: Nurse Practitioner

## 2023-12-03 DIAGNOSIS — F321 Major depressive disorder, single episode, moderate: Secondary | ICD-10-CM

## 2023-12-03 DIAGNOSIS — I10 Essential (primary) hypertension: Secondary | ICD-10-CM

## 2023-12-11 ENCOUNTER — Encounter: Payer: Self-pay | Admitting: Nurse Practitioner

## 2023-12-11 ENCOUNTER — Ambulatory Visit: Admitting: Nurse Practitioner

## 2023-12-11 VITALS — BP 126/78 | HR 57 | Temp 98.1°F | Ht 70.0 in | Wt 220.0 lb

## 2023-12-11 DIAGNOSIS — F321 Major depressive disorder, single episode, moderate: Secondary | ICD-10-CM | POA: Diagnosis not present

## 2023-12-11 DIAGNOSIS — E1169 Type 2 diabetes mellitus with other specified complication: Secondary | ICD-10-CM

## 2023-12-11 DIAGNOSIS — M79604 Pain in right leg: Secondary | ICD-10-CM

## 2023-12-11 DIAGNOSIS — E119 Type 2 diabetes mellitus without complications: Secondary | ICD-10-CM

## 2023-12-11 DIAGNOSIS — K219 Gastro-esophageal reflux disease without esophagitis: Secondary | ICD-10-CM

## 2023-12-11 DIAGNOSIS — I10 Essential (primary) hypertension: Secondary | ICD-10-CM | POA: Diagnosis not present

## 2023-12-11 DIAGNOSIS — Z7984 Long term (current) use of oral hypoglycemic drugs: Secondary | ICD-10-CM

## 2023-12-11 DIAGNOSIS — G2581 Restless legs syndrome: Secondary | ICD-10-CM

## 2023-12-11 DIAGNOSIS — E785 Hyperlipidemia, unspecified: Secondary | ICD-10-CM

## 2023-12-11 DIAGNOSIS — F411 Generalized anxiety disorder: Secondary | ICD-10-CM

## 2023-12-11 DIAGNOSIS — Z6831 Body mass index (BMI) 31.0-31.9, adult: Secondary | ICD-10-CM

## 2023-12-11 LAB — LIPID PANEL

## 2023-12-11 LAB — BAYER DCA HB A1C WAIVED: HB A1C (BAYER DCA - WAIVED): 7.7 % — ABNORMAL HIGH (ref 4.8–5.6)

## 2023-12-11 MED ORDER — GLIMEPIRIDE 4 MG PO TABS: 4.0000 mg | ORAL_TABLET | Freq: Every day | ORAL | 1 refills | Status: DC

## 2023-12-11 MED ORDER — AMLODIPINE BESYLATE 10 MG PO TABS: 10.0000 mg | ORAL_TABLET | Freq: Every day | ORAL | 1 refills | Status: DC

## 2023-12-11 MED ORDER — METHYLPREDNISOLONE ACETATE 80 MG/ML IJ SUSP
80.0000 mg | Freq: Once | INTRAMUSCULAR | Status: AC
  Administered 2023-12-11: 80 mg via INTRAMUSCULAR

## 2023-12-11 MED ORDER — METFORMIN HCL 1000 MG PO TABS: 1000.0000 mg | ORAL_TABLET | Freq: Two times a day (BID) | ORAL | 1 refills | Status: DC

## 2023-12-11 MED ORDER — INSULIN GLARGINE-YFGN 100 UNIT/ML ~~LOC~~ SOLN: 40.0000 [IU] | Freq: Every day | SUBCUTANEOUS | 1 refills | Status: DC

## 2023-12-11 MED ORDER — ESCITALOPRAM OXALATE 10 MG PO TABS: 10.0000 mg | ORAL_TABLET | Freq: Every day | ORAL | 1 refills | Status: DC

## 2023-12-11 MED ORDER — METOPROLOL TARTRATE 25 MG PO TABS: 25.0000 mg | ORAL_TABLET | Freq: Two times a day (BID) | ORAL | 0 refills | Status: DC

## 2023-12-11 MED ORDER — OMEPRAZOLE 20 MG PO CPDR: DELAYED_RELEASE_CAPSULE | ORAL | 1 refills | Status: DC

## 2023-12-11 MED ORDER — KETOROLAC TROMETHAMINE 60 MG/2ML IM SOLN
60.0000 mg | Freq: Once | INTRAMUSCULAR | Status: AC
Start: 2023-12-11 — End: 2023-12-11
  Administered 2023-12-11: 60 mg via INTRAMUSCULAR

## 2023-12-11 MED ORDER — LISINOPRIL 40 MG PO TABS: 40.0000 mg | ORAL_TABLET | Freq: Every day | ORAL | 1 refills | Status: DC

## 2023-12-11 MED ORDER — CELECOXIB 200 MG PO CAPS: 200.0000 mg | ORAL_CAPSULE | Freq: Two times a day (BID) | ORAL | 1 refills | Status: DC

## 2023-12-11 NOTE — Patient Instructions (Signed)
Radicular Pain Radicular pain is a type of pain that spreads from your back or neck along a spinal nerve. Spinal nerves are nerves that leave the spinal cord and go to the muscles. Radicular pain is sometimes called radiculopathy, radiculitis, or a pinched nerve. When you have this type of pain, you may also have weakness, numbness, or tingling in the area of your body that is supplied by the nerve. The pain may feel sharp and burning. Depending on which spinal nerve is affected, the pain may occur in the: Neck area (cervical radicular pain). You may also feel pain, numbness, weakness, or tingling in the arms. Mid-spine area (thoracic radicular pain). You would feel this pain in the back and chest. This type is rare. Lower back area (lumbar radicular pain). You would feel this pain as low back pain. You may feel pain, numbness, weakness, or tingling in the buttocks or legs. Sciatica is a type of lumbar radicular pain that shoots down the back of the leg. Radicular pain occurs when one of the spinal nerves becomes irritated or squeezed (compressed). It is often caused by something pushing on a spinal nerve, such as one of the bones of the spine (vertebrae) or one of the round cushions between vertebrae (intervertebral disks). This can result from: An injury. Wear and tear or aging of a disk. The growth of a bone spur that pushes on the nerve. Radicular pain often goes away when you follow instructions from your health care provider for relieving pain at home. How is this treated? Treatment may depend on the cause of the condition and may include: Working with a physical therapist. Taking pain medicine. Applying heat or ice or both to the affected areas. Doing stretches to improve flexibility. Having surgery. This may be needed if other treatments do not help. Different types of surgery may be done depending on the cause of this condition. Follow these instructions at home: Managing pain     If  directed, put ice on the affected area. To do this: Put ice in a plastic bag. Place a towel between your skin and the bag. Leave the ice on for 20 minutes, 2-3 times a day. Remove the ice if your skin turns bright red. This is very important. If you cannot feel pain, heat, or cold, you have a greater risk of damage to the area. If directed, apply heat to the affected area as often as told by your health care provider. Use the heat source that your health care provider recommends, such as a moist heat pack or a heating pad. Place a towel between your skin and the heat source. Leave the heat on for 20-30 minutes. Remove the heat if your skin turns bright red. This is especially important if you are unable to feel pain, heat, or cold. You have a greater risk of getting burned. Activity Do not sit or rest in bed for long periods of time. Try to stay as active as possible. Ask your health care provider what type of exercise or activity is best for you. Avoid activities that make your pain worse, such as bending and lifting. You may have to avoid lifting. Ask your health care provider how much you can safely lift. Practice using proper technique when lifting items. Proper lifting technique involves bending your knees and rising up. Do strength and range-of-motion exercises only as told by your health care provider or physical therapist. General instructions Take over-the-counter and prescription medicines only as told by your  health care provider. Pay attention to any changes in your symptoms. Keep all follow-up visits. This is important. Contact a health care provider if: Your pain and other symptoms get worse. Your pain medicine is not helping. Your pain has not improved after a few weeks of home care. You have a fever. Get help right away if: You have severe pain, weakness, or numbness. You have difficulty with bladder or bowel control. Summary Radicular pain is a type of pain that spreads  from your back or neck along a spinal nerve. When you have radicular pain, you may also have weakness, numbness, or tingling in the area of your body that is supplied by the nerve. The pain may feel sharp or burning. Radicular pain may be treated with ice, heat, medicines, or physical therapy. This information is not intended to replace advice given to you by your health care provider. Make sure you discuss any questions you have with your health care provider. Document Revised: 12/06/2020 Document Reviewed: 12/06/2020 Elsevier Patient Education  2024 [employer].

## 2023-12-11 NOTE — Progress Notes (Signed)
 Subjective:    Patient ID: Benjamin Hale, male    DOB: March 25, 1966, 58 y.o.   MRN: [id number]   Chief Complaint: medical management of chronic issues     HPI:  Benjamin Hale is a 58 y.o. who identifies as a male who was assigned male at birth.   Social history: Lives with: wife and son Work history: [employer]   Comes in today for follow up of the following chronic medical issues:  1. Essential hypertension No c/o chest pain, sob or headache. Does not check blood pressure at home. BP Readings from Last 3 Encounters:  08/27/23 136/82  06/08/23 132/77  04/24/23 (!) 145/74     2. Diabetes mellitus treated with oral medication (HCC) Fasting blood sugars are running around 188. He was switched form libre to dexcom and the dexcom has been reading high. He has started taking alprolipoic acid. Has really helped his blood sugars Lab Results  Component Value Date   HGBA1C 8.4 (H) 08/27/2023     3. Hyperlipidemia associated with type 2 diabetes mellitus (HCC) He does not really wathc his diet and does no dedicated exercise. Lab Results  Component Value Date   CHOL 114 08/27/2023   HDL 14 (L) 08/27/2023   LDLCALC 33 08/27/2023   LDLDIRECT 62 12/05/2022   TRIG 468 (H) 08/27/2023   CHOLHDL 8.1 (H) 08/27/2023     4. Gastroesophageal reflux disease without esophagitis Is on omeprazole  and is doing well.  5. Depression, major, single episode, moderate (HCC) Is on lexapro  and has no issues.    12/11/2023    9:28 AM 06/08/2023    8:24 AM 04/24/2023    3:43 PM  Depression screen PHQ 2/9  Decreased Interest 0 0 0  Down, Depressed, Hopeless 0 0 0  PHQ - 2 Score 0 0 0  Altered sleeping 0  0  Tired, decreased energy 0  0  Change in appetite 0  0  Feeling bad or failure about yourself  0  0  Trouble concentrating 0  0  Moving slowly or fidgety/restless 0  0  Suicidal thoughts 0  0  PHQ-9 Score 0  0  Difficult doing work/chores Not difficult at all  Not difficult at all       6. Restless leg syndrome Takes valium  on as needed basis  7. BMI 31.0-31.9,adult Weight is down 5lbs  Wt Readings from Last 3 Encounters:  12/11/23 220 lb (99.8 kg)  08/27/23 225 lb (102.1 kg)  06/08/23 225 lb (102.1 kg)   BMI Readings from Last 3 Encounters:  12/11/23 31.57 kg/m  08/27/23 32.28 kg/m  06/08/23 32.28 kg/m        New complaints: - right hip pain- saw ortho 2 weeks  ago and they really did not do anything for him. Pain wraps around groin and down leg.   No Known Allergies Outpatient Encounter Medications as of 12/11/2023  Medication Sig   albuterol  (VENTOLIN  HFA) 108 (90 Base) MCG/ACT inhaler Inhale 2 puffs into the lungs every 6 (six) hours as needed for wheezing or shortness of breath.   amLODipine  (NORVASC ) 10 MG tablet TAKE 1 TABLET DAILY   aspirin  EC 81 MG tablet Take 81 mg by mouth daily.   Continuous Glucose Receiver (DEXCOM G7 RECEIVER) DEVI 1 each by Does not apply route daily.   Continuous Glucose Sensor (DEXCOM G7 SENSOR) MISC CHANGE SENSOR EVERY 10 DAYS   cyclobenzaprine  (FLEXERIL ) 5 MG tablet Take 1 tablet (5 mg total) by  mouth 3 (three) times daily as needed for muscle spasms.   diazepam  (VALIUM ) 5 MG tablet TAKE 1 TABLET EVERY 12 HOURS AS NEEDED FOR ANXIETY   escitalopram  (LEXAPRO ) 10 MG tablet TAKE 1 TABLET DAILY   fenofibrate  (TRICOR ) 145 MG tablet TAKE 1 TABLET DAILY   fluticasone  (FLONASE ) 50 MCG/ACT nasal spray USE 2 SPRAYS IN EACH NOSTRIL DAILY AS NEEDED FOR RHINITIS OR ALLERGIES   gabapentin  (NEURONTIN ) 100 MG capsule Take 1-3 capsules (100-300 mg total) by mouth at bedtime as needed.   glimepiride  (AMARYL ) 4 MG tablet TAKE 1 TABLET DAILY BEFORE BREAKFAST   ibuprofen (ADVIL,MOTRIN) 200 MG tablet Take 600 mg by mouth every 6 (six) hours as needed (For headache or pain.).   insulin  glargine-yfgn (SEMGLEE , YFGN,) 100 UNIT/ML injection Inject 0.4 mLs (40 Units total) into the skin daily.   Insulin  Pen Needle (BD PEN NEEDLE MICRO  U/F) 32G X 6 MM MISC USE TO GIVE SEMGLEE  AS NEEDED   lisinopril  (ZESTRIL ) 40 MG tablet TAKE 1 TABLET DAILY   metFORMIN  (GLUCOPHAGE ) 1000 MG tablet TAKE 1 TABLET TWICE A DAY WITH MEALS   metoprolol  tartrate (LOPRESSOR ) 25 MG tablet TAKE 1 TABLET TWICE A DAY   Multiple Vitamin (MULTIVITAMIN WITH MINERALS) TABS tablet Take 1 tablet by mouth daily.   nitroGLYCERIN  (NITROSTAT ) 0.4 MG SL tablet Place 1 tablet (0.4 mg total) under the tongue every 5 (five) minutes as needed for chest pain.   omega-3 acid ethyl esters (LOVAZA ) 1 g capsule Take 1 capsule (1 g total) by mouth 2 (two) times daily.   omeprazole  (PRILOSEC) 20 MG capsule TAKE 1 CAPSULE DAILY AS NEEDED FOR ACID REFLUX   predniSONE  (STERAPRED UNI-PAK 21 TAB) 10 MG (21) TBPK tablet Take as directed   Probiotic Product (PROBIOTIC PO) Take 1 capsule by mouth daily.   No facility-administered encounter medications on file as of 12/11/2023.    Past Surgical History:  Procedure Laterality Date   ANTERIOR CERVICAL DECOMP/DISCECTOMY FUSION N/A 09/22/2012   Procedure: ANTERIOR CERVICAL DECOMPRESSION/DISCECTOMY FUSION 2 LEVELS;  Surgeon: Catalina CHRISTELLA Stains, MD;  Location: [facility];  Service: Neurosurgery;  Laterality: N/A;  Cervical five-six Cervical six-seven Anterior cervical decompression/diskectomy/fusion   ATRIAL FLUTTER ABLATION  12/23/2012   CTI ablation by Dr Kelsie   ATRIAL FLUTTER ABLATION N/A 12/23/2012   Procedure: ATRIAL FLUTTER ABLATION;  Surgeon: Lynwood Kelsie, MD;  Location: [facility] CATH LAB;  Service: Cardiovascular;  Laterality: N/A;   CARDIAC CATHETERIZATION N/A 11/16/2015   Procedure: Left Heart Cath and Coronary Angiography;  Surgeon: Alm LELON Clay, MD;  Location: [facility] INVASIVE CV LAB;  Service: Cardiovascular;  Laterality: N/A;   CARDIOVERSION N/A 09/23/2012   Procedure: CARDIOVERSION;  Surgeon: Debby JAYSON Core, MD;  Location: [facility] OR;  Service: Cardiovascular;  Laterality: N/A;   POSTERIOR CERVICAL FUSION/FORAMINOTOMY N/A 07/19/2021    Procedure: Cervical five- six Cervical six-seven Lateral Mass Fusion;  Surgeon: Lanis Pupa, MD;  Location: [facility] OR;  Service: Neurosurgery;  Laterality: N/A;   TONSILLECTOMY  1973   TREATMENT FISTULA ANAL  ~ 2007   WISDOM TOOTH EXTRACTION      Family History  Problem Relation Age of Onset   Lung cancer Mother        died @ 56   Brain cancer Father        died @ 32   Diabetes Maternal Uncle    Diabetes Maternal Grandfather       Controlled substance contract: n/a     Review of Systems  Constitutional:  Negative for diaphoresis.  Eyes:  Negative for pain.  Respiratory:  Negative for shortness of breath.   Cardiovascular:  Negative for chest pain, palpitations and leg swelling.  Gastrointestinal:  Negative for abdominal pain.  Endocrine: Negative for polydipsia.  Skin:  Negative for rash.  Neurological:  Negative for dizziness, weakness and headaches.  Hematological:  Does not bruise/bleed easily.  All other systems reviewed and are negative.      Objective:   Physical Exam Vitals and nursing note reviewed.  Constitutional:      Appearance: Normal appearance. He is well-developed.  HENT:     Head: Normocephalic.     Nose: Nose normal.     Mouth/Throat:     Mouth: Mucous membranes are moist.     Pharynx: Oropharynx is clear.     Comments: Erythematous pumps on uvula  Eyes:     Pupils: Pupils are equal, round, and reactive to light.   Neck:     Thyroid : No thyroid  mass or thyromegaly.     Vascular: No carotid bruit or JVD.     Trachea: Phonation normal.   Cardiovascular:     Rate and Rhythm: Normal rate and regular rhythm.  Pulmonary:     Effort: Pulmonary effort is normal. No respiratory distress.     Breath sounds: Normal breath sounds.  Abdominal:     General: Bowel sounds are normal.     Palpations: Abdomen is soft.     Tenderness: There is no abdominal tenderness.   Musculoskeletal:        General: Normal range of motion.     Cervical back:  Normal range of motion and neck supple.  Lymphadenopathy:     Cervical: No cervical adenopathy.   Skin:    General: Skin is warm and dry.   Neurological:     Mental Status: He is alert and oriented to person, place, and time.   Psychiatric:        Behavior: Behavior normal.        Thought Content: Thought content normal.        Judgment: Judgment normal.    BP 126/78   Pulse (!) 57   Temp 98.1 F (36.7 C) (Temporal)   Ht 5' 10 (1.778 m)   Wt 220 lb (99.8 kg)   SpO2 95%   BMI 31.57 kg/m      Hgba1c 7.7%       Assessment & Plan:  Benjamin Hale comes in today with chief complaint of medical management of chronic issues    Diagnosis and orders addressed:  1. Essential hypertension Low sodium diet - CBC with Differential/Platelet - CMP14+EGFR - amLODipine  (NORVASC ) 10 MG tablet; Take 1 tablet (10 mg total) by mouth daily.  Dispense: 90 tablet; Refill: 1 - lisinopril  (ZESTRIL ) 40 MG tablet; Take 1 tablet (40 mg total) by mouth daily.  Dispense: 90 tablet; Refill: 1 - metoprolol  tartrate (LOPRESSOR ) 25 MG tablet; Take 1 tablet (25 mg total) by mouth 2 (two) times daily.  Dispense: 180 tablet; Refill: 1  2. Diabetes mellitus treated with oral medication (HCC) Stricter carb counting Continue current meds - Bayer DCA Hb A1c Waived - glimepiride  (AMARYL ) 4 MG tablet; Take 1 tablet (4 mg total) by mouth daily before breakfast.  Dispense: 90 tablet; Refill: 1 - insulin  glargine-yfgn (SEMGLEE , YFGN,) 100 UNIT/ML injection; Inject 0.34 mLs (34 Units total) into the skin daily.  Dispense: 30 mL; Refill: 1 - metFORMIN  (GLUCOPHAGE ) 1000 MG tablet; Take 1 tablet (1,000  mg total) by mouth 2 (two) times daily with a meal.  Dispense: 180 tablet; Refill: 1  3. Hyperlipidemia associated with type 2 diabetes mellitus (HCC) Low fat diet - Lipid panel - fenofibrate  (TRICOR ) 145 MG tablet; Take 1 tablet (145 mg total) by mouth daily.  Dispense: 90 tablet; Refill: 1  4.  Gastroesophageal reflux disease without esophagitis Avoid spicy foods Do not eat 2 hours prior to bedtime - omeprazole  (PRILOSEC) 20 MG capsule; TAKE 1 CAPSULE DAILY AS NEEDED FOR ACID REFLUX  Dispense: 90 capsule; Refill: 1  5. Depression, major, single episode, moderate (HCC) Stress management - escitalopram  (LEXAPRO ) 10 MG tablet; TAKE 1 TABLET DAILY  Dispense: 90 tablet; Refill: 1  6. Restless leg syndrome  7. BMI 31.0-31.9,adult Discussed diet and exercise for person with BMI >25 Will recheck weight in 3-6 months   8. GAD (generalized anxiety disorder) Stress management - diazepam  (VALIUM ) 5 MG tablet; TAKE 1 TABLET EVERY 12 HOURS AS NEEDED FOR ANXIETY  Dispense: 90 tablet; Refill: 1  9. Right leg pain Depomedrol 80mg  and toradol 60mg   today Celebrex 200mg  BID Moist heat rest  Labs pending Health Maintenance reviewed Diet and exercise encouraged  Follow up plan: 3 months   Mary-Margaret Gladis, [name]

## 2023-12-12 ENCOUNTER — Other Ambulatory Visit: Payer: Self-pay | Admitting: Nurse Practitioner

## 2023-12-12 DIAGNOSIS — E119 Type 2 diabetes mellitus without complications: Secondary | ICD-10-CM

## 2023-12-12 LAB — CBC WITH DIFFERENTIAL/PLATELET
Basophils Absolute: 0.1 10*3/uL (ref 0.0–0.2)
Basos: 1 %
EOS (ABSOLUTE): 0.2 10*3/uL (ref 0.0–0.4)
Eos: 2 %
Hematocrit: 43.2 % (ref 37.5–51.0)
Hemoglobin: 14.3 g/dL (ref 13.0–17.7)
Immature Grans (Abs): 0 10*3/uL (ref 0.0–0.1)
Immature Granulocytes: 0 %
Lymphocytes Absolute: 1.7 10*3/uL (ref 0.7–3.1)
Lymphs: 25 %
MCH: 28.7 pg (ref 26.6–33.0)
MCHC: 33.1 g/dL (ref 31.5–35.7)
MCV: 87 fL (ref 79–97)
Monocytes Absolute: 0.5 10*3/uL (ref 0.1–0.9)
Monocytes: 7 %
Neutrophils Absolute: 4.4 10*3/uL (ref 1.4–7.0)
Neutrophils: 65 %
Platelets: 303 10*3/uL (ref 150–450)
RBC: 4.99 x10E6/uL (ref 4.14–5.80)
RDW: 13.2 % (ref 11.6–15.4)
WBC: 6.8 10*3/uL (ref 3.4–10.8)

## 2023-12-12 LAB — CMP14+EGFR
ALT: 28 IU/L (ref 0–44)
AST: 31 IU/L (ref 0–40)
Albumin: 4.5 g/dL (ref 3.8–4.9)
Alkaline Phosphatase: 65 IU/L (ref 44–121)
BUN/Creatinine Ratio: 17 (ref 9–20)
BUN: 15 mg/dL (ref 6–24)
Bilirubin Total: 0.7 mg/dL (ref 0.0–1.2)
CO2: 17 mmol/L — ABNORMAL LOW (ref 20–29)
Calcium: 9.2 mg/dL (ref 8.7–10.2)
Chloride: 103 mmol/L (ref 96–106)
Creatinine, Ser: 0.9 mg/dL (ref 0.76–1.27)
Globulin, Total: 2.6 g/dL (ref 1.5–4.5)
Glucose: 180 mg/dL — ABNORMAL HIGH (ref 70–99)
Potassium: 4.3 mmol/L (ref 3.5–5.2)
Sodium: 140 mmol/L (ref 134–144)
Total Protein: 7.1 g/dL (ref 6.0–8.5)
eGFR: 100 mL/min/{1.73_m2} (ref >59)

## 2023-12-12 LAB — LIPID PANEL
Cholesterol, Total: 122 mg/dL (ref 100–199)
HDL: 16 mg/dL — AB (ref >39)
LDL CALC COMMENT:: 7.6 ratio — AB (ref 0.0–5.0)
LDL Chol Calc (NIH): 72 mg/dL (ref 0–99)
Triglycerides: 200 mg/dL — AB (ref 0–149)
VLDL Cholesterol Cal: 34 mg/dL (ref 5–40)

## 2023-12-14 ENCOUNTER — Ambulatory Visit

## 2023-12-14 ENCOUNTER — Ambulatory Visit: Payer: Self-pay | Admitting: Nurse Practitioner

## 2023-12-17 LAB — TOXASSURE SELECT 13 (MW), URINE

## 2024-01-05 ENCOUNTER — Other Ambulatory Visit: Payer: Self-pay | Admitting: Nurse Practitioner

## 2024-01-05 DIAGNOSIS — E1169 Type 2 diabetes mellitus with other specified complication: Secondary | ICD-10-CM

## 2024-01-12 ENCOUNTER — Other Ambulatory Visit: Payer: Self-pay | Admitting: Nurse Practitioner

## 2024-01-12 DIAGNOSIS — J069 Acute upper respiratory infection, unspecified: Secondary | ICD-10-CM

## 2024-01-12 DIAGNOSIS — J40 Bronchitis, not specified as acute or chronic: Secondary | ICD-10-CM

## 2024-01-17 ENCOUNTER — Other Ambulatory Visit: Payer: Self-pay | Admitting: Nurse Practitioner

## 2024-01-18 ENCOUNTER — Other Ambulatory Visit: Payer: Self-pay | Admitting: *Deleted

## 2024-01-18 DIAGNOSIS — M79604 Pain in right leg: Secondary | ICD-10-CM

## 2024-01-18 MED ORDER — CELECOXIB 200 MG PO CAPS
200.0000 mg | ORAL_CAPSULE | Freq: Two times a day (BID) | ORAL | 0 refills | Status: DC
Start: 2024-01-18 — End: 2024-03-07

## 2024-02-22 ENCOUNTER — Other Ambulatory Visit: Payer: Self-pay | Admitting: Nurse Practitioner

## 2024-02-22 DIAGNOSIS — E1169 Type 2 diabetes mellitus with other specified complication: Secondary | ICD-10-CM

## 2024-02-26 ENCOUNTER — Ambulatory Visit: Admitting: Nurse Practitioner

## 2024-03-07 ENCOUNTER — Other Ambulatory Visit: Payer: Self-pay

## 2024-03-07 DIAGNOSIS — I10 Essential (primary) hypertension: Secondary | ICD-10-CM

## 2024-03-07 DIAGNOSIS — M79604 Pain in right leg: Secondary | ICD-10-CM

## 2024-03-07 DIAGNOSIS — E1169 Type 2 diabetes mellitus with other specified complication: Secondary | ICD-10-CM

## 2024-03-07 DIAGNOSIS — K219 Gastro-esophageal reflux disease without esophagitis: Secondary | ICD-10-CM

## 2024-03-07 DIAGNOSIS — J069 Acute upper respiratory infection, unspecified: Secondary | ICD-10-CM

## 2024-03-07 DIAGNOSIS — F321 Major depressive disorder, single episode, moderate: Secondary | ICD-10-CM

## 2024-03-07 DIAGNOSIS — E119 Type 2 diabetes mellitus without complications: Secondary | ICD-10-CM

## 2024-03-07 DIAGNOSIS — J40 Bronchitis, not specified as acute or chronic: Secondary | ICD-10-CM

## 2024-03-07 MED ORDER — GABAPENTIN 100 MG PO CAPS: 100.0000 mg | ORAL_CAPSULE | Freq: Every evening | ORAL | 3 refills | Status: DC | PRN

## 2024-03-07 MED ORDER — AMLODIPINE BESYLATE 10 MG PO TABS: 10.0000 mg | ORAL_TABLET | Freq: Every day | ORAL | 1 refills | Status: DC

## 2024-03-07 MED ORDER — EMBECTA PEN NEEDLE ULTRAFINE 32G X 6 MM MISC: 10 refills | Status: AC

## 2024-03-07 MED ORDER — CELECOXIB 200 MG PO CAPS: 200.0000 mg | ORAL_CAPSULE | Freq: Two times a day (BID) | ORAL | 0 refills | Status: DC

## 2024-03-07 MED ORDER — OMEGA-3-ACID ETHYL ESTERS 1 G PO CAPS: 1.0000 | ORAL_CAPSULE | Freq: Two times a day (BID) | ORAL | 0 refills | Status: DC

## 2024-03-07 MED ORDER — OMEPRAZOLE 20 MG PO CPDR: DELAYED_RELEASE_CAPSULE | ORAL | 1 refills | Status: DC

## 2024-03-07 MED ORDER — CYCLOBENZAPRINE HCL 5 MG PO TABS: 5.0000 mg | ORAL_TABLET | Freq: Three times a day (TID) | ORAL | 3 refills | Status: AC | PRN

## 2024-03-07 MED ORDER — FLUTICASONE PROPIONATE 50 MCG/ACT NA SUSP: 1.0000 | Freq: Every day | NASAL | 1 refills | Status: AC

## 2024-03-07 MED ORDER — GLIMEPIRIDE 4 MG PO TABS: 4.0000 mg | ORAL_TABLET | Freq: Every day | ORAL | 1 refills | Status: DC

## 2024-03-07 MED ORDER — METFORMIN HCL 1000 MG PO TABS: 1000.0000 mg | ORAL_TABLET | Freq: Two times a day (BID) | ORAL | 1 refills | Status: DC

## 2024-03-07 MED ORDER — FENOFIBRATE 145 MG PO TABS: 145.0000 mg | ORAL_TABLET | Freq: Every day | ORAL | 0 refills | Status: DC

## 2024-03-07 MED ORDER — ESCITALOPRAM OXALATE 10 MG PO TABS: 10.0000 mg | ORAL_TABLET | Freq: Every day | ORAL | 1 refills | Status: DC

## 2024-03-07 MED ORDER — INSULIN GLARGINE-YFGN 100 UNIT/ML ~~LOC~~ SOLN: 40.0000 [IU] | Freq: Every day | SUBCUTANEOUS | 1 refills | Status: DC

## 2024-03-07 MED ORDER — METOPROLOL TARTRATE 25 MG PO TABS: 25.0000 mg | ORAL_TABLET | Freq: Two times a day (BID) | ORAL | 0 refills | Status: DC

## 2024-03-07 MED ORDER — LISINOPRIL 40 MG PO TABS: 40.0000 mg | ORAL_TABLET | Freq: Every day | ORAL | 1 refills | Status: DC

## 2024-03-08 MED ORDER — DEXCOM G7 SENSOR MISC: 3 refills | Status: AC

## 2024-03-08 MED ORDER — BASAGLAR KWIKPEN 100 UNIT/ML ~~LOC~~ SOPN: 40.0000 [IU] | PEN_INJECTOR | Freq: Every day | SUBCUTANEOUS | 1 refills | Status: AC

## 2024-03-12 DIAGNOSIS — R070 Pain in throat: Secondary | ICD-10-CM | POA: Diagnosis not present

## 2024-03-12 DIAGNOSIS — R052 Subacute cough: Secondary | ICD-10-CM | POA: Diagnosis not present

## 2024-03-12 DIAGNOSIS — J069 Acute upper respiratory infection, unspecified: Secondary | ICD-10-CM | POA: Diagnosis not present

## 2024-04-04 ENCOUNTER — Other Ambulatory Visit: Payer: Self-pay | Admitting: Nurse Practitioner

## 2024-04-04 DIAGNOSIS — E1169 Type 2 diabetes mellitus with other specified complication: Secondary | ICD-10-CM

## 2024-04-05 ENCOUNTER — Ambulatory Visit: Admitting: Nurse Practitioner

## 2024-04-15 ENCOUNTER — Ambulatory Visit (INDEPENDENT_AMBULATORY_CARE_PROVIDER_SITE_OTHER): Admitting: Nurse Practitioner

## 2024-04-15 ENCOUNTER — Encounter: Payer: Self-pay | Admitting: Nurse Practitioner

## 2024-04-15 VITALS — BP 138/76 | HR 65 | Temp 98.4°F | Ht 70.0 in | Wt 229.0 lb

## 2024-04-15 DIAGNOSIS — E785 Hyperlipidemia, unspecified: Secondary | ICD-10-CM | POA: Diagnosis not present

## 2024-04-15 DIAGNOSIS — G2581 Restless legs syndrome: Secondary | ICD-10-CM

## 2024-04-15 DIAGNOSIS — E119 Type 2 diabetes mellitus without complications: Secondary | ICD-10-CM

## 2024-04-15 DIAGNOSIS — Z6831 Body mass index (BMI) 31.0-31.9, adult: Secondary | ICD-10-CM

## 2024-04-15 DIAGNOSIS — I10 Essential (primary) hypertension: Secondary | ICD-10-CM

## 2024-04-15 DIAGNOSIS — E1169 Type 2 diabetes mellitus with other specified complication: Secondary | ICD-10-CM | POA: Diagnosis not present

## 2024-04-15 DIAGNOSIS — K219 Gastro-esophageal reflux disease without esophagitis: Secondary | ICD-10-CM

## 2024-04-15 DIAGNOSIS — F321 Major depressive disorder, single episode, moderate: Secondary | ICD-10-CM

## 2024-04-15 DIAGNOSIS — Z7984 Long term (current) use of oral hypoglycemic drugs: Secondary | ICD-10-CM

## 2024-04-15 LAB — BAYER DCA HB A1C WAIVED: HB A1C (BAYER DCA - WAIVED): 6 % — ABNORMAL HIGH (ref 4.8–5.6)

## 2024-04-15 LAB — LIPID PANEL

## 2024-04-15 MED ORDER — METOPROLOL TARTRATE 25 MG PO TABS: 25.0000 mg | ORAL_TABLET | Freq: Two times a day (BID) | ORAL | 1 refills | Status: DC

## 2024-04-15 NOTE — Progress Notes (Signed)
 Subjective:    Patient ID: Benjamin Hale, male    DOB: [birth year], 58 y.o.   MRN: [id number]   Chief Complaint: medical management of chronic issues     HPI:  Benjamin Hale is a 58 y.o. who identifies as a male who was assigned male at birth.   Social history: Lives with: wife and son Work history: [employer]   Comes in today for follow up of the following chronic medical issues:  1. Essential hypertension No c/o chest pain, sob or headache. Does not check blood pressure at home. BP Readings from Last 3 Encounters:  12/11/23 126/78  08/27/23 136/82  06/08/23 132/77     2. Diabetes mellitus treated with oral medication (HCC) Fasting blood sugars are running around 188. He was switched form libre to dexcom and the dexcom has been reading high. He has started taking alprolipoic acid. Has really helped his blood sugars. No changes made to prescription meds. He has been traveling a lot for work and not eating like he should when he is out of town. Lab Results  Component Value Date   HGBA1C 7.7 (H) 12/11/2023     3. Hyperlipidemia associated with type 2 diabetes mellitus (HCC) He does not really wathc his diet and does no dedicated exercise. Lab Results  Component Value Date   CHOL 122 12/11/2023   HDL 16 (L) 12/11/2023   LDLCALC 72 12/11/2023   LDLDIRECT 62 12/05/2022   TRIG 200 (H) 12/11/2023   CHOLHDL 7.6 (H) 12/11/2023   The ASCVD Risk score (Arnett DK, et al., 2019) failed to calculate for the following reasons:   The valid HDL cholesterol range is 20 to 100 mg/dL   The valid total cholesterol range is 130 to 320 mg/dL   4. Gastroesophageal reflux disease without esophagitis Is on omeprazole  and is doing well.  5. Depression, major, single episode, moderate (HCC) Is on lexapro  and has no issues.    04/15/2024    3:24 PM 12/11/2023    9:28 AM 06/08/2023    8:24 AM  Depression screen PHQ 2/9  Decreased Interest 0 0 0  Down, Depressed, Hopeless 0 0 0   PHQ - 2 Score 0 0 0  Altered sleeping 0 0   Tired, decreased energy 0 0   Change in appetite 0 0   Feeling bad or failure about yourself  0 0   Trouble concentrating 0 0   Moving slowly or fidgety/restless 0 0   Suicidal thoughts 0 0   PHQ-9 Score 0 0   Difficult doing work/chores Not difficult at all Not difficult at all        6. Restless leg syndrome Takes valium  on as needed basis  7. BMI 31.0-31.9,adult Weight is up 9lbs   Wt Readings from Last 3 Encounters:  04/15/24 229 lb (103.9 kg)  12/11/23 220 lb (99.8 kg)  08/27/23 225 lb (102.1 kg)   BMI Readings from Last 3 Encounters:  04/15/24 32.86 kg/m  12/11/23 31.57 kg/m  08/27/23 32.28 kg/m    New complaints: None  today  No Known Allergies Outpatient Encounter Medications as of 04/15/2024  Medication Sig   albuterol  (VENTOLIN  HFA) 108 (90 Base) MCG/ACT inhaler Inhale 2 puffs into the lungs every 6 (six) hours as needed for wheezing or shortness of breath.   ALPHA LIPOIC ACID PO Take by mouth.   amLODipine  (NORVASC ) 10 MG tablet Take 1 tablet (10 mg total) by mouth daily.   aspirin  EC 81  MG tablet Take 81 mg by mouth daily.   celecoxib  (CELEBREX ) 200 MG capsule Take 1 capsule (200 mg total) by mouth 2 (two) times daily.   Continuous Glucose Receiver (DEXCOM G7 RECEIVER) DEVI 1 each by Does not apply route daily.   Continuous Glucose Sensor (DEXCOM G7 SENSOR) MISC CHANGE SENSOR EVERY 10 DAYS   cyclobenzaprine  (FLEXERIL ) 5 MG tablet Take 1 tablet (5 mg total) by mouth 3 (three) times daily as needed for muscle spasms.   diazepam  (VALIUM ) 5 MG tablet TAKE 1 TABLET EVERY 12 HOURS AS NEEDED FOR ANXIETY   escitalopram  (LEXAPRO ) 10 MG tablet Take 1 tablet (10 mg total) by mouth daily.   fenofibrate  (TRICOR ) 145 MG tablet Take 1 tablet (145 mg total) by mouth daily.   fluticasone  (FLONASE ) 50 MCG/ACT nasal spray Place 1 spray into both nostrils daily.   gabapentin  (NEURONTIN ) 100 MG capsule Take 1-3 capsules  (100-300 mg total) by mouth at bedtime as needed.   glimepiride  (AMARYL ) 4 MG tablet Take 1 tablet (4 mg total) by mouth daily before breakfast.   ibuprofen (ADVIL,MOTRIN) 200 MG tablet Take 600 mg by mouth every 6 (six) hours as needed (For headache or pain.).   Insulin  Glargine (BASAGLAR  KWIKPEN) 100 UNIT/ML Inject 40 Units into the skin daily.   insulin  glargine-yfgn (SEMGLEE , YFGN,) 100 UNIT/ML injection Inject 0.4 mLs (40 Units total) into the skin daily.   Insulin  Pen Needle (EMBECTA PEN NEEDLE ULTRAFINE) 32G X 6 MM MISC UAD to give Semglee  Dx E11.9   lisinopril  (ZESTRIL ) 40 MG tablet Take 1 tablet (40 mg total) by mouth daily.   metFORMIN  (GLUCOPHAGE ) 1000 MG tablet Take 1 tablet (1,000 mg total) by mouth 2 (two) times daily with a meal.   metoprolol  tartrate (LOPRESSOR ) 25 MG tablet Take 1 tablet (25 mg total) by mouth 2 (two) times daily.   Multiple Vitamin (MULTIVITAMIN WITH MINERALS) TABS tablet Take 1 tablet by mouth daily.   nitroGLYCERIN  (NITROSTAT ) 0.4 MG SL tablet Place 1 tablet (0.4 mg total) under the tongue every 5 (five) minutes as needed for chest pain.   omega-3 acid ethyl esters (LOVAZA ) 1 g capsule TAKE 1 CAPSULE TWICE A DAY   omeprazole  (PRILOSEC) 20 MG capsule TAKE 1 CAPSULE DAILY AS NEEDED FOR ACID REFLUX   predniSONE  (STERAPRED UNI-PAK 21 TAB) 10 MG (21) TBPK tablet Take as directed   Probiotic Product (PROBIOTIC PO) Take 1 capsule by mouth daily.   No facility-administered encounter medications on file as of 04/15/2024.    Past Surgical History:  Procedure Laterality Date   ANTERIOR CERVICAL DECOMP/DISCECTOMY FUSION N/A 09/22/2012   Procedure: ANTERIOR CERVICAL DECOMPRESSION/DISCECTOMY FUSION 2 LEVELS;  Surgeon: Catalina CHRISTELLA Stains, MD;  Location: [facility];  Service: Neurosurgery;  Laterality: N/A;  Cervical five-six Cervical six-seven Anterior cervical decompression/diskectomy/fusion   ATRIAL FLUTTER ABLATION  12/23/2012   CTI ablation by Dr Kelsie   ATRIAL  FLUTTER ABLATION N/A 12/23/2012   Procedure: ATRIAL FLUTTER ABLATION;  Surgeon: Lynwood Kelsie, MD;  Location: [facility] CATH LAB;  Service: Cardiovascular;  Laterality: N/A;   CARDIAC CATHETERIZATION N/A 11/16/2015   Procedure: Left Heart Cath and Coronary Angiography;  Surgeon: Alm LELON Clay, MD;  Location: [facility] INVASIVE CV LAB;  Service: Cardiovascular;  Laterality: N/A;   CARDIOVERSION N/A 09/23/2012   Procedure: CARDIOVERSION;  Surgeon: Debby JAYSON Core, MD;  Location: [facility] OR;  Service: Cardiovascular;  Laterality: N/A;   POSTERIOR CERVICAL FUSION/FORAMINOTOMY N/A 07/19/2021   Procedure: Cervical five- six Cervical six-seven Lateral Mass  Fusion;  Surgeon: Lanis Pupa, MD;  Location: [facility] OR;  Service: Neurosurgery;  Laterality: N/A;   TONSILLECTOMY  1973   TREATMENT FISTULA ANAL  ~ 2007   WISDOM TOOTH EXTRACTION      Family History  Problem Relation Age of Onset   Lung cancer Mother        died @ 45   Brain cancer Father        died @ 83   Diabetes Maternal Uncle    Diabetes Maternal Grandfather       Controlled substance contract: n/a     Review of Systems  Constitutional:  Negative for diaphoresis.  Eyes:  Negative for pain.  Respiratory:  Negative for shortness of breath.   Cardiovascular:  Negative for chest pain, palpitations and leg swelling.  Gastrointestinal:  Negative for abdominal pain.  Endocrine: Negative for polydipsia.  Skin:  Negative for rash.  Neurological:  Negative for dizziness, weakness and headaches.  Hematological:  Does not bruise/bleed easily.  All other systems reviewed and are negative.      Objective:   Physical Exam Vitals and nursing note reviewed.  Constitutional:      Appearance: Normal appearance. He is well-developed.  HENT:     Head: Normocephalic.     Nose: Nose normal.     Mouth/Throat:     Mouth: Mucous membranes are moist.     Pharynx: Oropharynx is clear.     Comments: Erythematous pumps on uvula Eyes:     Pupils: Pupils are  equal, round, and reactive to light.  Neck:     Thyroid : No thyroid  mass or thyromegaly.     Vascular: No carotid bruit or JVD.     Trachea: Phonation normal.  Cardiovascular:     Rate and Rhythm: Normal rate and regular rhythm.  Pulmonary:     Effort: Pulmonary effort is normal. No respiratory distress.     Breath sounds: Normal breath sounds.  Abdominal:     General: Bowel sounds are normal.     Palpations: Abdomen is soft.     Tenderness: There is no abdominal tenderness.  Musculoskeletal:        General: Normal range of motion.     Cervical back: Normal range of motion and neck supple.  Lymphadenopathy:     Cervical: No cervical adenopathy.  Skin:    General: Skin is warm and dry.  Neurological:     Mental Status: He is alert and oriented to person, place, and time.  Psychiatric:        Behavior: Behavior normal.        Thought Content: Thought content normal.        Judgment: Judgment normal.    BP 138/76   Pulse 65   Temp 98.4 F (36.9 C) (Temporal)   Ht 5' 10 (1.778 m)   Wt 229 lb (103.9 kg)   SpO2 91%   BMI 32.86 kg/m    Hgba1c 6.0%       Assessment & Plan:  Benjamin Hale comes in today with chief complaint of medical management of chronic issues    Diagnosis and orders addressed:  1. Essential hypertension Low sodium diet - CBC with Differential/Platelet - CMP14+EGFR - amLODipine  (NORVASC ) 10 MG tablet; Take 1 tablet (10 mg total) by mouth daily.  Dispense: 90 tablet; Refill: 1 - lisinopril  (ZESTRIL ) 40 MG tablet; Take 1 tablet (40 mg total) by mouth daily.  Dispense: 90 tablet; Refill: 1 - metoprolol  tartrate (LOPRESSOR ) 25  MG tablet; Take 1 tablet (25 mg total) by mouth 2 (two) times daily.  Dispense: 180 tablet; Refill: 1  2. Diabetes mellitus treated with oral medication (HCC) Stricter carb counting Continue current meds - Bayer DCA Hb A1c Waived - glimepiride  (AMARYL ) 4 MG tablet; Take 1 tablet (4 mg total) by mouth daily before  breakfast.  Dispense: 90 tablet; Refill: 1 - insulin  glargine-yfgn (SEMGLEE , YFGN,) 100 UNIT/ML injection; Inject 0.34 mLs (34 Units total) into the skin daily.  Dispense: 30 mL; Refill: 1 - metFORMIN  (GLUCOPHAGE ) 1000 MG tablet; Take 1 tablet (1,000 mg total) by mouth 2 (two) times daily with a meal.  Dispense: 180 tablet; Refill: 1  3. Hyperlipidemia associated with type 2 diabetes mellitus (HCC) Low fat diet - Lipid panel - fenofibrate  (TRICOR ) 145 MG tablet; Take 1 tablet (145 mg total) by mouth daily.  Dispense: 90 tablet; Refill: 1  4. Gastroesophageal reflux disease without esophagitis Avoid spicy foods Do not eat 2 hours prior to bedtime - omeprazole  (PRILOSEC) 20 MG capsule; TAKE 1 CAPSULE DAILY AS NEEDED FOR ACID REFLUX  Dispense: 90 capsule; Refill: 1  5. Depression, major, single episode, moderate (HCC) Stress management - escitalopram  (LEXAPRO ) 10 MG tablet; TAKE 1 TABLET DAILY  Dispense: 90 tablet; Refill: 1  6. Restless leg syndrome  7. BMI 31.0-31.9,adult Discussed diet and exercise for person with BMI >25 Will recheck weight in 3-6 months   8. GAD (generalized anxiety disorder) Stress management - diazepam  (VALIUM ) 5 MG tablet; TAKE 1 TABLET EVERY 12 HOURS AS NEEDED FOR ANXIETY  Dispense: 90 tablet; Refill: 1   Labs pending Health Maintenance reviewed Diet and exercise encouraged  Follow up plan: 3 months   Mary-Margaret Gladis, [name]

## 2024-04-16 LAB — CBC WITH DIFFERENTIAL/PLATELET
Basophils Absolute: 0.1 x10E3/uL (ref 0.0–0.2)
Basos: 1 %
EOS (ABSOLUTE): 0.1 x10E3/uL (ref 0.0–0.4)
Eos: 2 %
Hematocrit: 42.6 % (ref 37.5–51.0)
Hemoglobin: 14.1 g/dL (ref 13.0–17.7)
Immature Grans (Abs): 0 x10E3/uL (ref 0.0–0.1)
Immature Granulocytes: 0 %
Lymphocytes Absolute: 1.6 x10E3/uL (ref 0.7–3.1)
Lymphs: 25 %
MCH: 28.6 pg (ref 26.6–33.0)
MCHC: 33.1 g/dL (ref 31.5–35.7)
MCV: 86 fL (ref 79–97)
Monocytes Absolute: 0.5 x10E3/uL (ref 0.1–0.9)
Monocytes: 8 %
Neutrophils Absolute: 4.1 x10E3/uL (ref 1.4–7.0)
Neutrophils: 64 %
Platelets: 256 x10E3/uL (ref 150–450)
RBC: 4.93 x10E6/uL (ref 4.14–5.80)
RDW: 13.2 % (ref 11.6–15.4)
WBC: 6.4 x10E3/uL (ref 3.4–10.8)

## 2024-04-16 LAB — LIPID PANEL
Cholesterol, Total: 110 mg/dL (ref 100–199)
HDL: 13 mg/dL — AB (ref >39)
LDL CALC COMMENT:: 8.5 ratio — AB (ref 0.0–5.0)
LDL Chol Calc (NIH): 25 mg/dL (ref 0–99)
Triglycerides: 530 mg/dL — AB (ref 0–149)
VLDL Cholesterol Cal: 72 mg/dL — AB (ref 5–40)

## 2024-04-16 LAB — CMP14+EGFR
ALT: 26 IU/L (ref 0–44)
AST: 23 IU/L (ref 0–40)
Albumin: 4.2 g/dL (ref 3.8–4.9)
Alkaline Phosphatase: 68 IU/L (ref 47–123)
BUN/Creatinine Ratio: 16 (ref 9–20)
BUN: 14 mg/dL (ref 6–24)
Bilirubin Total: 0.4 mg/dL (ref 0.0–1.2)
CO2: 20 mmol/L (ref 20–29)
Calcium: 8.7 mg/dL (ref 8.7–10.2)
Chloride: 100 mmol/L (ref 96–106)
Creatinine, Ser: 0.89 mg/dL (ref 0.76–1.27)
Globulin, Total: 2.5 g/dL (ref 1.5–4.5)
Glucose: 276 mg/dL — AB (ref 70–99)
Potassium: 4 mmol/L (ref 3.5–5.2)
Sodium: 137 mmol/L (ref 134–144)
Total Protein: 6.7 g/dL (ref 6.0–8.5)
eGFR: 99 mL/min/1.73 (ref >59)

## 2024-04-16 LAB — VITAMIN B12: Vitamin B-12: 1044 pg/mL (ref 232–1245)

## 2024-04-18 ENCOUNTER — Encounter: Payer: Self-pay | Admitting: Radiology

## 2024-04-18 ENCOUNTER — Ambulatory Visit: Payer: Self-pay | Admitting: Nurse Practitioner

## 2024-04-22 ENCOUNTER — Other Ambulatory Visit: Payer: Self-pay | Admitting: Nurse Practitioner

## 2024-04-22 MED ORDER — SILDENAFIL CITRATE 100 MG PO TABS: 100.0000 mg | ORAL_TABLET | ORAL | 1 refills | Status: AC | PRN

## 2024-04-22 NOTE — Progress Notes (Signed)
 Meds ordered this encounter  Medications   sildenafil  (VIAGRA ) 100 MG tablet    Sig: Take 1 tablet (100 mg total) by mouth as needed for erectile dysfunction.    Dispense:  20 tablet    Refill:  1    Supervising Provider:   MARYANNE CHEW A [id number]   Mary-Margaret Gladis, [name]

## 2024-05-09 ENCOUNTER — Other Ambulatory Visit: Payer: Self-pay | Admitting: Nurse Practitioner

## 2024-05-09 DIAGNOSIS — I10 Essential (primary) hypertension: Secondary | ICD-10-CM

## 2024-05-12 ENCOUNTER — Other Ambulatory Visit: Payer: Self-pay | Admitting: Nurse Practitioner

## 2024-05-12 DIAGNOSIS — E1169 Type 2 diabetes mellitus with other specified complication: Secondary | ICD-10-CM

## 2024-05-12 DIAGNOSIS — M79604 Pain in right leg: Secondary | ICD-10-CM

## 2024-05-17 ENCOUNTER — Telehealth: Payer: Self-pay | Admitting: Pharmacist

## 2024-05-17 DIAGNOSIS — E1169 Type 2 diabetes mellitus with other specified complication: Secondary | ICD-10-CM

## 2024-05-17 NOTE — Telephone Encounter (Signed)
 Patient would like to discuss T2DM regimen.  His Dexcom G7 is reading higher than A1c at last PCP visit.  He is interested in once weekly injectables.  Will reviewing medication adherence and devise a new plan of action.   F/u: appt with PharmD 06/21/24   Mliss Tarry Griffin, PharmD, BCACP, CPP Clinical Pharmacist, [facility]

## 2024-06-14 ENCOUNTER — Ambulatory Visit

## 2024-06-21 ENCOUNTER — Ambulatory Visit: Admitting: Pharmacist

## 2024-06-21 DIAGNOSIS — E119 Type 2 diabetes mellitus without complications: Secondary | ICD-10-CM

## 2024-06-21 NOTE — Progress Notes (Signed)
 "  06/21/2024 Name: Benjamin Hale MRN: [id number] DOB: 1965/06/25  Chief Complaint  Patient presents with   Diabetes    Benjamin Hale is a 59 y.o. year old male who was referred for medication management by their primary care provider, Gladis Mustard, [name]. They presented for a face to face visit today.   They were referred to the pharmacist by their PCP for assistance in managing diabetes    Subjective:  Care Team: Primary Care Provider: Gladis Mustard, [name]    Medication Access/Adherence  Current Pharmacy:  [facility] [place], [place] - [address] [place] [address] Phone: [phone] Fax: [phone]  [facility] - [place], [place] - [address] [place] [address] Phone: [phone] Fax: [phone]  [email or web address] - [place], [place] - [address] [place] [address] Phone: [phone] Fax: [phone]  [email or web address] - [facility] - [place], [place] - [address] [place] [address] Phone: [phone] Fax: [phone]   Patient reports affordability concern with their medications: No  Patient reports access/transportation concerns to their pharmacy: No  Patient reports adherence concerns with their medications:  No     Diabetes:  Current medications: basaglar , metformin , glimepiride  Medications tried in the past: janumet , rybelsus   Current glucose readings: : Dexcom seems to be doing better BGs reading since 200s-300s Set patient up with [facility] - 3 week follow up for dose titration 14 day Avg 251 GMI 9.3 High 89% TIR 11% Low 0% 30 day Avg 266 GMI 9.7 High 92% TIR 8% Low 0% Keep insulin /metformin , stop glimepiride  Start mounjaro  2.5 mg, adjust insulin  by 2 units if going low Recommend diet and exercise - potatoes are splurge  sample Lot [id number] C EXP 12/10/25  Date of Download: 06/21/24 % Time CGM is active: 97.5% Average Glucose: 251 mg/dL Glucose Management Indicator: 9.3%  Glucose Variability: 22.3 (goal <36%) Time in Goal:  - Time in range 70-180: 11% - Time above range: 89% - Time below range: 0% Observed patterns:     Current meal patterns:  Discussed meal planning options and Plate method for healthy eating Avoid sugary drinks and desserts Incorporate balanced protein, non starchy veggies, 1 serving of carbohydrate with each meal Increase water intake Increase physical activity as able  Current physical activity: encouraged  Current medication access support: n/a  Macrovascular and Microvascular Risk Reduction:  Statin? no; patient previously intolerant to statin therapy; ACEi/ARB? yes (lisinopril ) Last urinary albumin/creatinine ratio:  Lab Results  Component Value Date   MICRALBCREAT 9 08/27/2023   MICRALBCREAT 8 08/19/2022   MICRALBCREAT 7 10/02/2020   Last eye exam:  Lab Results  Component Value Date   HMDIABEYEEXA No Retinopathy 08/14/2022   Last foot exam: 04/15/2024 Tobacco Use:  Tobacco Use: Medium Risk (04/15/2024)   Patient History    Smoking Tobacco Use: Former    Smokeless Tobacco Use: Never    Passive Exposure: Not on file     Objective:  Lab Results  Component Value Date   HGBA1C 6.0 (H) 04/15/2024    Lab Results  Component Value Date   CREATININE 0.89 04/15/2024   BUN 14 04/15/2024   NA 137 04/15/2024   K 4.0 04/15/2024   CL 100 04/15/2024   CO2 20 04/15/2024    Lab Results  Component Value Date  CHOL 110 04/15/2024   HDL 13 (L) 04/15/2024   LDLCALC 25 04/15/2024   LDLDIRECT 62 12/05/2022   TRIG 530 (H) 04/15/2024   CHOLHDL 8.5 (H) 04/15/2024    Medications Reviewed Today   Medications were not reviewed in this encounter       Assessment/Plan:   Diabetes: - Currently uncontrolled; goal A1c <7%. Cardiorenal risk reduction has  opportunities for improvement.. Blood pressure is at goal <130/80. LDL is at goal.  - Reviewed long term cardiovascular and renal outcomes of uncontrolled blood sugar. and Reviewed goal A1c, goal fasting, and goal 2 hour post prandial glucose. Recommended to check glucose using G7 dexcom - Recommend to start Mounjaro  2.5mg  weekly; continue all other medications for now. . - Patient denies personal or family history of multiple endocrine neoplasia type 2, medullary thyroid  cancer; personal history of pancreatitis or gallbladder disease., Discussed side effects of gastrointestinal upset/nausea; eating smaller meals, avoiding high-fat foods, and remaining upright after eating may reduce nausea. Discussed that overeating is a major trigger of nausea with this class of medications, as often times patients will start to feel full sooner and may need to decrease portion sizes from what they were previously accustomed to.   Follow Up Plan: 42month.  Finnean Cerami Dattero Phiona Ramnauth, PharmD, BCACP, CPP Clinical Pharmacist, [facility]   "

## 2024-07-07 ENCOUNTER — Telehealth: Payer: Self-pay | Admitting: Nurse Practitioner

## 2024-07-07 ENCOUNTER — Telehealth: Payer: Self-pay

## 2024-07-07 ENCOUNTER — Telehealth: Payer: Self-pay | Admitting: Pharmacy Technician

## 2024-07-07 DIAGNOSIS — E119 Type 2 diabetes mellitus without complications: Secondary | ICD-10-CM

## 2024-07-07 MED ORDER — TIRZEPATIDE 5 MG/0.5ML ~~LOC~~ SOAJ: 5.0000 mg | SUBCUTANEOUS | 2 refills | Status: AC

## 2024-07-07 NOTE — Telephone Encounter (Signed)
 MOUNJARO  5 MG/0.5ML Pen   Pharmacy comment: Alternative Requested:PRIOR AUTH REQUIRED

## 2024-07-07 NOTE — Progress Notes (Addendum)
" ° °  07/07/2024 Name: Benjamin Hale MRN: [id number] DOB: Feb 07, 1966  Patient is appearing for a follow-up visit with the population health pharmacy technician. Last engaged with the clinical pharmacist to discuss diabetes on 05/17/2024.  Plan from last clinical pharmacist appointment: Patient would like to discuss T2DM regimen.  His Dexcom G7 is reading higher than A1c at last PCP visit.  He is interested in once weekly injectables.  Will reviewing medication adherence and devise a new plan of action.  F/u: appt with PharmD 1/6/2(copy/paste from last note)   Medication Adherence Barriers Identified:  Was following up with patient's wife and she inquired about her husband needing a follow up appointment with PharmD regarding Mounjaro  HIPAA was verified. Patient's wife informs paitent has one week of Mounjaro  left and he was to check in with PharmD via telephone call to discuss how this treatment has been going and the potential increase in Mounjaro  dose. Was able to schedule patient a follow up with clinic PharmD Called patient to confirm his appointment date and time as below with PharmD. He had called into [facility] requesting an appointment with PharmD. Patient apologized for the confusion and has since spoken to his wife and he informs he wil be available for pharmacist phone call.  Next clinical pharmacist appointment is scheduled for: 07/12/2024 at 11am via phone call outreach  Kate Caddy, CPhT [facility] Office: [phone] Email: [email or web address]   "

## 2024-07-07 NOTE — Telephone Encounter (Signed)
 Please review and advise.

## 2024-07-07 NOTE — Telephone Encounter (Unsigned)
 Copied from CRM #[id number]. Topic: Clinical - Medication Question >> Jul 07, 2024  8:44 AM Alfonso ORN wrote: Reason for CRM: patient  message to Mliss( pharmacist )patient stated he take his last shot next tuesday , and want to know if want to do a followup how things are going

## 2024-07-11 ENCOUNTER — Other Ambulatory Visit (HOSPITAL_COMMUNITY): Payer: Self-pay

## 2024-07-11 ENCOUNTER — Telehealth: Payer: Self-pay

## 2024-07-11 NOTE — Telephone Encounter (Signed)
 Please complete PA for mounjaro  Patient has tried and failed : Januvia toujeo  glipizide metformin  trulicity Please reach out if additional details are needed prior to submission  Thanks!

## 2024-07-11 NOTE — Progress Notes (Unsigned)
 "  07/12/2024 Name: Benjamin Hale MRN: [id number] DOB: [birth year]  Chief Complaint  Patient presents with   Diabetes   [name] is a 59 y.o. year old male who presented for a telephone visit. They were referred to the pharmacist by their PCP for assistance in managing diabetes.   Subjective:  Patient was previously seen by PharmD on 06/21/24 at which time patients blood sugars were uncontrolled per Dexcom G7. Readings were in 200s-300s despite recent A1c of 6.0%. Patient was provided with sample of Mounjaro  2.5 mg. Prior authorization was submitted to insurance.  Patient presents for his telephone visit for diabetes medication management. Reports to be doing well. Endorses a change in blood sugars. Continues to still experience elevated fasting blood sugars around 200-250s. Denies eating snacks after dinner. States no matter what he eats, blood sugar will always increase. Mounjaro  has been curbing his appetite. Typically will only eat lunch (if able) and dinner. Denies sugary drinks and only drinks water. Denies hypoglycemia or hyperglycemia symptoms. Reviewed Dexcom G7 readings with patient.  Care Team: Primary Care Provider: Gladis Mustard, [name] ; Next Scheduled Visit: 07/14/24  Medication Access/Adherence  Current Pharmacy:  [facility] - [place], [place] - [address] [place] [address] Phone: [phone] Fax: [phone]  [facility] - [place], [place] - [address] [place] [address] Phone: [phone] Fax: [phone]  [email or web address] - [facility] - [place], [place] - [address] [place] [address] Phone: [phone] Fax: [phone]  [email or web address] - [facility] - [place], [place] - [address] [place] [address] Phone: [phone] Fax:  [phone]  [id number] - [place], [place] - [address] [place] [address] Phone: [phone] Fax: [phone]  Patient reports affordability concerns with their medications: No  Patient reports access/transportation concerns to their pharmacy: No  Patient reports adherence concerns with their medications:  No   Diabetes: Current medications: Basaglar  40 units daily, metformin  1000 mg BID, Mounjaro  2.5 mg weekly Medications tried in the past: Janumet , Rybelsus , Glimepiride  Statin:  Intolerant to statin therapy LDL of 25 on 04/15/24 - due for lipid panel ACEi/ARB Lisinopril  40 mg daily UACR of 9 on 08/27/23 - due for UACR  A1c of 6.0% on 04/15/24, down from 7.7% on 12/11/23  Using Dexcom G7 CGM; testing daily Date of Download: 07/13/23, 30 day report % Time CGM is active: not provided Average Glucose: 214 mg/dL Glucose Management Indicator: 8.4  Glucose Variability: not provided Time in Goal:  - Time in range 70-180: 25% - Time above range: 75% - Time below range: 0% Observed patterns: Reported elevated FBG of 200s-250s  Patient denies hypoglycemic s/sx including dizziness, shakiness, sweating.  Patient denies hyperglycemic symptoms including polyuria, polydipsia, polyphagia, nocturia, neuropathy, blurred vision.  Current meal patterns:  Discussed meal planning options and Plate method for healthy eating Avoid sugary drinks and desserts Incorporate balanced protein, non starchy veggies, 1 serving of carbohydrate with each meal Increase water intake  Current physical activity: Increase physical activity as able  Current medication access support: [employer], PA submitted for Mounjaro   Objective:  Lab Results  Component Value Date   HGBA1C 6.0 (H) 04/15/2024    Lab Results  Component Value Date   CREATININE 0.89 04/15/2024   BUN 14 04/15/2024   NA 137  04/15/2024   K 4.0 04/15/2024   CL 100 04/15/2024   CO2 20 04/15/2024     Lab Results  Component Value Date   CHOL 110 04/15/2024   HDL 13 (L) 04/15/2024   LDLCALC 25 04/15/2024   LDLDIRECT 62 12/05/2022   TRIG 530 (H) 04/15/2024   CHOLHDL 8.5 (H) 04/15/2024    Medications Reviewed Today     Reviewed by Mamie Jenkins HERO, RPH (Pharmacist) on 07/12/24 at 1118  Med List Status: <None>   Medication Order Taking? Sig Documenting Provider Last Dose Status Informant  albuterol  (VENTOLIN  HFA) 108 (90 Base) MCG/ACT inhaler [id number] Yes Inhale 2 puffs into the lungs every 6 (six) hours as needed for wheezing or shortness of breath. [name], William C, PA-C  Active   ALPHA LIPOIC ACID PO [id number] Yes Take by mouth. [provider]  Active   amLODipine  (NORVASC ) 10 MG tablet [id number] Yes Take 1 tablet (10 mg total) by mouth daily. Gladis, Mary-Margaret, [name]  Active   aspirin  EC 81 MG tablet [id number] Yes Take 81 mg by mouth daily. [provider]  Active Self   Patient not taking:   Discontinued 07/12/24 1116 (Patient Preference)   Continuous Glucose Receiver (DEXCOM G7 RECEIVER) DEVI [id number]  1 each by Does not apply route daily. Gladis Mustard, [name]  Active   Continuous Glucose Sensor (DEXCOM G7 SENSOR) [place] [id number] Yes CHANGE SENSOR EVERY 10 DAYS Gladis, Mary-Margaret, [name]  Active   cyclobenzaprine  (FLEXERIL ) 5 MG tablet [id number] Yes Take 1 tablet (5 mg total) by mouth 3 (three) times daily as needed for muscle spasms. Gladis, Mary-Margaret, [name]  Active   diazepam  (VALIUM ) 5 MG tablet [id number] Yes TAKE 1 TABLET EVERY 12 HOURS AS NEEDED FOR ANXIETY Gladis, Mary-Margaret, [name]  Active   escitalopram  (LEXAPRO ) 10 MG tablet [id number]  Take 1 tablet (10 mg total) by mouth daily. Gladis, Mary-Margaret, [name]  Active   fenofibrate  (TRICOR ) 145 MG tablet [id number] Yes Take 1 tablet by mouth daily. Gladis, Mary-Margaret, [name]  Active   fluticasone  (FLONASE ) 50 MCG/ACT nasal spray [id number] Yes Place 1 spray into both nostrils daily. Gladis,  Mary-Margaret, [name]  Active   glimepiride  (AMARYL ) 4 MG tablet [id number]  Take 1 tablet (4 mg total) by mouth daily before breakfast.  Patient not taking: Reported on 07/12/2024   Gladis Mustard, [name]  Active   ibuprofen (ADVIL,MOTRIN) 200 MG tablet [id number] Yes Take 600 mg by mouth every 6 (six) hours as needed (For headache or pain.). [provider]  Active Self  Insulin  Glargine (BASAGLAR  KWIKPEN) 100 UNIT/ML [id number] Yes Inject 40 Units into the skin daily. Gladis, Mary-Margaret, [name]  Active   Insulin  Pen Needle (EMBECTA PEN NEEDLE ULTRAFINE) 32G X 6 MM MISC [id number]  UAD to give Semglee  Dx E11.9 Gladis Mustard, [name]  Active   lisinopril  (ZESTRIL ) 40 MG tablet [id number] Yes Take 1 tablet (40 mg total) by mouth daily. Gladis, Mary-Margaret, [name]  Active   metFORMIN  (GLUCOPHAGE ) 1000 MG tablet [id number] Yes Take 1 tablet (1,000 mg total) by mouth 2 (two) times daily with a meal. Gladis, Mary-Margaret, [name]  Active   metoprolol  tartrate (LOPRESSOR ) 25 MG tablet [id number] Yes TAKE 1 TABLET TWICE A DAY [name], Mary-Margaret, [name]  Active   Multiple Vitamin (MULTIVITAMIN WITH MINERALS) TABS tablet [id number] Yes Take 1 tablet by mouth daily. [provider]  Active Self  nitroGLYCERIN  (NITROSTAT ) 0.4 MG SL tablet [id number] Yes Place 1 tablet (0.4 mg total) under the tongue every 5 (five) minutes  as needed for chest pain. Kilroy, Luke K, PA-C  Active Self  omega-3 acid ethyl esters (LOVAZA ) 1 g capsule [id number] Yes Take 1 capsule by mouth twice daily. Gladis, Mary-Margaret, [name]  Active   omeprazole  (PRILOSEC) 20 MG capsule [id number] Yes TAKE 1 CAPSULE DAILY AS NEEDED FOR ACID REFLUX Gladis Mary-Margaret, [name]  Active   Probiotic Product (PROBIOTIC PO) [id number] Yes Take 1 capsule by mouth daily. [provider]  Active Self  sildenafil  (VIAGRA ) 100 MG tablet [id number] Yes Take 1 tablet (100 mg total) by mouth as needed for erectile dysfunction. Gladis, Mary-Margaret,  [name]  Active   tirzepatide  (MOUNJARO ) 5 MG/0.5ML Pen [id number] Yes Inject 5 mg into the skin once a week. Gladis Mustard, [name]  Active             Assessment/Plan:  Diabetes: Currently controlled. Goal of <7% Given patients recent increase in blood sugars, A1c is likely higher than previous A1c of 6.0%. Reported GMI of 8.4%. Patient is experiencing elevated FBG readings 200-250s. Cardiorenal risk reduction is is optimized. Blood pressure is not at goal of <130/80 mmHg LDL is at goal of 70 mg/dL Reviewed long term cardiovascular and renal outcomes of uncontrolled blood sugar Reviewed goal A1c, goal fasting, and goal 2 hour post prandial glucose Reviewed dietary modifications including reducing fast food and carbohydrates (biscuits, potatoes, bread, pasta) Reviewed lifestyle modifications including: increase as able Recommend to:  Continue Mounjaro  2.5 mg weekly, THEN increase to Mounjaro  5 mg weekly Continue current regimen of Basaglar  40 units at bedtime and metformin  1000 mg BID with meals Recommend to check glucose daily with Dexcom G7 Future Considerations:  Consider Mounjaro  titration Consider reducing Basaglar  pending stabilization of blood sugars and Mounjaro  dose Discussed side effects of gastrointestinal upset/nausea; eating smaller meals, avoiding high-fat foods, and remaining upright after eating may reduce nausea. Discussed that overeating is a major trigger of nausea with this class of medications, as often times patients will start to feel full sooner and may need to decrease portion sizes from what they were previously accustomed to.   Follow Up Plan:  PharmD on 08/09/24  Jenkins Graces, PharmD PGY1 Pharmacy Resident   Mliss Tarry Griffin, PharmD, BCACP, CPP Clinical Pharmacist, [facility]     "

## 2024-07-11 NOTE — Telephone Encounter (Signed)
 Pharmacy Patient Advocate Encounter   Received notification from Pt Calls Messages that prior authorization for Mounjaro  5mg /0.19ml is required/requested.   Insurance verification completed.   The patient is insured through [facility].   Per test claim: PA required; PA submitted to above mentioned insurance via Latent Key/confirmation #/EOC [id number] Status is pending

## 2024-07-12 ENCOUNTER — Other Ambulatory Visit: Payer: Self-pay | Admitting: Pharmacist

## 2024-07-12 DIAGNOSIS — E119 Type 2 diabetes mellitus without complications: Secondary | ICD-10-CM

## 2024-07-13 ENCOUNTER — Other Ambulatory Visit (HOSPITAL_COMMUNITY): Payer: Self-pay

## 2024-07-13 NOTE — Telephone Encounter (Signed)
 Addressed in new encounter.

## 2024-07-14 ENCOUNTER — Ambulatory Visit: Admitting: Nurse Practitioner

## 2024-07-14 ENCOUNTER — Encounter: Payer: Self-pay | Admitting: Nurse Practitioner

## 2024-07-14 VITALS — BP 147/75 | HR 67 | Temp 97.2°F | Ht 70.0 in | Wt 222.0 lb

## 2024-07-14 DIAGNOSIS — I10 Essential (primary) hypertension: Secondary | ICD-10-CM

## 2024-07-14 DIAGNOSIS — E119 Type 2 diabetes mellitus without complications: Secondary | ICD-10-CM

## 2024-07-14 DIAGNOSIS — Z7984 Long term (current) use of oral hypoglycemic drugs: Secondary | ICD-10-CM

## 2024-07-14 DIAGNOSIS — E785 Hyperlipidemia, unspecified: Secondary | ICD-10-CM

## 2024-07-14 DIAGNOSIS — K219 Gastro-esophageal reflux disease without esophagitis: Secondary | ICD-10-CM

## 2024-07-14 DIAGNOSIS — E1169 Type 2 diabetes mellitus with other specified complication: Secondary | ICD-10-CM

## 2024-07-14 DIAGNOSIS — G2581 Restless legs syndrome: Secondary | ICD-10-CM | POA: Diagnosis not present

## 2024-07-14 DIAGNOSIS — Z6831 Body mass index (BMI) 31.0-31.9, adult: Secondary | ICD-10-CM

## 2024-07-14 DIAGNOSIS — F321 Major depressive disorder, single episode, moderate: Secondary | ICD-10-CM

## 2024-07-14 LAB — BAYER DCA HB A1C WAIVED: HB A1C (BAYER DCA - WAIVED): 7.3 % — ABNORMAL HIGH (ref 4.8–5.6)

## 2024-07-14 MED ORDER — ESCITALOPRAM OXALATE 10 MG PO TABS: 10.0000 mg | ORAL_TABLET | Freq: Every day | ORAL | 1 refills | Status: AC

## 2024-07-14 MED ORDER — METFORMIN HCL 1000 MG PO TABS: 1000.0000 mg | ORAL_TABLET | Freq: Two times a day (BID) | ORAL | 1 refills | Status: AC

## 2024-07-14 MED ORDER — OMEGA-3-ACID ETHYL ESTERS 1 G PO CAPS: 1.0000 | ORAL_CAPSULE | Freq: Two times a day (BID) | ORAL | 1 refills | Status: AC

## 2024-07-14 MED ORDER — METOPROLOL TARTRATE 25 MG PO TABS: 25.0000 mg | ORAL_TABLET | Freq: Two times a day (BID) | ORAL | 1 refills | Status: AC

## 2024-07-14 MED ORDER — FENOFIBRATE 145 MG PO TABS: 145.0000 mg | ORAL_TABLET | Freq: Every day | ORAL | 1 refills | Status: AC

## 2024-07-14 MED ORDER — OMEPRAZOLE 20 MG PO CPDR: DELAYED_RELEASE_CAPSULE | ORAL | 1 refills | Status: AC

## 2024-07-14 MED ORDER — GLIMEPIRIDE 4 MG PO TABS: 4.0000 mg | ORAL_TABLET | Freq: Every day | ORAL | 1 refills | Status: DC

## 2024-07-14 MED ORDER — LISINOPRIL 40 MG PO TABS: 40.0000 mg | ORAL_TABLET | Freq: Every day | ORAL | 1 refills | Status: AC

## 2024-07-14 MED ORDER — AMLODIPINE BESYLATE 10 MG PO TABS: 10.0000 mg | ORAL_TABLET | Freq: Every day | ORAL | 1 refills | Status: AC

## 2024-07-14 NOTE — Progress Notes (Signed)
 "  Subjective:    Patient ID: Benjamin Hale, male    DOB: April 06, 1966, 59 y.o.   MRN: [id number]   Chief Complaint: medical management of chronic issues     HPI:  Benjamin Hale is a 59 y.o. who identifies as a male who was assigned male at birth.   Social history: Lives with: wife and son Work history: [employer]   Comes in today for follow up of the following chronic medical issues:  1. Essential hypertension No c/o chest pain, sob or headache. Does not check blood pressure at home. BP Readings from Last 3 Encounters:  04/15/24 138/76  12/11/23 126/78  08/27/23 136/82     2. Diabetes mellitus treated with oral medication (HCC) Fasting blood sugars are running around 160-200 . Blood sugars seem to spike throughout the day. He has been seeing the clinical pharmacist  and meds have been changed. He is now on mounjario, amaryl  and metformin .  Lab Results  Component Value Date   HGBA1C 6.0 (H) 04/15/2024     3. Hyperlipidemia associated with type 2 diabetes mellitus (HCC) He does not really wathc his diet and does no dedicated exercise. Lab Results  Component Value Date   CHOL 110 04/15/2024   HDL 13 (L) 04/15/2024   LDLCALC 25 04/15/2024   LDLDIRECT 62 12/05/2022   TRIG 530 (H) 04/15/2024   CHOLHDL 8.5 (H) 04/15/2024   The ASCVD Risk score (Arnett DK, et al., 2019) failed to calculate for the following reasons:   The valid HDL cholesterol range is 20 to 100 mg/dL   The valid total cholesterol range is 130 to 320 mg/dL   4. Gastroesophageal reflux disease without esophagitis Is on omeprazole  and is doing well.  5. Depression, major, single episode, moderate (HCC) Is on lexapro  and has no issues.    04/15/2024    3:24 PM 12/11/2023    9:28 AM 06/08/2023    8:24 AM  Depression screen PHQ 2/9  Decreased Interest 0 0 0  Down, Depressed, Hopeless 0 0 0  PHQ - 2 Score 0 0 0  Altered sleeping 0 0   Tired, decreased energy 0 0   Change in appetite 0 0   Feeling  bad or failure about yourself  0 0   Trouble concentrating 0 0   Moving slowly or fidgety/restless 0 0   Suicidal thoughts 0 0   PHQ-9 Score 0  0    Difficult doing work/chores Not difficult at all Not difficult at all      Data saved with a previous flowsheet row definition       6. Restless leg syndrome Takes valium  on as needed basis  7. BMI 31.0-31.9,adult Weight is up 9lbs  Wt Readings from Last 3 Encounters:  07/14/24 222 lb (100.7 kg)  04/15/24 229 lb (103.9 kg)  12/11/23 220 lb (99.8 kg)   BMI Readings from Last 3 Encounters:  07/14/24 31.85 kg/m  04/15/24 32.86 kg/m  12/11/23 31.57 kg/m       New complaints: None  today  No Known Allergies Outpatient Encounter Medications as of 07/14/2024  Medication Sig   albuterol  (VENTOLIN  HFA) 108 (90 Base) MCG/ACT inhaler Inhale 2 puffs into the lungs every 6 (six) hours as needed for wheezing or shortness of breath.   ALPHA LIPOIC ACID PO Take by mouth.   amLODipine  (NORVASC ) 10 MG tablet Take 1 tablet (10 mg total) by mouth daily.   aspirin  EC 81 MG tablet Take 81  mg by mouth daily.   Continuous Glucose Receiver (DEXCOM G7 RECEIVER) DEVI 1 each by Does not apply route daily.   Continuous Glucose Sensor (DEXCOM G7 SENSOR) MISC CHANGE SENSOR EVERY 10 DAYS   cyclobenzaprine  (FLEXERIL ) 5 MG tablet Take 1 tablet (5 mg total) by mouth 3 (three) times daily as needed for muscle spasms.   diazepam  (VALIUM ) 5 MG tablet TAKE 1 TABLET EVERY 12 HOURS AS NEEDED FOR ANXIETY   escitalopram  (LEXAPRO ) 10 MG tablet Take 1 tablet (10 mg total) by mouth daily.   fenofibrate  (TRICOR ) 145 MG tablet Take 1 tablet by mouth daily.   fluticasone  (FLONASE ) 50 MCG/ACT nasal spray Place 1 spray into both nostrils daily.   glimepiride  (AMARYL ) 4 MG tablet Take 1 tablet (4 mg total) by mouth daily before breakfast. (Patient not taking: Reported on 07/12/2024)   ibuprofen (ADVIL,MOTRIN) 200 MG tablet Take 600 mg by mouth every 6 (six) hours as  needed (For headache or pain.).   Insulin  Glargine (BASAGLAR  KWIKPEN) 100 UNIT/ML Inject 40 Units into the skin daily.   Insulin  Pen Needle (EMBECTA PEN NEEDLE ULTRAFINE) 32G X 6 MM MISC UAD to give Semglee  Dx E11.9   lisinopril  (ZESTRIL ) 40 MG tablet Take 1 tablet (40 mg total) by mouth daily.   metFORMIN  (GLUCOPHAGE ) 1000 MG tablet Take 1 tablet (1,000 mg total) by mouth 2 (two) times daily with a meal.   metoprolol  tartrate (LOPRESSOR ) 25 MG tablet TAKE 1 TABLET TWICE A DAY   Multiple Vitamin (MULTIVITAMIN WITH MINERALS) TABS tablet Take 1 tablet by mouth daily.   nitroGLYCERIN  (NITROSTAT ) 0.4 MG SL tablet Place 1 tablet (0.4 mg total) under the tongue every 5 (five) minutes as needed for chest pain.   omega-3 acid ethyl esters (LOVAZA ) 1 g capsule Take 1 capsule by mouth twice daily.   omeprazole  (PRILOSEC) 20 MG capsule TAKE 1 CAPSULE DAILY AS NEEDED FOR ACID REFLUX   Probiotic Product (PROBIOTIC PO) Take 1 capsule by mouth daily.   sildenafil  (VIAGRA ) 100 MG tablet Take 1 tablet (100 mg total) by mouth as needed for erectile dysfunction.   tirzepatide  (MOUNJARO ) 5 MG/0.5ML Pen Inject 5 mg into the skin once a week.   No facility-administered encounter medications on file as of 07/14/2024.    Past Surgical History:  Procedure Laterality Date   ANTERIOR CERVICAL DECOMP/DISCECTOMY FUSION N/A 09/22/2012   Procedure: ANTERIOR CERVICAL DECOMPRESSION/DISCECTOMY FUSION 2 LEVELS;  Surgeon: Catalina CHRISTELLA Stains, MD;  Location: [facility];  Service: Neurosurgery;  Laterality: N/A;  Cervical five-six Cervical six-seven Anterior cervical decompression/diskectomy/fusion   ATRIAL FLUTTER ABLATION  12/23/2012   CTI ablation by Dr Kelsie   ATRIAL FLUTTER ABLATION N/A 12/23/2012   Procedure: ATRIAL FLUTTER ABLATION;  Surgeon: Lynwood Kelsie, MD;  Location: [facility] CATH LAB;  Service: Cardiovascular;  Laterality: N/A;   CARDIAC CATHETERIZATION N/A 11/16/2015   Procedure: Left Heart Cath and Coronary Angiography;   Surgeon: Alm LELON Clay, MD;  Location: [facility] INVASIVE CV LAB;  Service: Cardiovascular;  Laterality: N/A;   CARDIOVERSION N/A 09/23/2012   Procedure: CARDIOVERSION;  Surgeon: Debby JAYSON Core, MD;  Location: [facility] OR;  Service: Cardiovascular;  Laterality: N/A;   POSTERIOR CERVICAL FUSION/FORAMINOTOMY N/A 07/19/2021   Procedure: Cervical five- six Cervical six-seven Lateral Mass Fusion;  Surgeon: Lanis Pupa, MD;  Location: [facility] OR;  Service: Neurosurgery;  Laterality: N/A;   TONSILLECTOMY  1973   TREATMENT FISTULA ANAL  ~ 2007   WISDOM TOOTH EXTRACTION      Family History  Problem Relation Age of Onset   Lung cancer Mother        died @ 59   Brain cancer Father        died @ 79   Diabetes Maternal Uncle    Diabetes Maternal Grandfather       Controlled substance contract: n/a     Review of Systems  Constitutional:  Negative for diaphoresis.  Eyes:  Negative for pain.  Respiratory:  Negative for shortness of breath.   Cardiovascular:  Negative for chest pain, palpitations and leg swelling.  Gastrointestinal:  Negative for abdominal pain.  Endocrine: Negative for polydipsia.  Skin:  Negative for rash.  Neurological:  Negative for dizziness, weakness and headaches.  Hematological:  Does not bruise/bleed easily.  All other systems reviewed and are negative.      Objective:   Physical Exam Vitals and nursing note reviewed.  Constitutional:      Appearance: Normal appearance. He is well-developed.  HENT:     Head: Normocephalic.     Nose: Nose normal.     Mouth/Throat:     Mouth: Mucous membranes are moist.     Pharynx: Oropharynx is clear.     Comments: Erythematous pumps on uvula Eyes:     Pupils: Pupils are equal, round, and reactive to light.  Neck:     Thyroid : No thyroid  mass or thyromegaly.     Vascular: No carotid bruit or JVD.     Trachea: Phonation normal.  Cardiovascular:     Rate and Rhythm: Normal rate and regular rhythm.  Pulmonary:     Effort:  Pulmonary effort is normal. No respiratory distress.     Breath sounds: Normal breath sounds.  Abdominal:     General: Bowel sounds are normal.     Palpations: Abdomen is soft.     Tenderness: There is no abdominal tenderness.  Musculoskeletal:        General: Normal range of motion.     Cervical back: Normal range of motion and neck supple.  Lymphadenopathy:     Cervical: No cervical adenopathy.  Skin:    General: Skin is warm and dry.  Neurological:     Mental Status: He is alert and oriented to person, place, and time.  Psychiatric:        Behavior: Behavior normal.        Thought Content: Thought content normal.        Judgment: Judgment normal.    BP (!) 147/75   Pulse 67   Temp (!) 97.2 F (36.2 C) (Temporal)   Ht 5' 10 (1.778 m)   Wt 222 lb (100.7 kg)   BMI 31.85 kg/m      Hgba1c 7.3%       Assessment & Plan:   Benjamin Hale comes in today with chief complaint of medical management of chronic issues    Diagnosis and orders addressed:  1. Essential hypertension (Primary) Dash diet - amLODipine  (NORVASC ) 10 MG tablet; Take 1 tablet (10 mg total) by mouth daily.  Dispense: 90 tablet; Refill: 1 - lisinopril  (ZESTRIL ) 40 MG tablet; Take 1 tablet (40 mg total) by mouth daily.  Dispense: 90 tablet; Refill: 1 - metoprolol  tartrate (LOPRESSOR ) 25 MG tablet; Take 1 tablet (25 mg total) by mouth 2 (two) times daily.  Dispense: 180 tablet; Refill: 1 - CBC with Differential/Platelet - CMP14+EGFR  2. Diabetes mellitus treated with oral medication (HCC) Continue to wtahc carbs in diet Will talk with clinical pharmacist about  spikes in blood sugar - metFORMIN  (GLUCOPHAGE ) 1000 MG tablet; Take 1 tablet (1,000 mg total) by mouth 2 (two) times daily with a meal.  Dispense: 180 tablet; Refill: 1 - mounjario 0.5mg  weekly - Bayer DCA Hb A1c Waived  3. Hyperlipidemia associated with type 2 diabetes mellitus (HCC) Low fat diet - fenofibrate  (TRICOR ) 145 MG tablet;  Take 1 tablet (145 mg total) by mouth daily.  Dispense: 90 tablet; Refill: 1 - omega-3 acid ethyl esters (LOVAZA ) 1 g capsule; Take 1 capsule (1 g total) by mouth 2 (two) times daily.  Dispense: 180 capsule; Refill: 1 - Lipid panel  4. Gastroesophageal reflux disease without esophagitis Avoid spicy foods Do not eat 2 hours prior to bedtime  - omeprazole  (PRILOSEC) 20 MG capsule; TAKE 1 CAPSULE DAILY AS NEEDED FOR ACID REFLUX  Dispense: 90 capsule; Refill: 1  5. Depression, major, single episode, moderate (HCC) Stress management - escitalopram  (LEXAPRO ) 10 MG tablet; Take 1 tablet (10 mg total) by mouth daily.  Dispense: 90 tablet; Refill: 1  6. Restless leg syndrome Keep legs warm at night  7. BMI 31.0-31.9,adult Discussed diet and exercise for person with BMI >25 Will recheck weight in 3-6 months     Labs pending Health Maintenance reviewed Diet and exercise encouraged  Follow up plan: 3 months   Mary-Margaret Gladis, [name]  "

## 2024-07-15 ENCOUNTER — Ambulatory Visit: Payer: Self-pay | Admitting: Nurse Practitioner

## 2024-07-15 LAB — CBC WITH DIFFERENTIAL/PLATELET
Basophils Absolute: 0.1 10*3/uL (ref 0.0–0.2)
Basos: 1 %
EOS (ABSOLUTE): 0.2 10*3/uL (ref 0.0–0.4)
Eos: 2 %
Hematocrit: 42 % (ref 37.5–51.0)
Hemoglobin: 14.6 g/dL (ref 13.0–17.7)
Immature Grans (Abs): 0 10*3/uL (ref 0.0–0.1)
Immature Granulocytes: 0 %
Lymphocytes Absolute: 1.9 10*3/uL (ref 0.7–3.1)
Lymphs: 22 %
MCH: 29.1 pg (ref 26.6–33.0)
MCHC: 34.8 g/dL (ref 31.5–35.7)
MCV: 84 fL (ref 79–97)
Monocytes Absolute: 0.6 10*3/uL (ref 0.1–0.9)
Monocytes: 7 %
Neutrophils Absolute: 5.9 10*3/uL (ref 1.4–7.0)
Neutrophils: 68 %
Platelets: 266 10*3/uL (ref 150–450)
RBC: 5.01 x10E6/uL (ref 4.14–5.80)
RDW: 14.5 % (ref 11.6–15.4)
WBC: 8.8 10*3/uL (ref 3.4–10.8)

## 2024-07-15 LAB — CMP14+EGFR
ALT: 29 [IU]/L (ref 0–44)
AST: 34 [IU]/L (ref 0–40)
Albumin: 4.6 g/dL (ref 3.8–4.9)
Alkaline Phosphatase: 61 [IU]/L (ref 47–123)
BUN/Creatinine Ratio: 18 (ref 9–20)
BUN: 17 mg/dL (ref 6–24)
Bilirubin Total: 0.5 mg/dL (ref 0.0–1.2)
CO2: 18 mmol/L — ABNORMAL LOW (ref 20–29)
Calcium: 9.3 mg/dL (ref 8.7–10.2)
Chloride: 105 mmol/L (ref 96–106)
Creatinine, Ser: 0.94 mg/dL (ref 0.76–1.27)
Globulin, Total: 2.2 g/dL (ref 1.5–4.5)
Glucose: 145 mg/dL — ABNORMAL HIGH (ref 70–99)
Potassium: 4.4 mmol/L (ref 3.5–5.2)
Sodium: 139 mmol/L (ref 134–144)
Total Protein: 6.8 g/dL (ref 6.0–8.5)
eGFR: 94 mL/min/{1.73_m2}

## 2024-07-15 LAB — LIPID PANEL
Chol/HDL Ratio: 6.7 ratio — ABNORMAL HIGH (ref 0.0–5.0)
Cholesterol, Total: 120 mg/dL (ref 100–199)
HDL: 18 mg/dL — ABNORMAL LOW
LDL Chol Calc (NIH): 66 mg/dL (ref 0–99)
Triglycerides: 214 mg/dL — ABNORMAL HIGH (ref 0–149)
VLDL Cholesterol Cal: 36 mg/dL (ref 5–40)

## 2024-07-15 NOTE — Telephone Encounter (Signed)
 Pharmacy Patient Advocate Encounter  Received notification from [employer] that Prior Authorization for Mounjaro  5mg /0.64ml has been APPROVED from 07/14/24 to 07/13/25   PA #/Case ID/Reference #: [id number]  Approval letter indexed to media tab

## 2024-07-21 ENCOUNTER — Ambulatory Visit: Payer: Self-pay

## 2024-07-21 ENCOUNTER — Ambulatory Visit: Admitting: Nurse Practitioner

## 2024-07-21 ENCOUNTER — Encounter: Payer: Self-pay | Admitting: Nurse Practitioner

## 2024-07-21 VITALS — BP 137/68 | HR 61 | Ht 70.0 in | Wt 221.0 lb

## 2024-07-21 DIAGNOSIS — R051 Acute cough: Secondary | ICD-10-CM | POA: Diagnosis not present

## 2024-07-21 MED ORDER — AZITHROMYCIN 250 MG PO TABS: ORAL_TABLET | ORAL | 0 refills | Status: DC

## 2024-07-21 MED ORDER — AMOXICILLIN-POT CLAVULANATE 875-125 MG PO TABS: 1.0000 | ORAL_TABLET | Freq: Two times a day (BID) | ORAL | 0 refills | Status: AC

## 2024-07-21 MED ORDER — PROMETHAZINE-DM 6.25-15 MG/5ML PO SYRP: 5.0000 mL | ORAL_SOLUTION | Freq: Four times a day (QID) | ORAL | 0 refills | Status: AC | PRN

## 2024-07-21 NOTE — Telephone Encounter (Signed)
 Appointment scheduled.

## 2024-07-21 NOTE — Progress Notes (Signed)
 "  Subjective:    Patient ID: Benjamin Hale, male    DOB: [birth year], 59 y.o.   MRN: [id number]   Chief Complaint: Cough (Productive. Present for 1-2w.)   Cough This is a new problem. The current episode started 1 to 4 weeks ago. The problem has been gradually worsening. The cough is Productive of sputum. Associated symptoms include nasal congestion. Pertinent negatives include no ear congestion, ear pain, sore throat or shortness of breath. Nothing aggravates the symptoms. Treatments tried: nyquil, dayquil and advil. The treatment provided mild relief.    Patient Active Problem List   Diagnosis Date Noted   BMI 31.0-31.9,adult 08/27/2023   Diabetes mellitus treated with oral medication (HCC) 12/05/2022   Chronic left shoulder pain 01/07/2022   Pseudoarthrosis of cervical spine (HCC) 07/19/2021   Restless leg syndrome 01/01/2021   Cervical neuropathic pain 01/01/2021   Depression, major, single episode, moderate (HCC) 06/23/2019   Long-term current use of injectable noninsulin antidiabetic medication 05/01/2014   Essential hypertension 05/01/2014   Hyperlipidemia associated with type 2 diabetes mellitus (HCC) 03/27/2014   Asthma 03/27/2014   GERD (gastroesophageal reflux disease) 03/27/2014       Review of Systems  HENT:  Negative for ear pain and sore throat.   Respiratory:  Positive for cough. Negative for shortness of breath.        Objective:   Physical Exam Constitutional:      Appearance: Normal appearance.  HENT:     Right Ear: Tympanic membrane normal.     Left Ear: Tympanic membrane normal.     Nose: Congestion and rhinorrhea present.     Mouth/Throat:     Mouth: Mucous membranes are moist.  Cardiovascular:     Rate and Rhythm: Normal rate and regular rhythm.     Heart sounds: Normal heart sounds.  Pulmonary:     Effort: Pulmonary effort is normal.     Breath sounds: Normal breath sounds.  Musculoskeletal:     Cervical back: Normal range of motion  and neck supple.  Skin:    General: Skin is warm.  Neurological:     General: No focal deficit present.     Mental Status: He is alert and oriented to person, place, and time.  Psychiatric:        Mood and Affect: Mood normal.        Behavior: Behavior normal.    BP 137/68   Pulse 61   Ht 5' 10 (1.778 m)   Wt 221 lb (100.2 kg)   SpO2 97%   BMI 31.71 kg/m         Assessment & Plan:   Benjamin Hale in today with chief complaint of Cough (Productive. Present for 1-2w.)   1. Acute cough (Primary) 1. Take meds as prescribed 2. Use a cool mist humidifier especially during the winter months and when heat has been humid. 3. Use saline nose sprays frequently 4. Saline irrigations of the nose can be very helpful if done frequently.  * 4X daily for 1 week*  * Use of a nettie pot can be helpful with this. Follow directions with this* 5. Drink plenty of fluids 6. Keep thermostat turn down low 7.For any cough or congestion- promethazine  DM 8. For fever or aces or pains- take tylenol  or ibuprofen appropriate for age and weight.  * for fevers greater than 101 orally you may alternate ibuprofen and tylenol  every  3 hours.    Meds ordered this encounter  Medications  DISCONTD: azithromycin  (ZITHROMAX  Z-PAK) 250 MG tablet    Sig: As directed    Dispense:  6 tablet    Refill:  0    Supervising Provider:   DETTINGER, JOSHUA A [[id number]]   promethazine -dextromethorphan (PROMETHAZINE -DM) 6.25-15 MG/5ML syrup    Sig: Take 5 mLs by mouth 4 (four) times daily as needed.    Dispense:  118 mL    Refill:  0    Supervising Provider:   MARYANNE CHEW A [[id number]]   amoxicillin -clavulanate (AUGMENTIN ) 875-125 MG tablet    Sig: Take 1 tablet by mouth 2 (two) times daily.    Dispense:  14 tablet    Refill:  0    Cancel zpak please    Supervising Provider:   MARYANNE CHEW A [[id number]]     The above assessment and management plan was discussed with the patient. The patient  verbalized understanding of and has agreed to the management plan. Patient is aware to call the clinic if symptoms persist or worsen. Patient is aware when to return to the clinic for a follow-up visit. Patient educated on when it is appropriate to go to the emergency department.   Mary-Margaret Gladis, [name]   "

## 2024-07-21 NOTE — Telephone Encounter (Signed)
 FYI Only or Action Required?: FYI only for provider: pt requested appt, today.  Patient was last seen in primary care on 07/14/2024 by Gladis Mustard, [name].  Called Nurse Triage reporting Cough.  Symptoms began a week ago.  Interventions attempted: OTC medications: nyquil.  Symptoms are: gradually worsening.  Triage Disposition: Home Care  Patient/caregiver understands and will follow disposition?: No, wishes to speak with PCP  Reason for Triage: Patient has been experiencing headaches, sore throat, fever, and discolored mucus. Patient's mucus is a yellow/green. Patient's throat has some red and white stripes on it. Patient has been experiencing headaches every day this week. He states that his sinus pressure builds up to the headaches.   Reason for Disposition  Cough with cold symptoms (e.g., runny nose, postnasal drip, throat clearing)  Answer Assessment - Initial Assessment Questions 1. ONSET: When did the cough begin?      About a week ago 3. SPUTUM: Describe the color of your sputum (e.g., none, dry cough; clear, white, yellow, green)     Yellow/green 4. HEMOPTYSIS: Are you coughing up any blood? If Yes, ask: How much? (e.g., flecks, streaks, tablespoons, etc.)     Denies 5. DIFFICULTY BREATHING: Are you having difficulty breathing? If Yes, ask: How bad is it? (e.g., mild, moderate, severe)      Mild worse than noramal 6. FEVER: Do you have a fever? If Yes, ask: What is your temperature, how was it measured, and when did it start?     99 10. OTHER SYMPTOMS: Do you have any other symptoms? (e.g., runny nose, wheezing, chest pain)       Sore throat  Pt states that he gets a sinus infection every year around this time and is leaving for vacation. Pt requesting appt.  Protocols used: Cough - Acute Productive-A-AH

## 2024-07-21 NOTE — Patient Instructions (Signed)

## 2024-08-09 ENCOUNTER — Other Ambulatory Visit: Payer: Self-pay

## 2024-10-13 ENCOUNTER — Ambulatory Visit: Admitting: Nurse Practitioner
